# Patient Record
Sex: Male | Born: 1965 | Race: White | Hispanic: No | Marital: Married | Smoking: Never smoker
Health system: Southern US, Community
[De-identification: ages and names within clinical notes are randomized; demographics above are authoritative.]

## PROBLEM LIST (undated history)

## (undated) DIAGNOSIS — M1990 Unspecified osteoarthritis, unspecified site: Secondary | ICD-10-CM

## (undated) DIAGNOSIS — G4733 Obstructive sleep apnea (adult) (pediatric): Secondary | ICD-10-CM

## (undated) DIAGNOSIS — E7800 Pure hypercholesterolemia, unspecified: Secondary | ICD-10-CM

## (undated) DIAGNOSIS — E669 Obesity, unspecified: Secondary | ICD-10-CM

## (undated) DIAGNOSIS — I639 Cerebral infarction, unspecified: Secondary | ICD-10-CM

## (undated) DIAGNOSIS — E039 Hypothyroidism, unspecified: Secondary | ICD-10-CM

## (undated) DIAGNOSIS — R569 Unspecified convulsions: Secondary | ICD-10-CM

## (undated) DIAGNOSIS — N183 Chronic kidney disease, stage 3 (moderate): Secondary | ICD-10-CM

## (undated) DIAGNOSIS — I10 Essential (primary) hypertension: Secondary | ICD-10-CM

## (undated) DIAGNOSIS — M21372 Foot drop, left foot: Principal | ICD-10-CM

## (undated) DIAGNOSIS — G43909 Migraine, unspecified, not intractable, without status migrainosus: Secondary | ICD-10-CM

## (undated) DIAGNOSIS — E1122 Type 2 diabetes mellitus with diabetic chronic kidney disease: Secondary | ICD-10-CM

## (undated) DIAGNOSIS — Z9981 Dependence on supplemental oxygen: Secondary | ICD-10-CM

## (undated) DIAGNOSIS — I509 Heart failure, unspecified: Secondary | ICD-10-CM

## (undated) DIAGNOSIS — Z9989 Dependence on other enabling machines and devices: Secondary | ICD-10-CM

## (undated) DIAGNOSIS — E119 Type 2 diabetes mellitus without complications: Secondary | ICD-10-CM

## (undated) DIAGNOSIS — J189 Pneumonia, unspecified organism: Secondary | ICD-10-CM

## (undated) DIAGNOSIS — I2699 Other pulmonary embolism without acute cor pulmonale: Secondary | ICD-10-CM

## (undated) DIAGNOSIS — E1169 Type 2 diabetes mellitus with other specified complication: Secondary | ICD-10-CM

## (undated) HISTORY — DX: Other pulmonary embolism without acute cor pulmonale: I26.99

## (undated) HISTORY — DX: Pneumonia, unspecified organism: J18.9

## (undated) HISTORY — DX: Foot drop, left foot: M21.372

---

## 2015-02-22 DIAGNOSIS — J189 Pneumonia, unspecified organism: Secondary | ICD-10-CM

## 2015-02-22 HISTORY — DX: Pneumonia, unspecified organism: J18.9

## 2016-06-21 DIAGNOSIS — N183 Chronic kidney disease, stage 3 unspecified: Secondary | ICD-10-CM

## 2016-06-21 DIAGNOSIS — E1122 Type 2 diabetes mellitus with diabetic chronic kidney disease: Secondary | ICD-10-CM

## 2016-06-21 HISTORY — DX: Chronic kidney disease, stage 3 unspecified: N18.30

## 2016-06-21 HISTORY — DX: Chronic kidney disease, stage 3 unspecified: E11.22

## 2016-06-22 DIAGNOSIS — I10 Essential (primary) hypertension: Secondary | ICD-10-CM | POA: Diagnosis not present

## 2016-06-22 DIAGNOSIS — I509 Heart failure, unspecified: Secondary | ICD-10-CM | POA: Diagnosis not present

## 2016-06-22 DIAGNOSIS — N183 Chronic kidney disease, stage 3 (moderate): Secondary | ICD-10-CM | POA: Diagnosis not present

## 2016-06-22 DIAGNOSIS — E119 Type 2 diabetes mellitus without complications: Secondary | ICD-10-CM | POA: Diagnosis not present

## 2016-06-22 DIAGNOSIS — R0609 Other forms of dyspnea: Secondary | ICD-10-CM

## 2016-06-23 DIAGNOSIS — R0609 Other forms of dyspnea: Secondary | ICD-10-CM | POA: Diagnosis not present

## 2016-06-23 DIAGNOSIS — I509 Heart failure, unspecified: Secondary | ICD-10-CM | POA: Diagnosis not present

## 2016-06-23 DIAGNOSIS — E119 Type 2 diabetes mellitus without complications: Secondary | ICD-10-CM

## 2016-06-23 DIAGNOSIS — N183 Chronic kidney disease, stage 3 (moderate): Secondary | ICD-10-CM | POA: Diagnosis not present

## 2016-06-23 DIAGNOSIS — I10 Essential (primary) hypertension: Secondary | ICD-10-CM | POA: Diagnosis not present

## 2016-06-24 DIAGNOSIS — N183 Chronic kidney disease, stage 3 unspecified: Secondary | ICD-10-CM | POA: Insufficient documentation

## 2016-06-24 DIAGNOSIS — I5041 Acute combined systolic (congestive) and diastolic (congestive) heart failure: Secondary | ICD-10-CM | POA: Insufficient documentation

## 2016-06-24 DIAGNOSIS — E119 Type 2 diabetes mellitus without complications: Secondary | ICD-10-CM | POA: Insufficient documentation

## 2016-08-21 DIAGNOSIS — I509 Heart failure, unspecified: Secondary | ICD-10-CM

## 2016-08-21 HISTORY — DX: Heart failure, unspecified: I50.9

## 2016-12-22 HISTORY — PX: CARDIAC CATHETERIZATION: SHX172

## 2016-12-23 DIAGNOSIS — E785 Hyperlipidemia, unspecified: Secondary | ICD-10-CM | POA: Insufficient documentation

## 2016-12-23 DIAGNOSIS — E039 Hypothyroidism, unspecified: Secondary | ICD-10-CM | POA: Insufficient documentation

## 2017-08-14 ENCOUNTER — Ambulatory Visit (INDEPENDENT_AMBULATORY_CARE_PROVIDER_SITE_OTHER): Payer: BLUE CROSS/BLUE SHIELD | Admitting: Emergency Medicine

## 2017-08-14 ENCOUNTER — Encounter: Payer: Self-pay | Admitting: Emergency Medicine

## 2017-08-14 VITALS — BP 122/78 | HR 94 | Ht 71.0 in | Wt 311.0 lb

## 2017-08-14 DIAGNOSIS — R5383 Other fatigue: Secondary | ICD-10-CM

## 2017-08-14 DIAGNOSIS — R0609 Other forms of dyspnea: Secondary | ICD-10-CM

## 2017-08-14 DIAGNOSIS — E662 Morbid (severe) obesity with alveolar hypoventilation: Secondary | ICD-10-CM | POA: Diagnosis not present

## 2017-08-14 DIAGNOSIS — I10 Essential (primary) hypertension: Secondary | ICD-10-CM | POA: Diagnosis not present

## 2017-08-14 DIAGNOSIS — G4733 Obstructive sleep apnea (adult) (pediatric): Secondary | ICD-10-CM

## 2017-08-14 DIAGNOSIS — J9611 Chronic respiratory failure with hypoxia: Secondary | ICD-10-CM | POA: Diagnosis not present

## 2017-08-14 DIAGNOSIS — G4730 Sleep apnea, unspecified: Secondary | ICD-10-CM | POA: Insufficient documentation

## 2017-08-14 NOTE — Patient Instructions (Addendum)
We will arrange for pulmonary function testing in our office CXR today.  Please continue your oxygen at 3L/min  Continue to use your BiPAP mask every night Continue Breo one inhalation daily. Rinse and gargle after using.  Keep albuterol available to use 2 puffs as needed for shortness of breath.  Continue your heart medications as ordered. Follow with Dr Lamonte Sakai next available with full PFT same day.

## 2017-08-14 NOTE — Assessment & Plan Note (Signed)
On 3 L/min

## 2017-08-14 NOTE — Assessment & Plan Note (Signed)
On BiPAP that it sounds like an auto titration mode in which is been managed by his primary care physician.  May need a repeat sleep study to ensure adequate pressures although it sounds like this was done recently.  We will try to get these records.

## 2017-08-14 NOTE — Assessment & Plan Note (Signed)
Multifactorial.  He relates this to a hospitalization that was characterized by dyspnea and bilateral infiltrates approximately 1 year ago.  Unable to determine whether this was the acute inciting event or whether the presentation was actually more subacute and culminated in that hospitalization.  He was treated with antibiotics and also diuretics, prednisone.  Based on his overall condition and his other medical problems including his obesity hyperinflation syndrome, probable pulmonary hypertension, diastolic dysfunction I question whether those infiltrates may have actually been pulmonary edema.  He has a history that would suggest cor pulmonale.  He apparently had spirometry that showed mixed restriction and obstruction and has been tried on Breo with some positive results.  I will continue the [facility] for now, albuterol as needed.  Continue his current supplemental oxygen.  He may ultimately require repeat oxygen titration and an overnight oximetry.  He needs repeat pulmonary function testing to quantify his degree of obstruction.  Continue his current antihypertensive and diuretic medications as ordered.

## 2017-08-14 NOTE — Progress Notes (Signed)
Subjective:    Patient ID: Daniel [name], male    DOB: 12-04-1965, 52 y.o.   MRN: [id number]   HPI 51 year old morbidly obese man, never smoker with a history of obstructive sleep apnea on BiPAP on auto-titration, hypertension, hyperlipidemia, diabetes, CAD defined by cardiac cath in late 2018, managed medically.  He is referred today for continued evaluation of dyspnea and hypoxemia.  He has been on 3 L of oxygen at all times since a hospitalization for possible pneumonia, bilateral pulmonary infiltrates in [facility] 06/2016. He was treated with abx, prednisone at the time. These were apparently improved on repeat CT scanning 08/2016.  V/Q scan done 07/01/2016 was low probably for pulmonary embolism. He has a ventral hernia with some pain, ? Whether this is causing some restrictive component   He presents today for eval of his SOB. He complains of low energy with exertion. He gets very SOB when he bends over. He sleeps through most of the night on his BiPAP. He does fall asleep during the day.   He has been tried empirically on breo, albuterol in the past. He uses the [place] reliably, unsure whether it is helping him. He may get some relief from albuterol. He was started on armodafanil in [place] for low energy, possible narcolepsy??  Review of chart notes shows that he had spirometry performed in 07/28/2016 with an FEV1 2.19 L (60% predicted), FVC 2.78 L (62% predicted), ratio 79%.  Raw diffusion capacity 65% predicted.  Total lung capacity 90% predicted.    Review of Systems  Constitutional: Positive for fatigue. Negative for fever and unexpected weight change.  HENT: Positive for postnasal drip and sneezing. Negative for congestion, dental problem, ear pain, nosebleeds, rhinorrhea, sinus pressure, sore throat and trouble swallowing.   Eyes: Positive for redness and itching.  Respiratory: Positive for shortness of breath. Negative for cough, chest tightness and wheezing.   Cardiovascular:  Positive for leg swelling. Negative for palpitations.  Gastrointestinal: Negative for nausea and vomiting.  Genitourinary: Negative for dysuria.  Musculoskeletal: Negative for joint swelling.  Skin: Negative for rash.  Allergic/Immunologic: Positive for environmental allergies and immunocompromised state. Negative for food allergies.  Neurological: Negative for headaches.  Hematological: Does not bruise/bleed easily.  Psychiatric/Behavioral: Negative for dysphoric mood. The patient is nervous/anxious.    Past Medical History:  Diagnosis Date  . Pneumonia   . Pulmonary embolism ([place])   . Sleep apnea      Family History  Problem Relation Age of Onset  . Alpha-1 antitrypsin deficiency Neg Hx   . Asthma Neg Hx   . Breast cancer Neg Hx   . Stroke Neg Hx   . Colon cancer Neg Hx   . Coronary artery disease Neg Hx   . COPD Neg Hx   . Cystic fibrosis Neg Hx   . Diabetes Neg Hx   . Deep vein thrombosis Neg Hx   . Hypertension Neg Hx   . Hyperlipidemia Neg Hx   . Kidney disease Neg Hx   . Liver disease Neg Hx   . Lung cancer Neg Hx   . Lupus Neg Hx   . Neurofibromatosis Neg Hx   . Osteoporosis Neg Hx   . Ovarian cancer Neg Hx   . Positive PPD/TB Exposure Neg Hx   . Prostate cancer Neg Hx   . Pulmonary embolism Neg Hx   . Pulmonary fibrosis Neg Hx   . Rheum arthritis Neg Hx   . Sarcoidosis Neg Hx   .  Sleep apnea Neg Hx   . Thyroid disease Neg Hx   . Tuberculosis Neg Hx   . Tuberous sclerosis Neg Hx      Social History   Socioeconomic History  . Marital status: Married    Spouse name: Not on file  . Number of children: Not on file  . Years of education: Not on file  . Highest education level: Not on file  Occupational History  . Not on file  Social Needs  . Financial resource strain: Not on file  . Food insecurity:    Worry: Not on file    Inability: Not on file  . Transportation needs:    Medical: Not on file    Non-medical: Not on file  Tobacco Use  . Smoking  status: Never Smoker  . Smokeless tobacco: Former Systems developer    Types: Chew  Substance and Sexual Activity  . Alcohol use: Not on file  . Drug use: Not on file  . Sexual activity: Not on file  Lifestyle  . Physical activity:    Days per week: Not on file    Minutes per session: Not on file  . Stress: Not on file  Relationships  . Social connections:    Talks on phone: Not on file    Gets together: Not on file    Attends religious service: Not on file    Active member of club or organization: Not on file    Attends meetings of clubs or organizations: Not on file    Relationship status: Not on file  . Intimate partner violence:    Fear of current or ex partner: Not on file    Emotionally abused: Not on file    Physically abused: Not on file    Forced sexual activity: Not on file  Other Topics Concern  . Not on file  Social History Narrative  . Not on file  He is worked in the past with hazardous material cleaned up, is also worked on a farm in the past.  He has traveled in the past [place], [place], [place], [place], [place], [place].  No Known Allergies   Outpatient Medications Prior to Visit  Medication Sig Dispense Refill  . albuterol (PROVENTIL HFA) 108 (90 Base) MCG/ACT inhaler Inhale into the lungs.    . Armodafinil 250 MG tablet TAKE 1 TABLET BY MOUTH EVERY DAY    . aspirin EC 81 MG tablet Take by mouth.    . cycloSPORINE (RESTASIS) 0.05 % ophthalmic emulsion INSTILL 1 DROP IN BOTH EYES BID    . fluticasone furoate-vilanterol (BREO ELLIPTA) 100-25 MCG/INH AEPB Inhale into the lungs.    . hydrALAZINE (APRESOLINE) 100 MG tablet TAKE 1 TABLET BY MOUTH THREE TIMES A DAY    . hydrochlorothiazide (HYDRODIURIL) 25 MG tablet Take by mouth.    . insulin regular (HUMULIN R) 100 units/mL injection Inject into the skin.    Marland Kitchen levothyroxine (SYNTHROID, LEVOTHROID) 50 MCG tablet     . lisinopril (PRINIVIL,ZESTRIL) 10 MG tablet Take 10 mg by mouth daily.  11  . metFORMIN  (GLUCOPHAGE) 1000 MG tablet TAKE 1 TABLET BY MOUTH TWICE (2) DAILY  11  . torsemide (DEMADEX) 20 MG tablet TAKE 1 TABLET BY MOUTH EVERY DAY     No facility-administered medications prior to visit.         Objective:   Physical Exam Vitals:   08/14/17 1431  BP: 122/78  Pulse: 94  SpO2: 95%  Weight: (!) 311 lb (  141.1 kg)  Height: 5\' 11"  (1.803 m)   Gen: Pleasant, morbidly obese man, in no distress on nasal cannula oxygen,  normal affect  ENT: No lesions,  mouth clear,  oropharynx clear, no postnasal drip  Neck: No JVD,  very large neck, no stridor  Lungs: No use of accessory muscles, very distant, no wheezing, no crackles  Cardiovascular: RRR, heart sounds normal, no murmur or gallops, trace lower extremity edema  Musculoskeletal: No deformities, no cyanosis or clubbing  Neuro: alert, non focal  Skin: Warm, no lesions or rash    Assessment & Plan:  Sleep apnea On BiPAP that it sounds like an auto titration mode in which is been managed by his primary care physician.  May need a repeat sleep study to ensure adequate pressures although it sounds like this was done recently.  We will try to get these records.  Dyspnea on exertion Multifactorial.  He relates this to a hospitalization that was characterized by dyspnea and bilateral infiltrates approximately 1 year ago.  Unable to determine whether this was the acute inciting event or whether the presentation was actually more subacute and culminated in that hospitalization.  He was treated with antibiotics and also diuretics, prednisone.  Based on his overall condition and his other medical problems including his obesity hyperinflation syndrome, probable pulmonary hypertension, diastolic dysfunction I question whether those infiltrates may have actually been pulmonary edema.  He has a history that would suggest cor pulmonale.  He apparently had spirometry that showed mixed restriction and obstruction and has been tried on Breo with  some positive results.  I will continue the [facility] for now, albuterol as needed.  Continue his current supplemental oxygen.  He may ultimately require repeat oxygen titration and an overnight oximetry.  He needs repeat pulmonary function testing to quantify his degree of obstruction.  Continue his current antihypertensive and diuretic medications as ordered.  Chronic respiratory failure with hypoxia ([place]) On 3 L/min  Baltazar Apo, MD, PhD 08/14/2017, 5:17 PM [facility] [phone] or if no answer [phone]

## 2017-09-15 ENCOUNTER — Ambulatory Visit (INDEPENDENT_AMBULATORY_CARE_PROVIDER_SITE_OTHER): Payer: BLUE CROSS/BLUE SHIELD | Admitting: Emergency Medicine

## 2017-09-15 ENCOUNTER — Encounter: Payer: Self-pay | Admitting: Emergency Medicine

## 2017-09-15 DIAGNOSIS — J9611 Chronic respiratory failure with hypoxia: Secondary | ICD-10-CM | POA: Diagnosis not present

## 2017-09-15 DIAGNOSIS — E662 Morbid (severe) obesity with alveolar hypoventilation: Secondary | ICD-10-CM | POA: Diagnosis not present

## 2017-09-15 DIAGNOSIS — R0609 Other forms of dyspnea: Secondary | ICD-10-CM

## 2017-09-15 LAB — PULMONARY FUNCTION TEST
DL/VA % PRED: 130 %
DL/VA: 5.96 ml/min/mmHg/L
DLCO UNC % PRED: 103 %
DLCO unc: 32.21 ml/min/mmHg
FEF 25-75 POST: 3.87 L/s
FEF 25-75 PRE: 3.51 L/s
FEF2575-%CHANGE-POST: 10 %
FEF2575-%PRED-POST: 120 %
FEF2575-%Pred-Pre: 109 %
FEV1-%Change-Post: 8 %
FEV1-%PRED-POST: 82 %
FEV1-%Pred-Pre: 76 %
FEV1-PRE: 2.81 L
FEV1-Post: 3.05 L
FEV1FVC-%Change-Post: 0 %
FEV1FVC-%PRED-PRE: 110 %
FEV6-%Change-Post: 7 %
FEV6-%PRED-POST: 76 %
FEV6-%PRED-PRE: 71 %
FEV6-POST: 3.55 L
FEV6-Pre: 3.3 L
FEV6FVC-%PRED-POST: 104 %
FEV6FVC-%Pred-Pre: 104 %
FVC-%CHANGE-POST: 7 %
FVC-%PRED-POST: 73 %
FVC-%Pred-Pre: 68 %
FVC-Post: 3.55 L
FVC-Pre: 3.3 L
POST FEV6/FVC RATIO: 100 %
PRE FEV1/FVC RATIO: 85 %
Post FEV1/FVC ratio: 86 %
Pre FEV6/FVC Ratio: 100 %
RV % pred: 124 %
RV: 2.55 L
TLC % pred: 87 %
TLC: 5.92 L

## 2017-09-15 NOTE — Progress Notes (Signed)
Patient completed full PFT today. 

## 2017-09-15 NOTE — Patient Instructions (Addendum)
Congratulations on your weight loss.  Continue to try to work on slow steady weight loss as you are able.  This will help your breathing. Continue to use her BiPAP mask every night reliably. Continue to use your Demadex as ordered. Your pulmonary function testing does show some evidence for some asthma.  Continue Breo 1 inhalation once daily.  Rinse and gargle after using. Walking oximetry today on room air shows that your oxygen level stays in the safe range with exertion. We can consider stopping your oxygen.  Follow with Dr Lamonte Sakai in 6 months or sooner if you have any problems

## 2017-09-15 NOTE — Assessment & Plan Note (Signed)
Identified when he was hospitalized with B infiltrates. Repeat his walking oximetry today. He may be able to come off the O2.

## 2017-09-15 NOTE — Progress Notes (Signed)
Subjective:    Patient ID: Daniel [name], male    DOB: 1965/09/20, 52 y.o.   MRN: [id number]   HPI 52 year old morbidly obese man, never smoker with a history of obstructive sleep apnea on BiPAP on auto-titration, hypertension, hyperlipidemia, diabetes, CAD defined by cardiac cath in late 2018, managed medically.  He is referred today for continued evaluation of dyspnea and hypoxemia.  He has been on 3 L of oxygen at all times since a hospitalization for possible pneumonia, bilateral pulmonary infiltrates in [facility] 06/2016. He was treated with abx, prednisone at the time. These were apparently improved on repeat CT scanning 08/2016.  V/Q scan done 07/01/2016 was low probably for pulmonary embolism. He has a ventral hernia with some pain, ? Whether this is causing some restrictive component   He presents today for eval of his SOB. He complains of low energy with exertion. He gets very SOB when he bends over. He sleeps through most of the night on his BiPAP. He does fall asleep during the day.   He has been tried empirically on breo, albuterol in the past. He uses the [place] reliably, unsure whether it is helping him. He may get some relief from albuterol. He was started on armodafanil in [place] for low energy, possible narcolepsy??  Review of chart notes shows that he had spirometry performed in 07/28/2016 with an FEV1 2.19 L (60% predicted), FVC 2.78 L (62% predicted), ratio 79%.  Raw diffusion capacity 65% predicted.  Total lung capacity 90% predicted.  ROV 09/15/17 --this is a follow-up visit for 52 year old obese man, never smoker with obstructive sleep apnea that is managed with BiPAP.  He is under evaluation for multifactorial dyspnea.  Clinical history has been suggestive of possible secondary pulmonary hypertension with right heart dysfunction.  He is been on Breo for possible obstructive lung disease.  We repeated his pulmonary function testing today which shows evidence for mixed obstruction  and restriction.  His FEV1 is moderately reduced at 2.81 L or 76% of predicted.  This is a significant improvement compared with June 2018.  His diffusion capacity today was normal.  His lung volumes were normal as well, probably reflects pseudonormalization in the setting of coexisting obstruction. He has lost about 40 lbs. He uses his BiPAP reliably. He remains on Breo, has been on about 4-6 months. He uses albuterol prn - does seem to help his dyspnea.      Review of Systems  Constitutional: Positive for fatigue. Negative for fever and unexpected weight change.  HENT: Positive for postnasal drip and sneezing. Negative for congestion, dental problem, ear pain, nosebleeds, rhinorrhea, sinus pressure, sore throat and trouble swallowing.   Eyes: Positive for redness and itching.  Respiratory: Positive for shortness of breath. Negative for cough, chest tightness and wheezing.   Cardiovascular: Positive for leg swelling. Negative for palpitations.  Gastrointestinal: Negative for nausea and vomiting.  Genitourinary: Negative for dysuria.  Musculoskeletal: Negative for joint swelling.  Skin: Negative for rash.  Allergic/Immunologic: Positive for environmental allergies and immunocompromised state. Negative for food allergies.  Neurological: Negative for headaches.  Hematological: Does not bruise/bleed easily.  Psychiatric/Behavioral: Negative for dysphoric mood. The patient is nervous/anxious.    Past Medical History:  Diagnosis Date  . Pneumonia   . Pulmonary embolism ([place])   . Sleep apnea      Family History  Problem Relation Age of Onset  . Alpha-1 antitrypsin deficiency Neg Hx   . Asthma Neg Hx   . Breast  cancer Neg Hx   . Stroke Neg Hx   . Colon cancer Neg Hx   . Coronary artery disease Neg Hx   . COPD Neg Hx   . Cystic fibrosis Neg Hx   . Diabetes Neg Hx   . Deep vein thrombosis Neg Hx   . Hypertension Neg Hx   . Hyperlipidemia Neg Hx   . Kidney disease Neg Hx   . Liver  disease Neg Hx   . Lung cancer Neg Hx   . Lupus Neg Hx   . Neurofibromatosis Neg Hx   . Osteoporosis Neg Hx   . Ovarian cancer Neg Hx   . Positive PPD/TB Exposure Neg Hx   . Prostate cancer Neg Hx   . Pulmonary embolism Neg Hx   . Pulmonary fibrosis Neg Hx   . Rheum arthritis Neg Hx   . Sarcoidosis Neg Hx   . Sleep apnea Neg Hx   . Thyroid disease Neg Hx   . Tuberculosis Neg Hx   . Tuberous sclerosis Neg Hx      Social History   Socioeconomic History  . Marital status: Married    Spouse name: Not on file  . Number of children: Not on file  . Years of education: Not on file  . Highest education level: Not on file  Occupational History  . Not on file  Social Needs  . Financial resource strain: Not on file  . Food insecurity:    Worry: Not on file    Inability: Not on file  . Transportation needs:    Medical: Not on file    Non-medical: Not on file  Tobacco Use  . Smoking status: Never Smoker  . Smokeless tobacco: Former Systems developer    Types: Chew  Substance and Sexual Activity  . Alcohol use: Never    Frequency: Never  . Drug use: Never  . Sexual activity: Not on file  Lifestyle  . Physical activity:    Days per week: Not on file    Minutes per session: Not on file  . Stress: Not on file  Relationships  . Social connections:    Talks on phone: Not on file    Gets together: Not on file    Attends religious service: Not on file    Active member of club or organization: Not on file    Attends meetings of clubs or organizations: Not on file    Relationship status: Not on file  . Intimate partner violence:    Fear of current or ex partner: Not on file    Emotionally abused: Not on file    Physically abused: Not on file    Forced sexual activity: Not on file  Other Topics Concern  . Not on file  Social History Narrative  . Not on file  He is worked in the past with hazardous material cleaned up, is also worked on a farm in the past.  He has traveled in the past  [place], [place], [place], [place], [place], [place].  No Known Allergies   Outpatient Medications Prior to Visit  Medication Sig Dispense Refill  . albuterol (PROVENTIL HFA) 108 (90 Base) MCG/ACT inhaler Inhale into the lungs.    . Armodafinil 250 MG tablet TAKE 1 TABLET BY MOUTH EVERY DAY    . aspirin EC 81 MG tablet Take by mouth.    . cycloSPORINE (RESTASIS) 0.05 % ophthalmic emulsion INSTILL 1 DROP IN BOTH EYES BID    . fluticasone furoate-vilanterol (  BREO ELLIPTA) 100-25 MCG/INH AEPB Inhale into the lungs.    . hydrALAZINE (APRESOLINE) 100 MG tablet TAKE 1 TABLET BY MOUTH THREE TIMES A DAY    . hydrochlorothiazide (HYDRODIURIL) 25 MG tablet Take by mouth.    . insulin regular (HUMULIN R) 100 units/mL injection Inject into the skin.    Marland Kitchen levothyroxine (SYNTHROID, LEVOTHROID) 50 MCG tablet     . lisinopril (PRINIVIL,ZESTRIL) 10 MG tablet Take 10 mg by mouth daily.  11  . metFORMIN (GLUCOPHAGE) 1000 MG tablet TAKE 1 TABLET BY MOUTH TWICE (2) DAILY  11  . torsemide (DEMADEX) 20 MG tablet TAKE 1 TABLET BY MOUTH EVERY DAY     No facility-administered medications prior to visit.         Objective:   Physical Exam Vitals:   09/15/17 1118  BP: 116/70  Pulse: 94  SpO2: 95%  Weight: (!) 306 lb 9.6 oz (139.1 kg)  Height: 5\' 9"  (1.753 m)   Gen: Pleasant, morbidly obese man, in no distress on nasal cannula oxygen,  normal affect  ENT: No lesions,  mouth clear,  oropharynx clear, no postnasal drip  Neck: No JVD,  very large neck, no stridor  Lungs: No use of accessory muscles, very distant, no wheezing, no crackles  Cardiovascular: RRR, heart sounds normal, no murmur or gallops, trace lower extremity edema  Musculoskeletal: No deformities, no cyanosis or clubbing  Neuro: alert, non focal  Skin: Warm, no lesions or rash    Assessment & Plan:  Dyspnea on exertion Multifactorial dyspnea due to restriction from obesity, some degree of obstruction probably asthma,  OSA/OHS, 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  We will try to manage his restrictive lung disease, OHS, obstructive lung disease.  We will perform walking oximetry today to confirm exertional desaturation.  Titrate his oxygen accordingly.  He is better compensated with regard to volume status at this time.  Continue his current torsemide.  Talked to him today about slow steady weight loss.  Obesity hypoventilation syndrome (HCC) On stable BiPAP, managed through his PCP  Chronic respiratory failure with hypoxia ([place]) Identified when he was hospitalized with B infiltrates. Repeat his walking oximetry today. He may be able to come off the O2.   Baltazar Apo, MD, PhD 09/15/2017, 12:47 PM [facility] [phone] or if no answer [phone]

## 2017-09-15 NOTE — Addendum Note (Signed)
Addended by: Lorretta Harp on: 09/15/2017 12:53 PM   Modules accepted: Orders

## 2017-09-15 NOTE — Assessment & Plan Note (Signed)
On stable BiPAP, managed through his PCP

## 2017-09-15 NOTE — Assessment & Plan Note (Signed)
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  We will try to manage his restrictive lung disease, OHS, obstructive lung disease.  We will perform walking oximetry today to confirm exertional desaturation.  Titrate his oxygen accordingly.  He is better compensated with regard to volume status at this time.  Continue his current torsemide.  Talked to him today about slow steady weight loss.

## 2017-10-24 DIAGNOSIS — I10 Essential (primary) hypertension: Secondary | ICD-10-CM | POA: Diagnosis not present

## 2017-10-24 DIAGNOSIS — R0609 Other forms of dyspnea: Secondary | ICD-10-CM

## 2017-10-24 DIAGNOSIS — N183 Chronic kidney disease, stage 3 (moderate): Secondary | ICD-10-CM

## 2017-10-24 DIAGNOSIS — I509 Heart failure, unspecified: Secondary | ICD-10-CM

## 2017-10-24 DIAGNOSIS — E119 Type 2 diabetes mellitus without complications: Secondary | ICD-10-CM | POA: Diagnosis not present

## 2017-10-24 DIAGNOSIS — R739 Hyperglycemia, unspecified: Secondary | ICD-10-CM | POA: Diagnosis not present

## 2017-10-24 DIAGNOSIS — R569 Unspecified convulsions: Secondary | ICD-10-CM | POA: Diagnosis not present

## 2017-10-25 DIAGNOSIS — R569 Unspecified convulsions: Secondary | ICD-10-CM | POA: Diagnosis not present

## 2017-10-25 DIAGNOSIS — E119 Type 2 diabetes mellitus without complications: Secondary | ICD-10-CM | POA: Diagnosis not present

## 2017-10-25 DIAGNOSIS — R739 Hyperglycemia, unspecified: Secondary | ICD-10-CM | POA: Diagnosis not present

## 2017-10-25 DIAGNOSIS — I10 Essential (primary) hypertension: Secondary | ICD-10-CM | POA: Diagnosis not present

## 2017-10-26 DIAGNOSIS — R739 Hyperglycemia, unspecified: Secondary | ICD-10-CM | POA: Diagnosis not present

## 2017-10-26 DIAGNOSIS — E119 Type 2 diabetes mellitus without complications: Secondary | ICD-10-CM | POA: Diagnosis not present

## 2017-10-26 DIAGNOSIS — R569 Unspecified convulsions: Secondary | ICD-10-CM | POA: Diagnosis not present

## 2017-10-26 DIAGNOSIS — I10 Essential (primary) hypertension: Secondary | ICD-10-CM | POA: Diagnosis not present

## 2017-10-27 ENCOUNTER — Inpatient Hospital Stay (HOSPITAL_COMMUNITY): Payer: Medicaid Other

## 2017-10-27 ENCOUNTER — Inpatient Hospital Stay (HOSPITAL_COMMUNITY)
Admission: AD | Admit: 2017-10-27 | Discharge: 2017-11-22 | DRG: 100 | Disposition: A | Discharge status: Rehabilitation Facility | Payer: Medicaid Other | Source: Other Acute Inpatient Hospital | Attending: Internal Medicine | Admitting: Internal Medicine

## 2017-10-27 DIAGNOSIS — R471 Dysarthria and anarthria: Secondary | ICD-10-CM | POA: Diagnosis present

## 2017-10-27 DIAGNOSIS — E1169 Type 2 diabetes mellitus with other specified complication: Secondary | ICD-10-CM | POA: Diagnosis not present

## 2017-10-27 DIAGNOSIS — M79672 Pain in left foot: Secondary | ICD-10-CM | POA: Diagnosis present

## 2017-10-27 DIAGNOSIS — Z9114 Patient's other noncompliance with medication regimen: Secondary | ICD-10-CM

## 2017-10-27 DIAGNOSIS — E785 Hyperlipidemia, unspecified: Secondary | ICD-10-CM | POA: Diagnosis present

## 2017-10-27 DIAGNOSIS — J9621 Acute and chronic respiratory failure with hypoxia: Secondary | ICD-10-CM | POA: Diagnosis present

## 2017-10-27 DIAGNOSIS — E119 Type 2 diabetes mellitus without complications: Secondary | ICD-10-CM

## 2017-10-27 DIAGNOSIS — G4733 Obstructive sleep apnea (adult) (pediatric): Secondary | ICD-10-CM | POA: Diagnosis not present

## 2017-10-27 DIAGNOSIS — R339 Retention of urine, unspecified: Secondary | ICD-10-CM | POA: Diagnosis not present

## 2017-10-27 DIAGNOSIS — Z789 Other specified health status: Secondary | ICD-10-CM

## 2017-10-27 DIAGNOSIS — E039 Hypothyroidism, unspecified: Secondary | ICD-10-CM | POA: Diagnosis present

## 2017-10-27 DIAGNOSIS — J988 Other specified respiratory disorders: Secondary | ICD-10-CM | POA: Diagnosis not present

## 2017-10-27 DIAGNOSIS — I5032 Chronic diastolic (congestive) heart failure: Secondary | ICD-10-CM | POA: Diagnosis present

## 2017-10-27 DIAGNOSIS — N183 Chronic kidney disease, stage 3 (moderate): Secondary | ICD-10-CM | POA: Diagnosis present

## 2017-10-27 DIAGNOSIS — E870 Hyperosmolality and hypernatremia: Secondary | ICD-10-CM | POA: Diagnosis not present

## 2017-10-27 DIAGNOSIS — E1122 Type 2 diabetes mellitus with diabetic chronic kidney disease: Secondary | ICD-10-CM | POA: Diagnosis present

## 2017-10-27 DIAGNOSIS — Z23 Encounter for immunization: Secondary | ICD-10-CM | POA: Diagnosis not present

## 2017-10-27 DIAGNOSIS — E1140 Type 2 diabetes mellitus with diabetic neuropathy, unspecified: Secondary | ICD-10-CM | POA: Diagnosis present

## 2017-10-27 DIAGNOSIS — E876 Hypokalemia: Secondary | ICD-10-CM | POA: Diagnosis not present

## 2017-10-27 DIAGNOSIS — Z6841 Body Mass Index (BMI) 40.0 and over, adult: Secondary | ICD-10-CM | POA: Diagnosis not present

## 2017-10-27 DIAGNOSIS — E662 Morbid (severe) obesity with alveolar hypoventilation: Secondary | ICD-10-CM | POA: Diagnosis present

## 2017-10-27 DIAGNOSIS — I471 Supraventricular tachycardia: Secondary | ICD-10-CM | POA: Diagnosis present

## 2017-10-27 DIAGNOSIS — T85598A Other mechanical complication of other gastrointestinal prosthetic devices, implants and grafts, initial encounter: Secondary | ICD-10-CM

## 2017-10-27 DIAGNOSIS — I10 Essential (primary) hypertension: Secondary | ICD-10-CM | POA: Diagnosis not present

## 2017-10-27 DIAGNOSIS — L299 Pruritus, unspecified: Secondary | ICD-10-CM | POA: Diagnosis not present

## 2017-10-27 DIAGNOSIS — J1100 Influenza due to unidentified influenza virus with unspecified type of pneumonia: Secondary | ICD-10-CM | POA: Diagnosis not present

## 2017-10-27 DIAGNOSIS — R197 Diarrhea, unspecified: Secondary | ICD-10-CM | POA: Diagnosis not present

## 2017-10-27 DIAGNOSIS — G40901 Epilepsy, unspecified, not intractable, with status epilepticus: Secondary | ICD-10-CM | POA: Diagnosis not present

## 2017-10-27 DIAGNOSIS — R404 Transient alteration of awareness: Secondary | ICD-10-CM | POA: Insufficient documentation

## 2017-10-27 DIAGNOSIS — Z86711 Personal history of pulmonary embolism: Secondary | ICD-10-CM

## 2017-10-27 DIAGNOSIS — J9600 Acute respiratory failure, unspecified whether with hypoxia or hypercapnia: Secondary | ICD-10-CM

## 2017-10-27 DIAGNOSIS — E669 Obesity, unspecified: Secondary | ICD-10-CM | POA: Diagnosis not present

## 2017-10-27 DIAGNOSIS — K0889 Other specified disorders of teeth and supporting structures: Secondary | ICD-10-CM | POA: Diagnosis not present

## 2017-10-27 DIAGNOSIS — N189 Chronic kidney disease, unspecified: Secondary | ICD-10-CM | POA: Diagnosis not present

## 2017-10-27 DIAGNOSIS — I130 Hypertensive heart and chronic kidney disease with heart failure and stage 1 through stage 4 chronic kidney disease, or unspecified chronic kidney disease: Secondary | ICD-10-CM | POA: Diagnosis present

## 2017-10-27 DIAGNOSIS — E1165 Type 2 diabetes mellitus with hyperglycemia: Secondary | ICD-10-CM | POA: Diagnosis present

## 2017-10-27 DIAGNOSIS — M7989 Other specified soft tissue disorders: Secondary | ICD-10-CM | POA: Diagnosis not present

## 2017-10-27 DIAGNOSIS — I2510 Atherosclerotic heart disease of native coronary artery without angina pectoris: Secondary | ICD-10-CM | POA: Diagnosis present

## 2017-10-27 DIAGNOSIS — Z794 Long term (current) use of insulin: Secondary | ICD-10-CM

## 2017-10-27 DIAGNOSIS — Z888 Allergy status to other drugs, medicaments and biological substances status: Secondary | ICD-10-CM | POA: Diagnosis not present

## 2017-10-27 DIAGNOSIS — M6281 Muscle weakness (generalized): Secondary | ICD-10-CM | POA: Diagnosis not present

## 2017-10-27 DIAGNOSIS — N182 Chronic kidney disease, stage 2 (mild): Secondary | ICD-10-CM | POA: Diagnosis not present

## 2017-10-27 DIAGNOSIS — Y95 Nosocomial condition: Secondary | ICD-10-CM | POA: Diagnosis present

## 2017-10-27 DIAGNOSIS — D62 Acute posthemorrhagic anemia: Secondary | ICD-10-CM

## 2017-10-27 DIAGNOSIS — N179 Acute kidney failure, unspecified: Secondary | ICD-10-CM

## 2017-10-27 DIAGNOSIS — R7309 Other abnormal glucose: Secondary | ICD-10-CM | POA: Diagnosis not present

## 2017-10-27 DIAGNOSIS — G4730 Sleep apnea, unspecified: Secondary | ICD-10-CM | POA: Diagnosis present

## 2017-10-27 DIAGNOSIS — Z9119 Patient's noncompliance with other medical treatment and regimen: Secondary | ICD-10-CM | POA: Diagnosis not present

## 2017-10-27 DIAGNOSIS — G40909 Epilepsy, unspecified, not intractable, without status epilepticus: Secondary | ICD-10-CM | POA: Diagnosis not present

## 2017-10-27 DIAGNOSIS — Z452 Encounter for adjustment and management of vascular access device: Secondary | ICD-10-CM

## 2017-10-27 DIAGNOSIS — Z87891 Personal history of nicotine dependence: Secondary | ICD-10-CM | POA: Diagnosis not present

## 2017-10-27 DIAGNOSIS — R443 Hallucinations, unspecified: Secondary | ICD-10-CM | POA: Diagnosis present

## 2017-10-27 DIAGNOSIS — J9601 Acute respiratory failure with hypoxia: Secondary | ICD-10-CM | POA: Diagnosis not present

## 2017-10-27 DIAGNOSIS — J14 Pneumonia due to Hemophilus influenzae: Secondary | ICD-10-CM | POA: Diagnosis present

## 2017-10-27 DIAGNOSIS — E871 Hypo-osmolality and hyponatremia: Secondary | ICD-10-CM | POA: Diagnosis present

## 2017-10-27 DIAGNOSIS — G9341 Metabolic encephalopathy: Secondary | ICD-10-CM | POA: Diagnosis present

## 2017-10-27 DIAGNOSIS — E8770 Fluid overload, unspecified: Secondary | ICD-10-CM | POA: Diagnosis not present

## 2017-10-27 DIAGNOSIS — R509 Fever, unspecified: Secondary | ICD-10-CM | POA: Diagnosis not present

## 2017-10-27 DIAGNOSIS — G40911 Epilepsy, unspecified, intractable, with status epilepticus: Principal | ICD-10-CM | POA: Diagnosis present

## 2017-10-27 DIAGNOSIS — Z9181 History of falling: Secondary | ICD-10-CM | POA: Diagnosis not present

## 2017-10-27 DIAGNOSIS — E8779 Other fluid overload: Secondary | ICD-10-CM | POA: Diagnosis not present

## 2017-10-27 DIAGNOSIS — M79671 Pain in right foot: Secondary | ICD-10-CM | POA: Diagnosis present

## 2017-10-27 DIAGNOSIS — E1142 Type 2 diabetes mellitus with diabetic polyneuropathy: Secondary | ICD-10-CM | POA: Diagnosis present

## 2017-10-27 DIAGNOSIS — H5509 Other forms of nystagmus: Secondary | ICD-10-CM | POA: Diagnosis present

## 2017-10-27 DIAGNOSIS — J449 Chronic obstructive pulmonary disease, unspecified: Secondary | ICD-10-CM | POA: Diagnosis present

## 2017-10-27 DIAGNOSIS — Z79899 Other long term (current) drug therapy: Secondary | ICD-10-CM

## 2017-10-27 DIAGNOSIS — Z9911 Dependence on respirator [ventilator] status: Secondary | ICD-10-CM

## 2017-10-27 DIAGNOSIS — I808 Phlebitis and thrombophlebitis of other sites: Secondary | ICD-10-CM | POA: Diagnosis not present

## 2017-10-27 DIAGNOSIS — J189 Pneumonia, unspecified organism: Secondary | ICD-10-CM | POA: Diagnosis not present

## 2017-10-27 DIAGNOSIS — R0989 Other specified symptoms and signs involving the circulatory and respiratory systems: Secondary | ICD-10-CM

## 2017-10-27 DIAGNOSIS — J9690 Respiratory failure, unspecified, unspecified whether with hypoxia or hypercapnia: Secondary | ICD-10-CM

## 2017-10-27 DIAGNOSIS — R5381 Other malaise: Secondary | ICD-10-CM | POA: Diagnosis not present

## 2017-10-27 DIAGNOSIS — I509 Heart failure, unspecified: Secondary | ICD-10-CM

## 2017-10-27 DIAGNOSIS — Z978 Presence of other specified devices: Secondary | ICD-10-CM

## 2017-10-27 DIAGNOSIS — R569 Unspecified convulsions: Secondary | ICD-10-CM | POA: Diagnosis present

## 2017-10-27 DIAGNOSIS — R739 Hyperglycemia, unspecified: Secondary | ICD-10-CM

## 2017-10-27 DIAGNOSIS — Z7982 Long term (current) use of aspirin: Secondary | ICD-10-CM

## 2017-10-27 DIAGNOSIS — R609 Edema, unspecified: Secondary | ICD-10-CM | POA: Diagnosis not present

## 2017-10-27 DIAGNOSIS — Z79890 Hormone replacement therapy (postmenopausal): Secondary | ICD-10-CM

## 2017-10-27 DIAGNOSIS — I161 Hypertensive emergency: Secondary | ICD-10-CM | POA: Diagnosis present

## 2017-10-27 DIAGNOSIS — G479 Sleep disorder, unspecified: Secondary | ICD-10-CM | POA: Diagnosis not present

## 2017-10-27 DIAGNOSIS — Z91199 Patient's noncompliance with other medical treatment and regimen due to unspecified reason: Secondary | ICD-10-CM

## 2017-10-27 DIAGNOSIS — R1312 Dysphagia, oropharyngeal phase: Secondary | ICD-10-CM | POA: Diagnosis present

## 2017-10-27 HISTORY — DX: Type 2 diabetes mellitus with other specified complication: E11.69

## 2017-10-27 HISTORY — DX: Obstructive sleep apnea (adult) (pediatric): G47.33

## 2017-10-27 HISTORY — DX: Chronic kidney disease, stage 3 (moderate): N18.3

## 2017-10-27 HISTORY — DX: Hypothyroidism, unspecified: E03.9

## 2017-10-27 HISTORY — DX: Morbid (severe) obesity due to excess calories: E66.01

## 2017-10-27 HISTORY — DX: Dependence on supplemental oxygen: Z99.81

## 2017-10-27 HISTORY — DX: Type 2 diabetes mellitus without complications: E11.9

## 2017-10-27 HISTORY — DX: Dependence on other enabling machines and devices: Z99.89

## 2017-10-27 HISTORY — DX: Type 2 diabetes mellitus with other specified complication: E66.9

## 2017-10-27 HISTORY — DX: Migraine, unspecified, not intractable, without status migrainosus: G43.909

## 2017-10-27 HISTORY — DX: Heart failure, unspecified: I50.9

## 2017-10-27 HISTORY — DX: Essential (primary) hypertension: I10

## 2017-10-27 HISTORY — DX: Pure hypercholesterolemia, unspecified: E78.00

## 2017-10-27 HISTORY — DX: Unspecified convulsions: R56.9

## 2017-10-27 HISTORY — DX: Type 2 diabetes mellitus with diabetic chronic kidney disease: E11.22

## 2017-10-27 HISTORY — DX: Unspecified osteoarthritis, unspecified site: M19.90

## 2017-10-27 HISTORY — DX: Cerebral infarction, unspecified: I63.9

## 2017-10-27 LAB — CBC WITH DIFFERENTIAL/PLATELET
Abs Immature Granulocytes: 0.1 10*3/uL (ref 0.0–0.1)
Basophils Absolute: 0 10*3/uL (ref 0.0–0.1)
Basophils Relative: 0 %
Eosinophils Absolute: 0.3 10*3/uL (ref 0.0–0.7)
Eosinophils Relative: 4 %
HCT: 42.7 % (ref 39.0–52.0)
Hemoglobin: 13.6 g/dL (ref 13.0–17.0)
Immature Granulocytes: 2 %
Lymphocytes Relative: 21 %
Lymphs Abs: 1.6 10*3/uL (ref 0.7–4.0)
MCH: 28.2 pg (ref 26.0–34.0)
MCHC: 31.9 g/dL (ref 30.0–36.0)
MCV: 88.4 fL (ref 78.0–100.0)
Monocytes Absolute: 0.5 10*3/uL (ref 0.1–1.0)
Monocytes Relative: 6 %
Neutro Abs: 5.2 10*3/uL (ref 1.7–7.7)
Neutrophils Relative %: 67 %
Platelets: 291 10*3/uL (ref 150–400)
RBC: 4.83 MIL/uL (ref 4.22–5.81)
RDW: 14.3 % (ref 11.5–15.5)
WBC: 7.7 10*3/uL (ref 4.0–10.5)

## 2017-10-27 LAB — C-REACTIVE PROTEIN

## 2017-10-27 LAB — COMPREHENSIVE METABOLIC PANEL
ALT: 25 U/L (ref 0–44)
AST: 23 U/L (ref 15–41)
Albumin: 2.7 g/dL — ABNORMAL LOW (ref 3.5–5.0)
Alkaline Phosphatase: 59 U/L (ref 38–126)
Anion gap: 7 (ref 5–15)
BUN: 29 mg/dL — ABNORMAL HIGH (ref 6–20)
CO2: 26 mmol/L (ref 22–32)
Calcium: 8.5 mg/dL — ABNORMAL LOW (ref 8.9–10.3)
Chloride: 108 mmol/L (ref 98–111)
Creatinine, Ser: 1.59 mg/dL — ABNORMAL HIGH (ref 0.61–1.24)
GFR calc Af Amer: 56 mL/min — ABNORMAL LOW (ref >60)
GFR calc non Af Amer: 48 mL/min — ABNORMAL LOW (ref >60)
Glucose, Bld: 195 mg/dL — ABNORMAL HIGH (ref 70–99)
Potassium: 4.5 mmol/L (ref 3.5–5.1)
Sodium: 141 mmol/L (ref 135–145)
Total Bilirubin: 0.7 mg/dL (ref 0.3–1.2)
Total Protein: 5.8 g/dL — ABNORMAL LOW (ref 6.5–8.1)

## 2017-10-27 LAB — GLUCOSE, CAPILLARY
Glucose-Capillary: 190 mg/dL — ABNORMAL HIGH (ref 70–99)
Glucose-Capillary: 163 mg/dL — ABNORMAL HIGH (ref 70–99)

## 2017-10-27 LAB — SEDIMENTATION RATE: SED RATE: 34 mm/h — AB (ref 0–16)

## 2017-10-27 LAB — TSH: TSH: 0.721 u[IU]/mL (ref 0.350–4.500)

## 2017-10-27 LAB — PHOSPHORUS: Phosphorus: 4.9 mg/dL — ABNORMAL HIGH (ref 2.5–4.6)

## 2017-10-27 LAB — MAGNESIUM: Magnesium: 2.2 mg/dL (ref 1.7–2.4)

## 2017-10-27 MED ORDER — HYDRALAZINE HCL 25 MG PO TABS
25.0000 mg | ORAL_TABLET | Freq: Four times a day (QID) | ORAL | Status: DC
  Administered 2017-10-27 – 2017-11-06 (×27): 25 mg via ORAL
  Filled 2017-10-27 (×31): qty 1

## 2017-10-27 MED ORDER — ARMODAFINIL 250 MG PO TABS: 1.0000 | ORAL_TABLET | Freq: Every day | ORAL | Status: DC

## 2017-10-27 MED ORDER — MONTELUKAST SODIUM 10 MG PO TABS
10.0000 mg | ORAL_TABLET | Freq: Every day | ORAL | Status: DC
  Administered 2017-10-29: 10 mg via ORAL
  Filled 2017-10-27 (×3): qty 1

## 2017-10-27 MED ORDER — INSULIN ASPART 100 UNIT/ML ~~LOC~~ SOLN: 0.0000 [IU] | Freq: Every day | SUBCUTANEOUS | Status: DC

## 2017-10-27 MED ORDER — SODIUM CHLORIDE 0.9 % IV SOLN
200.0000 mg | Freq: Two times a day (BID) | INTRAVENOUS | Status: DC
  Filled 2017-10-27: qty 20

## 2017-10-27 MED ORDER — INSULIN ASPART 100 UNIT/ML ~~LOC~~ SOLN
0.0000 [IU] | Freq: Three times a day (TID) | SUBCUTANEOUS | Status: DC
  Administered 2017-10-27 – 2017-10-28 (×2): 4 [IU] via SUBCUTANEOUS

## 2017-10-27 MED ORDER — DILTIAZEM HCL ER COATED BEADS 240 MG PO CP24: 240.0000 mg | ORAL_CAPSULE | Freq: Every day | ORAL | Status: DC

## 2017-10-27 MED ORDER — ALBUTEROL SULFATE (2.5 MG/3ML) 0.083% IN NEBU
3.0000 mL | INHALATION_SOLUTION | RESPIRATORY_TRACT | Status: DC | PRN
  Administered 2017-11-18: 3 mL via RESPIRATORY_TRACT
  Filled 2017-10-27: qty 3

## 2017-10-27 MED ORDER — MODAFINIL 100 MG PO TABS
200.0000 mg | ORAL_TABLET | Freq: Every day | ORAL | Status: DC
  Administered 2017-10-28: 200 mg via ORAL
  Filled 2017-10-27: qty 2

## 2017-10-27 MED ORDER — HYDRALAZINE HCL 100 MG PO TABS: 100.0000 mg | ORAL_TABLET | Freq: Three times a day (TID) | ORAL | Status: DC

## 2017-10-27 MED ORDER — SODIUM CHLORIDE 0.9 % IV SOLN
INTRAVENOUS | Status: DC
  Administered 2017-10-27 – 2017-10-29 (×4): via INTRAVENOUS

## 2017-10-27 MED ORDER — TORSEMIDE 20 MG PO TABS
20.0000 mg | ORAL_TABLET | Freq: Every day | ORAL | Status: DC
  Administered 2017-10-27 – 2017-10-30 (×3): 20 mg via ORAL
  Filled 2017-10-27 (×5): qty 1

## 2017-10-27 MED ORDER — INSULIN GLARGINE 100 UNIT/ML ~~LOC~~ SOLN
100.0000 [IU] | Freq: Every day | SUBCUTANEOUS | Status: DC
  Filled 2017-10-27: qty 1

## 2017-10-27 MED ORDER — LISINOPRIL 10 MG PO TABS
10.0000 mg | ORAL_TABLET | Freq: Every day | ORAL | Status: DC
  Administered 2017-10-27 – 2017-11-01 (×5): 10 mg via ORAL
  Filled 2017-10-27 (×5): qty 1

## 2017-10-27 MED ORDER — ASPIRIN EC 81 MG PO TBEC
81.0000 mg | DELAYED_RELEASE_TABLET | Freq: Every day | ORAL | Status: DC
  Administered 2017-10-27 – 2017-10-28 (×2): 81 mg via ORAL
  Filled 2017-10-27 (×2): qty 1

## 2017-10-27 MED ORDER — INSULIN ASPART 100 UNIT/ML ~~LOC~~ SOLN: 60.0000 [IU] | Freq: Three times a day (TID) | SUBCUTANEOUS | Status: DC

## 2017-10-27 MED ORDER — INSULIN ASPART PROT & ASPART (70-30 MIX) 100 UNIT/ML ~~LOC~~ SUSP
50.0000 [IU] | Freq: Two times a day (BID) | SUBCUTANEOUS | Status: DC
  Filled 2017-10-27: qty 10

## 2017-10-27 MED ORDER — LORAZEPAM 2 MG/ML IJ SOLN
2.0000 mg | Freq: Once | INTRAMUSCULAR | Status: AC
  Administered 2017-10-27: 2 mg via INTRAVENOUS
  Filled 2017-10-27: qty 1

## 2017-10-27 MED ORDER — PHENYTOIN SODIUM 50 MG/ML IJ SOLN
100.0000 mg | Freq: Three times a day (TID) | INTRAMUSCULAR | Status: DC
  Administered 2017-10-27 – 2017-11-01 (×15): 100 mg via INTRAVENOUS
  Filled 2017-10-27 (×16): qty 2

## 2017-10-27 MED ORDER — FLUTICASONE FUROATE-VILANTEROL 100-25 MCG/INH IN AEPB
1.0000 | INHALATION_SPRAY | Freq: Every day | RESPIRATORY_TRACT | Status: DC
  Filled 2017-10-27: qty 28

## 2017-10-27 MED ORDER — LEVOTHYROXINE SODIUM 50 MCG PO TABS
50.0000 ug | ORAL_TABLET | Freq: Every day | ORAL | Status: DC
  Administered 2017-10-28 – 2017-11-06 (×9): 50 ug via ORAL
  Filled 2017-10-27 (×10): qty 1

## 2017-10-27 MED ORDER — SODIUM CHLORIDE 0.9 % IV SOLN
1500.0000 mg | Freq: Once | INTRAVENOUS | Status: AC
  Administered 2017-10-27: 1500 mg via INTRAVENOUS
  Filled 2017-10-27: qty 30

## 2017-10-27 MED ORDER — ENOXAPARIN SODIUM 40 MG/0.4ML ~~LOC~~ SOLN
40.0000 mg | SUBCUTANEOUS | Status: DC
  Administered 2017-10-27: 40 mg via SUBCUTANEOUS
  Filled 2017-10-27: qty 0.4

## 2017-10-27 MED ORDER — SODIUM CHLORIDE 0.9 % IV SOLN
200.0000 mg | Freq: Two times a day (BID) | INTRAVENOUS | Status: DC
  Administered 2017-10-27 – 2017-11-15 (×38): 200 mg via INTRAVENOUS
  Filled 2017-10-27 (×40): qty 20

## 2017-10-27 NOTE — Progress Notes (Signed)
Pt had episode of right arm stiffness, Left eye deviation and head turn, unresponsive for 65 seconds. MD aware of continuous seizing. Will continue to monitor

## 2017-10-27 NOTE — H&P (Signed)
History and Physical    Daniel [name]:[id number] DOB: [birth year] DOA: 10/27/2017  PCP: Antonietta Jewel, MD Consultants:  Neurology [name] coming from: [facility]  Chief Complaint: Seizure  HPI: Daniel [name] is a 52 y.o. male with medical history significant for insulin-dependent diabetes type 2, hypertension, obstructive sleep apnea, respiratory failure with hypoxia, questionable asthma, and a history of seizures in childhood who presented to [facility] with altered mental status and confusion. He was brought to Daniel emergency room after suffering a headache for about 4 weeks.  During these 4 weeks [name] began to have hallucinations where he was talking to people who were not there.  According to his wife he is also been answering questions inappropriately and having difficulty with ambulation and falling.  On admission Daniel [name] had a markedly elevated blood pressure with a systolic over 188 for nearly 4 hours despite multiple medications.  [name]'s headache by report tended to coincide with his elevations in blood pressure.  [name]'s wife reported that [name] had not been taking his medications as directed including his blood pressure medicines and his insulin.  He also appears to be poorly compliant with follow-up with his PCP.  He was initiated on an esmolol drip which was subsequently discontinued as his blood pressure improved.  Tele-neurology was consulted who recommended MRI of Daniel brain, neurochecks.  Subsequently [name] developed brief episodes of unconsciousness, with horizontal nystagmus consistent with seizures.  EEG was also consistent with seizures.  He was transferred to Daniel ICU on Vimpat, Daniel following day Daniel [name] continued to have Daniel episodes. Vimpat was DC'd and [name] was started on IV Keppra with no improvement in his symptoms. According to nursing staff and wife despite IV Keppra he continued to have absence seizure like activity wherein he becomes unresponsive for  few minutes then becomes conscious; with no recollection of Daniel event. Dr. Leonel Ramsay with neurology here was called and recommended that Daniel [name] be transferred to [facility] for a 24-hour monitor EEG, and further work-up and assessment for status epilepticus.  [name] also had uncontrolled diabetes mellitus, has been started on insulin 70 /30, dosage has been titrated for better blood sugar control.  Medications of also been titrated for better blood pressure control.  Upon arrival to Daniel floor [name] had another seizure-like episode where and he stopped speaking to staff, turned his head to Daniel left and his eyes deviated to Daniel left with horizontal nystagmus.  There was no tonic-clonic activity.  Vital signs remained stable throughout Daniel episode and he regained his baseline consciousness approximately 30 seconds later.  Review of Systems: As per HPI; otherwise review of systems reviewed and negative.    Past Medical History:  Diagnosis Date  . Pneumonia   . Pulmonary embolism ([place])   . Sleep apnea     No past surgical history on file.  Social History   Socioeconomic History  . Marital status: Married    Spouse name: Not on file  . Number of children: Not on file  . Years of education: Not on file  . Highest education level: Not on file  Occupational History  . Not on file  Social Needs  . Financial resource strain: Not on file  . Food insecurity:    Worry: Not on file    Inability: Not on file  . Transportation needs:    Medical: Not on file    Non-medical: Not on file  Tobacco Use  . Smoking status: Never Smoker  . Smokeless  tobacco: Former Systems developer    Types: Chew  Substance and Sexual Activity  . Alcohol use: Never    Frequency: Never  . Drug use: Never  . Sexual activity: Not on file  Lifestyle  . Physical activity:    Days per week: Not on file    Minutes per session: Not on file  . Stress: Not on file  Relationships  . Social connections:    Talks on phone: Not  on file    Gets together: Not on file    Attends religious service: Not on file    Active member of club or organization: Not on file    Attends meetings of clubs or organizations: Not on file    Relationship status: Not on file  . Intimate partner violence:    Fear of current or ex partner: Not on file    Emotionally abused: Not on file    Physically abused: Not on file    Forced sexual activity: Not on file  Other Topics Concern  . Not on file  Social History Narrative  . Not on file    No Known Allergies  Family History  Problem Relation Age of Onset  . Alpha-1 antitrypsin deficiency Neg Hx   . Asthma Neg Hx   . Breast cancer Neg Hx   . Stroke Neg Hx   . Colon cancer Neg Hx   . Coronary artery disease Neg Hx   . COPD Neg Hx   . Cystic fibrosis Neg Hx   . Diabetes Neg Hx   . Deep vein thrombosis Neg Hx   . Hypertension Neg Hx   . Hyperlipidemia Neg Hx   . Kidney disease Neg Hx   . Liver disease Neg Hx   . Lung cancer Neg Hx   . Lupus Neg Hx   . Neurofibromatosis Neg Hx   . Osteoporosis Neg Hx   . Ovarian cancer Neg Hx   . Positive PPD/TB Exposure Neg Hx   . Prostate cancer Neg Hx   . Pulmonary embolism Neg Hx   . Pulmonary fibrosis Neg Hx   . Rheum arthritis Neg Hx   . Sarcoidosis Neg Hx   . Sleep apnea Neg Hx   . Thyroid disease Neg Hx   . Tuberculosis Neg Hx   . Tuberous sclerosis Neg Hx     Prior to Admission medications   Medication Sig Start Date End Date Taking? Authorizing Provider  albuterol (PROVENTIL HFA) 108 (90 Base) MCG/ACT inhaler Inhale into Daniel lungs.    [provider]  Armodafinil 250 MG tablet TAKE 1 TABLET BY MOUTH EVERY DAY 12/14/16   [provider]  aspirin EC 81 MG tablet Take by mouth.    [provider]  cycloSPORINE (RESTASIS) 0.05 % ophthalmic emulsion INSTILL 1 DROP IN BOTH EYES BID 11/14/16   [provider]  fluticasone furoate-vilanterol (BREO ELLIPTA) 100-25 MCG/INH AEPB Inhale into Daniel  lungs. 05/05/17   [provider]  hydrALAZINE (APRESOLINE) 100 MG tablet TAKE 1 TABLET BY MOUTH THREE TIMES A DAY 10/04/16   [provider]  hydrochlorothiazide (HYDRODIURIL) 25 MG tablet Take by mouth. 07/08/16   [provider]  insulin regular (HUMULIN R) 100 units/mL injection Inject into Daniel skin.    [provider]  levothyroxine (SYNTHROID, LEVOTHROID) 50 MCG tablet  12/22/16   [provider]  lisinopril (PRINIVIL,ZESTRIL) 10 MG tablet Take 10 mg by mouth daily. 07/01/17   [provider]  metFORMIN (GLUCOPHAGE) 1000  MG tablet TAKE 1 TABLET BY MOUTH TWICE (2) DAILY 07/20/17   [provider]  torsemide (DEMADEX) 20 MG tablet TAKE 1 TABLET BY MOUTH EVERY DAY 11/29/16   [provider]    Physical Exam: Vitals:   10/27/17 1425  BP: (!) 146/84     . General: Obese male who appears confused but in no apparent distress  . Eyes:  PERRL, EOMI, normal lids, iris . ENT:  grossly normal hearing, lips & tongue, mmm; appropriate dentition . Neck:  no LAD, masses or thyromegaly; no carotid bruits . Cardiovascular:  normal rate, reg rhythm, no murmur. Marland Kitchen Respiratory:   CTA bilaterally with no wheezes/rales/rhonchi.  Normal respiratory effort. . Abdomen: protuberant, soft, NT, ND, NABS . Skin:  no rash or induration seen on limited exam . Musculoskeletal:  grossly normal tone BUE/BLE, good ROM, no bony abnormality or obvious joint deformity . Lower extremity:  Trace LE edema.  Limited foot exam with no ulcerations.  2+ distal pulses. Marland Kitchen Psychiatric:  grossly normal mood and affect, speech mildly dysarthric  . Neurologic: confused but knows he left [facility]; cannot name his location now. Disoriented to date.  CN 2-12 grossly intact, moves all extremities in coordinated fashion except cannot or will not move his toes on command. Difficult neuro exam 2/2 [name] unable or unwilling to cooperate.    Radiological Exams on  Admission: No results found.   Labs on Admission: I have personally reviewed Daniel available labs and imaging studies at Daniel time of Daniel admission.  Pertinent labs:  At OSH, B12, folate, TSH were all within normal limits with Daniel exception of a mildly elevated TSH but a normal free T4.  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 [name] continues to have seizure-like episodes approximately every 10 minutes where he has a fixed lateral gaze with horizontal nystagmus.  He continues on IV Keppra.  24-hour EEG has been ordered.  Closely monitor for possible status epilepticus.  Seizure precautions.  We will keep n.p.o. except for sips with meds.  Hypertension: Status post hypertensive emergency at outside hospital likely secondary to medication noncompliance, now off esmolol drip with improved blood pressure after titrating medications.  Continue his home medications torsemide, lisinopril, hydralazine.  IV hydralazine as needed  Uncontrolled diabetes type 2, insulin-dependent, secondary to medication noncompliance.  Hemoglobin A1c unable to be measured greater than 14.  At discharge he was being treated with 70/30 at 50 units twice daily.  We will continue and titrate as needed.  Continue [name] on sliding scale insulin.  He will need assistance with obtaining his long-acting insulin.  Obstructive sleep apnea: Continue CPAP at night and as needed.  Medication noncompliance: Continue to advise [name] to be compliant with his medications.  Appreciate case management involvement.  DVT prophylaxis:  Lovenox  Code Status:  Full -  confirmed with [name]/family Disposition Plan: TBD  Consults called: Neurology  Admission status: Admit - It is my clinical opinion that admission to INPATIENT is reasonable and necessary because of Daniel expectation that this [name] will require hospital care that crosses at least 2 midnights to treat this condition based on Daniel  medical complexity of Daniel problems presented.  Given Daniel aforementioned information, Daniel predictability of an adverse outcome is felt to be significant.     Janora Norlander MD [facility]  If note is complete, please contact covering daytime or nighttime physician. [email or web address] Password [email or web address]  10/27/2017, 3:11 PM

## 2017-10-27 NOTE — Progress Notes (Signed)
Pt had episode of left eye deviation, right hand twitching and unresponsive for 45 seconds.

## 2017-10-27 NOTE — Progress Notes (Signed)
Pt on cont. EEG and unable to wear cpap tonight. RT will continue to monitor as needed.

## 2017-10-27 NOTE — Progress Notes (Signed)
Patient removed IV, continues to pull at lines and mittens, not verbally redirectable. MD placed order for sitter.

## 2017-10-27 NOTE — Consult Note (Signed)
Neurology Consultation Reason for Consult: Seizures Referring Physician: Steffanie Dunn, S  CC: Seizures  History is obtained from: Chart review  HPI: Daniel [name] is a 52 y.o. male with a history of PE, OSA who presented to [facility] with progressive encephaloapthy, hallucinations over the pst four weeks.  He began having difficulty with walking and was falling and stumbling over this timeframe.  On admission he had a severely elevated blood pressure and is thought his headaches may be due to this.  It was noted that he was developing brief episodes of unresponsiveness with horizontal nystagmus and gaze deviation and EEG was obtained which captured an epileptic seizure.  He was started on with Keppra and Vimpat, though not at the same time with continued episodes.  He was therefore transferred for continuous EEG monitoring.    ROS: A 14 point ROS was performed and is negative except as noted in the HPI.   Past Medical History:  Diagnosis Date  . Pneumonia   . Pulmonary embolism ([place])   . Sleep apnea      Family History  Problem Relation Age of Onset  . Alpha-1 antitrypsin deficiency Neg Hx   . Asthma Neg Hx   . Breast cancer Neg Hx   . Stroke Neg Hx   . Colon cancer Neg Hx   . Coronary artery disease Neg Hx   . COPD Neg Hx   . Cystic fibrosis Neg Hx   . Diabetes Neg Hx   . Deep vein thrombosis Neg Hx   . Hypertension Neg Hx   . Hyperlipidemia Neg Hx   . Kidney disease Neg Hx   . Liver disease Neg Hx   . Lung cancer Neg Hx   . Lupus Neg Hx   . Neurofibromatosis Neg Hx   . Osteoporosis Neg Hx   . Ovarian cancer Neg Hx   . Positive PPD/TB Exposure Neg Hx   . Prostate cancer Neg Hx   . Pulmonary embolism Neg Hx   . Pulmonary fibrosis Neg Hx   . Rheum arthritis Neg Hx   . Sarcoidosis Neg Hx   . Sleep apnea Neg Hx   . Thyroid disease Neg Hx   . Tuberculosis Neg Hx   . Tuberous sclerosis Neg Hx      Social History:  reports that he has never smoked. He quit  smokeless tobacco use about 4 years ago.  His smokeless tobacco use included chew. He reports that he does not drink alcohol or use drugs.   Exam: Current vital signs: BP (!) 139/124 (BP Location: Right Arm)   Pulse 79   Resp 17   SpO2 98%  Vital signs in last 24 hours: Pulse Rate:  [79] 79 (09/06 1640) Resp:  [17] 17 (09/06 1640) BP: (139-146)/(84-124) 139/124 (09/06 1640) SpO2:  [98 %] 98 % (09/06 1640)   Physical Exam  Constitutional: Appears well-developed and well-nourished.  Psych: Affect appropriate to situation Eyes: No scleral injection HENT: No OP obstrucion Head: Normocephalic.  Cardiovascular: Normal rate and regular rhythm.  Respiratory: Effort normal, non-labored breathing GI: Soft.  No distension. There is no tenderness.  Skin: WDI  Neuro: Mental Status: Patient is awake, alert, he is not oriented to place or month He has a  left hemineglect Cranial Nerves: II: Left hemianopia. Pupils are equal, round, and reactive to light.   III,IV, VI: He has a right gaze preference V: Facial sensation is symmetric to temperature VII: Facial movement is symmetric.  VIII: hearing is  intact to voice X: Uvula elevates symmetrically XI: Shoulder shrug is symmetric. XII: tongue is midline without atrophy or fasciculations.  Motor: He appears to have relatively symmetric strength but does have a mild motor neglect.   Sensory: He extinguishes to double simultaneous stimulation  Cerebellar: No clear ataxia  I have reviewed labs in epic and the results pertinent to this consultation are: Borderline creatinine at 1.59 CBC-unremarkable  I have reviewed the images obtained: I reviewed the MRI done at [facility], it is motion limited and therefore subtle findings such as cortical T2 change are difficult to assess.  Impression: 52 year old male with 4-week history of progressive encephalopathy/hallucinations now with refractory seizures.  This constellation of symptoms is  concerning for an autoimmune etiology in this age range, possibly voltage-gated potassium channel antibodies.  At the current time I would favor aggressive treatment with  antiepileptic medications to try and reduce the frequency of his seizures.  Given that he is significantly improving between the seizures and is likely been having them for some time I would not favor moving straight to continuous IV medications and intubation but would rather try more conventional antiepileptics first.  Recommendations: 1) TSH, RPR, HIV, ESR, CRP 2) LP for cells, glucose, protein, fungal cultures, CNS autoantibodies(not emergent) 3) continuous EEG monitoring 4) fosphenytoin load followed by 100 mg 3 times daily 5) Vimpat 200 mill grams twice daily 6) if seizures persist despite Vimpat/fosphenytoin, would consider loading with Keppra 1-2 g 7) neurology will continue to follow   Roland Rack, MD [facility] [phone]  If 7pm- 7am, please page neurology on call as listed in [place].

## 2017-10-27 NOTE — Progress Notes (Signed)
Pt arrived to unit. Two episodes of L gaze and unresponsiveness, 60 seconds and 30 seconds. Neurology and MD aware

## 2017-10-27 NOTE — Progress Notes (Signed)
vLTM EEG running. Educated Marine scientist and family member of event button. Neuro notified.

## 2017-10-27 NOTE — Progress Notes (Signed)
Addendum 10:42 PM:  IV Vimpat 1st dose started at 9:12 PM. Last electrographic seizure occurred at 10:09 PM with no further electrographic seizures since then. Will reassess EEG later tonight. Continue Vimpat and Dilantin.   Electronically signed: Dr. Kerney Elbe

## 2017-10-28 ENCOUNTER — Inpatient Hospital Stay (HOSPITAL_COMMUNITY): Payer: Medicaid Other

## 2017-10-28 DIAGNOSIS — I10 Essential (primary) hypertension: Secondary | ICD-10-CM

## 2017-10-28 DIAGNOSIS — G40901 Epilepsy, unspecified, not intractable, with status epilepticus: Secondary | ICD-10-CM

## 2017-10-28 DIAGNOSIS — E662 Morbid (severe) obesity with alveolar hypoventilation: Secondary | ICD-10-CM

## 2017-10-28 LAB — CSF CELL COUNT WITH DIFFERENTIAL
LYMPHS CSF: 60 % (ref 40–80)
Monocyte-Macrophage-Spinal Fluid: 30 % (ref 15–45)
RBC Count, CSF: 99 /mm3 — ABNORMAL HIGH
SEGMENTED NEUTROPHILS-CSF: 10 % — AB (ref 0–6)
Tube #: 1
WBC, CSF: 4 /mm3 (ref 0–5)

## 2017-10-28 LAB — COMPREHENSIVE METABOLIC PANEL
ALBUMIN: 2.7 g/dL — AB (ref 3.5–5.0)
ALT: 23 U/L (ref 0–44)
AST: 18 U/L (ref 15–41)
Alkaline Phosphatase: 63 U/L (ref 38–126)
Anion gap: 8 (ref 5–15)
BILIRUBIN TOTAL: 0.9 mg/dL (ref 0.3–1.2)
BUN: 24 mg/dL — AB (ref 6–20)
CHLORIDE: 107 mmol/L (ref 98–111)
CO2: 25 mmol/L (ref 22–32)
CREATININE: 1.44 mg/dL — AB (ref 0.61–1.24)
Calcium: 8.4 mg/dL — ABNORMAL LOW (ref 8.9–10.3)
GFR calc Af Amer: >60 mL/min (ref >60)
GFR, EST NON AFRICAN AMERICAN: 54 mL/min — AB (ref >60)
GLUCOSE: 166 mg/dL — AB (ref 70–99)
POTASSIUM: 4.2 mmol/L (ref 3.5–5.1)
Sodium: 140 mmol/L (ref 135–145)
Total Protein: 5.7 g/dL — ABNORMAL LOW (ref 6.5–8.1)

## 2017-10-28 LAB — POCT I-STAT 3, ART BLOOD GAS (G3+)
Acid-base deficit: 1 mmol/L (ref 0.0–2.0)
BICARBONATE: 25.8 mmol/L (ref 20.0–28.0)
O2 Saturation: 99 %
Patient temperature: 99.1
TCO2: 27 mmol/L (ref 22–32)
pCO2 arterial: 53.7 mmHg — ABNORMAL HIGH (ref 32.0–48.0)
pH, Arterial: 7.291 — ABNORMAL LOW (ref 7.350–7.450)
pO2, Arterial: 175 mmHg — ABNORMAL HIGH (ref 83.0–108.0)

## 2017-10-28 LAB — LACTIC ACID, PLASMA
LACTIC ACID, VENOUS: 0.8 mmol/L (ref 0.5–1.9)
Lactic Acid, Venous: 1.3 mmol/L (ref 0.5–1.9)

## 2017-10-28 LAB — GLUCOSE, CAPILLARY
GLUCOSE-CAPILLARY: 171 mg/dL — AB (ref 70–99)
GLUCOSE-CAPILLARY: 150 mg/dL — AB (ref 70–99)
Glucose-Capillary: 164 mg/dL — ABNORMAL HIGH (ref 70–99)
Glucose-Capillary: 93 mg/dL (ref 70–99)
Glucose-Capillary: 105 mg/dL — ABNORMAL HIGH (ref 70–99)

## 2017-10-28 LAB — HIV ANTIBODY (ROUTINE TESTING W REFLEX): HIV Screen 4th Generation wRfx: NONREACTIVE

## 2017-10-28 LAB — MRSA PCR SCREENING: MRSA by PCR: NEGATIVE

## 2017-10-28 LAB — PROTEIN AND GLUCOSE, CSF
GLUCOSE CSF: 104 mg/dL — AB (ref 40–70)
TOTAL PROTEIN, CSF: 81 mg/dL — AB (ref 15–45)

## 2017-10-28 LAB — TRIGLYCERIDES: TRIGLYCERIDES: 246 mg/dL — AB (ref <150)

## 2017-10-28 LAB — PHENYTOIN LEVEL, TOTAL: Phenytoin Lvl: 7.1 ug/mL — ABNORMAL LOW (ref 10.0–20.0)

## 2017-10-28 LAB — RPR: RPR: NONREACTIVE

## 2017-10-28 LAB — HEMOGLOBIN A1C
HEMOGLOBIN A1C: 15.8 % — AB (ref 4.8–5.6)
Mean Plasma Glucose: 406.76 mg/dL

## 2017-10-28 MED ORDER — MIDAZOLAM BOLUS VIA INFUSION
20.0000 mg | INTRAVENOUS | Status: AC
  Administered 2017-10-28 (×3): 20 mg via INTRAVENOUS
  Filled 2017-10-28 (×2): qty 20

## 2017-10-28 MED ORDER — CHLORHEXIDINE GLUCONATE 0.12% ORAL RINSE (MEDLINE KIT)
15.0000 mL | Freq: Two times a day (BID) | OROMUCOSAL | Status: DC
  Administered 2017-10-28 – 2017-11-03 (×12): 15 mL via OROMUCOSAL

## 2017-10-28 MED ORDER — MIDAZOLAM BOLUS VIA INFUSION
10.0000 mg | INTRAVENOUS | Status: AC | PRN
  Administered 2017-10-28 (×6): 10 mg via INTRAVENOUS
  Filled 2017-10-28: qty 10

## 2017-10-28 MED ORDER — HYDRALAZINE HCL 20 MG/ML IJ SOLN
5.0000 mg | Freq: Four times a day (QID) | INTRAMUSCULAR | Status: DC | PRN
  Administered 2017-10-28 – 2017-11-03 (×4): 5 mg via INTRAVENOUS
  Filled 2017-10-28 (×4): qty 1

## 2017-10-28 MED ORDER — FAMOTIDINE IN NACL 20-0.9 MG/50ML-% IV SOLN
20.0000 mg | Freq: Two times a day (BID) | INTRAVENOUS | Status: DC
  Administered 2017-10-28 – 2017-10-30 (×5): 20 mg via INTRAVENOUS
  Filled 2017-10-28 (×5): qty 50

## 2017-10-28 MED ORDER — MIDAZOLAM BOLUS VIA INFUSION
20.0000 mg | INTRAVENOUS | Status: DC
  Filled 2017-10-28 (×3): qty 20

## 2017-10-28 MED ORDER — PROPOFOL 1000 MG/100ML IV EMUL
5.0000 ug/kg/min | INTRAVENOUS | Status: DC
  Administered 2017-10-28 – 2017-10-29 (×4): 30 ug/kg/min via INTRAVENOUS
  Administered 2017-10-30: 20 ug/kg/min via INTRAVENOUS
  Filled 2017-10-28 (×7): qty 100

## 2017-10-28 MED ORDER — MIDAZOLAM BOLUS VIA INFUSION
10.0000 mg | INTRAVENOUS | Status: DC
  Administered 2017-10-28 (×5): 10 mg via INTRAVENOUS

## 2017-10-28 MED ORDER — SODIUM CHLORIDE 0.9 % IV SOLN
130.0000 mg/h | INTRAVENOUS | Status: DC
  Administered 2017-10-28 – 2017-10-29 (×4): 130 mg/h via INTRAVENOUS
  Administered 2017-10-29: 50 mg/h via INTRAVENOUS
  Administered 2017-10-29: 130 mg/h via INTRAVENOUS
  Filled 2017-10-28 (×8): qty 50

## 2017-10-28 MED ORDER — SODIUM CHLORIDE 0.9 % IV SOLN
100.0000 mg/h | INTRAVENOUS | Status: DC
  Administered 2017-10-28: 100 mg/h via INTRAVENOUS
  Filled 2017-10-28: qty 50

## 2017-10-28 MED ORDER — FENTANYL CITRATE (PF) 100 MCG/2ML IJ SOLN
100.0000 ug | Freq: Once | INTRAMUSCULAR | Status: AC
  Administered 2017-10-28: 100 ug via INTRAVENOUS

## 2017-10-28 MED ORDER — ORAL CARE MOUTH RINSE
15.0000 mL | OROMUCOSAL | Status: DC
  Administered 2017-10-28 – 2017-11-03 (×55): 15 mL via OROMUCOSAL

## 2017-10-28 MED ORDER — SODIUM CHLORIDE 0.9 % IV SOLN
0.2000 mg/kg/h | INTRAVENOUS | Status: DC
  Administered 2017-10-28: 0.2 mg/kg/h via INTRAVENOUS
  Filled 2017-10-28 (×2): qty 10

## 2017-10-28 MED ORDER — MIDAZOLAM HCL 2 MG/2ML IJ SOLN
INTRAMUSCULAR | Status: AC
  Administered 2017-10-28: 10 mg via INTRAVENOUS
  Filled 2017-10-28: qty 10

## 2017-10-28 MED ORDER — ENOXAPARIN SODIUM 40 MG/0.4ML ~~LOC~~ SOLN
40.0000 mg | SUBCUTANEOUS | Status: DC
  Administered 2017-10-29: 40 mg via SUBCUTANEOUS
  Filled 2017-10-28 (×2): qty 0.4

## 2017-10-28 MED ORDER — LORAZEPAM 2 MG/ML IJ SOLN
1.0000 mg | Freq: Once | INTRAMUSCULAR | Status: AC
  Administered 2017-10-28: 1 mg via INTRAVENOUS
  Filled 2017-10-28: qty 1

## 2017-10-28 MED ORDER — MIDAZOLAM BOLUS VIA INFUSION
20.0000 mg | INTRAVENOUS | Status: AC
  Administered 2017-10-28 (×3): 20 mg via INTRAVENOUS
  Filled 2017-10-28 (×3): qty 20

## 2017-10-28 MED ORDER — INSULIN ASPART 100 UNIT/ML ~~LOC~~ SOLN
0.0000 [IU] | SUBCUTANEOUS | Status: DC
  Administered 2017-10-28: 3 [IU] via SUBCUTANEOUS
  Administered 2017-10-29 (×2): 4 [IU] via SUBCUTANEOUS
  Administered 2017-10-30 (×2): 7 [IU] via SUBCUTANEOUS
  Administered 2017-10-30: 4 [IU] via SUBCUTANEOUS
  Administered 2017-10-30: 7 [IU] via SUBCUTANEOUS
  Administered 2017-10-30: 4 [IU] via SUBCUTANEOUS
  Administered 2017-10-31 (×2): 7 [IU] via SUBCUTANEOUS
  Administered 2017-10-31 (×2): 4 [IU] via SUBCUTANEOUS
  Administered 2017-10-31 (×2): 7 [IU] via SUBCUTANEOUS
  Administered 2017-11-01: 4 [IU] via SUBCUTANEOUS
  Administered 2017-11-01: 7 [IU] via SUBCUTANEOUS
  Administered 2017-11-01: 11 [IU] via SUBCUTANEOUS
  Administered 2017-11-01 (×3): 7 [IU] via SUBCUTANEOUS
  Administered 2017-11-02: 11 [IU] via SUBCUTANEOUS
  Administered 2017-11-02 (×3): 7 [IU] via SUBCUTANEOUS
  Administered 2017-11-02: 15 [IU] via SUBCUTANEOUS
  Administered 2017-11-02: 7 [IU] via SUBCUTANEOUS

## 2017-10-28 MED ORDER — SODIUM CHLORIDE 0.9 % IV SOLN
100.0000 mg/h | INTRAVENOUS | Status: DC
  Filled 2017-10-28: qty 50

## 2017-10-28 MED ORDER — SODIUM CHLORIDE 0.9 % IV SOLN
0.2000 mg/kg/h | INTRAVENOUS | Status: DC
  Administered 2017-10-28: 0.2 mg/kg/h via INTRAVENOUS
  Filled 2017-10-28: qty 50

## 2017-10-28 MED ORDER — MIDAZOLAM BOLUS VIA INFUSION
10.0000 mg | INTRAVENOUS | Status: DC | PRN
  Administered 2017-10-28 (×3): 10 mg via INTRAVENOUS
  Filled 2017-10-28: qty 10

## 2017-10-28 MED ORDER — ETOMIDATE 2 MG/ML IV SOLN
20.0000 mg | Freq: Once | INTRAVENOUS | Status: AC
  Administered 2017-10-28: 20 mg via INTRAVENOUS

## 2017-10-28 MED ORDER — FLUTICASONE FUROATE-VILANTEROL 100-25 MCG/INH IN AEPB
1.0000 | INHALATION_SPRAY | Freq: Every day | RESPIRATORY_TRACT | Status: DC
  Administered 2017-10-28: 1 via RESPIRATORY_TRACT
  Filled 2017-10-28: qty 28

## 2017-10-28 MED ORDER — FENTANYL CITRATE (PF) 100 MCG/2ML IJ SOLN
INTRAMUSCULAR | Status: AC
  Filled 2017-10-28: qty 4

## 2017-10-28 MED ORDER — PHENYLEPHRINE HCL-NACL 10-0.9 MG/250ML-% IV SOLN
0.0000 ug/min | INTRAVENOUS | Status: DC
  Administered 2017-10-28: 50 ug/min via INTRAVENOUS
  Administered 2017-10-28 (×2): 110 ug/min via INTRAVENOUS
  Administered 2017-10-28: 60 ug/min via INTRAVENOUS
  Administered 2017-10-28: 100 ug/min via INTRAVENOUS
  Administered 2017-10-28: 25 ug/min via INTRAVENOUS
  Administered 2017-10-29: 100 ug/min via INTRAVENOUS
  Administered 2017-10-29: 70 ug/min via INTRAVENOUS
  Administered 2017-10-29: 80 ug/min via INTRAVENOUS
  Filled 2017-10-28 (×9): qty 250

## 2017-10-28 MED ORDER — ROCURONIUM BROMIDE 50 MG/5ML IV SOLN
100.0000 mg | Freq: Once | INTRAVENOUS | Status: AC
  Administered 2017-10-28: 100 mg via INTRAVENOUS
  Filled 2017-10-28: qty 10

## 2017-10-28 MED ORDER — SODIUM CHLORIDE 0.9 % IV SOLN
2000.0000 mg | Freq: Once | INTRAVENOUS | Status: AC
  Administered 2017-10-28: 2000 mg via INTRAVENOUS
  Filled 2017-10-28: qty 20

## 2017-10-28 NOTE — Procedures (Signed)
LTM-EEG Report  HISTORY: Continuous video-EEG monitoring performed for 52 year old with encephalopathy, hallucinations, seizures.  ACQUISITION: International 10-20 system for electrode placement; 18 channels with additional eyes linked to ipsilateral ears and EKG. Additional T1-T2 electrodes were used. Continuous video recording obtained.   EEG NUMBER:  MEDICATIONS:  Day 1: see EMR  DAY #1: from 1547 10/27/17 to 0730 10/28/17  BACKGROUND: An overall medium voltage continuous recording with good spontaneous variability and reactivity. Waking background consisted of a medium voltage 8 Hz posterior dominant rhythm over the left with low voltage beta activity in the bilateral frontocentral regions (decreased on the right) and some medium voltage theta activity diffusely. Additional focal 2-4hz  activity was seen in the right posterior region throughout the record. There was increased generalized slow activity over the course of the recording. Sleep was captured with normal stage II sleep architecture.   EPILEPTIFORM/PERIODIC ACTIVITY: Ictal spikes as below, paucity of interictal activity SEIZURES: There were recurrent electrographic or subtle clinical seizures arising from the right occipital region throughout the study. These improved slightly in frequency and duration with additional AED loading in the afternoon and evening, however persisted overnight. These occurred every 20-40 minutes and lasted 60 seconds duration. There was electrographic onset of ictal fast activity in the right occipital region with building and spread bilaterally. Some left gaze deviation or subtle limb twitching was observed on occasion. EVENTS: Seizures as described above  EKG: no significant arrhythmia  SUMMARY: This was an abnormal continuous video EEG due to focal non-convulsive status epilepticus with seizures arising from the right occipital region. Seizures were not appreciably changed in frequency with additional  AED loading throughout the afternoon and evening.

## 2017-10-28 NOTE — Consult Note (Addendum)
Name: Daniel [name] MRN: [id number] DOB: 08/24/1965    ADMISSION DATE:  10/27/2017 CONSULTATION DATE: October 28, 2017  REFERRING MD : Neurology service  CHIEF COMPLAINT: Status epilepticus  BRIEF PATIENT DESCRIPTION: Status epilepticus due to severe hypoglycemia for prolonged course with MRI changes read by the neurologist  SIGNIFICANT EVENTS  Unable to contribute conventional therapy came to the ICU for intubation and deep coma for management of status epilepticus.  STUDIES:  MRI with hypoglycemic changes   HISTORY OF PRESENT ILLNESS: Patient is a 52 year old Caucasian male who is morbidly obese was on the floor for management of hypoglycemia seizures he presented to [facility] he had also history of PE obstructive sleep apnea obesity ventilation syndrome progressive encephalopathy hallucination past 4 weeks he had difficulty walking and will follow falling and stumbling patient had was diagnosed with status epilepticus on the floor and managed with conservative treatment so he was admitted to the ICU service for deep, and aggressive seizure treatment as he is not recovering with regular treatment. Of note patient has had blood sugar 5-700 range for long time  PAST MEDICAL HISTORY :   has a past medical history of Pneumonia, Pulmonary embolism ([place]), and Sleep apnea.  has no past surgical history on file. Prior to Admission medications   Medication Sig Start Date End Date Taking? Authorizing Provider  albuterol (PROVENTIL HFA) 108 (90 Base) MCG/ACT inhaler Inhale into the lungs.    [provider]  Armodafinil 250 MG tablet TAKE 1 TABLET BY MOUTH EVERY DAY 12/14/16   [provider]  aspirin EC 81 MG tablet Take by mouth.    [provider]  cycloSPORINE (RESTASIS) 0.05 % ophthalmic emulsion INSTILL 1 DROP IN BOTH EYES BID 11/14/16   [provider]  fluticasone furoate-vilanterol (BREO ELLIPTA) 100-25 MCG/INH AEPB Inhale into the lungs.  05/05/17   [provider]  hydrALAZINE (APRESOLINE) 100 MG tablet TAKE 1 TABLET BY MOUTH THREE TIMES A DAY 10/04/16   [provider]  hydrochlorothiazide (HYDRODIURIL) 25 MG tablet Take by mouth. 07/08/16   [provider]  insulin regular (HUMULIN R) 100 units/mL injection Inject into the skin.    [provider]  levothyroxine (SYNTHROID, LEVOTHROID) 50 MCG tablet  12/22/16   [provider]  lisinopril (PRINIVIL,ZESTRIL) 10 MG tablet Take 10 mg by mouth daily. 07/01/17   [provider]  metFORMIN (GLUCOPHAGE) 1000 MG tablet TAKE 1 TABLET BY MOUTH TWICE (2) DAILY 07/20/17   [provider]  torsemide (DEMADEX) 20 MG tablet TAKE 1 TABLET BY MOUTH EVERY DAY 11/29/16   [provider]   No Known Allergies  FAMILY HISTORY:  family history is not on file. SOCIAL HISTORY:  reports that he has never smoked. He quit smokeless tobacco use about 4 years ago.  His smokeless tobacco use included chew. He reports that he does not drink alcohol or use drugs.  REVIEW OF SYSTEMS:   Inability to assess due to patient's conditions.  SUBJECTIVE:   VITAL SIGNS: Temp:  [97.8 F (36.6 C)-99.1 F (37.3 C)] 99.1 F (37.3 C) (09/07 1100) Pulse Rate:  [65-92] 92 (09/07 1100) Resp:  [17-29] 29 (09/07 1100) BP: (108-170)/(55-124) 128/56 (09/07 1100) SpO2:  [97 %-100 %] 97 % (09/07 1100) Weight:  [140.6 kg] 140.6 kg (09/06 2040)  PHYSICAL EXAMINATION: General: Patient is a status epilepticus totally disoriented not working in between episodes Neuro: Patient is a status epilepticus not able to assess HEENT:  atraumatic , no jaundice ,  dry mucous membranes  Cardiovascular:  Irregular irregular , ESM 2/6 in the aortic area  Lungs:  CTA bilateral , no wheezing or crackles  Abdomen:  Soft lax +BS , no tenderness . Musculoskeletal:  WNL , normal pulses  Skin:  No rash    Recent Labs  Lab 10/27/17 1557  NA 141  K 4.5  CL 108  CO2 26    BUN 29*  CREATININE 1.59*  GLUCOSE 195*   Recent Labs  Lab 10/27/17 1557  HGB 13.6  HCT 42.7  WBC 7.7  PLT 291   No results found.  ASSESSMENT / PLAN:   Management of status epilepticus with severe hyperglycemia. --Severe status epilepticus we will consulted by the neurology department to admit this patient to the ICU for generalized coma for treatment of status epilepticus given his presentation and duration of it and ability to control it with conventional therapy. --Attempt breathing machine management of ventilator. --For sedation continue with Versed this is the preferred choice for the neurology department given his presentation. --Severe hypoglycemia with CNS changes prolonged check globin and sees aggressive sliding scale insulin consideration of IV insulin. --Lack of access will do a central line. --Patient will be in DVT prophylaxis with Lovenox he will be on H2 blocker for GI prophylaxis. --Aggressive management of his hyperglycemia given that the differential includes a central nervous system because of prolonged severe hyperglycemia. --Critical care time is in this patient is 45 minutes.  Pulmonary and [place] Pager: [phone]  10/28/2017, 12:31 PM

## 2017-10-28 NOTE — Progress Notes (Signed)
Patient for transfer to ICU per MD order; report called to ICU-RN; patient transported by staff to 4N22; EEG tech. Followed with equip.; sitter accompanied patient as well.

## 2017-10-28 NOTE — Progress Notes (Signed)
Pt was moved to 4n22 with EEG. Maint was done. No skin breakdown. Continue to monitor

## 2017-10-28 NOTE — Progress Notes (Signed)
RT note: RT advanced ETT 2cm per MD order.

## 2017-10-28 NOTE — Progress Notes (Signed)
Patient had 3  seizure episodes ; left eye gaze, for 30 seconds as reported by the sitter, MD aware of episodic seizing of the patient, EEG monitoring in progress

## 2017-10-28 NOTE — Progress Notes (Signed)
Subjective: Continues to have seizures.   I was able to get a hold of his wife and sister today and received a better history:  He began having abnormal symptoms in mid August which began with intermittent episodes of unresponsiveness as well as intermittent visual distortions and complaining of seeing swirling pattern of lights.  These became more frequent and his confusion became more prominent over the next few weeks.  He would occasionally stumble into things, seemed unsteady, though they are not sure if it is because he was running into things on his left side or not.  On Monday he had his first generalized convulsive seizure and he was taken to the emergency room at [facility] and over the past few days he has progressively become more and more frequent at which point he was transferred to [facility].  He does not have any history of these types of episodes in the past.   He also has been complaining of headache and for this reason the swelling pattern of lights in the left visual field was attributed to migraine aura though he has no history of this.  Exam: Vitals:   10/28/17 0319 10/28/17 0512  BP: (!) 170/90 (!) 158/91  Pulse: 84 88  Resp: (!) 23 (!) 21  Temp: 98.3 F (36.8 C)   SpO2: 100% 97%   Gen: In bed, NAD Resp: non-labored breathing, no acute distress Abd: soft, nt  Neuro: MS: Awake, alert, he is able to answer simple questions, but is not oriented. CN: Pupils equal round reactive, left hemianopia, face symmetric Motor: He has a motor neglect and is difficult to get him to give full effort on the left side but once he does he has good strength with 5 out of 5 strength bilaterally. Sensory: He endorses symmetric sensation, but has a significant sensory neglect.  Pertinent Labs: ESR 34 CRP 0.8  I have reviewed his MRI and he has significant T2 hypointensity in the right occipital lobe.  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 He has significant T2 hypointensity in the right occipital lobe which is strongly suggestive of hyperglycemic hemianopia. I still think tha tLP to rule out other etiologies of new onset refractory status epilepticus(NORSE) would be prudent.   Given the lack of response to 3 AEDs, I think that control without   Recommendations: 1) Intubation. Will burst suppress with midazolam 2) continue keppra, vimpat, dilantin 3) continue EEG monitoring.  4) glycemic control per CCM  This patient is critically ill and at significant risk of neurological worsening, death and care requires constant monitoring of vital signs, hemodynamics,respiratory and cardiac monitoring, neurological assessment, discussion with family, other specialists and medical decision making of high complexity. I spent 60 minutes of neurocritical care time  in the care of  this patient.  Roland Rack, MD [facility] [phone]  If 7pm- 7am, please page neurology on call as listed in [place]. 10/28/2017  10:22 AM

## 2017-10-28 NOTE — Procedures (Signed)
Name:  [name] MRN:  [id number] DOB:  August 02, 1965  PROCEDURE NOTE  Procedure:  Central venous catheter placement.  Indications:  Need for intravenous access and hemodynamic monitoring.  Consent:  Consent was implied due to the emergency nature of the procedure.  Anesthesia:  A total of 10 mL of 1% Lidocaine was used for local infiltration anesthesia.  Procedure summary:  Appropriate equipment was assembled.  The patient was identified as Daniel [name] and safety timeout was performed. The patient was placed in Trendelenburg position.  Sterile technique was used. The patient's left anterior chest wall was prepped using chlorhexidine / alcohol scrub and the field was draped in usual sterile fashion with full body drape. After the adequate anesthesia was achieved, the left IJ vein was cannulated with the introducer needle wihout difficulty. A guide wire was advanced through the introducer needle, which was then withdrawn. A small skin incision was made at the point of wire entry, the dilator was inserted over the guide wire and appropriate dilation was obtained. The dilator was removed and 7 French triple-lumen catheter was advanced over the guide wire, which was then removed.  All ports were aspirated and flushed with normal saline without difficulty. The catheter was secured into place at 18 cm. Antibiotic patch was placed and sterile dressing was applied. Post-procedure chest x-ray was ordered.  Complications:  No immediate complications were noted.  Hemodynamic parameters and oxygenation remained stable throughout the procedure.  Estimated blood loss:  Less then 5 mL.  Etheleen Nicks, MD Pulmonary and [place]   10/28/2017, 12:28 PM

## 2017-10-28 NOTE — Procedures (Signed)
Name: Daniel [name] MRN: [id number] DOB: 03-Feb-1966   PROCEDURE NOTE  Procedure:  Endotracheal intubation.  Indication:  Acute respiratory failure  Consent:  Consent was implied due to the emergency nature of the procedure.  Anesthesia:  A total of 10 mg of Etomidate was given intravenously.  Procedure summary:  Appropriate equipment was assembled. The patient was identified as Daniel [name] and safety timeout was performed. The patient was placed supine, with head in sniffing position. After adequate level of anesthesia was achieved, a 4 Mac Glide blade was inserted into the oropharynx and the vocal cords were visualized. A 8.00 mm endotracheal tube was inserted without difficulty and visualized going through the vocal cords. The stylette was removed and cuff inflated. Colorimetric change was noted on the CO2 meter. Breath sounds were heard over both lung fields equally. ETT was secured at 26 lip line.  Post procedure chest xray was ordered.  Complications:  No immediate complications were noted.  Hemodynamic parameters and oxygenation remained stable throughout the procedure.      Rollene Fare MD  Pulmonary and [place] Pager: [phone]  10/28/2017, 12:02 PM

## 2017-10-28 NOTE — Progress Notes (Signed)
Patient continue to have episodes of seizures; glare with right hand and leg twitching, sitter order renewed for another 12 hrs.

## 2017-10-29 ENCOUNTER — Inpatient Hospital Stay (HOSPITAL_COMMUNITY): Payer: Medicaid Other

## 2017-10-29 DIAGNOSIS — J9601 Acute respiratory failure with hypoxia: Secondary | ICD-10-CM

## 2017-10-29 LAB — COMPREHENSIVE METABOLIC PANEL
ALBUMIN: 2.3 g/dL — AB (ref 3.5–5.0)
ALK PHOS: 57 U/L (ref 38–126)
ALT: 21 U/L (ref 0–44)
ANION GAP: 7 (ref 5–15)
AST: 18 U/L (ref 15–41)
BILIRUBIN TOTAL: 0.6 mg/dL (ref 0.3–1.2)
BUN: 28 mg/dL — ABNORMAL HIGH (ref 6–20)
CALCIUM: 7.4 mg/dL — AB (ref 8.9–10.3)
CO2: 21 mmol/L — ABNORMAL LOW (ref 22–32)
CREATININE: 1.92 mg/dL — AB (ref 0.61–1.24)
Chloride: 113 mmol/L — ABNORMAL HIGH (ref 98–111)
GFR calc non Af Amer: 38 mL/min — ABNORMAL LOW (ref >60)
GFR, EST AFRICAN AMERICAN: 44 mL/min — AB (ref >60)
GLUCOSE: 103 mg/dL — AB (ref 70–99)
Potassium: 4.7 mmol/L (ref 3.5–5.1)
SODIUM: 141 mmol/L (ref 135–145)
Total Protein: 5 g/dL — ABNORMAL LOW (ref 6.5–8.1)

## 2017-10-29 LAB — GLUCOSE, CAPILLARY
GLUCOSE-CAPILLARY: 155 mg/dL — AB (ref 70–99)
GLUCOSE-CAPILLARY: 187 mg/dL — AB (ref 70–99)
GLUCOSE-CAPILLARY: 86 mg/dL (ref 70–99)
GLUCOSE-CAPILLARY: 95 mg/dL (ref 70–99)
Glucose-Capillary: 102 mg/dL — ABNORMAL HIGH (ref 70–99)
Glucose-Capillary: 104 mg/dL — ABNORMAL HIGH (ref 70–99)

## 2017-10-29 LAB — PHOSPHORUS
Phosphorus: 4.4 mg/dL (ref 2.5–4.6)
Phosphorus: 4.6 mg/dL (ref 2.5–4.6)

## 2017-10-29 LAB — MAGNESIUM
MAGNESIUM: 1.7 mg/dL (ref 1.7–2.4)
Magnesium: 1.8 mg/dL (ref 1.7–2.4)
Magnesium: 1.6 mg/dL — ABNORMAL LOW (ref 1.7–2.4)

## 2017-10-29 LAB — CBC WITH DIFFERENTIAL/PLATELET
Abs Immature Granulocytes: 0.1 10*3/uL (ref 0.0–0.1)
Basophils Absolute: 0 10*3/uL (ref 0.0–0.1)
Basophils Relative: 0 %
Eosinophils Absolute: 0.4 10*3/uL (ref 0.0–0.7)
Eosinophils Relative: 4 %
HCT: 42.1 % (ref 39.0–52.0)
HEMOGLOBIN: 12.4 g/dL — AB (ref 13.0–17.0)
IMMATURE GRANULOCYTES: 1 %
LYMPHS ABS: 2 10*3/uL (ref 0.7–4.0)
LYMPHS PCT: 17 %
MCH: 27.6 pg (ref 26.0–34.0)
MCHC: 29.5 g/dL — ABNORMAL LOW (ref 30.0–36.0)
MCV: 93.6 fL (ref 78.0–100.0)
Monocytes Absolute: 1.2 10*3/uL — ABNORMAL HIGH (ref 0.1–1.0)
Monocytes Relative: 10 %
NEUTROS PCT: 68 %
Neutro Abs: 7.8 10*3/uL — ABNORMAL HIGH (ref 1.7–7.7)
Platelets: 359 10*3/uL (ref 150–400)
RBC: 4.5 MIL/uL (ref 4.22–5.81)
RDW: 14.9 % (ref 11.5–15.5)
WBC: 11.5 10*3/uL — AB (ref 4.0–10.5)

## 2017-10-29 LAB — POCT I-STAT 3, ART BLOOD GAS (G3+)
Acid-base deficit: 6 mmol/L — ABNORMAL HIGH (ref 0.0–2.0)
Bicarbonate: 22.3 mmol/L (ref 20.0–28.0)
O2 SAT: 99 %
PCO2 ART: 56.3 mmHg — AB (ref 32.0–48.0)
PH ART: 7.208 — AB (ref 7.350–7.450)
Patient temperature: 99.6
TCO2: 24 mmol/L (ref 22–32)
pO2, Arterial: 163 mmHg — ABNORMAL HIGH (ref 83.0–108.0)

## 2017-10-29 LAB — APTT: APTT: 39 s — AB (ref 24–36)

## 2017-10-29 LAB — PHENYTOIN LEVEL, TOTAL: Phenytoin Lvl: 5.5 ug/mL — ABNORMAL LOW (ref 10.0–20.0)

## 2017-10-29 LAB — PROTIME-INR
INR: 1.06
Prothrombin Time: 13.7 seconds (ref 11.4–15.2)

## 2017-10-29 MED ORDER — SODIUM CHLORIDE 0.45 % IV SOLN
INTRAVENOUS | Status: DC
  Administered 2017-10-29 – 2017-11-03 (×3): via INTRAVENOUS

## 2017-10-29 MED ORDER — HEPARIN SODIUM (PORCINE) 5000 UNIT/ML IJ SOLN
5000.0000 [IU] | Freq: Three times a day (TID) | INTRAMUSCULAR | Status: DC
  Administered 2017-10-30 – 2017-11-22 (×70): 5000 [IU] via SUBCUTANEOUS
  Filled 2017-10-29 (×72): qty 1

## 2017-10-29 MED ORDER — PRO-STAT SUGAR FREE PO LIQD
30.0000 mL | Freq: Two times a day (BID) | ORAL | Status: DC
  Administered 2017-10-29 – 2017-10-30 (×3): 30 mL
  Filled 2017-10-29 (×2): qty 30

## 2017-10-29 MED ORDER — ASPIRIN 81 MG PO CHEW
81.0000 mg | CHEWABLE_TABLET | Freq: Every day | ORAL | Status: DC
  Administered 2017-10-29 – 2017-11-03 (×6): 81 mg
  Filled 2017-10-29 (×6): qty 1

## 2017-10-29 MED ORDER — VITAL HIGH PROTEIN PO LIQD
1000.0000 mL | ORAL | Status: DC
  Administered 2017-10-29: 1000 mL

## 2017-10-29 MED ORDER — SODIUM CHLORIDE 0.9 % IV SOLN
INTRAVENOUS | Status: DC | PRN
  Administered 2017-10-29: 500 mL via INTRAVENOUS

## 2017-10-29 NOTE — Progress Notes (Signed)
Per Neurologist midazolam has been titrated down throughout the day. From 130 mg/hr in the morning to 50mg /hr for an hour to 25 mg/hr to 10mg /hr 5mg /hr and finally has been turned off this evening around 1800.  Will continue to monitor patient.

## 2017-10-29 NOTE — Progress Notes (Addendum)
Daniel [name]  MCN:[id number] DOB: 09/02/1965 DOA: 10/27/2017 PCP: Antonietta Jewel, MD    LOS: 2 days   Reason for Consult / Chief Complaint:  Ventilator management during status epilepticus  Consulting MD and date:  Leonel Ramsay   HPI/Summary of hospital stay:  52 year old obese male admitted on 9/6 with refractory partial seizures generated from the right occipital lobe felt to be secondary to hyperglycemic hemianopia.  He was transferred to the intensive care on 9/7 as he had lack of response to 3 different anticonvulsants.  9/7: Critical care asked to assist with care. intubated, placed on continuous EEG monitoring, burst suppression therapy with versed, Keppra, Vimpat, and Dilantin continued.   Subjective:  Appears comfortable on vent  Objective   Blood pressure 108/68, pulse 81, temperature 99.4 F (37.4 C), temperature source Axillary, resp. rate (Abnormal) 24, height 5\' 9"  (1.753 m), weight (Abnormal) 139.1 kg, SpO2 97 %.    Vent Mode: PRVC FiO2 (%):  [40 %-50 %] 40 % Set Rate:  [15 bmp] 15 bmp Vt Set:  [570 mL] 570 mL PEEP:  [5 cmH20] 5 cmH20 Plateau Pressure:  [18 cmH20-21 cmH20] 21 cmH20   Intake/Output Summary (Last 24 hours) at 10/29/2017 1146 Last data filed at 10/29/2017 6294 Gross per 24 hour  Intake 5969.16 ml  Output 1215 ml  Net 4754.16 ml   Filed Weights   10/27/17 2040 10/28/17 1100  Weight: (Abnormal) 140.6 kg (Abnormal) 139.1 kg    Examination: General: 52 year old male currently sedated and on full ventilator support remains comatose currently HENT: Orally intubated neck large mucous membranes moist Lungs: Clear to auscultation diminished bases Cardiovascular: Regular rate and rhythm Abdomen: Obese, soft, positive bowel sounds Extremities: Equal strength and bulk no edema warm strong pulses Neuro: Comatose on Versed infusion GU: Clear yellow urine  Consults: date of consult/date signed off & final recs:   Critical care consulted on  9/7  Procedures: Endotracheal tube placed 9/7 Central venous catheter placed 9/7   Significant Diagnostic Tests: EEG 9/7: Focal nonconvulsive status epilepticus with seizures arising from the right occipital region.  No significant change after AED loading EEG 9/8: Burst suppression pattern.  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  Low lung volumes.  By lateral atelectasis right looks worse than left.  Cardiomegaly. Plan Full ventilator support Adjust minute ventilation and repeat arterial blood gas Continuing sedation as above, changed to PAD protocol once per suppression regimen completed VAP bundle Wean oxygen and PEEP as indicated  Diabetes with poorly controlled glucose.  Hemoglobin A1c 15.8 calculates to mean plasma glucose of 406 Glucose is now controlled Plan Continue sliding scale insulin protocol Holding metformin  History of systolic cardiomyopathy but most recent LV EF [facility] was normal History of hypertension History of coronary artery disease  Plan Holding hydralazine, lisinopril  And Demadex for now Daily aspirin Continue telemetry  monitoring  Fluid and electrolyte imbalance: Hyperchloremia Plan Change IV fluids to half-normal saline at 50 cc an hour  Acute on chronic renal failure review of his medical records suggest baseline creatinine 1.35-1.42 currently 0.92 Plan Avoid hypotension Renal dose medications Strict intake output Follow-up a.m. chemistry  History of hypothyroidism Plan Continue Synthroid  Mild leukocytosis Plan Trends CBC  Disposition / Summary of Today's Plan 10/29/17   Currently successfully suppressed.  No current seizures.  Propofol stopped.  Weaning Versed per neurology.  At this point we will adjust minute ventilation, worry if we were to go back up on sedation he would be inadequately ventilated.  Neurology is titrating Versed.  Once Versed is off we will transition to PAD protocol.  Starting tube feeds.  Continuing supportive care.  Best Practice / Goals of Care / Disposition.   DVT prophylaxis: LMWH-->SCH  GI prophylaxis: H2B Diet: start tubefeeds 9/8 Mobility:BR Code Status: full code  Family Communication: pending   Labs   CBC: Recent Labs  Lab 10/27/17 1557 10/29/17 0409  WBC 7.7 11.5*  NEUTROABS 5.2 7.8*  HGB 13.6 12.4*  HCT 42.7 42.1  MCV 88.4 93.6  PLT 291 841   Basic Metabolic Panel: Recent Labs  Lab 10/27/17 1557 10/28/17 1113 10/29/17 0409  NA 141 140 141  K 4.5 4.2 4.7  CL 108 107 113*  CO2 26 25 21*  GLUCOSE 195* 166* 103*  BUN 29* 24* 28*  CREATININE 1.59* 1.44* 1.92*  CALCIUM 8.5* 8.4* 7.4*  MG 2.2  --  1.8  PHOS 4.9*  --   --    GFR: Estimated Creatinine Clearance: 61.7 mL/min (A) (by C-G formula based on SCr of 1.92 mg/dL (H)). Recent Labs  Lab 10/27/17 1557 10/28/17 1311 10/28/17 1656 10/29/17 0409  WBC 7.7  --   --  11.5*  LATICACIDVEN  --  0.8 1.3  --    Liver Function Tests: Recent Labs  Lab 10/27/17 1557 10/28/17 1113 10/29/17 0409  AST 23 18 18   ALT 25 23 21   ALKPHOS 59 63 57  BILITOT 0.7 0.9 0.6  PROT 5.8* 5.7* 5.0*   ALBUMIN 2.7* 2.7* 2.3*   No results for input(s): LIPASE, AMYLASE in the last 168 hours. No results for input(s): AMMONIA in the last 168 hours. ABG    Component Value Date/Time   PHART 7.208 (L) 10/29/2017 0413   PCO2ART 56.3 (H) 10/29/2017 0413   PO2ART 163.0 (H) 10/29/2017 0413   HCO3 22.3 10/29/2017 0413   TCO2 24 10/29/2017 0413   ACIDBASEDEF 6.0 (H) 10/29/2017 0413   O2SAT 99.0 10/29/2017 0413    Coagulation Profile: Recent Labs  Lab 10/29/17 0409  INR 1.06   Cardiac Enzymes: No results for input(s): CKTOTAL, CKMB, CKMBINDEX, TROPONINI in the last 168 hours. HbA1C: Hgb A1c MFr Bld  Date/Time Value Ref Range Status  10/28/2017 11:13 AM 15.8 (H) 4.8 - 5.6 % Final    Comment:    (NOTE) Pre diabetes:          5.7%-6.4% Diabetes:              >6.4% Glycemic control for   <7.0% adults with diabetes    CBG: Recent Labs  Lab 10/28/17 1934 10/28/17 2323 10/29/17 0316 10/29/17 0752 10/29/17 [place] ACNP-BC [place] Pager # [phone] OR # [phone] if no answer  Attending Note:  52 year old male presenting to PCCM with acute respiratory failure for burst suppression post seizure that failed to respond to 3 different anti-convulsants.  On exam, he is completely sedate with clear lungs.  I reviewed CXR myself, ETT is in a good position.  Discussed with PCCM-NP.  Will maintain on full vent support for now.  Adjust vent for ABG.  Titrate anti-convulsantant therapy for EEG.  VAP prevention measures.  ISS and CBGs.  Hold anti-HTN.  PCCM will continue to follow.  The patient is critically ill with multiple organ systems failure and requires high complexity decision making for assessment and support, frequent evaluation and titration of therapies, application of advanced monitoring technologies and extensive interpretation of multiple databases.   Critical Care Time devoted to patient care services described in  this note is  34  Minutes. This time reflects time of care of this signee Dr Jennet Maduro. This critical care time does not reflect procedure time, or teaching time or supervisory time of PA/NP/Med student/Med Resident etc but could involve care discussion time.  Rush Farmer, M.D. [facility] Pulmonary/Critical Care Medicine. Pager: [phone]. After hours pager: [phone].

## 2017-10-29 NOTE — Progress Notes (Signed)
Subjective: No seizure since intubation.   Exam: Vitals:   10/29/17 1430 10/29/17 1500  BP: 113/71 124/75  Pulse: 88 86  Resp: (!) 22 (!) 22  Temp:    SpO2: 98% 99%   Gen: In bed, NAD Resp: non-labored breathing, no acute distress Abd: soft, nt  Neuro: Intubated and burst suppress Pupils reactive bilaterally  Pertinent Labs: ESR 34 CRP 0.8  I have reviewed his MRI and he has significant T2 hypointensity in the right occipital lobe.  Impression: 52 year old with refractory partial seizures arising from the right occipital lobe. He has significant T2 hypointensity in the right occipital lobe which is strongly suggestive of hyperglycemic hemianopia. I still think that LP to rule out other etiologies of new onset refractory status epilepticus(NORSE) would be prudent.   Given the lack of response to 3 AEDs, I decided that control without burst suppression was unlikely and therefore he was burst suppressed with midozalam and adjunctive propofol.  We are now weaning as he has been suppressed for more than 24 hours.  Recommendations: 1) wean sedation 2) continue keppra, vimpat, dilantin 3) continue EEG monitoring.  4) glycemic control per CCM  This patient is critically ill and at significant risk of neurological worsening, death and care requires constant monitoring of vital signs, hemodynamics,respiratory and cardiac monitoring, neurological assessment, discussion with family, other specialists and medical decision making of high complexity. I spent 35 minutes of neurocritical care time  in the care of  this patient.  Roland Rack, MD [facility] [phone]  If 7pm- 7am, please page neurology on call as listed in [place]. 10/29/2017  3:33 PM

## 2017-10-29 NOTE — Progress Notes (Signed)
EEG maint complete. No skin breakdown. Continue to monitor 

## 2017-10-29 NOTE — Procedures (Signed)
LTM-EEG Report  HISTORY: Continuous video-EEG monitoring performed for46year old with encephalopathy, hallucinations, seizures.  ACQUISITION: International 10-20 system for electrode placement; 18 channels with additional eyes linked to ipsilateral ears and EKG. Additional T1-T2 electrodes were used. Continuous video recording obtained.   EEG NUMBER:  MEDICATIONS: Day 2: see EMR  DAY #2: [id number] 9/7/19to 0730 10/29/17  BACKGROUND: An overall medium voltage discontinuous recording with poor spontaneous variability and reactivity. The waking background dissipated after 1145 with increased sedation. A burst-suppression pattern emerged and was sustained after about 1700 with periods of low voltage suppression lasting 2-5 seconds and bursts of medium irregular 2-5Hz  activity lasting 1-2 seconds.  EPILEPTIFORM/PERIODIC ACTIVITY:Seizures as below SEIZURES:Right occipital electrographic seizures were seen initially, similar to the previous day, with resolution after 1100 as sedation was increased.  EVENTS:Seizures as described above  EKG: no significant arrhythmia  SUMMARY: This wasan abnormal continuous video EEG due to burst-suppression pattern, iatrogenic in etiology. Focal non-convulsive seizures from the right occipital region resolved in the morning after sedation was increased.

## 2017-10-29 NOTE — Procedures (Signed)
Indication: Seizures  Risks of the procedure were dicussed with the patient including post-LP headache, bleeding, infection, weakness/numbness of legs(radiculopathy), death.  The patient/patient's proxy agreed and written consent was obtained.   The patient was prepped and draped, and using sterile technique a 20 gauge quinke spinal needle was inserted in the L4-5 space. The opening pressure was 22cm H20. Approximately 8 cc of CSF were obtained and sent for analysis.   Roland Rack, MD [facility] [phone]  If 7pm- 7am, please page neurology on call as listed in [place].

## 2017-10-30 ENCOUNTER — Inpatient Hospital Stay (HOSPITAL_COMMUNITY): Payer: Medicaid Other

## 2017-10-30 DIAGNOSIS — J988 Other specified respiratory disorders: Secondary | ICD-10-CM

## 2017-10-30 LAB — GLUCOSE, CAPILLARY
GLUCOSE-CAPILLARY: 175 mg/dL — AB (ref 70–99)
GLUCOSE-CAPILLARY: 202 mg/dL — AB (ref 70–99)
Glucose-Capillary: 203 mg/dL — ABNORMAL HIGH (ref 70–99)
Glucose-Capillary: 241 mg/dL — ABNORMAL HIGH (ref 70–99)
Glucose-Capillary: 201 mg/dL — ABNORMAL HIGH (ref 70–99)
Glucose-Capillary: 198 mg/dL — ABNORMAL HIGH (ref 70–99)

## 2017-10-30 LAB — IGG CSF INDEX
Albumin CSF-mCnc: 44 mg/dL (ref 11–48)
Albumin: 2.7 g/dL — ABNORMAL LOW (ref 3.5–5.5)
CSF IgG Index: 0.7 (ref 0.0–0.7)
IgG (Immunoglobin G), Serum: 580 mg/dL — ABNORMAL LOW (ref 700–1600)
IgG, CSF: 6.8 mg/dL (ref 0.0–8.6)
IgG/Alb Ratio, CSF: 0.15 (ref 0.00–0.25)

## 2017-10-30 LAB — CBC
HEMATOCRIT: 39.3 % (ref 39.0–52.0)
Hemoglobin: 11.9 g/dL — ABNORMAL LOW (ref 13.0–17.0)
MCH: 27.8 pg (ref 26.0–34.0)
MCHC: 30.3 g/dL (ref 30.0–36.0)
MCV: 91.8 fL (ref 78.0–100.0)
PLATELETS: 245 10*3/uL (ref 150–400)
RBC: 4.28 MIL/uL (ref 4.22–5.81)
RDW: 14.6 % (ref 11.5–15.5)
WBC: 8.3 10*3/uL (ref 4.0–10.5)

## 2017-10-30 LAB — COMPREHENSIVE METABOLIC PANEL
ALK PHOS: 54 U/L (ref 38–126)
ALT: 20 U/L (ref 0–44)
AST: 15 U/L (ref 15–41)
Albumin: 2.2 g/dL — ABNORMAL LOW (ref 3.5–5.0)
Anion gap: 7 (ref 5–15)
BILIRUBIN TOTAL: 0.8 mg/dL (ref 0.3–1.2)
BUN: 35 mg/dL — AB (ref 6–20)
CO2: 21 mmol/L — ABNORMAL LOW (ref 22–32)
Calcium: 7.8 mg/dL — ABNORMAL LOW (ref 8.9–10.3)
Chloride: 114 mmol/L — ABNORMAL HIGH (ref 98–111)
Creatinine, Ser: 1.83 mg/dL — ABNORMAL HIGH (ref 0.61–1.24)
GFR calc non Af Amer: 40 mL/min — ABNORMAL LOW (ref >60)
GFR, EST AFRICAN AMERICAN: 47 mL/min — AB (ref >60)
Glucose, Bld: 225 mg/dL — ABNORMAL HIGH (ref 70–99)
Potassium: 4.3 mmol/L (ref 3.5–5.1)
Sodium: 142 mmol/L (ref 135–145)
Total Protein: 5 g/dL — ABNORMAL LOW (ref 6.5–8.1)

## 2017-10-30 LAB — POCT I-STAT 3, ART BLOOD GAS (G3+)
ACID-BASE DEFICIT: 6 mmol/L — AB (ref 0.0–2.0)
BICARBONATE: 19.3 mmol/L — AB (ref 20.0–28.0)
O2 SAT: 99 %
PCO2 ART: 37.3 mmHg (ref 32.0–48.0)
PH ART: 7.328 — AB (ref 7.350–7.450)
PO2 ART: 127 mmHg — AB (ref 83.0–108.0)
Patient temperature: 100.2
TCO2: 20 mmol/L — AB (ref 22–32)

## 2017-10-30 LAB — MAGNESIUM
Magnesium: 1.9 mg/dL (ref 1.7–2.4)
Magnesium: 1.9 mg/dL (ref 1.7–2.4)

## 2017-10-30 LAB — PHOSPHORUS
Phosphorus: 3.2 mg/dL (ref 2.5–4.6)
Phosphorus: 3.1 mg/dL (ref 2.5–4.6)

## 2017-10-30 MED ORDER — ACETAMINOPHEN 160 MG/5ML PO SOLN
650.0000 mg | Freq: Four times a day (QID) | ORAL | Status: DC | PRN
  Administered 2017-10-30 – 2017-11-03 (×12): 650 mg via ORAL
  Filled 2017-10-30 (×13): qty 20.3

## 2017-10-30 MED ORDER — IPRATROPIUM-ALBUTEROL 0.5-2.5 (3) MG/3ML IN SOLN
3.0000 mL | Freq: Four times a day (QID) | RESPIRATORY_TRACT | Status: DC
  Administered 2017-10-30 – 2017-11-05 (×24): 3 mL via RESPIRATORY_TRACT
  Filled 2017-10-30 (×25): qty 3

## 2017-10-30 MED ORDER — FENTANYL CITRATE (PF) 100 MCG/2ML IJ SOLN
100.0000 ug | INTRAMUSCULAR | Status: DC | PRN
  Administered 2017-11-01 (×2): 100 ug via INTRAVENOUS
  Filled 2017-10-30 (×2): qty 2

## 2017-10-30 MED ORDER — TORSEMIDE 20 MG PO TABS
20.0000 mg | ORAL_TABLET | Freq: Every day | ORAL | Status: DC
  Administered 2017-10-31 – 2017-11-01 (×2): 20 mg
  Filled 2017-10-30 (×3): qty 1

## 2017-10-30 MED ORDER — VITAL HIGH PROTEIN PO LIQD
1000.0000 mL | ORAL | Status: DC
  Administered 2017-10-31: 1000 mL

## 2017-10-30 MED ORDER — DOCUSATE SODIUM 50 MG/5ML PO LIQD
100.0000 mg | Freq: Two times a day (BID) | ORAL | Status: DC | PRN
  Administered 2017-10-31: 100 mg
  Filled 2017-10-30: qty 10

## 2017-10-30 MED ORDER — PRO-STAT SUGAR FREE PO LIQD
60.0000 mL | Freq: Every day | ORAL | Status: DC
  Administered 2017-10-30 – 2017-10-31 (×4): 60 mL
  Filled 2017-10-30 (×5): qty 60

## 2017-10-30 MED ORDER — STERILE WATER FOR INJECTION IJ SOLN
INTRAMUSCULAR | Status: AC
  Filled 2017-10-30: qty 10

## 2017-10-30 MED ORDER — PROPOFOL 1000 MG/100ML IV EMUL
0.0000 ug/kg/min | INTRAVENOUS | Status: DC
  Administered 2017-10-30 – 2017-10-31 (×8): 50 ug/kg/min via INTRAVENOUS
  Administered 2017-10-31: 40 ug/kg/min via INTRAVENOUS
  Administered 2017-10-31 (×5): 50 ug/kg/min via INTRAVENOUS
  Administered 2017-11-01: 30 ug/kg/min via INTRAVENOUS
  Administered 2017-11-01 (×2): 40 ug/kg/min via INTRAVENOUS
  Administered 2017-11-01: 50 ug/kg/min via INTRAVENOUS
  Administered 2017-11-01: 20 ug/kg/min via INTRAVENOUS
  Administered 2017-11-01: 30 ug/kg/min via INTRAVENOUS
  Administered 2017-11-01: 50 ug/kg/min via INTRAVENOUS
  Administered 2017-11-02: 30 ug/kg/min via INTRAVENOUS
  Administered 2017-11-02 (×2): 40 ug/kg/min via INTRAVENOUS
  Administered 2017-11-02: 15 ug/kg/min via INTRAVENOUS
  Administered 2017-11-02 – 2017-11-03 (×2): 20 ug/kg/min via INTRAVENOUS
  Administered 2017-11-03: 15 ug/kg/min via INTRAVENOUS
  Filled 2017-10-30 (×11): qty 100
  Filled 2017-10-30: qty 200
  Filled 2017-10-30 (×15): qty 100

## 2017-10-30 MED ORDER — BUDESONIDE 0.25 MG/2ML IN SUSP
0.2500 mg | Freq: Two times a day (BID) | RESPIRATORY_TRACT | Status: DC
  Administered 2017-10-30 – 2017-11-22 (×45): 0.25 mg via RESPIRATORY_TRACT
  Filled 2017-10-30 (×47): qty 2

## 2017-10-30 MED ORDER — LEVETIRACETAM IN NACL 1000 MG/100ML IV SOLN
1000.0000 mg | Freq: Two times a day (BID) | INTRAVENOUS | Status: DC
  Administered 2017-10-30 – 2017-11-15 (×32): 1000 mg via INTRAVENOUS
  Filled 2017-10-30 (×34): qty 100

## 2017-10-30 MED ORDER — BISACODYL 10 MG RE SUPP: 10.0000 mg | Freq: Every day | RECTAL | Status: DC | PRN

## 2017-10-30 MED ORDER — PANTOPRAZOLE SODIUM 40 MG PO PACK
40.0000 mg | PACK | ORAL | Status: DC
  Administered 2017-10-30 – 2017-11-12 (×10): 40 mg
  Filled 2017-10-30 (×10): qty 20

## 2017-10-30 MED ORDER — MONTELUKAST SODIUM 10 MG PO TABS
10.0000 mg | ORAL_TABLET | Freq: Every day | ORAL | Status: DC
  Administered 2017-10-30 – 2017-11-02 (×4): 10 mg
  Filled 2017-10-30 (×4): qty 1

## 2017-10-30 MED ORDER — MIDAZOLAM HCL 2 MG/2ML IJ SOLN
2.0000 mg | INTRAMUSCULAR | Status: DC | PRN
  Administered 2017-11-02: 2 mg via INTRAVENOUS
  Filled 2017-10-30: qty 2

## 2017-10-30 NOTE — Progress Notes (Signed)
MEDICATION RELATED CONSULT NOTE - INITIAL   Pharmacy Consult for Phenytoin Indication: Seizures  Allergies  Allergen Reactions  . Montelukast Other (See Comments)    Possibly cause headaches per spouse    Patient Measurements: Height: 5\' 9"  (175.3 cm) Weight: (!) 306 lb 7 oz (139 kg) IBW/kg (Calculated) : 70.7   Vital Signs: Temp: 100.8 F (38.2 C) (09/09 0800) Temp Source: Axillary (09/09 0800) BP: 129/72 (09/09 0757) Pulse Rate: 90 (09/09 0757) Intake/Output from previous day: 09/08 0701 - 09/09 0700 In: 2847 [I.V.:2010.9; NG/GT:646; IV Piggyback:190.1] Out: 1610 [Urine:3425] Intake/Output from this shift: No intake/output data recorded.  Labs: Recent Labs    10/27/17 1557 10/28/17 1113 10/29/17 0409 10/29/17 1300 10/29/17 1648 10/30/17 0530  WBC 7.7  --  11.5*  --   --  8.3  HGB 13.6  --  12.4*  --   --  11.9*  HCT 42.7  --  42.1  --   --  39.3  PLT 291  --  359  --   --  245  APTT  --   --  39*  --   --   --   CREATININE 1.59* 1.44* 1.92*  --   --  1.83*  MG 2.2  --  1.8 1.6* 1.7 1.9  PHOS 4.9*  --   --  4.4 4.6 3.2  ALBUMIN 2.7* 2.7* 2.3*  --   --  2.2*  PROT 5.8* 5.7* 5.0*  --   --  5.0*  AST 23 18 18   --   --  15  ALT 25 23 21   --   --  20  ALKPHOS 59 63 57  --   --  54  BILITOT 0.7 0.9 0.6  --   --  0.8   Estimated Creatinine Clearance: 64.7 mL/min (A) (by C-G formula based on SCr of 1.83 mg/dL (H)).   Microbiology: Recent Results (from the past 720 hour(s))  MRSA PCR Screening     Status: None   Collection Time: 10/28/17 10:55 AM  Result Value Ref Range Status   MRSA by PCR NEGATIVE NEGATIVE Final    Comment:        The GeneXpert MRSA Assay (FDA approved for NASAL specimens only), is one component of a comprehensive MRSA colonization surveillance program. It is not intended to diagnose MRSA infection nor to guide or monitor treatment for MRSA infections. Performed at [facility], [place] [address] [place], [place] [address]    CSF culture     Status: None (Preliminary result)   Collection Time: 10/28/17  1:46 PM  Result Value Ref Range Status   Specimen Description CSF  Final   Special Requests NONE  Final   Gram Stain   Final    WBC PRESENT, PREDOMINANTLY MONONUCLEAR NO ORGANISMS SEEN CYTOSPIN SMEAR    Culture   Final    NO GROWTH < 24 HOURS Performed at [facility], [place] [address], [place], [place] [address]    Report Status PENDING  Incomplete    Medical History: Past Medical History:  Diagnosis Date  . Pneumonia   . Pulmonary embolism ([place])   . Sleep apnea     Assessment: 52 yo male with refractory partial seizures likely secondary to prolonged hyperglycemia.  Currently, seizures are controlled on Keppra, Vimpat and Dilantin.  Current dilantin dose is 100 mg IV q8hr resulting in an adjusted dilantin level of 10.2 mcg/ml as of 9/8.  Goal of Therapy:  Dilantin  level 10 - 20 mcg/ml  Plan:  Will plan to recheck Dilantin level on Wednesday, 9/11 and adjust dose as needed  Alanda Slim, PharmD, [facility] Clinical Pharmacist Please see AMION for all Pharmacists' Contact Phone Numbers 10/30/2017, 9:51 AM

## 2017-10-30 NOTE — Progress Notes (Signed)
Inpatient Diabetes Program Recommendations  AACE/ADA: New Consensus Statement on Inpatient Glycemic Control (2015)  Target Ranges:  Prepandial:   less than 140 mg/dL      Peak postprandial:   less than 180 mg/dL (1-2 hours)      Critically ill patients:  140 - 180 mg/dL   Lab Results  Component Value Date   GLUCAP 241 (H) 10/30/2017   HGBA1C 15.8 (H) 10/28/2017    Review of Glycemic Control Results for Daniel [name], Daniel [name] (MRN [id number]) as of 10/30/2017 14:02  Ref. Range 10/30/2017 02:52 10/30/2017 08:00 10/30/2017 12:14  Glucose-Capillary Latest Ref Range: 70 - 99 mg/dL 175 (H) 203 (H) 241 (H)   Diabetes history: Type 2 DM Outpatient Diabetes medications: Levemir 120 units QD, Metformin 500 mg BID Current orders for Inpatient glycemic control: Novolog 0-20 units Q4H Vital 10 ml/hr, Prostat 60 ml/hr  Inpatient Diabetes Program Recommendations:    Since initiating tube feeds blood sugars have trended up. Consider starting Novolog 5 units Q4H of tube feed coverage (anticipate adjustments once tube feeds are discontinued).   Will plan to see once appropriate.  Thanks, Bronson Curb, MSN, RNC-OB Diabetes Coordinator [phone] (8a-5p)

## 2017-10-30 NOTE — Progress Notes (Signed)
EEG Maint complete. No skin breakdown. Continue to monitor

## 2017-10-30 NOTE — Procedures (Signed)
LTM-EEG Report  HISTORY: Continuous video-EEG monitoring performed for67year old withencephalopathy, hallucinations, seizures. ACQUISITION: International 10-20 system for electrode placement; 18 channels with additional eyes linked to ipsilateral ears and EKG. Additional T1-T2 electrodes were used. Continuous video recording obtained.   EEG NUMBER:  MEDICATIONS: Day 3: see EMR  DAY #3: from07309/8/19to 0730 10/30/17  BACKGROUND: An overall medium voltage continuous recording with poor spontaneous variability and reactivity. The background consisted of medium voltage, semirhythmic theta-alpha bilaterally with superimposed 1-3Hz  slowing in short runs. No clear lateralizing features were observed. Some mild state changes were observed with increased delta slowing with arousal (atypical arousal). No posterior basic rhythm or sleep architecture was observed. Periods of attenuation became less apparent overnight as sedation was tapered off.  EPILEPTIFORM/PERIODIC ACTIVITY:none SEIZURES:none EVENTS:none reported  EKG: no significant arrhythmia  SUMMARY: This wasan abnormal continuous video EEG due to resolvedburst-suppression pattern with generalized slowing currently. No epileptiform or seizures were observed with tapeirng of midazolam after 1800.

## 2017-10-30 NOTE — Progress Notes (Signed)
Initial Nutrition Assessment  DOCUMENTATION CODES:   Morbid obesity  INTERVENTION:    Vital High Protein at 10 ml/h (240 ml per day)  Pro-stat 60 ml 5 times per day  Provides 1240 kcal (2341 kcal total with current Propofol rate), 171 gm protein, 201 ml free water daily  NUTRITION DIAGNOSIS:   Inadequate oral intake related to inability to eat as evidenced by NPO status.  GOAL:   Provide needs based on ASPEN/SCCM guidelines  MONITOR:   Vent status, TF tolerance, Labs, I & O's, Skin  REASON FOR ASSESSMENT:   Ventilator, Consult Enteral/tube feeding initiation and management  ASSESSMENT:   52 yo male with PMH of IDDM, HTN, OSA, OHS, and seizures who was admitted to [facility] on 9/6 with AMS. Had multiple seizures and was transferred to [facility]; required intubation 9/7.  Patient is currently intubated on ventilator support MV: 14.1 L/min Temp (24hrs), Avg:99.8 F (37.7 C), Min:99 F (37.2 C), Max:100.8 F (38.2 C)  Propofol: 41.7 ml/hr providing 1101 kcal per day  Received MD Consult for TF initiation and management. OGT in place. Currently receiving Vital High Protein via OGT at 40 ml/h (960 ml/day) with Prostat 30 ml BID per Adult TF Protocol.  Labs reviewed. CBG's: 175-203 Medications reviewed and include Propofol, Colace, Versed.    NUTRITION - FOCUSED PHYSICAL EXAM:    Most Recent Value  Orbital Region  No depletion  Upper Arm Region  No depletion  Thoracic and Lumbar Region  Unable to assess  Buccal Region  Unable to assess  Temple Region  No depletion  Clavicle Bone Region  No depletion  Clavicle and Acromion Bone Region  No depletion  Scapular Bone Region  Unable to assess  Dorsal Hand  No depletion  Patellar Region  No depletion  Anterior Thigh Region  No depletion  Posterior Calf Region  No depletion  Edema (RD Assessment)  None  Hair  Reviewed  Eyes  Unable to assess  Mouth  Unable to assess  Skin  Reviewed  Nails  Reviewed        Diet Order:   Diet Order            Diet NPO time specified  Diet effective now              EDUCATION NEEDS:   No education needs have been identified at this time  Skin:  Skin Assessment: Reviewed RN Assessment  Last BM:  PTA  Height:   Ht Readings from Last 1 Encounters:  10/28/17 5\' 9"  (1.753 m)    Weight:   Wt Readings from Last 1 Encounters:  10/30/17 (!) 139 kg    Ideal Body Weight:  72.7 kg  BMI:  Body mass index is 45.25 kg/m.  Estimated Nutritional Needs:   Kcal:  1525-1950  Protein:  182 gm  Fluid:  >/= 2 L    Molli Barrows, RD, LDN, [place] Pager [phone] After Hours Pager [phone]

## 2017-10-30 NOTE — Progress Notes (Addendum)
Subjective: Patient continues to be intubated, he is breathing over the vent, does not respond to noxious or verbal stimuli.  He is not on any sedating medications at this time.  No further seizures.  No longer on Versed  Exam: Vitals:   10/30/17 0700 10/30/17 0757  BP: 129/72 129/72  Pulse: 96 90  Resp: (!) 24 (!) 24  Temp:    SpO2: 95% 96%    Physical Exam   HEENT-  Normocephalic, no lesions, without obvious abnormality.  Normal external eye and conjunctiva.   Extremities- Warm, dry and intact Musculoskeletal-no joint tenderness, deformity or swelling Skin-warm and dry, no hyperpigmentation, vitiligo, or suspicious lesions Neuro:  Mental Status: Alert, oriented, thought content appropriate.  Speech fluent without evidence of aphasia.  Able to follow 3 step commands without difficulty. Cranial Nerves: II: No blink to threat III,IV, VI: Doll's eyes intact bilaterally pupils equal, round, reactive to light and accommodation V,VII: Intubated with no reaction to noxious stimuli face Motor: Flaccid throughout Sensory: No response to noxious stimuli Deep Tendon Reflexes: Depressed throughout plantars: Right: downgoing   Left: downgoing Cerebellar: normal finger-to-nose, normal rapid alternating movements and normal heel-to-shin test Gait: normal gait and station  Medications:  Scheduled: . aspirin  81 mg Per Tube Daily  . chlorhexidine gluconate (MEDLINE KIT)  15 mL Mouth Rinse BID  . feeding supplement (PRO-STAT SUGAR FREE 64)  30 mL Per Tube BID  . feeding supplement (VITAL HIGH PROTEIN)  1,000 mL Per Tube Q24H  . fluticasone furoate-vilanterol  1 puff Inhalation Daily  . heparin injection (subcutaneous)  5,000 Units Subcutaneous Q8H  . hydrALAZINE  25 mg Oral Q6H  . insulin aspart  0-20 Units Subcutaneous Q4H  . levothyroxine  50 mcg Oral QAC breakfast  . lisinopril  10 mg Oral Daily  . mouth rinse  15 mL Mouth Rinse 10 times per day  . montelukast  10 mg Oral QHS  .  phenytoin (DILANTIN) IV  100 mg Intravenous Q8H  . torsemide  20 mg Oral Daily   Continuous: . sodium chloride 50 mL/hr at 10/30/17 0700  . famotidine (PEPCID) IV Stopped (10/30/17 0020)  . lacosamide (VIMPAT) IV  200 mg twice daily  . midazolam (VERSED) infusion  stopped  . phenylephrine (NEO-SYNEPHRINE) Adult infusion 70 mcg/min (10/29/17 0620)  . propofol (DIPRIVAN) infusion 30 mcg/kg/min (10/29/17 0620)   GSU:PJSRPRXYV, hydrALAZINE  Pertinent Labs/Diagnostics: SUMMARY EEG day 3 LTM This wasan abnormal continuous video EEG due to resolvedburst-suppression pattern with generalized slowing currently. No epileptiform or seizures were observed with tapeirng of midazolam after 1800.   Current glucose 225 Creatinine 1.83 BUN 35 Calcium 7.8 Adjusted to Dilantin 10.2  Results for Daniel [name], Daniel [name] (MRN [id number]) as of 10/30/2017 09:02  Ref. Range 10/28/2017 14:05 10/28/2017 14:06  Appearance, CSF Latest Ref Range: CLEAR  CLEAR (A)   Glucose, CSF Latest Ref Range: 40 - 70 mg/dL  104 (H)  RBC Count, CSF Latest Ref Range: 0 /cu mm 99 (H)   WBC, CSF Latest Ref Range: 0 - 5 /cu mm 4   Segmented Neutrophils-CSF Latest Ref Range: 0 - 6 % 10 (H)   Lymphs, CSF Latest Ref Range: 40 - 80 % 60   Monocyte-Macrophage-Spinal Fluid Latest Ref Range: 15 - 45 % 30   Color, CSF Latest Ref Range: COLORLESS  COLORLESS   Supernatant Unknown COLORLESS   Total  Protein, CSF Latest Ref Range: 15 - 45 mg/dL  81 (H)  Tube # Unknown 1  Etta Quill PA-C [facility] [phone]   Assessment: 52 year old with 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     These were likely partial seizures also causing hemianopsia. Lack of control with 3 agents led to burst suppression with versed and propofol Versed d/c'd yesterday and EEG since showed no further seizures. Dilantin at this point is  10.2 and at the lower limits of normal.   Prolonged EEG shows no further seizures.   Impression:  -Hyperglycemia -Seizure  Recommendations: -continue Keppra 1g BID, Vimpat 200 BID, Dilantin 100 q8h (will request pharmacy consult for dilantin) -Can consider low dose benzos if breakthrough seizures occur. Call neurology for any clinical seizures. -We will continue LTM for now. Will consider discontinuing tomorrow if remains negative for seizure and exam improves.  Neurology will continue to follow  [place] This patient is critically ill and at significant risk of neurological worsening, death and care requires constant monitoring of vital signs, hemodynamics,respiratory and cardiac monitoring. I spent 30  minutes of neurocritical care time performing neurological assessment, discussion with family, other specialists and medical decision making of high complexityin the care of  this patient.

## 2017-10-30 NOTE — Progress Notes (Signed)
Daniel [name]  MPN:[id number] DOB: 25-Sep-1965 DOA: 10/27/2017 PCP: Antonietta Jewel, MD    LOS: 3 days   Reason for Consult / Chief Complaint:  Ventilator management during status epilepticus  Consulting MD and date:  Dr. Leonel Ramsay, neurology 10/28/17  HPI/Summary of hospital stay:  52 yo male presented to [facility] with altered mental status, confusion, headaches, hallucinations, and falls.  SBP was over 200.  In ER at [facility] he was started on esmolol gtt.  Developed seizure in [place] at [place].  He continued to have seizures and transferred to [facility].  He was started on burst suppression with versed and required intubation 9/07.  PMHx of IDDM, HTN, OSA, OHS, and seizures.  Subjective:  Agitated and increased WOB when sedation off.  Objective   Blood pressure (!) 152/74, pulse 94, temperature (!) 100.8 F (38.2 C), temperature source Axillary, resp. rate (!) 26, height 5\' 9"  (1.753 m), weight (!) 139 kg, SpO2 96 %.    Vent Mode: PRVC FiO2 (%):  [30 %-40 %] 30 % Set Rate:  [15 bmp-24 bmp] 24 bmp Vt Set:  [570 mL] 570 mL PEEP:  [5 cmH20] 5 cmH20 Plateau Pressure:  [18 cmH20-23 cmH20] 18 cmH20   Intake/Output Summary (Last 24 hours) at 10/30/2017 1012 Last data filed at 10/30/2017 0700 Gross per 24 hour  Intake 2089.72 ml  Output 3425 ml  Net -1335.28 ml   Filed Weights   10/27/17 2040 10/28/17 1100 10/30/17 0500  Weight: (!) 140.6 kg (!) 139.1 kg (!) 139 kg    Examination:  General - agitated Eyes - pupils reactive ENT - ETT in place Cardiac - regular rate/rhythm, tachycardic, no murmur Chest - using accessory muscles, b/l rhonchi Abdomen - soft, non tender, + bowel sounds GU - no lesions noted Extremities - 1+ edema Skin - no rashes Neuro - not following commands  CXR 9/09 (reviewed by me) >> low lung volumes  Consults: date of consult/date signed off & final recs:    Procedures: ETT 9/07 >> Lt IJ CVL 9/07 >>   Significant Diagnostic Tests: EEG 9/07 >>  focal non convulsive status epilepticus from Rt occipital region LP 9/08 >> RBC 99, WBC 4, glucose 104, protein 81  Micro Data: CSF 9/07 >>  CSF HSV 9/07 >>   Antimicrobials:   Resolved Hospital Problem list    Assessment & Plan:   Status epilepticus felt to be from hyperglycemic hemianopia. - AEDs per neurology - continue EEG monitoring for now - f/u CSF IgG, HSV from 9/07  Acute hypoxic respiratory failure with compromised airway. OSA, OHS, obstructive lung disease. - full vent support - f/u CXR - scheduled BDs, singulair - will need to keep on sedation while on vent to maintain vent synchrony >> restart diprivan with prn versed/fentanyl  DM type II. - SSI  CAD,HLD. HTN emergency with chronic diastolic CHF. - continue ASA, hydralazine, lisinopril, demadex  CKD 2. - baseline creatinine 1.35 - monitor renal fx, urine outpt  Hypothyroidism. - continue synthroid   Disposition / Summary of Today's Plan 10/30/17   Need to restart diprivan to maintain vent synchrony.  No further seizures on EEG monitoring.  Not ready for vent weaning.  Best Practice / Goals of Care / Disposition.   DVT prophylaxis: SQ heparin GI prophylaxis: protonix Diet: tube feeds Mobility: bed rest Code Status: full code  Family Communication: no family at bedside  Labs   CBC: Recent Labs  Lab 10/27/17 1557 10/29/17 0409 10/30/17  0530  WBC 7.7 11.5* 8.3  NEUTROABS 5.2 7.8*  --   HGB 13.6 12.4* 11.9*  HCT 42.7 42.1 39.3  MCV 88.4 93.6 91.8  PLT 291 359 007   Basic Metabolic Panel: Recent Labs  Lab 10/27/17 1557 10/28/17 1113 10/29/17 0409 10/29/17 1300 10/29/17 1648 10/30/17 0530  NA 141 140 141  --   --  142  K 4.5 4.2 4.7  --   --  4.3  CL 108 107 113*  --   --  114*  CO2 26 25 21*  --   --  21*  GLUCOSE 195* 166* 103*  --   --  225*  BUN 29* 24* 28*  --   --  35*  CREATININE 1.59* 1.44* 1.92*  --   --  1.83*  CALCIUM 8.5* 8.4* 7.4*  --   --  7.8*  MG 2.2  --  1.8 1.6*  1.7 1.9  PHOS 4.9*  --   --  4.4 4.6 3.2   GFR: Estimated Creatinine Clearance: 64.7 mL/min (A) (by C-G formula based on SCr of 1.83 mg/dL (H)). Recent Labs  Lab 10/27/17 1557 10/28/17 1311 10/28/17 1656 10/29/17 0409 10/30/17 0530  WBC 7.7  --   --  11.5* 8.3  LATICACIDVEN  --  0.8 1.3  --   --    Liver Function Tests: Recent Labs  Lab 10/27/17 1557 10/28/17 1113 10/29/17 0409 10/30/17 0530  AST 23 18 18 15   ALT 25 23 21 20   ALKPHOS 59 63 57 54  BILITOT 0.7 0.9 0.6 0.8  PROT 5.8* 5.7* 5.0* 5.0*  ALBUMIN 2.7* 2.7* 2.3* 2.2*   No results for input(s): LIPASE, AMYLASE in the last 168 hours. No results for input(s): AMMONIA in the last 168 hours. ABG    Component Value Date/Time   PHART 7.328 (L) 10/30/2017 0502   PCO2ART 37.3 10/30/2017 0502   PO2ART 127.0 (H) 10/30/2017 0502   HCO3 19.3 (L) 10/30/2017 0502   TCO2 20 (L) 10/30/2017 0502   ACIDBASEDEF 6.0 (H) 10/30/2017 0502   O2SAT 99.0 10/30/2017 0502    Coagulation Profile: Recent Labs  Lab 10/29/17 0409  INR 1.06   Cardiac Enzymes: No results for input(s): CKTOTAL, CKMB, CKMBINDEX, TROPONINI in the last 168 hours. HbA1C: Hgb A1c MFr Bld  Date/Time Value Ref Range Status  10/28/2017 11:13 AM 15.8 (H) 4.8 - 5.6 % Final    Comment:    (NOTE) Pre diabetes:          5.7%-6.4% Diabetes:              >6.4% Glycemic control for   <7.0% adults with diabetes    CBG: Recent Labs  Lab 10/29/17 1553 10/29/17 2021 10/29/17 2336 10/30/17 0252 10/30/17 0800  GLUCAP 104* 155* 187* 175* 203*   CC time 38 minutes  Chesley Mires, MD [place] 10/30/2017, 10:33 AM

## 2017-10-31 ENCOUNTER — Inpatient Hospital Stay (HOSPITAL_COMMUNITY): Payer: Medicaid Other

## 2017-10-31 DIAGNOSIS — J189 Pneumonia, unspecified organism: Secondary | ICD-10-CM

## 2017-10-31 LAB — GLUCOSE, CAPILLARY
GLUCOSE-CAPILLARY: 193 mg/dL — AB (ref 70–99)
GLUCOSE-CAPILLARY: 224 mg/dL — AB (ref 70–99)
Glucose-Capillary: 231 mg/dL — ABNORMAL HIGH (ref 70–99)
Glucose-Capillary: 213 mg/dL — ABNORMAL HIGH (ref 70–99)
Glucose-Capillary: 244 mg/dL — ABNORMAL HIGH (ref 70–99)
Glucose-Capillary: 177 mg/dL — ABNORMAL HIGH (ref 70–99)

## 2017-10-31 LAB — CSF CULTURE W GRAM STAIN: Culture: NO GROWTH

## 2017-10-31 LAB — COMPREHENSIVE METABOLIC PANEL
ALK PHOS: 61 U/L (ref 38–126)
ALT: 18 U/L (ref 0–44)
ANION GAP: 9 (ref 5–15)
AST: 17 U/L (ref 15–41)
Albumin: 2.1 g/dL — ABNORMAL LOW (ref 3.5–5.0)
BILIRUBIN TOTAL: 0.5 mg/dL (ref 0.3–1.2)
BUN: 46 mg/dL — ABNORMAL HIGH (ref 6–20)
CALCIUM: 7.9 mg/dL — AB (ref 8.9–10.3)
CO2: 21 mmol/L — ABNORMAL LOW (ref 22–32)
Chloride: 110 mmol/L (ref 98–111)
Creatinine, Ser: 2 mg/dL — ABNORMAL HIGH (ref 0.61–1.24)
GFR calc non Af Amer: 36 mL/min — ABNORMAL LOW (ref >60)
GFR, EST AFRICAN AMERICAN: 42 mL/min — AB (ref >60)
Glucose, Bld: 245 mg/dL — ABNORMAL HIGH (ref 70–99)
Potassium: 3.8 mmol/L (ref 3.5–5.1)
Sodium: 140 mmol/L (ref 135–145)
Total Protein: 5.4 g/dL — ABNORMAL LOW (ref 6.5–8.1)

## 2017-10-31 LAB — CBC
HCT: 39.4 % (ref 39.0–52.0)
HEMOGLOBIN: 11.9 g/dL — AB (ref 13.0–17.0)
MCH: 27.7 pg (ref 26.0–34.0)
MCHC: 30.2 g/dL (ref 30.0–36.0)
MCV: 91.8 fL (ref 78.0–100.0)
Platelets: 269 10*3/uL (ref 150–400)
RBC: 4.29 MIL/uL (ref 4.22–5.81)
RDW: 14.7 % (ref 11.5–15.5)
WBC: 9.3 10*3/uL (ref 4.0–10.5)

## 2017-10-31 LAB — PROCALCITONIN: Procalcitonin: 0.34 ng/mL

## 2017-10-31 LAB — MAGNESIUM
Magnesium: 2 mg/dL (ref 1.7–2.4)
Magnesium: 2 mg/dL (ref 1.7–2.4)

## 2017-10-31 LAB — PHOSPHORUS
PHOSPHORUS: 4.2 mg/dL (ref 2.5–4.6)
Phosphorus: 4 mg/dL (ref 2.5–4.6)

## 2017-10-31 LAB — TRIGLYCERIDES: Triglycerides: 310 mg/dL — ABNORMAL HIGH (ref <150)

## 2017-10-31 MED ORDER — PRO-STAT SUGAR FREE PO LIQD
30.0000 mL | Freq: Two times a day (BID) | ORAL | Status: DC
  Administered 2017-10-31: 30 mL

## 2017-10-31 MED ORDER — VANCOMYCIN HCL 10 G IV SOLR
1750.0000 mg | INTRAVENOUS | Status: DC
  Administered 2017-11-01: 1750 mg via INTRAVENOUS
  Filled 2017-10-31 (×2): qty 1750

## 2017-10-31 MED ORDER — VANCOMYCIN HCL 10 G IV SOLR
2000.0000 mg | Freq: Once | INTRAVENOUS | Status: AC
  Administered 2017-10-31: 2000 mg via INTRAVENOUS
  Filled 2017-10-31: qty 2000

## 2017-10-31 MED ORDER — VITAL HIGH PROTEIN PO LIQD
1000.0000 mL | ORAL | Status: DC
  Administered 2017-10-31 – 2017-11-02 (×3): 1000 mL

## 2017-10-31 MED ORDER — PIPERACILLIN-TAZOBACTAM 3.375 G IVPB
3.3750 g | Freq: Three times a day (TID) | INTRAVENOUS | Status: DC
  Administered 2017-10-31 – 2017-11-02 (×6): 3.375 g via INTRAVENOUS
  Filled 2017-10-31 (×8): qty 50

## 2017-10-31 MED ORDER — INSULIN DETEMIR 100 UNIT/ML ~~LOC~~ SOLN
10.0000 [IU] | Freq: Every day | SUBCUTANEOUS | Status: DC
  Administered 2017-10-31 – 2017-11-01 (×2): 10 [IU] via SUBCUTANEOUS
  Filled 2017-10-31 (×3): qty 0.1

## 2017-10-31 MED ORDER — PRO-STAT SUGAR FREE PO LIQD
60.0000 mL | Freq: Every day | ORAL | Status: DC
  Administered 2017-10-31 – 2017-11-03 (×14): 60 mL
  Filled 2017-10-31 (×14): qty 60

## 2017-10-31 MED ORDER — VITAL HIGH PROTEIN PO LIQD
1000.0000 mL | ORAL | Status: DC
  Administered 2017-10-31: 1000 mL

## 2017-10-31 NOTE — Progress Notes (Signed)
Pharmacy Antibiotic Note  Daniel [name] is a 52 y.o. male admitted on 10/27/2017 with pneumonia.  Pharmacy has been consulted for Vancomycin and Zosyn dosing.  Plan: Vancomycin 2000 mg IV x 1 today, then Vancomycin 1750 IV every 24 hours.  Goal trough 15-20 mcg/mL. Zosyn 3.375g IV q8h (4 hour infusion).  Height: 5\' 9"  (175.3 cm) Weight: (!) 316 lb 2.2 oz (143.4 kg) IBW/kg (Calculated) : 70.7  Temp (24hrs), Avg:100.8 F (38.2 C), Min:99.5 F (37.5 C), Max:102 F (38.9 C)  Recent Labs  Lab 10/27/17 1557 10/28/17 1113 10/28/17 1311 10/28/17 1656 10/29/17 0409 10/30/17 0530 10/31/17 0547  WBC 7.7  --   --   --  11.5* 8.3 9.3  CREATININE 1.59* 1.44*  --   --  1.92* 1.83* 2.00*  LATICACIDVEN  --   --  0.8 1.3  --   --   --     Estimated Creatinine Clearance: 60.3 mL/min (A) (by C-G formula based on SCr of 2 mg/dL (H)).    Allergies  Allergen Reactions  . Montelukast Other (See Comments)    Possibly cause headaches per spouse    Antimicrobials this admission: Vanc 9/10 >>  Zosyn 9/10 >>   Microbiology results: 9/7 CSF - pending 9/10 BCx: to be collected 9/10 Sputum: to be collected 9/7 MRSA PCR: negative  Thank you for allowing pharmacy to be a part of this patient's care.  Alanda Slim, PharmD, [facility] Clinical Pharmacist Please see AMION for all Pharmacists' Contact Phone Numbers 10/31/2017, 9:37 AM

## 2017-10-31 NOTE — Progress Notes (Signed)
Inpatient Diabetes Program Recommendations  AACE/ADA: New Consensus Statement on Inpatient Glycemic Control (2015)  Target Ranges:  Prepandial:   less than 140 mg/dL      Peak postprandial:   less than 180 mg/dL (1-2 hours)      Critically ill patients:  140 - 180 mg/dL   Lab Results  Component Value Date   GLUCAP 213 (H) 10/31/2017   HGBA1C 15.8 (H) 10/28/2017    Review of Glycemic Control Results for Daniel [name], Daniel [name] (MRN [id number]) as of 10/31/2017 10:00  Ref. Range 10/30/2017 19:46 10/30/2017 23:16 10/31/2017 03:25 10/31/2017 07:31  Glucose-Capillary Latest Ref Range: 70 - 99 mg/dL 198 (H) 202 (H) 231 (H) 213 (H)   Diabetes history: Type 2 DM Outpatient Diabetes medications: Levemir 120 units QD, Metformin 500 mg BID Current orders for Inpatient glycemic control: Novolog 0-20 units Q4H, Levemir 10 units QD Vital 10 ml/hr, Prostat 60 ml/hr  Inpatient Diabetes Program Recommendations:    Noted Levemir 10 units QD to be administered this AM. Consider increasing dose to Levemir 20 units QD (143.4 kg x 0.15).   Consider starting Novolog 5 units Q4H of tube feed coverage (anticipate adjustments once tube feeds are discontinued).   Thanks, Bronson Curb, MSN, RNC-OB Diabetes Coordinator [phone] (8a-5p)

## 2017-10-31 NOTE — Progress Notes (Signed)
vLTM EEG complete. Slight skin breakdown at electrode site F4 and un-blanchable redness at electrode site FP2 (forehead)

## 2017-10-31 NOTE — Progress Notes (Signed)
LTM maintenanced; reprepped F7, no skin breakdown was seen.

## 2017-10-31 NOTE — Progress Notes (Signed)
Daniel [name]  PYK:[id number] DOB: 12-30-1965 DOA: 10/27/2017 PCP: Antonietta Jewel, MD    LOS: 4 days   Reason for Consult / Chief Complaint:  Ventilator management during status epilepticus  Consulting MD and date:  Dr. Leonel Ramsay, neurology 10/28/17  HPI/Summary of hospital stay:  52 yo male presented to [facility] with altered mental status, confusion, headaches, hallucinations, and falls.  SBP was over 200.  In ER at [facility] he was started on esmolol gtt.  Developed seizure in [place] at [place].  He continued to have seizures and transferred to [facility].  He was started on burst suppression with versed and required intubation 9/07.  Developed fever and increased sputum 9/10.  PMHx of IDDM, HTN, OSA, OHS, and seizures.  Subjective:  Febrile overnight.  Objective   Blood pressure (!) 175/102, pulse (!) 119, temperature (!) 102 F (38.9 C), temperature source Oral, resp. rate (!) 27, height 5\' 9"  (1.753 m), weight (!) 143.4 kg, SpO2 95 %.    Vent Mode: PRVC FiO2 (%):  [30 %] 30 % Set Rate:  [24 bmp] 24 bmp Vt Set:  [570 mL] 570 mL PEEP:  [5 cmH20] 5 cmH20 Plateau Pressure:  [18 cmH20-24 cmH20] 24 cmH20   Intake/Output Summary (Last 24 hours) at 10/31/2017 0905 Last data filed at 10/31/2017 0700 Gross per 24 hour  Intake 1544.16 ml  Output 2500 ml  Net -955.84 ml   Filed Weights   10/28/17 1100 10/30/17 0500 10/31/17 0500  Weight: (!) 139.1 kg (!) 139 kg (!) 143.4 kg    Examination:  General - sedated Eyes - pupils reactive ENT - ETT in place Cardiac - regular rate/rhythm, tachycardic, no murmur Chest - b/l rhonchi Abdomen - soft, non tender, + bowel sounds GU - foley in place Extremities - no cyanosis, clubbing, or edema Skin - no rashes Neuro - RASS -2  CXR 9/10 (reviewed by me) >> low lung volumes with Rt > Lt infiltrate  Consults: date of consult/date signed off & final recs:    Procedures: ETT 9/07 >> Lt IJ CVL 9/07 >>   Significant Diagnostic  Tests: EEG 9/07 >> focal non convulsive status epilepticus from Rt occipital region LP 9/08 >> RBC 99, WBC 4, glucose 104, protein 81  Micro Data: CSF 9/07 >>  CSF HSV 9/07 >>  Blood 9/10 >> Sputum 9/10 >>  Antimicrobials:  Vancomycin 9/10 >> Zosyn 9/10 >>  Resolved Hospital Problem list    Assessment & Plan:   Status epilepticus felt to be from hyperglycemic hemianopia. - AEDs per neurology - f/u HSV from CSF 9/07 - RASS goal -1 to -2  Acute hypoxic respiratory failure with compromised airway. OSA, OHS, obstructive lung disease. - full vent support - continue BDs, singulair  Fever with respiratory secretions and infiltrate on CXR. - likely has HCAP - add vancomycin, zosyn - check blood, sputum culture - check procalcitonin  DM type II. - SSI - add levemir 9/10  CAD,HLD. HTN emergency with chronic diastolic CHF. - continue ASA, hydralazine, lisinopril, demadex  CKD 2. - foley clogged with sediment 9/09 - baseline creatinine 1.35 - irrigate foley - monitor urine outpt, renal fx  Hypothyroidism. - continue synthroid   Disposition / Summary of Today's Plan 10/31/17   Neuro to decide about EEG monitoring.  Likely has HCAP.  Adding ABx.  Need to continue sedation for vent synchrony.    Best Practice / Goals of Care / Disposition.   DVT prophylaxis: SQ heparin GI  prophylaxis: protonix Diet: tube feeds Mobility: bed rest Code Status: full code  Family Communication: updated pt's mother at bedside  Labs   CBC: Recent Labs  Lab 10/27/17 1557 10/29/17 0409 10/30/17 0530 10/31/17 0547  WBC 7.7 11.5* 8.3 9.3  NEUTROABS 5.2 7.8*  --   --   HGB 13.6 12.4* 11.9* 11.9*  HCT 42.7 42.1 39.3 39.4  MCV 88.4 93.6 91.8 91.8  PLT 291 359 245 948   Basic Metabolic Panel: Recent Labs  Lab 10/27/17 1557 10/28/17 1113 10/29/17 0409 10/29/17 1300 10/29/17 1648 10/30/17 0530 10/30/17 1714 10/31/17 0547  NA 141 140 141  --   --  142  --  140  K 4.5 4.2 4.7   --   --  4.3  --  3.8  CL 108 107 113*  --   --  114*  --  110  CO2 26 25 21*  --   --  21*  --  21*  GLUCOSE 195* 166* 103*  --   --  225*  --  245*  BUN 29* 24* 28*  --   --  35*  --  46*  CREATININE 1.59* 1.44* 1.92*  --   --  1.83*  --  2.00*  CALCIUM 8.5* 8.4* 7.4*  --   --  7.8*  --  7.9*  MG 2.2  --  1.8 1.6* 1.7 1.9 1.9  --   PHOS 4.9*  --   --  4.4 4.6 3.2 3.1  --    GFR: Estimated Creatinine Clearance: 60.3 mL/min (A) (by C-G formula based on SCr of 2 mg/dL (H)). Recent Labs  Lab 10/27/17 1557 10/28/17 1311 10/28/17 1656 10/29/17 0409 10/30/17 0530 10/31/17 0547  WBC 7.7  --   --  11.5* 8.3 9.3  LATICACIDVEN  --  0.8 1.3  --   --   --    Liver Function Tests: Recent Labs  Lab 10/27/17 1557 10/28/17 1113 10/28/17 1406 10/29/17 0409 10/30/17 0530 10/31/17 0547  AST 23 18  --  18 15 17   ALT 25 23  --  21 20 18   ALKPHOS 59 63  --  57 54 61  BILITOT 0.7 0.9  --  0.6 0.8 0.5  PROT 5.8* 5.7*  --  5.0* 5.0* 5.4*  ALBUMIN 2.7* 2.7* 2.7* 2.3* 2.2* 2.1*   No results for input(s): LIPASE, AMYLASE in the last 168 hours. No results for input(s): AMMONIA in the last 168 hours. ABG    Component Value Date/Time   PHART 7.328 (L) 10/30/2017 0502   PCO2ART 37.3 10/30/2017 0502   PO2ART 127.0 (H) 10/30/2017 0502   HCO3 19.3 (L) 10/30/2017 0502   TCO2 20 (L) 10/30/2017 0502   ACIDBASEDEF 6.0 (H) 10/30/2017 0502   O2SAT 99.0 10/30/2017 0502    Coagulation Profile: Recent Labs  Lab 10/29/17 0409  INR 1.06   Cardiac Enzymes: No results for input(s): CKTOTAL, CKMB, CKMBINDEX, TROPONINI in the last 168 hours. HbA1C: Hgb A1c MFr Bld  Date/Time Value Ref Range Status  10/28/2017 11:13 AM 15.8 (H) 4.8 - 5.6 % Final    Comment:    (NOTE) Pre diabetes:          5.7%-6.4% Diabetes:              >6.4% Glycemic control for   <7.0% adults with diabetes    CBG: Recent Labs  Lab 10/30/17 1622 10/30/17 1946 10/30/17 2316 10/31/17 0325 10/31/17 0731  GLUCAP 201*  198*  202* 231* 213*   CC time 34 minutes  Chesley Mires, MD [facility] Pulmonary/Critical Care 10/31/2017, 9:05 AM

## 2017-10-31 NOTE — Progress Notes (Addendum)
NEUROLOGY PROGRESS NOTE  Subjective: Patient continues to be intubated, he is breathing over the vent, does not respond to noxious or verbal stimuli.  He is maxed out on propofol @ 50 mcg/kg/min.  No further seizures.  No longer on Versed. Thick mucous appears when suctioning.  Exam: Vitals:   10/31/17 0804 10/31/17 0855  BP:  (!) 175/102  Pulse:  (!) 119  Resp:  (!) 27  Temp:    SpO2: 94% 95%  Physical Exam  HEENT-  Normocephalic, no lesions, without obvious abnormality.  Normal external eye and conjunctiva.   Extremities- Warm, dry and intact Musculoskeletal-no joint tenderness, deformity or swelling Skin-warm and dry, no hyperpigmentation, vitiligo, or suspicious lesions Neuro: Propofol 21mg/kg/min Mental Status: Patient is intubated and sedated with propofol. LTM continues at this time.  Cranial Nerves: II: PERRL, Corneals intact, No blink to threat III,IV, VI: Doll's eyes intact bilaterally pupils equal, round, reactive to light and accommodation V,VII: Intubated with no reaction to noxious stimuli face Cough and gag intact. Motor: Flaccid throughout Sensory: No response to noxious stimuli Deep Tendon Reflexes: Depressed throughout plantars: Right: muted   Left: muted Cerebellar: UTA sedated/intubated Gait: UTA sedated/intubated  Medications:  Scheduled: . aspirin  81 mg Per Tube Daily  . chlorhexidine gluconate (MEDLINE KIT)  15 mL Mouth Rinse BID  . feeding supplement (PRO-STAT SUGAR FREE 64)  30 mL Per Tube BID  . feeding supplement (VITAL HIGH PROTEIN)  1,000 mL Per Tube Q24H  . fluticasone furoate-vilanterol  1 puff Inhalation Daily  . heparin injection (subcutaneous)  5,000 Units Subcutaneous Q8H  . hydrALAZINE  25 mg Oral Q6H  . insulin aspart  0-20 Units Subcutaneous Q4H  . levothyroxine  50 mcg Oral QAC breakfast  . lisinopril  10 mg Oral Daily  . mouth rinse  15 mL Mouth Rinse 10 times per day  . montelukast  10 mg Oral QHS  . phenytoin (DILANTIN) IV   100 mg Intravenous Q8H  . torsemide  20 mg Oral Daily   Continuous: . sodium chloride 50 mL/hr at 10/30/17 0700  . famotidine (PEPCID) IV Stopped (10/30/17 0020)  . lacosamide (VIMPAT) IV  200 mg twice daily  . midazolam (VERSED) infusion  stopped  . phenylephrine (NEO-SYNEPHRINE) Adult infusion 70 mcg/min (10/29/17 0620)  . propofol (DIPRIVAN) infusion 30 mcg/kg/min (10/29/17 05625   PWLS:LHTDSKAJGOTLX(TYLENOL) oral liquid 160 mg/5 mL, albuterol, bisacodyl, docusate, fentaNYL (SUBLIMAZE) injection, hydrALAZINE, midazolam  Pertinent Labs/Diagnostics:  cEEG day 4 10/31/17 This EEG is indicative of a severe diffuse encephalopathy, but non-specific as to etiology. No epileptiform discharges or seizures, and no clearly focal or lateralizing signs are seen.   The recording stops unexpectedly at 01:43.  Current glucose 245 Creatinine 2.00 ( trending up) BUN 46 ( trending up) Calcium 7.9 Adjusted to Dilantin 10.2 on  10/30/17 not re-checked today.  Results for DMICHELL, KADER(MRN [id number] as of 10/30/2017 09:02  Ref. Range 10/28/2017 14:05 10/28/2017 14:06  Appearance, CSF Latest Ref Range: CLEAR  CLEAR (A)   Glucose, CSF Latest Ref Range: 40 - 70 mg/dL  104 (H)  RBC Count, CSF Latest Ref Range: 0 /cu mm 99 (H)   WBC, CSF Latest Ref Range: 0 - 5 /cu mm 4   Segmented Neutrophils-CSF Latest Ref Range: 0 - 6 % 10 (H)   Lymphs, CSF Latest Ref Range: 40 - 80 % 60   Monocyte-Macrophage-Spinal Fluid Latest Ref Range: 15 - 45 % 30   Color, CSF Latest Ref Range:  COLORLESS  COLORLESS   Supernatant Unknown COLORLESS   Total  Protein, CSF Latest Ref Range: 15 - 45 mg/dL  81 (H)  Tube # Unknown 1    Laurey Morale, MSN, NP-C [facility] [phone]  Attending Neurologist note to follow:  Assessment: 52 year old with 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      These were likely partial seizures also causing hemianopsia. Lack of control with 3 agents led to burst suppression with versed and propofol Versed 10/30/17 and EEG since showed no further seizures. Dilantin at this point is 10.2 and at the lower limits of normal.   Prolonged EEG shows no further seizures.   Impression:  -Hyperglycemia -Seizure  Recommendations: -continue Keppra 1g BID, Vimpat 200 BID, Dilantin 100 q8h (will request pharmacy consult for dilantin) -Can consider low dose benzos if breakthrough seizures occur. Call neurology for any clinical seizures. -D/C LTM today -Management of toxic metabolic derangements per primary team as you are --Neurology will continue to follow  Discussed the plan in person with the patient's family at the bedside as well as Dr. Halford Chessman from [facility].  CRITICAL CARE ATTESTATION This patient is critically ill and at significant risk of neurological worsening, death and care requires constant monitoring of vital signs, hemodynamics,respiratory and cardiac monitoring. I spent 25  minutes of neurocritical care time performing neurological assessment, discussion with family, other specialists and medical decision making of high complexityin the care of  this patient.

## 2017-10-31 NOTE — Procedures (Signed)
   [place] __________________  Video EEG Monitoring Report     Dates of recording: 10/30/2017 @07 :30 to 10/31/2017 @01 :43   Recording day: 4 (started on 9/6)   Interpreting physician: Beather Arbour Evoleth Nordmeyer, MD       CPT: [phone]   ICD-10: R56.9          Present History: 52year old withencephalopathy, hallucinations, seizures. Continuous VEEG requested to evaluate for seizures.  EEG Details: Routine Video EEG was performed using standard setting per the guidelines of American Clinical Neurophysiology Society (ACNS). A minimum of 21 electrodes were placed on scalp according to the International 10-20 or 10-10 system. Supplemental electrodes were placed as needed. Single EKG electrode was also used to detect cardiac arrhythmia. Recording was performed at a sampling rate of at least 256 Hz. Patient's behavior was continuously recorded on video simultaneously with EEG. A minimum of 18 channels were used for data display. Each epoch of study was reviewed manually daily and as needed using standard digital review software allowing for montage reformatting, gain and filter changes on a display system of sufficient resolution to prevent aliasing. Computerized quantitative EEG analysis (such as compressed spectral array analysis, dipole analysis, trending, automated spike & seizure detection) was used as indicated.  Description of EEG features: State of patient: Coma  Background Activity Amplitude: medium-high, symmetric   Dominant activity: A dominant rhythm of 4 Hz which is symmetric and diffuse, without antero-posterior gradient.   Superimposed Frequencies: High amplitude periodic <1 Hz delta waveforms and intermittent spindly alpha range activity which is maximum central/parasagittal.  Reactivity to stimulation: unclear  Sleep: No normal sleep architecture is recorded.   Nonepileptiform abnormalities:  1. Continuous slow, generalized  Periodic or rhythmic  abnormalities: none  Epileptiform discharges: none  Paroxysmal events or seizures: none  Push button events: none  EKG: 90 bpm and regular   Impression: This EEG is indicative of a severe diffuse encephalopathy, but non-specific as to etiology. No epileptiform discharges or seizures, and no clearly focal or lateralizing signs are seen.   The recording stops unexpectedly at 01:43.

## 2017-11-01 ENCOUNTER — Inpatient Hospital Stay (HOSPITAL_COMMUNITY): Payer: Medicaid Other

## 2017-11-01 LAB — CBC
HEMATOCRIT: 37.1 % — AB (ref 39.0–52.0)
HEMOGLOBIN: 11 g/dL — AB (ref 13.0–17.0)
MCH: 27.5 pg (ref 26.0–34.0)
MCHC: 29.6 g/dL — ABNORMAL LOW (ref 30.0–36.0)
MCV: 92.8 fL (ref 78.0–100.0)
Platelets: 261 10*3/uL (ref 150–400)
RBC: 4 MIL/uL — AB (ref 4.22–5.81)
RDW: 14.7 % (ref 11.5–15.5)
WBC: 8.8 10*3/uL (ref 4.0–10.5)

## 2017-11-01 LAB — MAGNESIUM
Magnesium: 2.1 mg/dL (ref 1.7–2.4)
Magnesium: 2 mg/dL (ref 1.7–2.4)

## 2017-11-01 LAB — GLUCOSE, CAPILLARY
GLUCOSE-CAPILLARY: 218 mg/dL — AB (ref 70–99)
GLUCOSE-CAPILLARY: 256 mg/dL — AB (ref 70–99)
Glucose-Capillary: 197 mg/dL — ABNORMAL HIGH (ref 70–99)
Glucose-Capillary: 215 mg/dL — ABNORMAL HIGH (ref 70–99)
Glucose-Capillary: 221 mg/dL — ABNORMAL HIGH (ref 70–99)
Glucose-Capillary: 236 mg/dL — ABNORMAL HIGH (ref 70–99)

## 2017-11-01 LAB — BASIC METABOLIC PANEL
ANION GAP: 10 (ref 5–15)
BUN: 55 mg/dL — ABNORMAL HIGH (ref 6–20)
CO2: 21 mmol/L — ABNORMAL LOW (ref 22–32)
Calcium: 7.8 mg/dL — ABNORMAL LOW (ref 8.9–10.3)
Chloride: 111 mmol/L (ref 98–111)
Creatinine, Ser: 2.14 mg/dL — ABNORMAL HIGH (ref 0.61–1.24)
GFR, EST AFRICAN AMERICAN: 39 mL/min — AB (ref >60)
GFR, EST NON AFRICAN AMERICAN: 34 mL/min — AB (ref >60)
Glucose, Bld: 234 mg/dL — ABNORMAL HIGH (ref 70–99)
POTASSIUM: 3.8 mmol/L (ref 3.5–5.1)
Sodium: 142 mmol/L (ref 135–145)

## 2017-11-01 LAB — CULTURE, RESPIRATORY W GRAM STAIN

## 2017-11-01 LAB — PHOSPHORUS
PHOSPHORUS: 4.8 mg/dL — AB (ref 2.5–4.6)
Phosphorus: 4.6 mg/dL (ref 2.5–4.6)

## 2017-11-01 LAB — PROCALCITONIN: Procalcitonin: 0.54 ng/mL

## 2017-11-01 LAB — PHENYTOIN LEVEL, TOTAL: PHENYTOIN LVL: 3 ug/mL — AB (ref 10.0–20.0)

## 2017-11-01 MED ORDER — METOPROLOL TARTRATE 25 MG/10 ML ORAL SUSPENSION
25.0000 mg | Freq: Two times a day (BID) | ORAL | Status: DC
  Administered 2017-11-01 – 2017-11-03 (×5): 25 mg
  Filled 2017-11-01 (×5): qty 10

## 2017-11-01 MED ORDER — FUROSEMIDE 10 MG/ML IJ SOLN
40.0000 mg | Freq: Two times a day (BID) | INTRAMUSCULAR | Status: DC
  Administered 2017-11-01 – 2017-11-03 (×4): 40 mg via INTRAVENOUS
  Filled 2017-11-01 (×5): qty 4

## 2017-11-01 MED ORDER — POTASSIUM CHLORIDE 20 MEQ/15ML (10%) PO SOLN
40.0000 meq | Freq: Once | ORAL | Status: AC
  Administered 2017-11-01: 40 meq
  Filled 2017-11-01: qty 30

## 2017-11-01 MED ORDER — SODIUM CHLORIDE 0.9 % IV SOLN
135.0000 mg | Freq: Three times a day (TID) | INTRAVENOUS | Status: DC
  Administered 2017-11-01 – 2017-11-06 (×16): 135 mg via INTRAVENOUS
  Filled 2017-11-01 (×22): qty 2.7

## 2017-11-01 NOTE — Progress Notes (Signed)
Nutrition Follow-up  DOCUMENTATION CODES:   Morbid obesity  INTERVENTION:    Vital High Protein at 10 ml/h (240 ml per day)  Pro-stat 60 ml 5 times per day   Provides 1240 kcal (1900 kcal total with current Propofol rate), 171 gm protein, 201 ml free water daily  NUTRITION DIAGNOSIS:   Inadequate oral intake related to inability to eat as evidenced by NPO status.  GOAL:   Provide needs based on ASPEN/SCCM guidelines  MONITOR:   Vent status, TF tolerance, Labs, I & O's, Skin  REASON FOR ASSESSMENT:   Consult Enteral/tube feeding initiation and management  ASSESSMENT:   52 yo male with PMH of IDDM, HTN, OSA, OHS, and seizures who was admitted to [facility] on 9/6 with AMS. Had multiple seizures and was transferred to [facility]; required intubation 9/7.  Pt discussed during ICU rounds and with RN.   Patient is currently intubated on ventilator support MV: 17.8 L/min Temp (24hrs), Avg:100.2 F (37.9 C), Min:97.6 F (36.4 C), Max:102.2 F (39 C)  Propofol: 25 ml/hr providing 660 kcal per day  Labs reviewed: PO4: 4.8 (H) CBG's: 197-256 Medications reviewed and include: lasix, SSI, levemir    Diet Order:   Diet Order            Diet NPO time specified  Diet effective now              EDUCATION NEEDS:   No education needs have been identified at this time  Skin:  Skin Assessment: Reviewed RN Assessment  Last BM:  9/11 large   Height:   Ht Readings from Last 1 Encounters:  10/28/17 5\' 9"  (1.753 m)    Weight:   Wt Readings from Last 1 Encounters:  11/01/17 (!) 142 kg    Ideal Body Weight:  72.7 kg  BMI:  Body mass index is 46.23 kg/m.  Estimated Nutritional Needs:   Kcal:  1525-1950  Protein:  145-182 grams  Fluid:  >/= 2 L  Maylon Peppers RD, LDN, CNSC [phone] Pager [phone] After Hours Pager

## 2017-11-01 NOTE — Progress Notes (Signed)
MEDICATION RELATED CONSULT NOTE - FOLLOW UP   Pharmacy Consult for Phenytoin Indication: Seizures  Allergies  Allergen Reactions  . Montelukast Other (See Comments)    Possibly cause headaches per spouse    Patient Measurements: Height: 5\' 9"  (175.3 cm) Weight: (!) 313 lb 0.9 oz (142 kg) IBW/kg (Calculated) : 70.7 Vital Signs: Temp: 102.2 F (39 C) (09/11 1600) Temp Source: Axillary (09/11 1600) BP: 106/65 (09/11 1543) Pulse Rate: 81 (09/11 1543) Intake/Output from previous day: 09/10 0701 - 09/11 0700 In: 2443.9 [I.V.:1161.7; NG/GT:319.6; IV Piggyback:962.6] Out: 1750 [Urine:1750] Intake/Output from this shift: Total I/O In: 974.3 [I.V.:189.1; NG/GT:90; IV Piggyback:695.2] Out: 2550 [Urine:2550]  Labs: Recent Labs    10/30/17 0530  10/31/17 0547 10/31/17 0905 10/31/17 1812 11/01/17 0503  WBC 8.3  --  9.3  --   --  8.8  HGB 11.9*  --  11.9*  --   --  11.0*  HCT 39.3  --  39.4  --   --  37.1*  PLT 245  --  269  --   --  261  CREATININE 1.83*  --  2.00*  --   --  2.14*  MG 1.9   < >  --  2.0 2.0 2.1  PHOS 3.2   < >  --  4.0 4.2 4.8*  ALBUMIN 2.2*  --  2.1*  --   --   --   PROT 5.0*  --  5.4*  --   --   --   AST 15  --  17  --   --   --   ALT 20  --  18  --   --   --   ALKPHOS 54  --  61  --   --   --   BILITOT 0.8  --  0.5  --   --   --    < > = values in this interval not displayed.   Estimated Creatinine Clearance: 56 mL/min (A) (by C-G formula based on SCr of 2.14 mg/dL (H)).   Microbiology: Recent Results (from the past 720 hour(s))  MRSA PCR Screening     Status: None   Collection Time: 10/28/17 10:55 AM  Result Value Ref Range Status   MRSA by PCR NEGATIVE NEGATIVE Final    Comment:        The GeneXpert MRSA Assay (FDA approved for NASAL specimens only), is one component of a comprehensive MRSA colonization surveillance program. It is not intended to diagnose MRSA infection nor to guide or monitor treatment for MRSA infections. Performed at  [facility], [place] [address] [place], [place] [address]   CSF culture     Status: None   Collection Time: 10/28/17  1:46 PM  Result Value Ref Range Status   Specimen Description CSF  Final   Special Requests NONE  Final   Gram Stain   Final    WBC PRESENT, PREDOMINANTLY MONONUCLEAR NO ORGANISMS SEEN CYTOSPIN SMEAR    Culture   Final    NO GROWTH 3 DAYS Performed at [facility], [place] [address], [place], [place] [address]    Report Status 10/31/2017 FINAL  Final  Culture, respiratory (non-expectorated)     Status: None   Collection Time: 10/31/17  9:01 AM  Result Value Ref Range Status   Specimen Description TRACHEAL ASPIRATE  Final   Special Requests NONE  Final   Gram Stain   Final    MODERATE WBC PRESENT, PREDOMINANTLY  PMN FEW GRAM NEGATIVE RODS RARE GRAM POSITIVE RODS    Culture   Final    MODERATE HAEMOPHILUS INFLUENZAE BETA LACTAMASE NEGATIVE Performed at [facility], [place] [address] [place], [place] [address]    Report Status 11/01/2017 FINAL  Final  Culture, blood (Routine X 2) w Reflex to ID Panel     Status: None (Preliminary result)   Collection Time: 10/31/17 11:04 AM  Result Value Ref Range Status   Specimen Description BLOOD BLOOD RIGHT HAND  Final   Special Requests   Final    BOTTLES DRAWN AEROBIC ONLY Blood Culture adequate volume   Culture   Final    NO GROWTH 1 DAY Performed at [facility], [place] [address], [place], [place] [address]    Report Status PENDING  Incomplete  Culture, blood (Routine X 2) w Reflex to ID Panel     Status: None (Preliminary result)   Collection Time: 10/31/17 11:08 AM  Result Value Ref Range Status   Specimen Description BLOOD BLOOD RIGHT HAND  Final   Special Requests   Final    BOTTLES DRAWN AEROBIC ONLY Blood Culture results may not be optimal due to an inadequate volume of blood received in culture bottles   Culture   Final    NO GROWTH 1 DAY Performed at [facility], [place] [address], [place], [place] [address]    Report Status PENDING  Incomplete    Assessment: 52 year old male with refractory partial seizures likely secondary to prolonged hyperglycemia. Currently, no further seizures on Propofol at 50 mcg/kg/min, off Midazolam, Keppra, Vimpat, and Phenytoin.   Phenytoin level today was drawn appropriately prior to the 1400 PM dose (RN held dose until level was drawn) on day #6 of therapy.   Phenytoin level is 3 which corrects to 5.76 with low albumin of 2.1 on dose of 300 mg/day (100mg  IV  Every 8 hours). Discussed therapy with Dr. Rory Percy - plan is to increase dose per pharmacy protocol (no further load) and recheck level at steady state.   Goal of Therapy:  Phenytoin level 10-20 mcg/mL  Plan:  Increase Phenytoin to 135 mg IV every 8 hours (increase ~100mg  per protocol).  Recheck level at steady state.  Monitor seizure control and for any signs or symptoms of toxicity.   Sloan Leiter, PharmD, BCPS, BCCCP Clinical Pharmacist Clinical phone 11/01/2017 until 10:30PM - #[phone] Please refer to [facility] for [facility] numbers 11/01/2017,4:50 PM

## 2017-11-01 NOTE — Progress Notes (Signed)
Daniel [name]  KWI:[id number] DOB: May 18, 1965 DOA: 10/27/2017 PCP: Antonietta Jewel, MD    LOS: 5 days   Reason for Consult / Chief Complaint:  Ventilator management during status epilepticus  Consulting MD and date:  Dr. Leonel Ramsay, neurology 10/28/17  HPI/Summary of hospital stay:  52 yo male presented to [facility] with altered mental status, confusion, headaches, hallucinations, and falls.  SBP was over 200.  In ER at [facility] he was started on esmolol gtt.  Developed seizure in [place] at [place].  He continued to have seizures and transferred to [facility].  He was started on burst suppression with versed and required intubation 9/07.  Developed fever and increased sputum 9/10.  PMHx of IDDM, HTN, OSA, OHS, and seizures.  Subjective:  Fever curve better.  BP up.  Increased RR with SBT.  Objective   Blood pressure (!) 182/97, pulse 87, temperature (!) 100.4 F (38 C), temperature source Axillary, resp. rate (!) 31, height 5\' 9"  (1.753 m), weight (!) 142 kg, SpO2 97 %.    Vent Mode: PSV;CPAP FiO2 (%):  [30 %-40 %] 40 % Set Rate:  [24 bmp] 24 bmp Vt Set:  [570 mL] 570 mL PEEP:  [5 cmH20] 5 cmH20 Pressure Support:  [5 cmH20] 5 cmH20 Plateau Pressure:  [13 cmH20-21 cmH20] 21 cmH20   Intake/Output Summary (Last 24 hours) at 11/01/2017 1140 Last data filed at 11/01/2017 0800 Gross per 24 hour  Intake 2262.99 ml  Output 1750 ml  Net 512.99 ml   Filed Weights   10/30/17 0500 10/31/17 0500 11/01/17 0500  Weight: (!) 139 kg (!) 143.4 kg (!) 142 kg    Examination:  General - opens eyes spontaneously Eyes - pupils reactive ENT - ETT in place Cardiac - regular rate/rhythm, tachycardic, no murmur Chest - scattered rhonchi Abdomen - soft, non tender, + bowel sounds GU - foley in Extremities - 1+ edema Skin - no rashes Neuro - not following commands  CXR 9/11 (reviewed by me) >> low lung volumes   Consults: date of consult/date signed off & final recs:    Procedures: ETT  9/07 >> Lt IJ CVL 9/07 >>   Significant Diagnostic Tests: EEG 9/07 >> focal non convulsive status epilepticus from Rt occipital region LP 9/08 >> RBC 99, WBC 4, glucose 104, protein 81  Micro Data: CSF 9/07 >> negative Blood 9/10 >> Sputum 9/10 >>  Antimicrobials:  Vancomycin 9/10 >> Zosyn 9/10 >>  Resolved Hospital Problem list    Assessment & Plan:   Status epilepticus felt to be from hyperglycemic hemianopia. - continue vimpat, keppra, dilantin - RASS goal 0 to -1  Acute hypoxic respiratory failure with compromised airway. OSA, OHS, obstructive lung disease. - pressure support as able - not ready for extubation - pulmicort, singulair, duoneb  HCAP. - day 2 of vancomycin, zosyn  DM type II. - SSI with levemir  CAD,HLD. HTN emergency with chronic diastolic CHF. - continue ASA, hydralazine, lisinopril - lasix 40 mg IV q12h - add lopressor 25 mg bid  CKD 2. - foley clogged with sediment 9/09 - baseline creatinine 1.35 - irrigate foley - monitor urine outpt - keep foley in for now  Hypothyroidism. - continue synthroid   Disposition / Summary of Today's Plan 11/01/17   Starting pressure support trials.  Try to diurese more.  Continue ABx.  Limit sedation.  Best Practice / Goals of Care / Disposition.   DVT prophylaxis: SQ heparin GI prophylaxis: protonix Diet: tube feeds  Mobility: bed rest Code Status: full code  Family Communication: updated family at bedside  Labs   CBC: Recent Labs  Lab 10/27/17 1557 10/29/17 0409 10/30/17 0530 10/31/17 0547 11/01/17 0503  WBC 7.7 11.5* 8.3 9.3 8.8  NEUTROABS 5.2 7.8*  --   --   --   HGB 13.6 12.4* 11.9* 11.9* 11.0*  HCT 42.7 42.1 39.3 39.4 37.1*  MCV 88.4 93.6 91.8 91.8 92.8  PLT 291 359 245 269 175   Basic Metabolic Panel: Recent Labs  Lab 10/28/17 1113 10/29/17 0409  10/30/17 0530 10/30/17 1714 10/31/17 0547 10/31/17 0905 10/31/17 1812 11/01/17 0503  NA 140 141  --  142  --  140  --   --   142  K 4.2 4.7  --  4.3  --  3.8  --   --  3.8  CL 107 113*  --  114*  --  110  --   --  111  CO2 25 21*  --  21*  --  21*  --   --  21*  GLUCOSE 166* 103*  --  225*  --  245*  --   --  234*  BUN 24* 28*  --  35*  --  46*  --   --  55*  CREATININE 1.44* 1.92*  --  1.83*  --  2.00*  --   --  2.14*  CALCIUM 8.4* 7.4*  --  7.8*  --  7.9*  --   --  7.8*  MG  --  1.8   < > 1.9 1.9  --  2.0 2.0 2.1  PHOS  --   --    < > 3.2 3.1  --  4.0 4.2 4.8*   < > = values in this interval not displayed.   GFR: Estimated Creatinine Clearance: 56 mL/min (A) (by C-G formula based on SCr of 2.14 mg/dL (H)). Recent Labs  Lab 10/28/17 1311 10/28/17 1656 10/29/17 0409 10/30/17 0530 10/31/17 0547 10/31/17 0905 11/01/17 0503  PROCALCITON  --   --   --   --   --  0.34 0.54  WBC  --   --  11.5* 8.3 9.3  --  8.8  LATICACIDVEN 0.8 1.3  --   --   --   --   --    Liver Function Tests: Recent Labs  Lab 10/27/17 1557 10/28/17 1113 10/28/17 1406 10/29/17 0409 10/30/17 0530 10/31/17 0547  AST 23 18  --  18 15 17   ALT 25 23  --  21 20 18   ALKPHOS 59 63  --  57 54 61  BILITOT 0.7 0.9  --  0.6 0.8 0.5  PROT 5.8* 5.7*  --  5.0* 5.0* 5.4*  ALBUMIN 2.7* 2.7* 2.7* 2.3* 2.2* 2.1*   No results for input(s): LIPASE, AMYLASE in the last 168 hours. No results for input(s): AMMONIA in the last 168 hours. ABG    Component Value Date/Time   PHART 7.328 (L) 10/30/2017 0502   PCO2ART 37.3 10/30/2017 0502   PO2ART 127.0 (H) 10/30/2017 0502   HCO3 19.3 (L) 10/30/2017 0502   TCO2 20 (L) 10/30/2017 0502   ACIDBASEDEF 6.0 (H) 10/30/2017 0502   O2SAT 99.0 10/30/2017 0502    Coagulation Profile: Recent Labs  Lab 10/29/17 0409  INR 1.06   Cardiac Enzymes: No results for input(s): CKTOTAL, CKMB, CKMBINDEX, TROPONINI in the last 168 hours. HbA1C: Hgb A1c MFr Bld  Date/Time Value Ref Range Status  10/28/2017 11:13  AM 15.8 (H) 4.8 - 5.6 % Final    Comment:    (NOTE) Pre diabetes:          5.7%-6.4% Diabetes:               >6.4% Glycemic control for   <7.0% adults with diabetes    CBG: Recent Labs  Lab 10/31/17 1513 10/31/17 1940 10/31/17 2339 11/01/17 0327 11/01/17 0758  GLUCAP 193* 177* 224* 218* 197*   CC time 33 minutes  Chesley Mires, MD [facility] 11/01/2017, 11:40 AM

## 2017-11-02 ENCOUNTER — Inpatient Hospital Stay (HOSPITAL_COMMUNITY): Payer: Medicaid Other

## 2017-11-02 DIAGNOSIS — J14 Pneumonia due to Hemophilus influenzae: Secondary | ICD-10-CM

## 2017-11-02 LAB — BASIC METABOLIC PANEL
Anion gap: 9 (ref 5–15)
BUN: 66 mg/dL — AB (ref 6–20)
CALCIUM: 8.3 mg/dL — AB (ref 8.9–10.3)
CHLORIDE: 114 mmol/L — AB (ref 98–111)
CO2: 23 mmol/L (ref 22–32)
Creatinine, Ser: 2 mg/dL — ABNORMAL HIGH (ref 0.61–1.24)
GFR calc Af Amer: 42 mL/min — ABNORMAL LOW (ref >60)
GFR calc non Af Amer: 36 mL/min — ABNORMAL LOW (ref >60)
GLUCOSE: 241 mg/dL — AB (ref 70–99)
Potassium: 4.2 mmol/L (ref 3.5–5.1)
Sodium: 146 mmol/L — ABNORMAL HIGH (ref 135–145)

## 2017-11-02 LAB — PHENYTOIN LEVEL, FREE AND TOTAL
PHENYTOIN FREE: NOT DETECTED ug/mL (ref 1.0–2.0)
Phenytoin, Total: 1.6 ug/mL — ABNORMAL LOW (ref 10.0–20.0)

## 2017-11-02 LAB — GLUCOSE, CAPILLARY
GLUCOSE-CAPILLARY: 230 mg/dL — AB (ref 70–99)
GLUCOSE-CAPILLARY: 325 mg/dL — AB (ref 70–99)
Glucose-Capillary: 233 mg/dL — ABNORMAL HIGH (ref 70–99)
Glucose-Capillary: 331 mg/dL — ABNORMAL HIGH (ref 70–99)
Glucose-Capillary: 286 mg/dL — ABNORMAL HIGH (ref 70–99)
Glucose-Capillary: 247 mg/dL — ABNORMAL HIGH (ref 70–99)

## 2017-11-02 LAB — CBC
HCT: 40.4 % (ref 39.0–52.0)
Hemoglobin: 12 g/dL — ABNORMAL LOW (ref 13.0–17.0)
MCH: 27.9 pg (ref 26.0–34.0)
MCHC: 29.7 g/dL — AB (ref 30.0–36.0)
MCV: 94 fL (ref 78.0–100.0)
Platelets: 274 10*3/uL (ref 150–400)
RBC: 4.3 MIL/uL (ref 4.22–5.81)
RDW: 14.7 % (ref 11.5–15.5)
WBC: 8.7 10*3/uL (ref 4.0–10.5)

## 2017-11-02 LAB — C DIFFICILE QUICK SCREEN W PCR REFLEX
C DIFFICILE (CDIFF) TOXIN: NEGATIVE
C DIFFICLE (CDIFF) ANTIGEN: NEGATIVE
C Diff interpretation: NOT DETECTED

## 2017-11-02 LAB — PROCALCITONIN: Procalcitonin: 0.49 ng/mL

## 2017-11-02 MED ORDER — SODIUM CHLORIDE 0.9 % IV SOLN
INTRAVENOUS | Status: DC
  Administered 2017-11-03: 2.7 [IU]/h via INTRAVENOUS
  Administered 2017-11-03: 10.8 [IU]/h via INTRAVENOUS
  Administered 2017-11-04: 3 [IU]/h via INTRAVENOUS
  Filled 2017-11-02 (×3): qty 1

## 2017-11-02 MED ORDER — CHLORHEXIDINE GLUCONATE CLOTH 2 % EX PADS
6.0000 | MEDICATED_PAD | Freq: Every day | CUTANEOUS | Status: DC
  Administered 2017-11-02: 6 via TOPICAL

## 2017-11-02 MED ORDER — SODIUM CHLORIDE 0.9 % IV SOLN
1.0000 g | INTRAVENOUS | Status: DC
  Administered 2017-11-02 – 2017-11-07 (×6): 1 g via INTRAVENOUS
  Filled 2017-11-02 (×6): qty 10

## 2017-11-02 MED ORDER — SODIUM CHLORIDE 0.9% FLUSH: 10.0000 mL | INTRAVENOUS | Status: DC | PRN

## 2017-11-02 MED ORDER — SODIUM CHLORIDE 0.9% FLUSH
10.0000 mL | Freq: Two times a day (BID) | INTRAVENOUS | Status: DC
  Administered 2017-11-03: 10 mL
  Administered 2017-11-03: 20 mL
  Administered 2017-11-04: 10 mL

## 2017-11-02 MED ORDER — INSULIN DETEMIR 100 UNIT/ML ~~LOC~~ SOLN
14.0000 [IU] | Freq: Every day | SUBCUTANEOUS | Status: DC
  Administered 2017-11-02: 14 [IU] via SUBCUTANEOUS
  Filled 2017-11-02 (×2): qty 0.14

## 2017-11-02 NOTE — Progress Notes (Signed)
Daniel [name]  YIF:[id number] DOB: 01-02-1966 DOA: 10/27/2017 PCP: Antonietta Jewel, MD    LOS: 6 days   Reason for Consult / Chief Complaint:  Ventilator management during status epilepticus  Consulting MD and date:  Dr. Leonel Ramsay, neurology 10/28/17  HPI/Summary of hospital stay:  52 yo male presented to [facility] with altered mental status, confusion, headaches, hallucinations, and falls.  SBP was over 200.  In ER at [facility] he was started on esmolol gtt.  Developed seizure in [place] at [place].  He continued to have seizures and transferred to [facility].  He was started on burst suppression with versed and required intubation 9/07.  Developed fever and increased sputum 9/10.  PMHx of IDDM, HTN, OSA, OHS, and seizures.  Subjective:  Fever curve better.  Tolerating some pressure support.  Having increased volume of diarrhea.  Objective   Blood pressure 138/75, pulse 81, temperature 99.6 F (37.6 C), temperature source Oral, resp. rate (!) 29, height 5\' 9"  (1.753 m), weight (!) 143 kg, SpO2 96 %.    Vent Mode: PSV;CPAP FiO2 (%):  [40 %] 40 % Set Rate:  [16 bmp] 16 bmp Vt Set:  [570 mL] 570 mL PEEP:  [5 cmH20] 5 cmH20 Pressure Support:  [5 cmH20-10 cmH20] 8 cmH20 Plateau Pressure:  [14 cmH20-22 cmH20] 14 cmH20   I/O last 3 completed shifts: In: 2797 [I.V.:1310.1; NG/GT:170; IV Piggyback:1317] Out: 8676 [Urine:5575]  Examination:  General - sedated Eyes - pupils reactive ENT - ETT in place Cardiac - regular rate/rhythm, no murmur Chest - equal breath sounds b/l, no wheezing or rales Abdomen - soft, non tender, + bowel sounds GU - no lesions noted Extremities - 1+ edema Skin - no rashes Neuro - RASS -3  CXR 9/12 (reviewed by me) >> decreased edema   Consults: date of consult/date signed off & final recs:    Procedures: ETT 9/07 >> Lt IJ CVL 9/07 >>   Significant Diagnostic Tests: EEG 9/07 >> focal non convulsive status epilepticus from Rt occipital region LP  9/08 >> RBC 99, WBC 4, glucose 104, protein 81  Micro Data: CSF 9/07 >> negative Blood 9/10 >> Sputum 9/10 >> Haemophilus influenzae C diff PCR 9/12 >>   Antimicrobials:  Vancomycin 9/10 >> 9/12 Zosyn 9/10 >> 9/12 Rocephin 9/12 >>   Resolved Hospital Problem list    Assessment & Plan:   Status epilepticus felt to be from hyperglycemic hemianopia. - continue vimpat, keppra, dilatin - RASS goal 0  Acute hypoxic respiratory failure with compromised airway. OSA, OHS, obstructive lung disease. - pressure support wean as able - getting closer to extubation trial once mental status better - continue pulmicort, singulair, duoneb  CAP from Haemophilus influenzae. - day 3 of ABx - change to rocephin 9/12  Diarrhea. - f/u C diff PCR  DM type II. - SS - change levemir to 14 units daily  CAD,HLD. HTN emergency with chronic diastolic CHF. - continue ASA, hydralazine, lopressor - hold lisinopril with elevated creatinine - lasix 40 mg IV q12h  CKD 2. - foley clogged with sediment 9/09 - baseline creatinine 1.35 - irrigate foley - monitor urine outpt - keep foley in for now  Hypothyroidism. - continue synthroid   Disposition / Summary of Today's Plan 11/02/17   Continue pressure support weaning.  Adjust ABx based on sputum culture.  Try to limit sedation as able.  F/u C diff PCR.  Push diuresis again today.  Best Practice / Goals of Care /  Disposition.   DVT prophylaxis: SQ heparin GI prophylaxis: protonix Diet: tube feeds Mobility: bed rest Code Status: full code  Family Communication: no family at bedside  Labs   CBC: Recent Labs  Lab 10/27/17 1557 10/29/17 0409 10/30/17 0530 10/31/17 0547 11/01/17 0503 11/02/17 0500  WBC 7.7 11.5* 8.3 9.3 8.8 8.7  NEUTROABS 5.2 7.8*  --   --   --   --   HGB 13.6 12.4* 11.9* 11.9* 11.0* 12.0*  HCT 42.7 42.1 39.3 39.4 37.1* 40.4  MCV 88.4 93.6 91.8 91.8 92.8 94.0  PLT 291 359 245 269 261 053   Basic Metabolic  Panel: Recent Labs  Lab 10/29/17 0409  10/30/17 0530 10/30/17 1714 10/31/17 0547 10/31/17 0905 10/31/17 1812 11/01/17 0503 11/01/17 1700 11/02/17 0500  NA 141  --  142  --  140  --   --  142  --  146*  K 4.7  --  4.3  --  3.8  --   --  3.8  --  4.2  CL 113*  --  114*  --  110  --   --  111  --  114*  CO2 21*  --  21*  --  21*  --   --  21*  --  23  GLUCOSE 103*  --  225*  --  245*  --   --  234*  --  241*  BUN 28*  --  35*  --  46*  --   --  55*  --  66*  CREATININE 1.92*  --  1.83*  --  2.00*  --   --  2.14*  --  2.00*  CALCIUM 7.4*  --  7.8*  --  7.9*  --   --  7.8*  --  8.3*  MG 1.8   < > 1.9 1.9  --  2.0 2.0 2.1 2.0  --   PHOS  --    < > 3.2 3.1  --  4.0 4.2 4.8* 4.6  --    < > = values in this interval not displayed.   GFR: Estimated Creatinine Clearance: 60.2 mL/min (A) (by C-G formula based on SCr of 2 mg/dL (H)). Recent Labs  Lab 10/28/17 1311 10/28/17 1656  10/30/17 0530 10/31/17 0547 10/31/17 0905 11/01/17 0503 11/02/17 0500  PROCALCITON  --   --   --   --   --  0.34 0.54 0.49  WBC  --   --    < > 8.3 9.3  --  8.8 8.7  LATICACIDVEN 0.8 1.3  --   --   --   --   --   --    < > = values in this interval not displayed.   Liver Function Tests: Recent Labs  Lab 10/27/17 1557 10/28/17 1113 10/28/17 1406 10/29/17 0409 10/30/17 0530 10/31/17 0547  AST 23 18  --  18 15 17   ALT 25 23  --  21 20 18   ALKPHOS 59 63  --  57 54 61  BILITOT 0.7 0.9  --  0.6 0.8 0.5  PROT 5.8* 5.7*  --  5.0* 5.0* 5.4*  ALBUMIN 2.7* 2.7* 2.7* 2.3* 2.2* 2.1*   No results for input(s): LIPASE, AMYLASE in the last 168 hours. No results for input(s): AMMONIA in the last 168 hours. ABG    Component Value Date/Time   PHART 7.328 (L) 10/30/2017 0502   PCO2ART 37.3 10/30/2017 0502   PO2ART 127.0 (H) 10/30/2017 0502  HCO3 19.3 (L) 10/30/2017 0502   TCO2 20 (L) 10/30/2017 0502   ACIDBASEDEF 6.0 (H) 10/30/2017 0502   O2SAT 99.0 10/30/2017 0502    Coagulation Profile: Recent Labs   Lab 10/29/17 0409  INR 1.06   Cardiac Enzymes: No results for input(s): CKTOTAL, CKMB, CKMBINDEX, TROPONINI in the last 168 hours. HbA1C: Hgb A1c MFr Bld  Date/Time Value Ref Range Status  10/28/2017 11:13 AM 15.8 (H) 4.8 - 5.6 % Final    Comment:    (NOTE) Pre diabetes:          5.7%-6.4% Diabetes:              >6.4% Glycemic control for   <7.0% adults with diabetes    CBG: Recent Labs  Lab 11/01/17 1540 11/01/17 1946 11/01/17 2331 11/02/17 0331 11/02/17 Pearl, MD [place] Pulmonary/Critical Care 11/02/2017, 8:42 AM

## 2017-11-02 NOTE — Progress Notes (Signed)
Neurology Progress Note   S:// Patient seen and examined. No acute changes reported.  O:// Current vital signs: BP 126/72 (BP Location: Right Arm)   Pulse 87   Temp (!) 100.6 F (38.1 C) (Oral)   Resp (!) 28   Ht 5' 9"  (1.753 m)   Wt (!) 143 kg   SpO2 96%   BMI 46.56 kg/m  Vital signs in last 24 hours: Temp:  [99.6 F (37.6 C)-102.2 F (39 C)] 100.6 F (38.1 C) (09/12 0800) Pulse Rate:  [69-108] 87 (09/12 1000) Resp:  [23-35] 28 (09/12 1000) BP: (105-197)/(63-106) 126/72 (09/12 1000) SpO2:  [95 %-100 %] 96 % (09/12 1000) FiO2 (%):  [40 %] 40 % (09/12 0802) Weight:  [143 kg] 143 kg (09/12 0500) Physical Exam  HEENT-  Normocephalic, no lesions, without obvious abnormality.  Normal external eye and conjunctiva.   Extremities- Warm, dry and intact Musculoskeletal-no joint tenderness, deformity or swelling Skin-warm and dry, no hyperpigmentation, vitiligo, or suspicious lesions Neuro:Propofol 7mg/kg/min Mental Status: Patient is intubated and sedated with propofol. Cranial Nerves: II: PERRL, Corneals intact, No blink to threat III,IV, VI: Doll's eyes intact bilaterally pupils equal, round, reactive to light and accommodation V,VII: Intubated with no reaction to noxious stimuli face Cough and gag intact. Motor: Flaccid throughout Sensory: No response to noxious stimuli Deep Tendon Reflexes: Depressed throughout plantars: Right: muted                            Left: muted Cerebellar: UTA sedated/intubated Gait: UTA sedated/intubated  Medications  Current Facility-Administered Medications:  .  0.45 % sodium chloride infusion, , Intravenous, Continuous, SChesley Mires MD, Stopped at 11/02/17 0411 .  acetaminophen (TYLENOL) solution 650 mg, 650 mg, Oral, Q6H PRN, HCorey Harold NP, 650 mg at 11/02/17 0802 .  albuterol (PROVENTIL) (2.5 MG/3ML) 0.083% nebulizer solution 3 mL, 3 mL, Inhalation, Q4H PRN, LJanora Norlander MD .  aspirin chewable tablet 81 mg, 81 mg,  Per Tube, Daily, LJanora Norlander MD, 81 mg at 11/02/17 1011 .  bisacodyl (DULCOLAX) suppository 10 mg, 10 mg, Rectal, Daily PRN, SChesley Mires MD .  budesonide (PULMICORT) nebulizer solution 0.25 mg, 0.25 mg, Nebulization, BID, SHalford Chessman Vineet, MD, 0.25 mg at 11/02/17 0802 .  cefTRIAXone (ROCEPHIN) 1 g in sodium chloride 0.9 % 100 mL IVPB, 1 g, Intravenous, Q24H, Sood, Vineet, MD, Last Rate: 200 mL/hr at 11/02/17 1009, 1 g at 11/02/17 1009 .  chlorhexidine gluconate (MEDLINE KIT) (PERIDEX) 0.12 % solution 15 mL, 15 mL, Mouth Rinse, BID, KGreta Doom MD, 15 mL at 11/02/17 0752 .  feeding supplement (PRO-STAT SUGAR FREE 64) liquid 60 mL, 60 mL, Per Tube, 5 X Daily, Sood, Vineet, MD, 60 mL at 11/02/17 1011 .  feeding supplement (VITAL HIGH PROTEIN) liquid 1,000 mL, 1,000 mL, Per Tube, Q24H, Sood, Vineet, MD, 1,000 mL at 11/01/17 1349 .  fentaNYL (SUBLIMAZE) injection 100 mcg, 100 mcg, Intravenous, Q2H PRN, SChesley Mires MD, 100 mcg at 11/01/17 1855 .  furosemide (LASIX) injection 40 mg, 40 mg, Intravenous, Q12H, Sood, Vineet, MD, 40 mg at 11/02/17 0057 .  heparin injection 5,000 Units, 5,000 Units, Subcutaneous, Q8H, Masters, AJake Church RPH, 5,000 Units at 11/02/17 0530 .  hydrALAZINE (APRESOLINE) injection 5 mg, 5 mg, Intravenous, Q6H PRN, Blount, Xenia T, NP, 5 mg at 10/28/17 0341 .  hydrALAZINE (APRESOLINE) tablet 25 mg, 25 mg, Oral, Q6H, LJanora Norlander MD, 25 mg at 11/02/17 09811.  insulin aspart (novoLOG) injection 0-20 Units, 0-20 Units, Subcutaneous, Q4H, Etheleen Nicks, MD, 7 Units at 11/02/17 0930 .  insulin detemir (LEVEMIR) injection 14 Units, 14 Units, Subcutaneous, Daily, Sood, Vineet, MD .  ipratropium-albuterol (DUONEB) 0.5-2.5 (3) MG/3ML nebulizer solution 3 mL, 3 mL, Nebulization, Q6H, Sood, Vineet, MD, 3 mL at 11/02/17 0802 .  lacosamide (VIMPAT) 200 mg in sodium chloride 0.9 % 25 mL IVPB, 200 mg, Intravenous, Q12H, Greta Doom, MD, Last Rate: 90 mL/hr at  11/02/17 1025, 200 mg at 11/02/17 1025 .  levETIRAcetam (KEPPRA) IVPB 1000 mg/100 mL premix, 1,000 mg, Intravenous, Q12H, Marliss Coots, PA-C, Last Rate: 400 mL/hr at 11/02/17 1007, 1,000 mg at 11/02/17 1007 .  levothyroxine (SYNTHROID, LEVOTHROID) tablet 50 mcg, 50 mcg, Oral, QAC breakfast, Janora Norlander, MD, 50 mcg at 11/02/17 0630 .  MEDLINE mouth rinse, 15 mL, Mouth Rinse, 10 times per day, Greta Doom, MD, 15 mL at 11/02/17 1000 .  metoprolol tartrate (LOPRESSOR) 25 mg/10 mL oral suspension 25 mg, 25 mg, Per Tube, BID, Chesley Mires, MD, 25 mg at 11/02/17 1011 .  midazolam (VERSED) injection 2 mg, 2 mg, Intravenous, Q2H PRN, Sood, Vineet, MD .  montelukast (SINGULAIR) tablet 10 mg, 10 mg, Per Tube, QHS, Chesley Mires, MD, 10 mg at 11/01/17 2249 .  pantoprazole sodium (PROTONIX) 40 mg/20 mL oral suspension 40 mg, 40 mg, Per Tube, Q24H, Sood, Vineet, MD, 40 mg at 11/02/17 1011 .  phenytoin (DILANTIN) 135 mg in sodium chloride 0.9 % 100 mL IVPB, 135 mg, Intravenous, Q8H, Millen, Jessica B, RPH, Last Rate: 205.4 mL/hr at 11/02/17 0530, 135 mg at 11/02/17 0530 .  propofol (DIPRIVAN) 1000 MG/100ML infusion, 0-50 mcg/kg/min, Intravenous, Continuous, Sood, Vineet, MD, Last Rate: 25 mL/hr at 11/02/17 0755, 30 mcg/kg/min at 11/02/17 0755 Labs CBC    Component Value Date/Time   WBC 8.7 11/02/2017 0500   RBC 4.30 11/02/2017 0500   HGB 12.0 (L) 11/02/2017 0500   HCT 40.4 11/02/2017 0500   PLT 274 11/02/2017 0500   MCV 94.0 11/02/2017 0500   MCH 27.9 11/02/2017 0500   MCHC 29.7 (L) 11/02/2017 0500   RDW 14.7 11/02/2017 0500   LYMPHSABS 2.0 10/29/2017 0409   MONOABS 1.2 (H) 10/29/2017 0409   EOSABS 0.4 10/29/2017 0409   BASOSABS 0.0 10/29/2017 0409    CMP     Component Value Date/Time   NA 146 (H) 11/02/2017 0500   K 4.2 11/02/2017 0500   CL 114 (H) 11/02/2017 0500   CO2 23 11/02/2017 0500   GLUCOSE 241 (H) 11/02/2017 0500   BUN 66 (H) 11/02/2017 0500   CREATININE 2.00 (H)  11/02/2017 0500   CALCIUM 8.3 (L) 11/02/2017 0500   PROT 5.4 (L) 10/31/2017 0547   ALBUMIN 2.1 (L) 10/31/2017 0547   ALBUMIN 2.7 (L) 10/28/2017 1406   AST 17 10/31/2017 0547   ALT 18 10/31/2017 0547   ALKPHOS 61 10/31/2017 0547   BILITOT 0.5 10/31/2017 0547   GFRNONAA 36 (L) 11/02/2017 0500   GFRAA 42 (L) 11/02/2017 0500    Imaging I have reviewed images in epic and the results pertinent to this consultation are: MRI examination of the brain-at outside hospital.  Reviewed in PACS.  From 10/25/2017 Negative for acute infarct.  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  The lack of control with 3 agents led to him being put into burst suppression with Versed and propofol.  Versed discontinued since 10/30/2017 and EEG showed no further seizures.  EEG was then discontinued. Patient continues to be very obtunded and on high sedation due to respiratory reasons.  Impression: Hyperglycemia Seizure  Recommendations: Continue current dose of antiepileptics-Keppra, Vimpat and Dilantin. Low-dose benzos if breakthrough seizures occur. Management of metabolic and possible infectious derangements per primary team as you are. Neurology will be available as needed  Discussed the plan in person with patient's family-wife at bedside and Dr. Halford Chessman from PCCM   -- Amie Portland, MD [facility] Pager: [phone] If 7pm to 7am, please call on call as listed on AMION.  CRITICAL CARE ATTESTATION This patient is critically ill and at significant risk of neurological worsening, death and care requires constant monitoring of vital signs, hemodynamics, respiratory, and cardiac monitoring. I spent 25  minutes of neurocritical care time performing  neurological assessment, discussion with family, other specialists and medical decision making of high complexity in the care of  this patient.

## 2017-11-02 NOTE — Progress Notes (Signed)
Pt placed back on full vent support due to increased WOB and RR.

## 2017-11-03 ENCOUNTER — Inpatient Hospital Stay (HOSPITAL_COMMUNITY): Payer: Medicaid Other

## 2017-11-03 LAB — TRIGLYCERIDES: Triglycerides: 447 mg/dL — ABNORMAL HIGH (ref <150)

## 2017-11-03 LAB — GLUCOSE, CAPILLARY
GLUCOSE-CAPILLARY: 142 mg/dL — AB (ref 70–99)
GLUCOSE-CAPILLARY: 144 mg/dL — AB (ref 70–99)
GLUCOSE-CAPILLARY: 147 mg/dL — AB (ref 70–99)
GLUCOSE-CAPILLARY: 147 mg/dL — AB (ref 70–99)
GLUCOSE-CAPILLARY: 148 mg/dL — AB (ref 70–99)
GLUCOSE-CAPILLARY: 150 mg/dL — AB (ref 70–99)
GLUCOSE-CAPILLARY: 152 mg/dL — AB (ref 70–99)
GLUCOSE-CAPILLARY: 173 mg/dL — AB (ref 70–99)
GLUCOSE-CAPILLARY: 291 mg/dL — AB (ref 70–99)
GLUCOSE-CAPILLARY: 300 mg/dL — AB (ref 70–99)
Glucose-Capillary: 130 mg/dL — ABNORMAL HIGH (ref 70–99)
Glucose-Capillary: 139 mg/dL — ABNORMAL HIGH (ref 70–99)
Glucose-Capillary: 152 mg/dL — ABNORMAL HIGH (ref 70–99)
Glucose-Capillary: 160 mg/dL — ABNORMAL HIGH (ref 70–99)
Glucose-Capillary: 180 mg/dL — ABNORMAL HIGH (ref 70–99)
Glucose-Capillary: 193 mg/dL — ABNORMAL HIGH (ref 70–99)
Glucose-Capillary: 218 mg/dL — ABNORMAL HIGH (ref 70–99)
Glucose-Capillary: 226 mg/dL — ABNORMAL HIGH (ref 70–99)
Glucose-Capillary: 229 mg/dL — ABNORMAL HIGH (ref 70–99)
Glucose-Capillary: 269 mg/dL — ABNORMAL HIGH (ref 70–99)
Glucose-Capillary: 278 mg/dL — ABNORMAL HIGH (ref 70–99)
Glucose-Capillary: 330 mg/dL — ABNORMAL HIGH (ref 70–99)

## 2017-11-03 LAB — BASIC METABOLIC PANEL
Anion gap: 11 (ref 5–15)
BUN: 55 mg/dL — AB (ref 6–20)
CO2: 24 mmol/L (ref 22–32)
CREATININE: 1.57 mg/dL — AB (ref 0.61–1.24)
Calcium: 8.9 mg/dL (ref 8.9–10.3)
Chloride: 114 mmol/L — ABNORMAL HIGH (ref 98–111)
GFR calc Af Amer: 56 mL/min — ABNORMAL LOW (ref >60)
GFR calc non Af Amer: 49 mL/min — ABNORMAL LOW (ref >60)
Glucose, Bld: 242 mg/dL — ABNORMAL HIGH (ref 70–99)
Potassium: 3.1 mmol/L — ABNORMAL LOW (ref 3.5–5.1)
SODIUM: 149 mmol/L — AB (ref 135–145)

## 2017-11-03 LAB — CBC
HCT: 44.1 % (ref 39.0–52.0)
Hemoglobin: 13.3 g/dL (ref 13.0–17.0)
MCH: 27.7 pg (ref 26.0–34.0)
MCHC: 30.2 g/dL (ref 30.0–36.0)
MCV: 91.9 fL (ref 78.0–100.0)
PLATELETS: 327 10*3/uL (ref 150–400)
RBC: 4.8 MIL/uL (ref 4.22–5.81)
RDW: 14.3 % (ref 11.5–15.5)
WBC: 9.3 10*3/uL (ref 4.0–10.5)

## 2017-11-03 MED ORDER — SODIUM CHLORIDE 0.9 % IV SOLN
INTRAVENOUS | Status: DC | PRN
  Administered 2017-11-03: 250 mL via INTRAVENOUS
  Administered 2017-11-06: 1000 mL via INTRAVENOUS

## 2017-11-03 MED ORDER — CHLORHEXIDINE GLUCONATE 0.12 % MT SOLN
15.0000 mL | Freq: Two times a day (BID) | OROMUCOSAL | Status: DC
  Administered 2017-11-03 – 2017-11-21 (×34): 15 mL via OROMUCOSAL
  Filled 2017-11-03 (×32): qty 15

## 2017-11-03 MED ORDER — ACETAMINOPHEN 325 MG PO TABS
650.0000 mg | ORAL_TABLET | Freq: Four times a day (QID) | ORAL | Status: DC | PRN
  Administered 2017-11-05 – 2017-11-21 (×7): 650 mg via ORAL
  Filled 2017-11-03 (×8): qty 2

## 2017-11-03 MED ORDER — LABETALOL HCL 5 MG/ML IV SOLN
10.0000 mg | INTRAVENOUS | Status: DC | PRN
  Administered 2017-11-03 – 2017-11-04 (×2): 10 mg via INTRAVENOUS
  Filled 2017-11-03 (×3): qty 4

## 2017-11-03 MED ORDER — FUROSEMIDE 10 MG/ML IJ SOLN
40.0000 mg | Freq: Every day | INTRAMUSCULAR | Status: DC
  Administered 2017-11-03 – 2017-11-05 (×3): 40 mg via INTRAVENOUS
  Filled 2017-11-03 (×3): qty 4

## 2017-11-03 MED ORDER — METOPROLOL TARTRATE 25 MG/10 ML ORAL SUSPENSION: 25.0000 mg | Freq: Two times a day (BID) | ORAL | Status: DC

## 2017-11-03 MED ORDER — ORAL CARE MOUTH RINSE
15.0000 mL | Freq: Two times a day (BID) | OROMUCOSAL | Status: DC
  Administered 2017-11-03 – 2017-11-22 (×23): 15 mL via OROMUCOSAL

## 2017-11-03 MED ORDER — ACETAMINOPHEN 650 MG RE SUPP
650.0000 mg | Freq: Four times a day (QID) | RECTAL | Status: DC | PRN
  Administered 2017-11-03 – 2017-11-07 (×3): 650 mg via RECTAL
  Filled 2017-11-03 (×3): qty 1

## 2017-11-03 MED ORDER — ASPIRIN 81 MG PO CHEW
81.0000 mg | CHEWABLE_TABLET | Freq: Every day | ORAL | Status: DC
  Administered 2017-11-05 – 2017-11-22 (×17): 81 mg via ORAL
  Filled 2017-11-03 (×17): qty 1

## 2017-11-03 MED ORDER — MONTELUKAST SODIUM 10 MG PO TABS
10.0000 mg | ORAL_TABLET | Freq: Every day | ORAL | Status: DC
  Filled 2017-11-03: qty 1

## 2017-11-03 MED ORDER — CHLORHEXIDINE GLUCONATE CLOTH 2 % EX PADS
6.0000 | MEDICATED_PAD | Freq: Every day | CUTANEOUS | Status: DC
  Administered 2017-11-04: 6 via TOPICAL

## 2017-11-03 MED ORDER — METOPROLOL TARTRATE 5 MG/5ML IV SOLN
5.0000 mg | Freq: Four times a day (QID) | INTRAVENOUS | Status: DC
  Administered 2017-11-03 – 2017-11-08 (×18): 5 mg via INTRAVENOUS
  Filled 2017-11-03 (×18): qty 5

## 2017-11-03 MED ORDER — HYDRALAZINE HCL 20 MG/ML IJ SOLN
10.0000 mg | INTRAMUSCULAR | Status: DC | PRN
  Administered 2017-11-03 – 2017-11-15 (×7): 10 mg via INTRAVENOUS
  Filled 2017-11-03 (×7): qty 1

## 2017-11-03 MED ORDER — POTASSIUM CHLORIDE 10 MEQ/50ML IV SOLN
10.0000 meq | INTRAVENOUS | Status: AC
  Administered 2017-11-03 (×4): 10 meq via INTRAVENOUS
  Filled 2017-11-03 (×4): qty 50

## 2017-11-03 NOTE — Evaluation (Signed)
Clinical/Bedside Swallow Evaluation Patient Details  Name: Daniel [name] MRN: [id number] Date of Birth: 1965/12/25  Today's Date: 11/03/2017 Time: SLP Start Time (ACUTE ONLY): 79 SLP Stop Time (ACUTE ONLY): 1238 SLP Time Calculation (min) (ACUTE ONLY): 8 min  Past Medical History:  Past Medical History:  Diagnosis Date  . Pneumonia   . Pulmonary embolism ([place])   . Sleep apnea    Past Surgical History: No past surgical history on file. HPI:  52 yo male presented to [facility] with altered mental status, confusion, headaches, hallucinations, and falls.  SBP was over 200.  In ER at [facility] he was started on esmolol gtt.  Developed seizure in [place] at [place].  He continued to have seizures and transferred to [facility].  He was started on burst suppression with versed and required intubation 9/07.  Developed fever and increased sputum 9/10. Extubated 9/13.    Assessment / Plan / Recommendation Clinical Impression  Pt does not yet demonstrate adequate awareness, strength, oral movement or airway protection capacity to consider POs. Laryngeal edema following extubation expected given poor ability to phonate and wet upper airway sounds. Pt does not orally manipulate ice and tongue appears swollen and stiff. Recommend pt remain NPO until clinical improvmenet observed for further trials or objective study when warranted. Discussed with RN.  SLP Visit Diagnosis: Dysphagia, oropharyngeal phase (R13.12)    Aspiration Risk  Severe aspiration risk    Diet Recommendation NPO;Alternative means - temporary        Other  Recommendations     Follow up Recommendations Other (comment)      Frequency and Duration min 2x/week  2 weeks       Prognosis Prognosis for Safe Diet Advancement: Fair Barriers to Reach Goals: Severity of deficits      Swallow Study   General HPI: 52 yo male presented to [place] with altered mental status, confusion, headaches, hallucinations, and falls.  SBP  was over 200.  In ER at [facility] he was started on esmolol gtt.  Developed seizure in [place] at [place].  He continued to have seizures and transferred to [facility].  He was started on burst suppression with versed and required intubation 9/07.  Developed fever and increased sputum 9/10. Extubated 9/13.  Type of Study: Bedside Swallow Evaluation Previous Swallow Assessment: none Diet Prior to this Study: NPO Temperature Spikes Noted: No Respiratory Status: Nasal cannula History of Recent Intubation: Yes Length of Intubations (days): 7 days Date extubated: 11/03/17 Behavior/Cognition: Alert;Doesn't follow directions;Confused Oral Cavity Assessment: Within Functional Limits Oral Care Completed by SLP: Recent completion by staff Oral Cavity - Dentition: Adequate natural dentition Vision: (double vision) Self-Feeding Abilities: Total assist Patient Positioning: Upright in bed Baseline Vocal Quality: Hoarse;Wet Volitional Cough: Congested Volitional Swallow: Unable to elicit    Oral/Motor/Sensory Function Overall Oral Motor/Sensory Function: Severe impairment(lingual edema, no movement)   Ice Chips Ice chips: Impaired Presentation: (gloved fingers of SLP) Oral Phase Impairments: Reduced labial seal;Poor awareness of bolus;Reduced lingual movement/coordination   Thin Liquid      Nectar Thick     Honey Thick     Puree     Solid           Herbie Baltimore, MA CCC-SLP [phone]   Lynann Beaver 11/03/2017,2:41 PM

## 2017-11-03 NOTE — Progress Notes (Signed)
PULMONARY / CRITICAL CARE MEDICINE   NAME:  Daniel [name], MRN:  [id number], DOB:  03-21-1965, LOS: 7 ADMISSION DATE:  10/27/2017, CONSULTATION DATE:  10/28/2017 REFERRING MD:  Dr. Leonel Ramsay, Neurology CHIEF COMPLAINT:  Altered mental status  BRIEF HISTORY:    52 yo male presented to [facility] with altered mental status, confusion, headaches, hallucinations, and falls.  SBP was over 200.  In ER at [facility] he was started on esmolol gtt.  Developed seizure in [place] at [place].  He continued to have seizures and transferred to [facility].  He was started on burst suppression with versed and required intubation 9/07.  Developed fever and increased sputum 9/10.  SIGNIFICANT PAST MEDICAL HISTORY   IDDM, HTN, OSA, OHS, Seizures, PE  SIGNIFICANT EVENTS:  9/07 Admit 9/13 Extubate  STUDIES:   EEG 9/07 >> focal non convulsive status epilepticus from Rt occipital region LP 9/08 >> RBC 99, WBC 4, glucose 104, protein 81  CULTURES:  CSF 9/07 >> negative Blood 9/10 >> Sputum 9/10 >> Haemophilus influenzae C diff PCR 9/12 >> negative  ANTIBIOTICS:  Vancomycin 9/10 >> 9/12 Zosyn 9/10 >> 9/12 Rocephin 9/12 >>   LINES/TUBES:  ETT 9/07 >> Lt IJ CVL 9/07 >>   CONSULTANTS:  Neurology 9/07 >>  SUBJECTIVE:  Tolerating pressure support.  CONSTITUTIONAL: BP (!) 148/89 (BP Location: Right Arm)   Pulse 92   Temp 98.6 F (37 C) (Esophageal)   Resp (!) 22   Ht 5\' 9"  (1.753 m)   Wt 135.1 kg   SpO2 96%   BMI 43.98 kg/m   I/O last 3 completed shifts: In: 4613.8 [I.V.:1062; NG/GT:720; IV Piggyback:2831.7] Out: 9825 [Urine:9825]     Vent Mode: PSV;CPAP FiO2 (%):  [40 %] 40 % Set Rate:  [16 bmp] 16 bmp Vt Set:  [570 mL] 570 mL PEEP:  [5 cmH20] 5 cmH20 Pressure Support:  [5 cmH20-8 cmH20] 5 cmH20 Plateau Pressure:  [16 cmH20-19 cmH20] 16 cmH20  PHYSICAL EXAM:  General - more alert Eyes - pupils reactive ENT - ETT in place Cardiac - regular rate/rhythm, no murmur Chest - equal breath  sounds b/l, no wheezing or rales Abdomen - soft, non tender, + bowel sounds Extremities - 1+ edema Skin - no rashes Neuro - moving extremities, more interactive  CXR 9/13 (reviewed by me) >> atelectasis at bases  ASSESSMENT AND PLAN    Status epilepticus felt to be from hyperglycemic hemianopia. - continue vimpat, keppra, dilantin - PT/OT/Speech after extubation  Acute hypoxic respiratory failure with compromised airway. OSA, OHS, obstructive lung disease. - extubate 9/13 - oxygen to keep SpO2 > 92% - Bipap qhs and prn - continue pulmicort, singulair, duoneb  CAP from Haemophilus influenzae. - day 4/7 of ABx, currently on rocephin  Diarrhea. - C diff PCR negative - monitor  DM type II. - hyperglycemia 2 protocol  CAD,HLD. HTN emergency with chronic diastolic CHF. - continue ASA, hydralazine, lopressor - hold lisinopril with elevated creatinine - lasix 40 mg IV daily  CKD 2. - foley clogged with sediment 9/09 - baseline creatinine 1.35 - irrigate foley - monitor urine outpt - keep foley in for now  Hypothyroidism. - continue synthroid  SUMMARY OF TODAY'S PLAN:  Extubated 9/13.  Best Practice / Goals of Care / Disposition.   DVT PROPHYLAXIS: SQ heparin SUP: Protonix NUTRITION: NPO MOBILITY: advance activity as able GOALS OF CARE: full code FAMILY DISCUSSIONS:  Updated family at bedside   LABS  Glucose Recent Labs  Lab 11/03/17 0305  11/03/17 0400 11/03/17 0514 11/03/17 0616 11/03/17 0759 11/03/17 0859  GLUCAP 269* 278* 218* 229* 226* 193*    BMET Recent Labs  Lab 11/01/17 0503 11/02/17 0500 11/03/17 0500  NA 142 146* 149*  K 3.8 4.2 3.1*  CL 111 114* 114*  CO2 21* 23 24  BUN 55* 66* 55*  CREATININE 2.14* 2.00* 1.57*  GLUCOSE 234* 241* 242*    Liver Enzymes Recent Labs  Lab 10/29/17 0409 10/30/17 0530 10/31/17 0547  AST 18 15 17   ALT 21 20 18   ALKPHOS 57 54 61  BILITOT 0.6 0.8 0.5  ALBUMIN 2.3* 2.2* 2.1*     Electrolytes Recent Labs  Lab 10/31/17 1812 11/01/17 0503 11/01/17 1700 11/02/17 0500 11/03/17 0500  CALCIUM  --  7.8*  --  8.3* 8.9  MG 2.0 2.1 2.0  --   --   PHOS 4.2 4.8* 4.6  --   --     CBC Recent Labs  Lab 11/01/17 0503 11/02/17 0500 11/03/17 0500  WBC 8.8 8.7 9.3  HGB 11.0* 12.0* 13.3  HCT 37.1* 40.4 44.1  PLT 261 274 327    ABG Recent Labs  Lab 10/28/17 1558 10/29/17 0413 10/30/17 0502  PHART 7.291* 7.208* 7.328*  PCO2ART 53.7* 56.3* 37.3  PO2ART 175.0* 163.0* 127.0*    Coag's Recent Labs  Lab 10/29/17 0409  APTT 39*  INR 1.06    Sepsis Markers Recent Labs  Lab 10/28/17 1311 10/28/17 1656 10/31/17 0905 11/01/17 0503 11/02/17 0500  LATICACIDVEN 0.8 1.3  --   --   --   PROCALCITON  --   --  0.34 0.54 0.49    Cardiac Enzymes No results for input(s): TROPONINI, PROBNP in the last 168 hours.  CC time 33 minutes  Chesley Mires, MD [facility] Pulmonary/Critical Care 11/03/2017, 9:38 AM

## 2017-11-03 NOTE — Progress Notes (Signed)
Spoke with pt's nurse, we decided that for pt safety, pt would not be placed on BIPAP overnight. Pt appears comfortable at this time on 4L [place]. RT will continue to monitor.

## 2017-11-03 NOTE — Progress Notes (Signed)
Pt with congested, weaker cough. RT encouraged pt to cough, and was able to suction small amount of thick, white secretions from back of throat. RT will continue to monitor.

## 2017-11-03 NOTE — Progress Notes (Signed)
Inpatient Diabetes Program Recommendations  AACE/ADA: New Consensus Statement on Inpatient Glycemic Control (2015)  Target Ranges:  Prepandial:   less than 140 mg/dL      Peak postprandial:   less than 180 mg/dL (1-2 hours)      Critically ill patients:  140 - 180 mg/dL   Lab Results  Component Value Date   GLUCAP 180 (H) 11/03/2017   HGBA1C 15.8 (H) 10/28/2017    Review of Glycemic ControlResults for TENNIS, MCKINNON (MRN [id number]) as of 11/03/2017 11:20  Ref. Range 11/03/2017 05:14 11/03/2017 06:16 11/03/2017 07:59 11/03/2017 08:59 11/03/2017 10:00  Glucose-Capillary Latest Ref Range: 70 - 99 mg/dL 218 (H) 229 (H) 226 (H) 193 (H) 180 (H)    Diabetes history: Type 2 DM  Outpatient Diabetes medications:  Levemir 120 units daily, Metformin 500 mg bid Current orders for Inpatient glycemic control:  IV insulin-  Inpatient Diabetes Program Recommendations:   Note patient extubated today.  May consider re-trying transition off insulin drip.  Patient was on high dose insulin at home prior to admit.   -Consider adding Levemir 20 units bid and stopping insulin drip 1-2 hours after SQ insulin given.    Thanks,  Adah Perl, RN, BC-ADM Inpatient Diabetes Coordinator Pager [phone] (8a-5p)

## 2017-11-03 NOTE — Procedures (Signed)
Extubation Procedure Note  Patient Details:   Name: Daniel [name] DOB: Oct 10, 1965 MRN: [id number]   Airway Documentation:    Vent end date: 11/03/17 Vent end time: 0943   Evaluation  O2 sats: stable throughout Complications: No apparent complications Patient did tolerate procedure well. Bilateral Breath Sounds: Rhonchi, Diminished   No   Positive cuff  Leak noted.  Pt placed on [place] 4 L with humidity, no stridor noted.    Bayard Beaver 11/03/2017, 9:49 AM

## 2017-11-04 LAB — CBC
HCT: 46.1 % (ref 39.0–52.0)
HEMOGLOBIN: 13.9 g/dL (ref 13.0–17.0)
MCH: 27.8 pg (ref 26.0–34.0)
MCHC: 30.2 g/dL (ref 30.0–36.0)
MCV: 92.2 fL (ref 78.0–100.0)
Platelets: 345 10*3/uL (ref 150–400)
RBC: 5 MIL/uL (ref 4.22–5.81)
RDW: 14.4 % (ref 11.5–15.5)
WBC: 9.8 10*3/uL (ref 4.0–10.5)

## 2017-11-04 LAB — GLUCOSE, CAPILLARY
GLUCOSE-CAPILLARY: 125 mg/dL — AB (ref 70–99)
GLUCOSE-CAPILLARY: 147 mg/dL — AB (ref 70–99)
GLUCOSE-CAPILLARY: 156 mg/dL — AB (ref 70–99)
GLUCOSE-CAPILLARY: 157 mg/dL — AB (ref 70–99)
GLUCOSE-CAPILLARY: 194 mg/dL — AB (ref 70–99)
GLUCOSE-CAPILLARY: 210 mg/dL — AB (ref 70–99)
Glucose-Capillary: 163 mg/dL — ABNORMAL HIGH (ref 70–99)
Glucose-Capillary: 179 mg/dL — ABNORMAL HIGH (ref 70–99)
Glucose-Capillary: 165 mg/dL — ABNORMAL HIGH (ref 70–99)
Glucose-Capillary: 148 mg/dL — ABNORMAL HIGH (ref 70–99)
Glucose-Capillary: 141 mg/dL — ABNORMAL HIGH (ref 70–99)
Glucose-Capillary: 155 mg/dL — ABNORMAL HIGH (ref 70–99)
Glucose-Capillary: 154 mg/dL — ABNORMAL HIGH (ref 70–99)
Glucose-Capillary: 142 mg/dL — ABNORMAL HIGH (ref 70–99)
Glucose-Capillary: 178 mg/dL — ABNORMAL HIGH (ref 70–99)

## 2017-11-04 LAB — BASIC METABOLIC PANEL
ANION GAP: 9 (ref 5–15)
BUN: 46 mg/dL — AB (ref 6–20)
CALCIUM: 9.2 mg/dL (ref 8.9–10.3)
CO2: 27 mmol/L (ref 22–32)
CREATININE: 1.28 mg/dL — AB (ref 0.61–1.24)
Chloride: 117 mmol/L — ABNORMAL HIGH (ref 98–111)
GFR calc Af Amer: >60 mL/min (ref >60)
GFR calc non Af Amer: >60 mL/min (ref >60)
GLUCOSE: 181 mg/dL — AB (ref 70–99)
Potassium: 3 mmol/L — ABNORMAL LOW (ref 3.5–5.1)
Sodium: 153 mmol/L — ABNORMAL HIGH (ref 135–145)

## 2017-11-04 LAB — MAGNESIUM: MAGNESIUM: 2.2 mg/dL (ref 1.7–2.4)

## 2017-11-04 MED ORDER — POTASSIUM CHLORIDE 10 MEQ/50ML IV SOLN
10.0000 meq | INTRAVENOUS | Status: AC
  Administered 2017-11-04 (×4): 10 meq via INTRAVENOUS
  Filled 2017-11-04 (×4): qty 50

## 2017-11-04 MED ORDER — DEXTROSE 5 % IV SOLN
INTRAVENOUS | Status: DC
  Administered 2017-11-04 – 2017-11-05 (×2): via INTRAVENOUS

## 2017-11-04 MED ORDER — INSULIN ASPART 100 UNIT/ML ~~LOC~~ SOLN
0.0000 [IU] | SUBCUTANEOUS | Status: DC
  Administered 2017-11-04: 7 [IU] via SUBCUTANEOUS
  Administered 2017-11-04 – 2017-11-05 (×4): 4 [IU] via SUBCUTANEOUS
  Administered 2017-11-05 (×2): 7 [IU] via SUBCUTANEOUS
  Administered 2017-11-05: 4 [IU] via SUBCUTANEOUS
  Administered 2017-11-06: 7 [IU] via SUBCUTANEOUS
  Administered 2017-11-06: 4 [IU] via SUBCUTANEOUS
  Administered 2017-11-06: 11 [IU] via SUBCUTANEOUS
  Administered 2017-11-06 (×2): 4 [IU] via SUBCUTANEOUS
  Administered 2017-11-06: 7 [IU] via SUBCUTANEOUS
  Administered 2017-11-07 (×6): 4 [IU] via SUBCUTANEOUS
  Administered 2017-11-08: 11 [IU] via SUBCUTANEOUS
  Administered 2017-11-08 (×2): 7 [IU] via SUBCUTANEOUS
  Administered 2017-11-08: 3 [IU] via SUBCUTANEOUS
  Administered 2017-11-08: 4 [IU] via SUBCUTANEOUS
  Administered 2017-11-08 (×2): 7 [IU] via SUBCUTANEOUS
  Administered 2017-11-09 (×4): 4 [IU] via SUBCUTANEOUS
  Administered 2017-11-09: 7 [IU] via SUBCUTANEOUS
  Administered 2017-11-10: 3 [IU] via SUBCUTANEOUS
  Administered 2017-11-10: 4 [IU] via SUBCUTANEOUS
  Administered 2017-11-10 (×2): 3 [IU] via SUBCUTANEOUS
  Administered 2017-11-10: 4 [IU] via SUBCUTANEOUS
  Administered 2017-11-10 – 2017-11-11 (×2): 3 [IU] via SUBCUTANEOUS
  Administered 2017-11-11 (×2): 4 [IU] via SUBCUTANEOUS
  Administered 2017-11-11: 3 [IU] via SUBCUTANEOUS
  Administered 2017-11-11 (×2): 4 [IU] via SUBCUTANEOUS
  Administered 2017-11-12 (×3): 3 [IU] via SUBCUTANEOUS
  Administered 2017-11-12: 4 [IU] via SUBCUTANEOUS
  Administered 2017-11-12 – 2017-11-13 (×4): 3 [IU] via SUBCUTANEOUS
  Administered 2017-11-13 (×2): 4 [IU] via SUBCUTANEOUS
  Administered 2017-11-14: 7 [IU] via SUBCUTANEOUS
  Administered 2017-11-14: 3 [IU] via SUBCUTANEOUS
  Administered 2017-11-14 (×3): 4 [IU] via SUBCUTANEOUS
  Administered 2017-11-15 (×2): 3 [IU] via SUBCUTANEOUS
  Administered 2017-11-15 (×2): 4 [IU] via SUBCUTANEOUS
  Administered 2017-11-15: 7 [IU] via SUBCUTANEOUS

## 2017-11-04 NOTE — Progress Notes (Signed)
PULMONARY / CRITICAL CARE MEDICINE   NAME:  Daniel [name], MRN:  [id number], DOB:  Sep 13, 1965, LOS: 8 ADMISSION DATE:  10/27/2017, CONSULTATION DATE:  10/28/2017 REFERRING MD:  Dr. Leonel Ramsay, Neurology CHIEF COMPLAINT:  Altered mental status  BRIEF HISTORY:    52 yo male presented to [facility] with altered mental status, confusion, headaches, hallucinations, and falls.  SBP was over 200.  In ER at [facility] he was started on esmolol gtt.  Developed seizure in [place] at [place].  He continued to have seizures and transferred to [facility].  He was started on burst suppression with versed and required intubation 9/07.  Developed fever and increased sputum 9/10.  SIGNIFICANT PAST MEDICAL HISTORY   IDDM, HTN, OSA, OHS, Seizures, PE  SIGNIFICANT EVENTS:  9/07 Admit 9/13 Extubate  STUDIES:   EEG 9/07 >> focal non convulsive status epilepticus from Rt occipital region LP 9/08 >> RBC 99, WBC 4, glucose 104, protein 81  CULTURES:  CSF 9/07 >> negative Blood 9/10 >> Sputum 9/10 >> Haemophilus influenzae C diff PCR 9/12 >> negative  ANTIBIOTICS:  Vancomycin 9/10 >> 9/12 Zosyn 9/10 >> 9/12 Rocephin 9/12 >>   LINES/TUBES:  ETT 9/07 >> Lt IJ CVL 9/07 >>   CONSULTANTS:  Neurology 9/07 >>  SUBJECTIVE:  9/14 - deemed severe aspiration risk poist extubation. Weak cough and voice though improving rapidly. Family says he uses CPAP at night. He is diffusely weak. Insluing gtt due to ICU  hypergluycemia protocol ongoing. No NG tube  CONSTITUTIONAL: BP (!) 155/95   Pulse 98   Temp 98.7 F (37.1 C) (Oral)   Resp 19   Ht 5\' 9"  (1.753 m)   Wt 134.3 kg   SpO2 96%   BMI 43.72 kg/m   I/O last 3 completed shifts: In: 2490.9 [I.V.:811.2; NG/GT:440; IV Piggyback:1239.6] Out: 6265 [Urine:6265]        PHYSICAL EXAM: General Appearance: OBESE - + . deconditined looking, diffusely weak Head:  Normocephalic, without obvious abnormality, atraumatic Eyes:  PERRL - yes, conjunctiva/corneas - clear      Ears:  Normal external ear canals, both ears Nose:  G tube - no Throat:  ETT TUBE - no , OG tube - no Neck:  Supple,  No enlargement/tenderness/nodules Lungs: Clear to auscultation bilaterally,  Heart:  S1 and S2 normal, no murmur, CVP - no.  Pressors - no Abdomen:  Soft, no masses, no organomegaly Genitalia / Rectal:  Not done Extremities:  Extremities- edema + Skin:  ntact in exposed areas . Neurologic:  Sedation - none -> RASS - +1 . Moves all 4s - yes but very weak. CAM-ICU - unable to assess . Orientation - follows some commands. Vocie weak. Cough weak      LABS    PULMONARY Recent Labs  Lab 10/28/17 1558 10/29/17 0413 10/30/17 0502  PHART 7.291* 7.208* 7.328*  PCO2ART 53.7* 56.3* 37.3  PO2ART 175.0* 163.0* 127.0*  HCO3 25.8 22.3 19.3*  TCO2 27 24 20*  O2SAT 99.0 99.0 99.0    CBC Recent Labs  Lab 11/02/17 0500 11/03/17 0500 11/04/17 0448  HGB 12.0* 13.3 13.9  HCT 40.4 44.1 46.1  WBC 8.7 9.3 9.8  PLT 274 327 345    COAGULATION Recent Labs  Lab 10/29/17 0409  INR 1.06    CARDIAC  No results for input(s): TROPONINI in the last 168 hours. No results for input(s): PROBNP in the last 168 hours.   CHEMISTRY Recent Labs  Lab 10/30/17 1714 10/31/17 0547 10/31/17 3382  10/31/17 1812 11/01/17 0503 11/01/17 1700 11/02/17 0500 11/03/17 0500 11/04/17 0448  NA  --  140  --   --  142  --  146* 149* 153*  K  --  3.8  --   --  3.8  --  4.2 3.1* 3.0*  CL  --  110  --   --  111  --  114* 114* 117*  CO2  --  21*  --   --  21*  --  23 24 27   GLUCOSE  --  245*  --   --  234*  --  241* 242* 181*  BUN  --  46*  --   --  55*  --  66* 55* 46*  CREATININE  --  2.00*  --   --  2.14*  --  2.00* 1.57* 1.28*  CALCIUM  --  7.9*  --   --  7.8*  --  8.3* 8.9 9.2  MG 1.9  --  2.0 2.0 2.1 2.0  --   --  2.2  PHOS 3.1  --  4.0 4.2 4.8* 4.6  --   --   --    Estimated Creatinine Clearance: 90.7 mL/min (A) (by C-G formula based on SCr of 1.28 mg/dL  (H)).   LIVER Recent Labs  Lab 10/29/17 0409 10/30/17 0530 10/31/17 0547  AST 18 15 17   ALT 21 20 18   ALKPHOS 57 54 61  BILITOT 0.6 0.8 0.5  PROT 5.0* 5.0* 5.4*  ALBUMIN 2.3* 2.2* 2.1*  INR 1.06  --   --      INFECTIOUS Recent Labs  Lab 10/28/17 1656 10/31/17 0905 11/01/17 0503 11/02/17 0500  LATICACIDVEN 1.3  --   --   --   PROCALCITON  --  0.34 0.54 0.49     ENDOCRINE CBG (last 3)  Recent Labs    11/04/17 0752 11/04/17 0852 11/04/17 0955  GLUCAP 147* 148* 156*         IMAGING x48h  - image(s) personally visualized  -   highlighted in bold Dg Chest Port 1 View  Result Date: 11/03/2017 CLINICAL DATA:  Respiratory failure EXAM: PORTABLE CHEST 1 VIEW COMPARISON:  11/02/2017 FINDINGS: Heart is again enlarged but accentuated by the frontal technique. Endotracheal tube, nasogastric catheter and left jugular central line are again seen and stable. Elevation the right hemidiaphragm is again seen but stable. Mild left basilar atelectasis is noted. No effusion or pneumothorax is seen. IMPRESSION: Mild left basilar atelectasis. Electronically Signed   By: Inez Catalina M.D.   On: 11/03/2017 07:35     Recent Results (from the past 240 hour(s))  MRSA PCR Screening     Status: None   Collection Time: 10/28/17 10:55 AM  Result Value Ref Range Status   MRSA by PCR NEGATIVE NEGATIVE Final    Comment:        The GeneXpert MRSA Assay (FDA approved for NASAL specimens only), is one component of a comprehensive MRSA colonization surveillance program. It is not intended to diagnose MRSA infection nor to guide or monitor treatment for MRSA infections. Performed at [facility], [place] [address], [place], [place] [address]   CSF culture     Status: None   Collection Time: 10/28/17  1:46 PM  Result Value Ref Range Status   Specimen Description CSF  Final   Special Requests NONE  Final   Gram Stain   Final    WBC PRESENT, PREDOMINANTLY MONONUCLEAR NO ORGANISMS  SEEN CYTOSPIN SMEAR    Culture   Final    NO GROWTH 3 DAYS Performed at [facility], [place] [address] [place], [place] [address]    Report Status 10/31/2017 FINAL  Final  Culture, respiratory (non-expectorated)     Status: None   Collection Time: 10/31/17  9:01 AM  Result Value Ref Range Status   Specimen Description TRACHEAL ASPIRATE  Final   Special Requests NONE  Final   Gram Stain   Final    MODERATE WBC PRESENT, PREDOMINANTLY PMN FEW GRAM NEGATIVE RODS RARE GRAM POSITIVE RODS    Culture   Final    MODERATE HAEMOPHILUS INFLUENZAE BETA LACTAMASE NEGATIVE Performed at [facility], [address] [place], [place] [address]    Report Status 11/01/2017 FINAL  Final  Culture, blood (Routine X 2) w Reflex to ID Panel     Status: None (Preliminary result)   Collection Time: 10/31/17 11:04 AM  Result Value Ref Range Status   Specimen Description BLOOD BLOOD RIGHT HAND  Final   Special Requests   Final    BOTTLES DRAWN AEROBIC ONLY Blood Culture adequate volume   Culture   Final    NO GROWTH 4 DAYS Performed at [facility], [place] [address], [place], [place] [address]    Report Status PENDING  Incomplete  Culture, blood (Routine X 2) w Reflex to ID Panel     Status: None (Preliminary result)   Collection Time: 10/31/17 11:08 AM  Result Value Ref Range Status   Specimen Description BLOOD BLOOD RIGHT HAND  Final   Special Requests   Final    BOTTLES DRAWN AEROBIC ONLY Blood Culture results may not be optimal due to an inadequate volume of blood received in culture bottles   Culture   Final    NO GROWTH 4 DAYS Performed at [facility], [place] [address] [place], [place] [address]    Report Status PENDING  Incomplete  C difficile quick scan w PCR reflex     Status: None   Collection Time: 11/02/17  8:30 AM  Result Value Ref Range Status   C Diff antigen NEGATIVE NEGATIVE Final   C Diff toxin NEGATIVE NEGATIVE Final   C Diff interpretation No C.  difficile detected.  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 - continue vimpat, keppra, dilantin - PT/OT/Speech after extubation - in progress as of 11/04/2017 - primary management by neor  Acute hypoxic respiratory failure with compromised airway. OSA, OHS, obstructive lung disease. - extubate 9/13  - doing well 11/04/17 off vent but diffusely weak  PLAN - CPAP QHS (primary sleep doc Ddr Chodri) - oxygen to keep SpO2 > 92% -  continue pulmicort, duoneb - hold singulair in hospita;  CAP from Haemophilus influenzae. Anti-infectives (From admission, onward)   Start     Dose/Rate Route Frequency Ordered Stop   11/02/17 0900  cefTRIAXone (ROCEPHIN) 1 g in sodium chloride 0.9 % 100 mL IVPB     1 g 200 mL/hr over 30 Minutes Intravenous Every 24 hours 11/02/17 0850 11/08/17 2359   11/01/17 1000  vancomycin (VANCOCIN) 1,750 mg in sodium chloride 0.9 % 500 mL IVPB  Status:  Discontinued     1,750 mg 250 mL/hr over 120 Minutes Intravenous Every 24 hours 10/31/17  0932 11/02/17 0851   10/31/17 1000  piperacillin-tazobactam (ZOSYN) IVPB 3.375 g  Status:  Discontinued     3.375 g 12.5 mL/hr over 240 Minutes Intravenous Every 8 hours 10/31/17 0928 11/02/17 0850   10/31/17 1000  vancomycin (VANCOCIN) 2,000 mg in sodium chloride 0.9 % 500 mL IVPB     2,000 mg 250 mL/hr over 120 Minutes Intravenous  Once 10/31/17 0932 10/31/17 1334       Diarrhea. - C diff PCR negative - place Cortrak and start po meds etc.   DM type II. - extubated byut on insuling gtt on phase 2 ICU prtocol - will change to Q4 resistant scale  CAD,HLD. HTN emergency with chronic diastolic CHF. - continue ASA, hydralazine, lopressor - hold lisinopril with elevated creatinine - lasix 40 mg IV daily (volume overloaded +)  CKD 2. - foley clogged with sediment 9/09 -  baseline creatinine 1.35 - irrigate foley - monitor urine outpt - keep foley in for now  Hypernatremia  - start d5w  Hypothyroidism. - continue synthroid  SUMMARY OF TODAY'S PLAN:  Extubated 9/13. Move to neuro tele under triad - d/w Dr Thereasa Solo who will pick up 11/05/17   Best Practice / Goals of Care / Disposition.   DVT PROPHYLAXIS: SQ heparin SUP: Protonix NUTRITION: NPO MOBILITY: advance activity as able GOALS OF CARE: full code FAMILY DISCUSSIONS:  Updated family at bedside \

## 2017-11-04 NOTE — Progress Notes (Signed)
RT note: patient resting comfortably with nasal cannula at this time.  No distress noted.  Patient auscultated and lung fields sound rhonchus this AM.  Patient CPT performed with no issues and given scheduled AM treatment.  Will continue to monitor.

## 2017-11-04 NOTE — Progress Notes (Signed)
  Speech Language Pathology Treatment: Dysphagia  Patient Details Name: Daniel [name] MRN: [id number] DOB: 05/14/1965 Today's Date: 11/04/2017 Time: 1115-1200 SLP Time Calculation (min) (ACUTE ONLY): 45 min  Assessment / Plan / Recommendation Clinical Impression  Patient seen to address dysphagia goals. Patient very lethargic but able to be aroused and alertness improved as session progressed. Patient is still not able to achieve voicing and during today's session, he spoke in a whisper-quality voice. Intelligibility varied but was overall very poor due to decreased respiratory support for phonation, significantly limited ROM with tongue(only able to protrude just about to lips). SLP observed patient with ice chips and after significant cues and demonstration, he was able to chew but not achieve a rotary chew and was not able to manipulate ice chips with tongue. Patient was able to swallow, with good laryngeal elevation. He exhibited immediate cough with ice chip trials approximately 75% of the time.  SLP educated Mom regarding patient's current level of function, reasoning for not recommending PO's at this time and discussion of patient's inability to voice. Mom voiced understanding and agreement.     HPI HPI: 52 yo male presented to [facility] with altered mental status, confusion, headaches, hallucinations, and falls.  SBP was over 200.  In ER at [facility] he was started on esmolol gtt.  Developed seizure in [place] at [place].  He continued to have seizures and transferred to [facility].  He was started on burst suppression with versed and required intubation 9/07.  Developed fever and increased sputum 9/10. Extubated 9/13.       SLP Plan  Continue with current plan of care;Other (Comment)(FEES vs. MBS )       Recommendations  Diet recommendations: NPO Medication Administration: Via alternative means                Follow up Recommendations: Other (comment)(TBD) SLP Visit Diagnosis:  Dysphagia, oropharyngeal phase (R13.12) Plan: Continue with current plan of care;Other (Comment)(FEES vs. MBS )       [place], MA, CCC-SLP 11/04/17 1:22 PM

## 2017-11-04 NOTE — Progress Notes (Signed)
[facility] ADULT ICU REPLACEMENT PROTOCOL FOR AM LAB REPLACEMENT ONLY  The patient does apply for the [facility] Adult ICU Electrolyte Replacment Protocol based on the criteria listed below:   1. Is GFR >/= 40 ml/min? Yes.    Patient's GFR today is >60 2. Is urine output >/= 0.5 ml/kg/hr for the last 6 hours? Yes.   Patient's UOP is 0.8 ml/kg/hr 3. Is BUN < 60 mg/dL? Yes.    Patient's BUN today is 46 4. Abnormal electrolyte(s): K3.0 5. Ordered repletion with: per protocol 6. If a panic level lab has been reported, has the CCM MD in charge been notified? Yes.  .   Physician:  Patricia Nettle, MD  [place] [place] 11/04/2017 6:41 AM

## 2017-11-04 NOTE — Progress Notes (Signed)
Neuro: Pt following commands and able to move all extremities at this time. Pt slightly weaker in right side. No seizure activity noted.    Respiratory: Pt remains on 3L o2 via [place] at this time. O2 sats WNL.  Cardiovascular: Pt remains in sinus rhythm/tach with no ectopy at this time. BP WNl, soft at times. Pt with generalized edema.    GI/GU: Pt with foley in place with adequate urine output. Pt with 1600 ml of urine output throughout shift, rectal tube in place. Pt with cortrak placed today.   Skin: Skin intact with no s/s of skin breakdown at this time. Pt continues to be a Copy turn.   Pain: Pt with no complaints of pain throughout shift.    Events: NO acute events throughout shift. Pts plan of care to continue with current regimen, Family updated and no further questions at this time.

## 2017-11-04 NOTE — Progress Notes (Signed)
Pt.'s BP sustaining SBP >160. Notified MD of changing PO BP meds to IV due to NPO status.   MD orded meds to be changed to IV and addition of PRN Labetalol for BP goals.   Will continue to monitor.   Omelia Blackwater RN, BSN

## 2017-11-04 NOTE — Progress Notes (Signed)
Inpatient Rehabilitation  Per PT request, patient was screened by Gunnar Fusi for appropriateness for an Inpatient Acute Rehab consult.  At this time we follow for therapy tolerance prior to requesting an IP Rehab consult.  Call if questions.  Carmelia Roller., CCC/SLP Admission Coordinator  [facility]  Cell [phone]

## 2017-11-04 NOTE — Evaluation (Addendum)
Physical Therapy Evaluation Patient Details Name: Daniel [name] MRN: [id number] DOB: [birth year] Today's Date: 11/04/2017   History of Present Illness  52 yo male presented to [facility] with altered mental status, confusion, headaches, hallucinations, and falls. SBP was over 200. In ER at [facility] he was started on esmolol gtt. Developed seizure in [place] at [place]. He continued to have seizures and transferred to [facility]. He was started on burst suppression with versed and required intubation 9/07. Developed fever and increased sputum 9/10.  Intubated 9/10-9/13.    Clinical Impression  Pt admitted with above diagnosis. Pt currently with functional limitations due to the deficits listed below (see PT Problem List). Pt was able to participate in bed level evaluation and was actively assisting with all UE and LE exercises.  Pt on 3L O2 with VSS.  Pt max assist of 2 for bed mobility therefore will ask for Vital Go bed and bring incr assist to get pt to eOB next treatment.  Feel that pt is a good rehab candidate. Will follow acutely.  Pt will benefit from skilled PT to increase their independence and safety with mobility to allow discharge to the venue listed below.      Follow Up Recommendations CIR;Supervision/Assistance - 24 hour    Equipment Recommendations  Other (comment)(TBA)    Recommendations for Other Services Rehab consult     Precautions / Restrictions Precautions Precautions: Fall Restrictions Weight Bearing Restrictions: No      Mobility  Bed Mobility Overal bed mobility: Needs Assistance Bed Mobility: Rolling Rolling: Max assist;+2 for physical assistance         General bed mobility comments: Pt needed max assist of 2  and cues to roll   Transfers                 General transfer comment: NT  Ambulation/Gait             General Gait Details: unable  Stairs            Wheelchair Mobility    Modified Rankin (Stroke Patients Only)        Balance                                             Pertinent Vitals/Pain Pain Assessment: Faces Pain Score: 5  Faces Pain Scale: Hurts even more Pain Location: back Pain Descriptors / Indicators: Aching;Discomfort;Constant Pain Intervention(s): Limited activity within patient's tolerance;Monitored during session;Repositioned    Home Living Family/patient expects to be discharged to:: Private residence Living Arrangements: Spouse/significant other;Other relatives Available Help at Discharge: Family;Available 24 hours/day Type of Home: House         Home Equipment: None Additional Comments: Wife states pt was independent up until a month ago.  Pt began incr confusion and neeeding assist to drive and perform ADLs.      Prior Function Level of Independence: Needs assistance   Gait / Transfers Assistance Needed: Walked without help until a few days before coming to hospital   ADL's / Homemaking Assistance Needed: Needed wife Assist about a month prior to coming to hospital        Hand Dominance        Extremity/Trunk Assessment   Upper Extremity Assessment Upper Extremity Assessment: Defer to OT evaluation    Lower Extremity Assessment Lower Extremity Assessment: RLE deficits/detail;LLE deficits/detail RLE Deficits / Details:  grossly 2-/5 LLE Deficits / Details: grossly 2-/5       Communication   Communication: Receptive difficulties(whispers)  Cognition Arousal/Alertness: Awake/alert Behavior During Therapy: Flat affect Overall Cognitive Status: Impaired/Different from baseline Area of Impairment: Orientation;Following commands;Safety/judgement;Awareness;Problem solving;Attention                 Orientation Level: Disoriented to;Situation;Time;Place Current Attention Level: Focused   Following Commands: Follows one step commands consistently Safety/Judgement: Decreased awareness of safety;Decreased awareness of deficits    Problem Solving: Requires verbal cues;Requires tactile cues        General Comments   Wife educated in assisting with UE and LE AA/ROM and returns demonstration   Exercises General Exercises - Upper Extremity Shoulder Flexion: AAROM;Both;10 reps;Supine Shoulder ABduction: AAROM;Both;10 reps;Supine Shoulder Horizontal ADduction: AAROM;Both;10 reps;Supine Elbow Flexion: AAROM;Both;10 reps;Supine Elbow Extension: AAROM;Both;10 reps;Supine General Exercises - Lower Extremity Ankle Circles/Pumps: AROM;Both;10 reps;Supine Quad Sets: AROM;Both;10 reps;Supine Heel Slides: AAROM;Both;10 reps;Supine   Assessment/Plan    PT Assessment Patient needs continued PT services  PT Problem List Decreased activity tolerance;Decreased balance;Decreased mobility;Decreased knowledge of use of DME;Decreased safety awareness;Decreased knowledge of precautions       PT Treatment Interventions DME instruction;Gait training;Functional mobility training;Therapeutic activities;Therapeutic exercise;Balance training;Patient/family education    PT Goals (Current goals can be found in the Care Plan section)  Acute Rehab PT Goals Patient Stated Goal: to get better to go home PT Goal Formulation: With patient Time For Goal Achievement: 11/18/17 Potential to Achieve Goals: Good    Frequency Min 3X/week   Barriers to discharge        Co-evaluation               AM-PAC PT "6 Clicks" Daily Activity  Outcome Measure Difficulty turning over in bed (including adjusting bedclothes, sheets and blankets)?: Unable Difficulty moving from lying on back to sitting on the side of the bed? : Unable Difficulty sitting down on and standing up from a chair with arms (e.g., wheelchair, bedside commode, etc,.)?: Unable Help needed moving to and from a bed to chair (including a wheelchair)?: Total Help needed walking in hospital room?: Total Help needed climbing 3-5 steps with a railing? : Total 6 Click Score:  6    End of Session Equipment Utilized During Treatment: Gait belt;Oxygen Activity Tolerance: Patient limited by fatigue Patient left: in bed;with call bell/phone within reach;with bed alarm set;with family/visitor present Nurse Communication: Mobility status;Need for lift equipment PT Visit Diagnosis: Muscle weakness (generalized) (M62.81)    Time: 3343-5686 PT Time Calculation (min) (ACUTE ONLY): 19 min   Charges:   PT Evaluation $PT Eval Moderate Complexity: [place] Pager:  [phone]  Office:  [phone]    Denice Paradise 11/04/2017, 3:03 PM

## 2017-11-04 NOTE — Progress Notes (Signed)
Cortrak Tube Team Note:  Consult received to place a Cortrak feeding tube.   A 10 F Cortrak tube was placed in the L nare and secured with a nasal bridle at 74 cm. Per the Cortrak monitor reading the tube tip is in distal stomach.   No x-ray is required. RN may begin using tube.   If the tube becomes dislodged please keep the tube and contact the Cortrak team at [email or web address] (password [email or web address]) for replacement.   If after hours and replacement cannot be delayed, place a NG tube and confirm placement with an abdominal x-ray.   Daniel [name] RD, LDN, CNSC Clinical Nutrition Available Tues-Sat via Pager: [phone] 11/04/2017 5:05 PM

## 2017-11-05 ENCOUNTER — Inpatient Hospital Stay (HOSPITAL_COMMUNITY): Payer: Medicaid Other

## 2017-11-05 LAB — GLUCOSE, CAPILLARY
GLUCOSE-CAPILLARY: 217 mg/dL — AB (ref 70–99)
GLUCOSE-CAPILLARY: 192 mg/dL — AB (ref 70–99)
Glucose-Capillary: 216 mg/dL — ABNORMAL HIGH (ref 70–99)
Glucose-Capillary: 189 mg/dL — ABNORMAL HIGH (ref 70–99)
Glucose-Capillary: 186 mg/dL — ABNORMAL HIGH (ref 70–99)

## 2017-11-05 LAB — CULTURE, BLOOD (ROUTINE X 2)
Culture: NO GROWTH
Culture: NO GROWTH
Special Requests: ADEQUATE

## 2017-11-05 LAB — URINALYSIS, ROUTINE W REFLEX MICROSCOPIC
Bilirubin Urine: NEGATIVE
GLUCOSE, UA: NEGATIVE mg/dL
Ketones, ur: NEGATIVE mg/dL
NITRITE: NEGATIVE
PROTEIN: 100 mg/dL — AB
Specific Gravity, Urine: 1.014 (ref 1.005–1.030)
pH: 5 (ref 5.0–8.0)

## 2017-11-05 LAB — CBC WITH DIFFERENTIAL/PLATELET
ABS IMMATURE GRANULOCYTES: 0.3 10*3/uL — AB (ref 0.0–0.1)
BASOS ABS: 0.1 10*3/uL (ref 0.0–0.1)
Basophils Relative: 1 %
EOS ABS: 0.2 10*3/uL (ref 0.0–0.7)
Eosinophils Relative: 2 %
HCT: 45.8 % (ref 39.0–52.0)
Hemoglobin: 13.7 g/dL (ref 13.0–17.0)
Immature Granulocytes: 3 %
Lymphocytes Relative: 22 %
Lymphs Abs: 2.1 10*3/uL (ref 0.7–4.0)
MCH: 27.7 pg (ref 26.0–34.0)
MCHC: 29.9 g/dL — ABNORMAL LOW (ref 30.0–36.0)
MCV: 92.5 fL (ref 78.0–100.0)
MONO ABS: 0.8 10*3/uL (ref 0.1–1.0)
Monocytes Relative: 8 %
NEUTROS ABS: 6.3 10*3/uL (ref 1.7–7.7)
NEUTROS PCT: 66 %
Platelets: 377 10*3/uL (ref 150–400)
RBC: 4.95 MIL/uL (ref 4.22–5.81)
RDW: 14.4 % (ref 11.5–15.5)
WBC: 9.7 10*3/uL (ref 4.0–10.5)

## 2017-11-05 LAB — PHOSPHORUS: PHOSPHORUS: 3.5 mg/dL (ref 2.5–4.6)

## 2017-11-05 LAB — BASIC METABOLIC PANEL
Anion gap: 13 (ref 5–15)
BUN: 51 mg/dL — ABNORMAL HIGH (ref 6–20)
CHLORIDE: 120 mmol/L — AB (ref 98–111)
CO2: 24 mmol/L (ref 22–32)
Calcium: 9.1 mg/dL (ref 8.9–10.3)
Creatinine, Ser: 1.6 mg/dL — ABNORMAL HIGH (ref 0.61–1.24)
GFR, EST AFRICAN AMERICAN: 55 mL/min — AB (ref >60)
GFR, EST NON AFRICAN AMERICAN: 48 mL/min — AB (ref >60)
Glucose, Bld: 231 mg/dL — ABNORMAL HIGH (ref 70–99)
POTASSIUM: 3.3 mmol/L — AB (ref 3.5–5.1)
SODIUM: 157 mmol/L — AB (ref 135–145)

## 2017-11-05 LAB — HEPATIC FUNCTION PANEL
ALT: 17 U/L (ref 0–44)
AST: 20 U/L (ref 15–41)
Albumin: 2.3 g/dL — ABNORMAL LOW (ref 3.5–5.0)
Alkaline Phosphatase: 65 U/L (ref 38–126)
BILIRUBIN DIRECT: 0.1 mg/dL (ref 0.0–0.2)
BILIRUBIN INDIRECT: 0.9 mg/dL (ref 0.3–0.9)
TOTAL PROTEIN: 6.2 g/dL — AB (ref 6.5–8.1)
Total Bilirubin: 1 mg/dL (ref 0.3–1.2)

## 2017-11-05 LAB — LACTIC ACID, PLASMA: LACTIC ACID, VENOUS: 1.1 mmol/L (ref 0.5–1.9)

## 2017-11-05 LAB — MAGNESIUM: MAGNESIUM: 2.1 mg/dL (ref 1.7–2.4)

## 2017-11-05 MED ORDER — FREE WATER
200.0000 mL | Freq: Three times a day (TID) | Status: DC
  Administered 2017-11-05 – 2017-11-06 (×3): 200 mL

## 2017-11-05 MED ORDER — POTASSIUM CHLORIDE 10 MEQ/50ML IV SOLN: 10.0000 meq | INTRAVENOUS | Status: DC

## 2017-11-05 MED ORDER — PNEUMOCOCCAL VAC POLYVALENT 25 MCG/0.5ML IJ INJ
0.5000 mL | INJECTION | INTRAMUSCULAR | Status: AC
  Administered 2017-11-06: 0.5 mL via INTRAMUSCULAR
  Filled 2017-11-05: qty 0.5

## 2017-11-05 MED ORDER — IPRATROPIUM-ALBUTEROL 0.5-2.5 (3) MG/3ML IN SOLN
3.0000 mL | Freq: Three times a day (TID) | RESPIRATORY_TRACT | Status: DC
  Administered 2017-11-05 – 2017-11-07 (×7): 3 mL via RESPIRATORY_TRACT
  Filled 2017-11-05 (×8): qty 3

## 2017-11-05 MED ORDER — POTASSIUM CHLORIDE 20 MEQ/15ML (10%) PO SOLN
40.0000 meq | Freq: Once | ORAL | Status: AC
  Administered 2017-11-05: 40 meq
  Filled 2017-11-05: qty 30

## 2017-11-05 NOTE — Progress Notes (Signed)
[facility] Progress Note Patient Name: Daniel [name] DOB: 07/30/1965 MRN: [id number]   Date of Service  11/05/2017  HPI/Events of Note  Hypokalemia  eICU Interventions  Potassium replaced     Intervention Category Minor Interventions: Electrolytes abnormality - evaluation and management  DETERDING,ELIZABETH 11/05/2017, 5:44 AM

## 2017-11-05 NOTE — Progress Notes (Signed)
PROGRESS NOTE    Daniel [name]  WNU:[id number] DOB: 01/15/1966 DOA: 10/27/2017 PCP: Antonietta Jewel, MD   Brief Narrative:  51 yo male with prior h/o IDDM, HTN, OSA, OHS, Seizures, PE presented to [facility] hospital with altered mental status, confusion, headaches, hallucinations, and falls. SBP was over 200. In ER at [facility] he was started on esmolol gtt. Developed seizure in [place] at [place]. He continued to have seizures and transferred to [facility]. He was  intubated 9/07 and admitted to [facility] service. 9/14 - deemed severe aspiration risk post extubation. Family says he uses CPAP at night.  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 PT/OT Speech after extubation, .   Acute respiratory failure with hypoxia sec to OSA, OHS, CAP from haemophilus influenza.  - resume IV rocephin, [place] oxygen tokeep sats greater than 90%.  Repeat CXR today.    Type 2 DM:  CBG (last 3)  Recent Labs    11/05/17 0743 11/05/17 1126 11/05/17 1541  GLUCAP 192* 216* 189*   Resume SSI.    Diarrhea:   c diff pcr negative.    Stage 2 ckd:  Foley in place.    Hypernatremia:  Free water boluses started today. 246ml every 8 hours.    Hypothyroidism:  Resume synthroid.        DVT prophylaxis: sq heparin Code Status: (full code. ) Family Communication: family at bedside Disposition Plan: pending clinical improvement.    Consultants:   None.    Procedures:  9/07 Admit and intubated. ETT 9/07 >> Lt IJ CVL 9/07 >>  EEG 9/07 >> focal non convulsive status epilepticus from Rt occipital region LP 9/08 >> RBC 99, WBC 4, glucose 104, protein 81 Sputum cultures from 9/10  grew haemophilus influenzae.  9/13 Extubate C diff PCR 9/12  >>negative   Antimicrobials:rocephin since 9/12.    Subjective: Febrile this am,   Objective: Vitals:   11/05/17 1357 11/05/17 1400 11/05/17 1430 11/05/17 1454  BP:  131/73 128/64   Pulse:  94 95   Resp:  (!) 28 (!) 27   Temp: 100.1 F (37.8 C)     TempSrc: Oral     SpO2:  93% 95% 98%  Weight:      Height:        Intake/Output Summary (Last 24 hours) at 11/05/2017 1524 Last data filed at 11/05/2017 1500 Gross per 24 hour  Intake 1283.56 ml  Output 3110 ml  Net -1826.44 ml   Filed Weights   11/03/17 0500 11/04/17 0445 11/05/17 0400  Weight: 135.1 kg 134.3 kg 133.5 kg    Examination:  General exam: lethargic on 2 lit of [place] oxygen with cor track in the nostril Respiratory system: diminished at bases, no wheezing or rhonchi.  Cardiovascular system: S1 & S2 heard, RRR. Gastrointestinal system: Abdomen is nondistended,soft bowel sounds okay.  Central nervous system: lethargic, confused, not able to hold conversations.  Extremities: 2+ edema of the upper extremities. And 1 edema of the lower extremities.  Skin: No rashes, lesions or ulcers Psychiatry: unable to evaluate due to lethargy.     Data Reviewed: I have personally reviewed following labs and imaging studies  CBC: Recent Labs  Lab 11/01/17 0503 11/02/17 0500 11/03/17 0500 11/04/17 0448 11/05/17 0017  WBC 8.8 8.7 9.3  9.8 9.7  NEUTROABS  --   --   --   --  6.3  HGB 11.0* 12.0* 13.3 13.9 13.7  HCT 37.1* 40.4 44.1 46.1 45.8  MCV 92.8 94.0 91.9 92.2 92.5  PLT 261 274 327 345 884   Basic Metabolic Panel: Recent Labs  Lab 10/31/17 0905 10/31/17 1812 11/01/17 0503 11/01/17 1700 11/02/17 0500 11/03/17 0500 11/04/17 0448 11/05/17 0017  NA  --   --  142  --  146* 149* 153* 157*  K  --   --  3.8  --  4.2 3.1* 3.0* 3.3*  CL  --   --  111  --  114* 114* 117* 120*  CO2  --   --  21*  --  23 24 27 24   GLUCOSE  --   --  234*  --  241* 242* 181* 231*  BUN  --   --  55*  --  66* 55* 46* 51*  CREATININE   --   --  2.14*  --  2.00* 1.57* 1.28* 1.60*  CALCIUM  --   --  7.8*  --  8.3* 8.9 9.2 9.1  MG 2.0 2.0 2.1 2.0  --   --  2.2 2.1  PHOS 4.0 4.2 4.8* 4.6  --   --   --  3.5   GFR: Estimated Creatinine Clearance: 72.3 mL/min (A) (by C-G formula based on SCr of 1.6 mg/dL (H)). Liver Function Tests: Recent Labs  Lab 10/30/17 0530 10/31/17 0547 11/05/17 0017  AST 15 17 20   ALT 20 18 17   ALKPHOS 54 61 65  BILITOT 0.8 0.5 1.0  PROT 5.0* 5.4* 6.2*  ALBUMIN 2.2* 2.1* 2.3*   No results for input(s): LIPASE, AMYLASE in the last 168 hours. No results for input(s): AMMONIA in the last 168 hours. Coagulation Profile: No results for input(s): INR, PROTIME in the last 168 hours. Cardiac Enzymes: No results for input(s): CKTOTAL, CKMB, CKMBINDEX, TROPONINI in the last 168 hours. BNP (last 3 results) No results for input(s): PROBNP in the last 8760 hours. HbA1C: No results for input(s): HGBA1C in the last 72 hours. CBG: Recent Labs  Lab 11/04/17 1952 11/04/17 2335 11/05/17 0340 11/05/17 0743 11/05/17 1126  GLUCAP 210* 194* 217* 192* 216*   Lipid Profile: Recent Labs    11/03/17 0500  TRIG 447*   Thyroid Function Tests: No results for input(s): TSH, T4TOTAL, FREET4, T3FREE, THYROIDAB in the last 72 hours. Anemia Panel: No results for input(s): VITAMINB12, FOLATE, FERRITIN, TIBC, IRON, RETICCTPCT in the last 72 hours. Sepsis Labs: Recent Labs  Lab 10/31/17 0905 11/01/17 0503 11/02/17 0500 11/05/17 0017  PROCALCITON 0.34 0.54 0.49  --   LATICACIDVEN  --   --   --  1.1    Recent Results (from the past 240 hour(s))  MRSA PCR Screening     Status: None   Collection Time: 10/28/17 10:55 AM  Result Value Ref Range Status   MRSA by PCR NEGATIVE NEGATIVE Final    Comment:        The GeneXpert MRSA Assay (FDA approved for NASAL specimens only), is one component of a comprehensive MRSA colonization surveillance program. It is not intended to diagnose MRSA infection nor to  guide or monitor treatment for MRSA infections. Performed at [facility], [place] [address], [place], [place] [address]   CSF culture     Status: None   Collection Time: 10/28/17  1:46 PM  Result Value Ref Range Status  Specimen Description CSF  Final   Special Requests NONE  Final   Gram Stain   Final    WBC PRESENT, PREDOMINANTLY MONONUCLEAR NO ORGANISMS SEEN CYTOSPIN SMEAR    Culture   Final    NO GROWTH 3 DAYS Performed at [facility], [address], [place], [place] [address]    Report Status 10/31/2017 FINAL  Final  Culture, respiratory (non-expectorated)     Status: None   Collection Time: 10/31/17  9:01 AM  Result Value Ref Range Status   Specimen Description TRACHEAL ASPIRATE  Final   Special Requests NONE  Final   Gram Stain   Final    MODERATE WBC PRESENT, PREDOMINANTLY PMN FEW GRAM NEGATIVE RODS RARE GRAM POSITIVE RODS    Culture   Final    MODERATE HAEMOPHILUS INFLUENZAE BETA LACTAMASE NEGATIVE Performed at [facility], [address], [place], [place] [address]    Report Status 11/01/2017 FINAL  Final  Culture, blood (Routine X 2) w Reflex to ID Panel     Status: None   Collection Time: 10/31/17 11:04 AM  Result Value Ref Range Status   Specimen Description BLOOD BLOOD RIGHT HAND  Final   Special Requests   Final    BOTTLES DRAWN AEROBIC ONLY Blood Culture adequate volume   Culture   Final    NO GROWTH 5 DAYS Performed at [facility], [place] [address] [place], [place] [address]    Report Status 11/05/2017 FINAL  Final  Culture, blood (Routine X 2) w Reflex to ID Panel     Status: None   Collection Time: 10/31/17 11:08 AM  Result Value Ref Range Status   Specimen Description BLOOD BLOOD RIGHT HAND  Final   Special Requests   Final    BOTTLES DRAWN AEROBIC ONLY Blood Culture results may not be optimal due to an inadequate volume of blood received in culture bottles   Culture   Final    NO GROWTH 5 DAYS Performed at [facility], [place] [address] [place], [place] [address]    Report Status 11/05/2017 FINAL  Final  C difficile quick scan w PCR reflex     Status: None   Collection Time: 11/02/17  8:30 AM  Result Value Ref Range Status   C Diff antigen NEGATIVE NEGATIVE Final   C Diff toxin NEGATIVE NEGATIVE Final   C Diff interpretation No C. difficile detected.  Final    Comment: Performed at [facility], [place] [address] [place], [place] [address]         Radiology Studies: Dg Chest Port 1 View  Result Date: 11/05/2017 CLINICAL DATA:  Fever EXAM: PORTABLE CHEST 1 VIEW COMPARISON:  11/03/2017 FINDINGS: Heart size is normal. Soft feeding tube enters the abdomen. Mild patchy atelectasis and or infiltrate at both lung bases. No dense consolidation or lobar collapse. No edema or effusions. IMPRESSION: Patchy density at both lung bases consistent with atelectasis or mild pneumonia. Electronically Signed   By: Nelson Chimes M.D.   On: 11/05/2017 13:49        Scheduled Meds: . aspirin  81 mg Oral Daily  . budesonide (PULMICORT) nebulizer solution  0.25 mg Nebulization BID  . chlorhexidine  15 mL Mouth Rinse BID  . free water  200 mL Per Tube Q8H  . furosemide  40 mg Intravenous Daily  . heparin injection (subcutaneous)  5,000 Units Subcutaneous Q8H  . hydrALAZINE  25 mg Oral  Q6H  . insulin aspart  0-20 Units Subcutaneous Q4H  . ipratropium-albuterol  3 mL Nebulization TID  . levothyroxine  50 mcg Oral QAC breakfast  . mouth rinse  15 mL Mouth Rinse q12n4p  . metoprolol tartrate  5 mg Intravenous Q6H  . pantoprazole sodium  40 mg Per Tube Q24H  . [START ON 11/06/2017] pneumococcal 23 valent vaccine  0.5 mL Intramuscular Tomorrow-1000   Continuous Infusions: . sodium chloride Stopped (11/04/17 1603)  . cefTRIAXone (ROCEPHIN)  IV Stopped (11/05/17 0855)  . dextrose 10 mL/hr at 11/05/17 1400  . lacosamide (VIMPAT) IV Stopped (11/05/17 1035)  . levETIRAcetam Stopped (11/05/17 0921)  .  phenytoin (DILANTIN) IV 206 mL/hr at 11/05/17 1400     LOS: 9 days    Time spent: 35 minutes.     Hosie Poisson, MD [facility] Pager [phone]  If 7PM-7AM, please contact night-coverage [email or web address] Password [facility] 11/05/2017, 3:24 PM

## 2017-11-05 NOTE — Progress Notes (Signed)
CPAP held at this time. Pt had Cortrack placed today, unable to get adequate seal. Pt tolerating 3L [place] at this time. RT will continue to monitor.

## 2017-11-05 NOTE — Progress Notes (Signed)
Received patient from ICU, assessment completed, VS documented, oriented patient to the room.  Will continue to monitor.  ?

## 2017-11-05 NOTE — Progress Notes (Signed)
Looks stable on eye ball rounds Remains edxtubated PRimary is triad Ccm will sign off    SIGNATURE    Dr. Brand Males, M.D., F.C.C.P,  Pulmonary and Critical Care Medicine Staff Physician, [place] Director - Interstitial Lung Disease  Program  Pulmonary [place] at [place], [place], [address]  Pager: [phone], If no answer or between  15:00h - 7:00h: call [phone] Telephone: [phone]  11:36 AM 11/05/2017

## 2017-11-05 NOTE — Progress Notes (Signed)
Pt has cpap order but has NG in place.  RT will hold cpap at this time and monitor.  RN and RT agree he may need more liter flow from O2 than day time settings.  WE will adjust as needed.  Pt seems confused as well.  Cpap may be contraindicated at this time.  RT will monitor.

## 2017-11-06 ENCOUNTER — Inpatient Hospital Stay (HOSPITAL_COMMUNITY): Payer: Medicaid Other

## 2017-11-06 DIAGNOSIS — M7989 Other specified soft tissue disorders: Secondary | ICD-10-CM

## 2017-11-06 LAB — CBC WITH DIFFERENTIAL/PLATELET
ABS IMMATURE GRANULOCYTES: 0.2 10*3/uL — AB (ref 0.0–0.1)
BASOS ABS: 0.1 10*3/uL (ref 0.0–0.1)
Basophils Relative: 1 %
Eosinophils Absolute: 0.2 10*3/uL (ref 0.0–0.7)
Eosinophils Relative: 2 %
HCT: 48.9 % (ref 39.0–52.0)
HEMOGLOBIN: 14.2 g/dL (ref 13.0–17.0)
IMMATURE GRANULOCYTES: 2 %
LYMPHS PCT: 31 %
Lymphs Abs: 2.6 10*3/uL (ref 0.7–4.0)
MCH: 27.4 pg (ref 26.0–34.0)
MCHC: 29 g/dL — ABNORMAL LOW (ref 30.0–36.0)
MCV: 94.4 fL (ref 78.0–100.0)
MONO ABS: 0.8 10*3/uL (ref 0.1–1.0)
Monocytes Relative: 9 %
NEUTROS ABS: 4.6 10*3/uL (ref 1.7–7.7)
NEUTROS PCT: 55 %
PLATELETS: 342 10*3/uL (ref 150–400)
RBC: 5.18 MIL/uL (ref 4.22–5.81)
RDW: 14.3 % (ref 11.5–15.5)
WBC: 8.4 10*3/uL (ref 4.0–10.5)

## 2017-11-06 LAB — GLUCOSE, CAPILLARY
GLUCOSE-CAPILLARY: 189 mg/dL — AB (ref 70–99)
GLUCOSE-CAPILLARY: 201 mg/dL — AB (ref 70–99)
GLUCOSE-CAPILLARY: 158 mg/dL — AB (ref 70–99)
Glucose-Capillary: 173 mg/dL — ABNORMAL HIGH (ref 70–99)
Glucose-Capillary: 208 mg/dL — ABNORMAL HIGH (ref 70–99)
Glucose-Capillary: 274 mg/dL — ABNORMAL HIGH (ref 70–99)

## 2017-11-06 LAB — MAGNESIUM: Magnesium: 2.3 mg/dL (ref 1.7–2.4)

## 2017-11-06 LAB — BASIC METABOLIC PANEL
ANION GAP: 12 (ref 5–15)
Anion gap: 13 (ref 5–15)
BUN: 47 mg/dL — AB (ref 6–20)
BUN: 46 mg/dL — ABNORMAL HIGH (ref 6–20)
CALCIUM: 8.6 mg/dL — AB (ref 8.9–10.3)
CHLORIDE: 120 mmol/L — AB (ref 98–111)
CO2: 25 mmol/L (ref 22–32)
CO2: 25 mmol/L (ref 22–32)
Calcium: 8.7 mg/dL — ABNORMAL LOW (ref 8.9–10.3)
Chloride: 120 mmol/L — ABNORMAL HIGH (ref 98–111)
Creatinine, Ser: 1.79 mg/dL — ABNORMAL HIGH (ref 0.61–1.24)
Creatinine, Ser: 1.98 mg/dL — ABNORMAL HIGH (ref 0.61–1.24)
GFR, EST AFRICAN AMERICAN: 48 mL/min — AB (ref >60)
GFR, EST AFRICAN AMERICAN: 43 mL/min — AB (ref >60)
GFR, EST NON AFRICAN AMERICAN: 42 mL/min — AB (ref >60)
GFR, EST NON AFRICAN AMERICAN: 37 mL/min — AB (ref >60)
GLUCOSE: 268 mg/dL — AB (ref 70–99)
Glucose, Bld: 209 mg/dL — ABNORMAL HIGH (ref 70–99)
POTASSIUM: 3.5 mmol/L (ref 3.5–5.1)
Potassium: 3.7 mmol/L (ref 3.5–5.1)
SODIUM: 157 mmol/L — AB (ref 135–145)
Sodium: 158 mmol/L — ABNORMAL HIGH (ref 135–145)

## 2017-11-06 LAB — BLOOD GAS, ARTERIAL
Acid-Base Excess: 5.5 mmol/L — ABNORMAL HIGH (ref 0.0–2.0)
Bicarbonate: 30.3 mmol/L — ABNORMAL HIGH (ref 20.0–28.0)
Drawn by: 36277
O2 CONTENT: 5 L/min
O2 Saturation: 97.7 %
PCO2 ART: 54.7 mmHg — AB (ref 32.0–48.0)
PO2 ART: 113 mmHg — AB (ref 83.0–108.0)
Patient temperature: 101.3
pH, Arterial: 7.37 (ref 7.350–7.450)

## 2017-11-06 LAB — PHOSPHORUS: PHOSPHORUS: 4.1 mg/dL (ref 2.5–4.6)

## 2017-11-06 LAB — PHENYTOIN LEVEL, TOTAL

## 2017-11-06 MED ORDER — RESOURCE THICKENUP CLEAR PO POWD
ORAL | Status: DC | PRN
  Administered 2017-11-07: 22:00:00 via ORAL
  Filled 2017-11-06 (×2): qty 125

## 2017-11-06 MED ORDER — ACETAMINOPHEN 10 MG/ML IV SOLN
1000.0000 mg | Freq: Once | INTRAVENOUS | Status: AC
  Administered 2017-11-07: 1000 mg via INTRAVENOUS
  Filled 2017-11-06 (×3): qty 100

## 2017-11-06 MED ORDER — LACTATED RINGERS IV SOLN
INTRAVENOUS | Status: DC
  Administered 2017-11-07: via INTRAVENOUS

## 2017-11-06 MED ORDER — LACTATED RINGERS IV BOLUS
500.0000 mL | Freq: Once | INTRAVENOUS | Status: AC
  Administered 2017-11-07: 500 mL via INTRAVENOUS

## 2017-11-06 NOTE — Progress Notes (Signed)
Physical Therapy Treatment Patient Details Name: Daniel [name] MRN: [id number] DOB: 01/01/1966 Today's Date: 11/06/2017    History of Present Illness 52 yo male presented to [facility] with altered mental status, confusion, headaches, hallucinations, and falls. SBP was over 200. In ER at [facility] he was started on esmolol gtt. Developed seizure in [place] at [place]. He continued to have seizures and transferred to [facility]. He was started on burst suppression with versed and required intubation 9/07. Developed fever and increased sputum 9/10.  Intubated 9/10-9/13.      PT Comments    Patient lethargic during session opening eyes intermittently. Responding to some questions asked. Demonstrates impaired cognition and generalized weakness as well as decreased arousal impacting mobility today.Tolerated rolling to right/left x3 due to being found saturated in gastric fluids from NG valve being left open. Rn notified. Pt thinks he got chocolate in his eye causing it to hurt. Pt not able to participate much in mobility today. Would benefit from vital go tilt bed to help progress mobility safely as pt currently requires assist of 3. At this time, pt not able to tolerate intensity for CIR so recommendation updated to SNF. Will continue to assess appropriateness for CIR. Will follow.   Follow Up Recommendations  SNF;Supervision/Assistance - 24 hour     Equipment Recommendations  None recommended by PT    Recommendations for Other Services       Precautions / Restrictions Precautions Precautions: Fall Precaution Comments: NG tube Restrictions Weight Bearing Restrictions: No    Mobility  Bed Mobility Overal bed mobility: Needs Assistance Bed Mobility: Rolling Rolling: Total assist;+2 for physical assistance(+3 assist)         General bed mobility comments: Total assist to roll this session for cleaning up after NG tube leaking. Rolled to right/left x3 to change pad, gown, linens  etc.  Transfers                 General transfer comment: Unsafe to attempt  Ambulation/Gait             General Gait Details: unable   Stairs             Wheelchair Mobility    Modified Rankin (Stroke Patients Only)       Balance                                            Cognition Arousal/Alertness: Lethargic Behavior During Therapy: Flat affect Overall Cognitive Status: Impaired/Different from baseline Area of Impairment: Orientation;Attention;Following commands;Safety/judgement;Awareness;Problem solving                 Orientation Level: Disoriented to;Situation Current Attention Level: Focused   Following Commands: Follows one step commands inconsistently Safety/Judgement: Decreased awareness of safety;Decreased awareness of deficits   Problem Solving: Slow processing;Requires verbal cues;Requires tactile cues General Comments: Pt closing his eyes and sleeping throughout most of session despite pt being rolled. Able to answer some questions. Raspy voice. talking about getting chocolate in his eye and that is why it hurts.      Exercises      General Comments General comments (skin integrity, edema, etc.): Pt saturated in gastic fluids on arrival to room as valve was left open, IV was out. RN notified.      Pertinent Vitals/Pain Pain Assessment: Faces Faces Pain Scale: Hurts a little bit Pain Location: L eye  Pain Descriptors / Indicators: Grimacing Pain Intervention(s): Monitored during session    Home Living Family/patient expects to be discharged to:: Private residence Living Arrangements: Spouse/significant other;Other relatives Available Help at Discharge: Family;Available 24 hours/day Type of Home: House       Home Equipment: None Additional Comments: Information per chart. Pt unable to provide and family left room during evaluation.     Prior Function Level of Independence: Needs assistance  Gait /  Transfers Assistance Needed: Walked without help until a few days before coming to hospital  ADL's / Homemaking Assistance Needed: Needed wife Assist about a month prior to coming to hospital Comments: Pt was independent until 1 month ago when confusion began to increase.    PT Goals (current goals can now be found in the care plan section) Acute Rehab PT Goals Patient Stated Goal: unable to state Progress towards PT goals: Not progressing toward goals - comment(secondary to lethargy)    Frequency    Min 3X/week      PT Plan Discharge plan needs to be updated    Co-evaluation PT/OT/SLP Co-Evaluation/Treatment: Yes Reason for Co-Treatment: Complexity of the patient's impairments (multi-system involvement);Necessary to address cognition/behavior during functional activity;For patient/therapist safety;To address functional/ADL transfers PT goals addressed during session: Mobility/safety with mobility;Balance OT goals addressed during session: ADL's and self-care      AM-PAC PT "6 Clicks" Daily Activity  Outcome Measure  Difficulty turning over in bed (including adjusting bedclothes, sheets and blankets)?: Unable Difficulty moving from lying on back to sitting on the side of the bed? : Unable Difficulty sitting down on and standing up from a chair with arms (e.g., wheelchair, bedside commode, etc,.)?: Unable Help needed moving to and from a bed to chair (including a wheelchair)?: Total Help needed walking in hospital room?: Total Help needed climbing 3-5 steps with a railing? : Total 6 Click Score: 6    End of Session Equipment Utilized During Treatment: Gait belt;Oxygen Activity Tolerance: Patient limited by lethargy Patient left: in bed;with call bell/phone within reach;with bed alarm set Nurse Communication: Need for lift equipment;Mobility status PT Visit Diagnosis: Muscle weakness (generalized) (M62.81)     Time: 9179-1505 PT Time Calculation (min) (ACUTE ONLY): 23  min  Charges:  $Therapeutic Activity: 8-22 mins                     Wray Kearns, PT, DPT Acute Rehabilitation Services Pager [phone] [place] 11/06/2017, 12:18 PM

## 2017-11-06 NOTE — Progress Notes (Signed)
Patient was not responsive, had a high temp. RR was called and Dr. Baltazar Najjar was notified. CT stat of the head was done, awaiting results.

## 2017-11-06 NOTE — Progress Notes (Signed)
Inpatient Diabetes Program Recommendations  AACE/ADA: New Consensus Statement on Inpatient Glycemic Control (2015)  Target Ranges:  Prepandial:   less than 140 mg/dL      Peak postprandial:   less than 180 mg/dL (1-2 hours)      Critically ill patients:  140 - 180 mg/dL   Lab Results  Component Value Date   GLUCAP 158 (H) 11/06/2017   HGBA1C 15.8 (H) 10/28/2017    Review of Glycemic ControlResults for AKIA, DESROCHES (MRN [id number]) as of 11/06/2017 12:04  Ref. Range 11/05/2017 15:41 11/05/2017 20:21 11/06/2017 01:03 11/06/2017 04:06 11/06/2017 09:25  Glucose-Capillary Latest Ref Range: 70 - 99 mg/dL 189 (H) 186 (H) 189 (H) 201 (H) 158 (H)   Diabetes history: Type 2 DM  Outpatient Diabetes medications: Levemir 120 units daily, Metformin 500 mg bid Current orders for Inpatient glycemic control:  Novolog resistant q 4 hours Inpatient Diabetes Program Recommendations:   May consider adding Levemir 15 units daily.   Thanks  Adah Perl, RN, BC-ADM Inpatient Diabetes Coordinator Pager [phone] (8a-5p)

## 2017-11-06 NOTE — Progress Notes (Signed)
Pt confused at this time.  No cpap placed on pt.  Rapid response, [place], with pt now for AMS

## 2017-11-06 NOTE — Evaluation (Addendum)
Occupational Therapy Evaluation Patient Details Name: Daniel [name] MRN: [id number] DOB: [birth year] Today's Date: 11/06/2017    History of Present Illness 52 yo male presented to [facility] with altered mental status, confusion, headaches, hallucinations, and falls. SBP was over 200. In ER at [facility] he was started on esmolol gtt. Developed seizure in [place] at [place]. He continued to have seizures and transferred to [facility]. He was started on burst suppression with versed and required intubation 9/07. Developed fever and increased sputum 9/10.  Intubated 9/10-9/13.     Clinical Impression   Per chart, pt was independent with ADL until approximately 1 month prior to hospitalization when he began to need assistance with ADL and was able to ambulate with assistance. Pt currently requiring overall total assistance for ADL participation due to decreased cognition, decreased strength, and decreased functional use of vision. He was soaked with gastric fluids from [place] on arrival and assisted to clean pt up and notified RN. PIV also inadvertently removed during mobility and notified RN. Pt would benefit from continued OT services while admitted to improve independence and safety with ADL and functional mobility. Recommend SNF level rehabilitation post-acute D/C to maximize participation in ADL tasks. OT will continue to follow while admitted.     Follow Up Recommendations  SNF;Supervision/Assistance - 24 hour    Equipment Recommendations  Other (comment)(TBD at next venue of care)    Recommendations for Other Services       Precautions / Restrictions Precautions Precautions: Fall Restrictions Weight Bearing Restrictions: No      Mobility Bed Mobility Overal bed mobility: Needs Assistance Bed Mobility: Rolling Rolling: Total assist;+2 for physical assistance(+3 assist)         General bed mobility comments: Total assist to roll this session for cleaning up after NG tube  leaking.   Transfers                 General transfer comment: Unsafe to attempt    Balance                                           ADL either performed or assessed with clinical judgement   ADL Overall ADL's : Needs assistance/impaired                                       General ADL Comments: Requiring total assistance for all ADL.      Vision Baseline Vision/History: (closing L eye and stating that his eye hurts) Patient Visual Report: Eye fatigue/eye pain/headache(L eye pain) Vision Assessment?: Vision impaired- to be further tested in functional context Additional Comments: Pt closing L eye for majority of session. Unable to progress with functional assessment due to cognition.      Perception     Praxis      Pertinent Vitals/Pain Pain Assessment: Faces Faces Pain Scale: Hurts a little bit Pain Location: L eye Pain Intervention(s): Monitored during session     Hand Dominance     Extremity/Trunk Assessment Upper Extremity Assessment Upper Extremity Assessment: Generalized weakness;RUE deficits/detail;LUE deficits/detail RUE Deficits / Details: 2-/5 strength with significant edema LUE Deficits / Details: 3-/5 strength with edema noted.    Lower Extremity Assessment Lower Extremity Assessment: Defer to PT evaluation       Communication Communication Communication: Receptive  difficulties;Expressive difficulties(whispering)   Cognition Arousal/Alertness: Lethargic Behavior During Therapy: Flat affect Overall Cognitive Status: Impaired/Different from baseline Area of Impairment: Orientation;Attention;Following commands;Safety/judgement;Awareness;Problem solving                 Orientation Level: Disoriented to;Situation(Able to state that he is in the hospital. ) Current Attention Level: Focused   Following Commands: Follows one step commands inconsistently(lethargy limiting today) Safety/Judgement:  Decreased awareness of safety;Decreased awareness of deficits   Problem Solving: Slow processing;Requires verbal cues;Requires tactile cues General Comments: Pt closing his eyes and sleeping throughout most of session despite pt being rolled. Able to answer some questions.    General Comments  Pt soaked with gastric fluids on arrival as valve was not closed. Notified RN and assisted to clean pt up.     Exercises     Shoulder Instructions      Home Living Family/patient expects to be discharged to:: Private residence Living Arrangements: Spouse/significant other;Other relatives Available Help at Discharge: Family;Available 24 hours/day Type of Home: House                       Home Equipment: None   Additional Comments: Information per chart. Pt unable to provide and family left room during evaluation.       Prior Functioning/Environment Level of Independence: Needs assistance  Gait / Transfers Assistance Needed: Walked without help until a few days before coming to hospital  ADL's / Homemaking Assistance Needed: Needed wife Assist about a month prior to coming to hospital   Comments: Pt was independent until 1 month ago when confusion began to increase.         OT Problem List: Decreased strength;Decreased range of motion;Decreased activity tolerance;Impaired balance (sitting and/or standing);Decreased cognition;Decreased safety awareness;Decreased knowledge of use of DME or AE;Decreased knowledge of precautions;Pain;Impaired vision/perception      OT Treatment/Interventions: Self-care/ADL training;Therapeutic exercise;Energy conservation;DME and/or AE instruction;Patient/family education;Balance training;Therapeutic activities;Cognitive remediation/compensation;Visual/perceptual remediation/compensation    OT Goals(Current goals can be found in the care plan section) Acute Rehab OT Goals Patient Stated Goal: unable to state OT Goal Formulation: Patient unable to  participate in goal setting Time For Goal Achievement: 11/20/17 Potential to Achieve Goals: Fair ADL Goals Pt Will Perform Grooming: with min assist;bed level Additional ADL Goal #1: Pt will complete bed mobility with overall mod assist in preparation for ADL participation seated at EOB. Additional ADL Goal #2: Pt will demonstrate ability to follow 2/3 one-step commands with no more than 1 cue for attention. Additional ADL Goal #3: Pt will maintain alert state to participate in 10 minutes of ADL tasks seated at EOB.  OT Frequency: Min 2X/week   Barriers to D/C:            Co-evaluation PT/OT/SLP Co-Evaluation/Treatment: Yes Reason for Co-Treatment: Complexity of the patient's impairments (multi-system involvement);Necessary to address cognition/behavior during functional activity;For patient/therapist safety;To address functional/ADL transfers   OT goals addressed during session: ADL's and self-care      AM-PAC PT "6 Clicks" Daily Activity     Outcome Measure Help from another person eating meals?: Total Help from another person taking care of personal grooming?: Total Help from another person toileting, which includes using toliet, bedpan, or urinal?: Total Help from another person bathing (including washing, rinsing, drying)?: Total Help from another person to put on and taking off regular upper body clothing?: Total Help from another person to put on and taking off regular lower body clothing?: Total 6 Click Score: 6  End of Session Equipment Utilized During Treatment: Oxygen Nurse Communication: Mobility status  Activity Tolerance: Patient limited by lethargy Patient left: in bed;with call bell/phone within reach;with bed alarm set  OT Visit Diagnosis: Other abnormalities of gait and mobility (R26.89);Muscle weakness (generalized) (M62.81);Low vision, both eyes (H54.2);Other symptoms and signs involving cognitive function                Time: 5732-2567 OT Time  Calculation (min): 23 min Charges:  OT General Charges $OT Visit: 1 Visit OT Evaluation $OT Eval Moderate Complexity: [place], OTR/L [place] Pager [phone] Office [place] A Angelette Ganus 11/06/2017, 12:02 PM

## 2017-11-06 NOTE — Procedures (Signed)
Objective Swallowing Evaluation: Type of Study: FEES-Fiberoptic Endoscopic Evaluation of Swallow   Patient Details  Name: Daniel [name] MRN: [id number] Date of Birth: November 13, 1965  Today's Date: 11/06/2017 Time: SLP Start Time (ACUTE ONLY): 1350 -SLP Stop Time (ACUTE ONLY): 1411  SLP Time Calculation (min) (ACUTE ONLY): 21 min   Past Medical History:  Past Medical History:  Diagnosis Date  . Pneumonia   . Pulmonary embolism ([place])   . Sleep apnea    Past Surgical History: No past surgical history on file. HPI: 52 yo male presented to [facility] with altered mental status, confusion, headaches, hallucinations, and falls.  SBP was over 200.  In ER at [facility] he was started on esmolol gtt.  Developed seizure in [place] at [place].  He continued to have seizures and transferred to [facility].  He was started on burst suppression with versed and required intubation 9/07.  Developed fever and increased sputum 9/10. Extubated 9/13.    No data recorded   Assessment / Plan / Recommendation  CHL IP CLINICAL IMPRESSIONS 11/06/2017  Clinical Impression Pt presents with decreased airway protection due to impaired glottic competence following 7 day intubation with bilateral excrescence seen on vocal fold and subglottic tissue. This, combined with presence of NG tube impeding bolus flow and decreased awareness with delayed swallow intiatiion, put pt at high risk of aspiration before/during the swallow with thin and nectar thick liquids. Pt did sense aspiration with a delayed but effective hard cough. Expect eventual upgrade from honey/dys 2 diet as pt s mentation is consistent and pt demonstrates tolerance of sips of thinner liquids without cough response.   SLP Visit Diagnosis Dysphagia, oropharyngeal phase (R13.12)  Attention and concentration deficit following --  Frontal lobe and executive function deficit following --  Impact on safety and function Moderate aspiration risk      CHL IP TREATMENT  RECOMMENDATION 11/06/2017  Treatment Recommendations Therapy as outlined in treatment plan below     Prognosis 11/06/2017  Prognosis for Safe Diet Advancement Fair  Barriers to Reach Goals --  Barriers/Prognosis Comment --    CHL IP DIET RECOMMENDATION 11/06/2017  SLP Diet Recommendations Dysphagia 2 (Fine chop) solids;Honey thick liquids  Liquid Administration via Cup  Medication Administration Crushed with puree  Compensations Slow rate;Small sips/bites  Postural Changes Remain semi-upright after after feeds/meals (Comment);Seated upright at 90 degrees      CHL IP OTHER RECOMMENDATIONS 11/06/2017  Recommended Consults --  Oral Care Recommendations Oral care BID  Other Recommendations --      CHL IP FOLLOW UP RECOMMENDATIONS 11/06/2017  Follow up Recommendations Skilled Nursing facility      [facility] IP FREQUENCY AND DURATION 11/06/2017  Speech Therapy Frequency (ACUTE ONLY) min 2x/week  Treatment Duration 2 weeks           CHL IP ORAL PHASE 11/06/2017  Oral Phase Impaired  Oral - Pudding Teaspoon --  Oral - Pudding Cup --  Oral - Honey Teaspoon Delayed oral transit;Decreased bolus cohesion;Premature spillage;Weak lingual manipulation  Oral - Honey Cup --  Oral - Nectar Teaspoon Delayed oral transit;Decreased bolus cohesion;Premature spillage;Weak lingual manipulation  Oral - Nectar Cup Delayed oral transit;Decreased bolus cohesion;Premature spillage;Weak lingual manipulation  Oral - Nectar Straw --  Oral - Thin Teaspoon Delayed oral transit;Decreased bolus cohesion;Premature spillage;Weak lingual manipulation  Oral - Thin Cup Delayed oral transit;Decreased bolus cohesion;Premature spillage;Weak lingual manipulation  Oral - Thin Straw --  Oral - Puree Delayed oral transit;Decreased bolus cohesion;Premature spillage;Weak lingual manipulation  Oral -  Mech Soft --  Oral - Regular Delayed oral transit;Decreased bolus cohesion;Premature spillage;Weak lingual manipulation  Oral -  Multi-Consistency --  Oral - Pill --  Oral Phase - Comment --    CHL IP PHARYNGEAL PHASE 11/06/2017  Pharyngeal Phase Impaired  Pharyngeal- Pudding Teaspoon --  Pharyngeal --  Pharyngeal- Pudding Cup --  Pharyngeal --  Pharyngeal- Honey Teaspoon Delayed swallow initiation-pyriform sinuses;Pharyngeal residue - cp segment  Pharyngeal --  Pharyngeal- Honey Cup Delayed swallow initiation-pyriform sinuses;Pharyngeal residue - cp segment  Pharyngeal --  Pharyngeal- Nectar Teaspoon Penetration/Aspiration before swallow;Delayed swallow initiation-pyriform sinuses  Pharyngeal Material enters airway, CONTACTS cords and then ejected out  Pharyngeal- Nectar Cup Penetration/Aspiration before swallow;Delayed swallow initiation-pyriform sinuses;Trace aspiration  Pharyngeal Material enters airway, passes BELOW cords then ejected out  Pharyngeal- Nectar Straw --  Pharyngeal --  Pharyngeal- Thin Teaspoon Penetration/Aspiration before swallow;Delayed swallow initiation-pyriform sinuses;Trace aspiration  Pharyngeal Material enters airway, passes BELOW cords then ejected out  Pharyngeal- Thin Cup Penetration/Aspiration before swallow;Delayed swallow initiation-pyriform sinuses;Trace aspiration  Pharyngeal Material enters airway, passes BELOW cords then ejected out  Pharyngeal- Thin Straw --  Pharyngeal --  Pharyngeal- Puree Delayed swallow initiation-vallecula  Pharyngeal --  Pharyngeal- Mechanical Soft Delayed swallow initiation-vallecula  Pharyngeal --  Pharyngeal- Regular --  Pharyngeal --  Pharyngeal- Multi-consistency --  Pharyngeal --  Pharyngeal- Pill --  Pharyngeal --  Pharyngeal Comment --     No flowsheet data found.  Herbie Baltimore, [place] CCC-SLP [phone]  Lynann Beaver 11/06/2017, 2:27 PM

## 2017-11-06 NOTE — Progress Notes (Signed)
  Speech Language Pathology Treatment: Dysphagia  Patient Details Name: Daniel [name] MRN: [id number] DOB: [birth year] Today's Date: 11/06/2017 Time: 1552-0802 SLP Time Calculation (min) (ACUTE ONLY): 15 min  Assessment / Plan / Recommendation Clinical Impression  Pt seen at bedside to determine readiness for FEES objective test. With max interventions pt able to wake up, follow commands, take straw sips. Pt still severely dysphonic, tongue quite edematous and stiff. Sips of water followed by immediate coughing. Dysphagia is likely still moderate to severe, but perhaps pt could initiate a modified diet at this point given that he was requesting PO the day before per RN.   HPI HPI: 51 yo male presented to [place] with altered mental status, confusion, headaches, hallucinations, and falls.  SBP was over 200.  In ER at [facility] he was started on esmolol gtt.  Developed seizure in [place] at [place].  He continued to have seizures and transferred to [facility].  He was started on burst suppression with versed and required intubation 9/07.  Developed fever and increased sputum 9/10. Extubated 9/13.       SLP Plan  Other (Comment)(FEES)       Recommendations  Diet recommendations: NPO                Follow up Recommendations: Other (comment) SLP Visit Diagnosis: Dysphagia, oropharyngeal phase (R13.12) Plan: Other (Comment)(FEES)       GO               Herbie Baltimore, MA CCC-SLP [phone]  Lynann Beaver 11/06/2017, 11:27 AM

## 2017-11-06 NOTE — Significant Event (Addendum)
Rapid Response Event Note  Overview: Neurologic - AMS  Initial Focused Assessment: Called by RN and RT about patient being unresponsive and hard to arouse. Per RN and RT, patient has been thrashing his head back in from side to side. When I arrived, patient did respond briefly to noxious stimuli, at times would open his eyes, intermittently follows commands in BLE. Pupils were reactive bilaterally, his family was present, described that patient might have had another seizure. Lung sounds - clear in the uppers, diminished in the lowers R > L, VSS except fever of 101.3, very warm to touch, diaphoretic. Blood sugar was 274 and treated with SS. 5L [place], not on nightly CPAP, d/t mental status changes.   TRH NP called at 2037 and arrived immediately to the bedside.   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 - Bladder scan (foley removed earlier, has not voided) - VAST RN consult place for help with establishing another PIV - APAP IV  Plan of Care: - Follow up CT/labs - SDU level of care - Monitor neurologic assessment, placed on continuous pulse ox was well given neuro and respiratory conditions.   Event Summary:    at    Call Time 2021 Arrival Time 2025 End Time 2200  Schylar Wuebker R

## 2017-11-06 NOTE — Progress Notes (Signed)
LUE venous duplex prelim: negative for DVT. Superficial thrombosis noted in the left cephalic vein, short segment partial thrombus.  Landry Mellow, RDMS, RVT

## 2017-11-06 NOTE — Progress Notes (Signed)
PROGRESS NOTE    Daniel [name]  SPQ:[id number] DOB: 11/02/1965 DOA: 10/27/2017 PCP: Antonietta Jewel, MD   Brief Narrative:  52 yo male with prior h/o IDDM, HTN, OSA, OHS, Seizures, PE presented to [facility] hospital with altered mental status, confusion, headaches, hallucinations, and falls. SBP was over 200. In ER at [facility] he was started on esmolol gtt. Developed seizure in [place] at [place]. He continued to have seizures and transferred to [facility]. He was  intubated 9/07 and admitted to [facility] service. 9/14 - deemed severe aspiration risk post extubation. Family says he uses CPAP at night.  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 PT/OT Speech evaluation planning for FEES today.    Acute respiratory failure with hypoxia sec to OSA, OHS, CAP from haemophilus influenza.  - resume IV rocephin to complete the course. , [place] oxygen tokeep sats greater than 90%.  Repeat CXR today shows Patchy density at both lung bases consistent with atelectasis or mild pneumonia.   Type 2 DM:  CBG (last 3)  Recent Labs    11/05/17 2021 11/06/17 0103 11/06/17 0406  GLUCAP 186* 189* 201*   Resume SSI. No change in meds.    Diarrhea:   c diff pcr negative. Pt has a rectal tube.  Plan to take it out, once diarrhea improves.    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 14 now its 1.79.  Adequate urine output. 2.2 lit overnight.  Held the lasix and recheck rnal parameters in am.    Hypernatremia:  Free water boluses started today. 223ml every 8 hours. If it doesn't improve, will start him on slow rate dextrose fluids.    Hypothyroidism:  Resume synthroid.    Hypokalemia  Replaced and repeat level wnl.  Magnesium level wnl.    Fever this am:  Unclear etiology. UA shows moderate leukocytosis, with rare bacteria and >50 wbc, .  CXR shows improving pneumonia.  His left upper extremity is swollen, will get venous duplex of the left upper extremity to rule out DVT.  Get blood cultures and urine cultures.     DVT prophylaxis: sq heparin Code Status: (full code. ) Family Communication: none at bedside.  Disposition Plan: pending clinical improvement.    Consultants:   None.    Procedures:  9/07 Admit and intubated. ETT 9/07 >> Lt IJ CVL 9/07 >>  EEG 9/07 >> focal non convulsive status epilepticus from Rt occipital region LP 9/08 >> RBC 99, WBC 4, glucose 104, protein 81 Sputum cultures from 9/10  grew haemophilus influenzae.  9/13 Extubate C diff PCR 9/12 >>negative   Antimicrobials:rocephin since 9/12.    Subjective: Febrile this am, is more alert today than yesterday.  Able to follow simple commands.  Objective: Vitals:   11/06/17 0400 11/06/17 0500 11/06/17 0600 11/06/17 0927  BP: (!) 160/93  (!) 179/99 139/89  Pulse:   92 92  Resp: (!) 22  (!) 22 16  Temp: (!) 100.5 F (38.1 C)  (!) 102.3 F (39.1 C) (!) 101.1 F (38.4 C)  TempSrc: Axillary  Axillary Oral  SpO2: 97%  94% 95%  Weight:  (!) 136.3 kg    Height:        Intake/Output Summary (  Last 24 hours) at 11/06/2017 0933 Last data filed at 11/05/2017 2305 Gross per 24 hour  Intake 520.64 ml  Output 2085 ml  Net -1564.36 ml   Filed Weights   11/04/17 0445 11/05/17 0400 11/06/17 0500  Weight: 134.3 kg 133.5 kg (!) 136.3 kg    Examination:  General exam: more alert today and is still on 2 lit of [place] oxygen with cor track in the nostril Respiratory system: diminished at bases, no wheezing or rhonchi.  Cardiovascular system: S1 & S2 heard, RRR. Pedal edema present.  Gastrointestinal system: Abdomen is nondistended,soft , non tender , bowel sounds okay.  Central nervous system: more alert that yesterday and able to simple  questions.  Extremities: 2+ edema of the upper extremities left more than the right. And 1 edema of the lower extremities.  Skin: No rashes, lesions or ulcers Psychiatry: cannot be assessed at this time as pt is confused.     Data Reviewed: I have personally reviewed following labs and imaging studies  CBC: Recent Labs  Lab 11/02/17 0500 11/03/17 0500 11/04/17 0448 11/05/17 0017 11/06/17 0452  WBC 8.7 9.3 9.8 9.7 8.4  NEUTROABS  --   --   --  6.3 4.6  HGB 12.0* 13.3 13.9 13.7 14.2  HCT 40.4 44.1 46.1 45.8 48.9  MCV 94.0 91.9 92.2 92.5 94.4  PLT 274 327 345 377 176   Basic Metabolic Panel: Recent Labs  Lab 10/31/17 1812 11/01/17 0503 11/01/17 1700 11/02/17 0500 11/03/17 0500 11/04/17 0448 11/05/17 0017 11/06/17 0452  NA  --  142  --  146* 149* 153* 157* 157*  K  --  3.8  --  4.2 3.1* 3.0* 3.3* 3.5  CL  --  111  --  114* 114* 117* 120* 120*  CO2  --  21*  --  23 24 27 24 25   GLUCOSE  --  234*  --  241* 242* 181* 231* 209*  BUN  --  55*  --  66* 55* 46* 51* 47*  CREATININE  --  2.14*  --  2.00* 1.57* 1.28* 1.60* 1.79*  CALCIUM  --  7.8*  --  8.3* 8.9 9.2 9.1 8.7*  MG 2.0 2.1 2.0  --   --  2.2 2.1 2.3  PHOS 4.2 4.8* 4.6  --   --   --  3.5 4.1   GFR: Estimated Creatinine Clearance: 65.4 mL/min (A) (by C-G formula based on SCr of 1.79 mg/dL (H)). Liver Function Tests: Recent Labs  Lab 10/31/17 0547 11/05/17 0017  AST 17 20  ALT 18 17  ALKPHOS 61 65  BILITOT 0.5 1.0  PROT 5.4* 6.2*  ALBUMIN 2.1* 2.3*   No results for input(s): LIPASE, AMYLASE in the last 168 hours. No results for input(s): AMMONIA in the last 168 hours. Coagulation Profile: No results for input(s): INR, PROTIME in the last 168 hours. Cardiac Enzymes: No results for input(s): CKTOTAL, CKMB, CKMBINDEX, TROPONINI in the last 168 hours. BNP (last 3 results) No results for input(s): PROBNP in the last 8760 hours. HbA1C: No results for input(s): HGBA1C in the last 72 hours. CBG: Recent Labs    Lab 11/05/17 1126 11/05/17 1541 11/05/17 2021 11/06/17 0103 11/06/17 0406  GLUCAP 216* 189* 186* 189* 201*   Lipid Profile: No results for input(s): CHOL, HDL, LDLCALC, TRIG, CHOLHDL, LDLDIRECT in the last 72 hours. Thyroid Function Tests: No results for input(s): TSH, T4TOTAL, FREET4, T3FREE, THYROIDAB in the last 72 hours. Anemia Panel: No results for  input(s): VITAMINB12, FOLATE, FERRITIN, TIBC, IRON, RETICCTPCT in the last 72 hours. Sepsis Labs: Recent Labs  Lab 10/31/17 0905 11/01/17 0503 11/02/17 0500 11/05/17 0017  PROCALCITON 0.34 0.54 0.49  --   LATICACIDVEN  --   --   --  1.1    Recent Results (from the past 240 hour(s))  MRSA PCR Screening     Status: None   Collection Time: 10/28/17 10:55 AM  Result Value Ref Range Status   MRSA by PCR NEGATIVE NEGATIVE Final    Comment:        The GeneXpert MRSA Assay (FDA approved for NASAL specimens only), is one component of a comprehensive MRSA colonization surveillance program. It is not intended to diagnose MRSA infection nor to guide or monitor treatment for MRSA infections. Performed at [facility], [place] [address], [place], [place] [address]   CSF culture     Status: None   Collection Time: 10/28/17  1:46 PM  Result Value Ref Range Status   Specimen Description CSF  Final   Special Requests NONE  Final   Gram Stain   Final    WBC PRESENT, PREDOMINANTLY MONONUCLEAR NO ORGANISMS SEEN CYTOSPIN SMEAR    Culture   Final    NO GROWTH 3 DAYS Performed at [facility], [place] [address] [place], [place] [address]    Report Status 10/31/2017 FINAL  Final  Culture, respiratory (non-expectorated)     Status: None   Collection Time: 10/31/17  9:01 AM  Result Value Ref Range Status   Specimen Description TRACHEAL ASPIRATE  Final   Special Requests NONE  Final   Gram Stain   Final    MODERATE WBC PRESENT, PREDOMINANTLY PMN FEW GRAM NEGATIVE RODS RARE GRAM POSITIVE RODS    Culture   Final     MODERATE HAEMOPHILUS INFLUENZAE BETA LACTAMASE NEGATIVE Performed at [facility], [address] [place], [place] [address]    Report Status 11/01/2017 FINAL  Final  Culture, blood (Routine X 2) w Reflex to ID Panel     Status: None   Collection Time: 10/31/17 11:04 AM  Result Value Ref Range Status   Specimen Description BLOOD BLOOD RIGHT HAND  Final   Special Requests   Final    BOTTLES DRAWN AEROBIC ONLY Blood Culture adequate volume   Culture   Final    NO GROWTH 5 DAYS Performed at [facility], [place] [address], [place], [place] [address]    Report Status 11/05/2017 FINAL  Final  Culture, blood (Routine X 2) w Reflex to ID Panel     Status: None   Collection Time: 10/31/17 11:08 AM  Result Value Ref Range Status   Specimen Description BLOOD BLOOD RIGHT HAND  Final   Special Requests   Final    BOTTLES DRAWN AEROBIC ONLY Blood Culture results may not be optimal due to an inadequate volume of blood received in culture bottles   Culture   Final    NO GROWTH 5 DAYS Performed at [facility], [place] [address] [place],  [address]    Report Status 11/05/2017 FINAL  Final  C difficile quick scan w PCR reflex     Status: None   Collection Time: 11/02/17  8:30 AM  Result Value Ref Range Status   C Diff antigen NEGATIVE NEGATIVE Final   C Diff toxin NEGATIVE NEGATIVE Final   C Diff interpretation No C. difficile detected.  Final  Comment: Performed at [facility], [place] [address] [place], [place] [address]         Radiology Studies: Dg Chest Port 1 View  Result Date: 11/05/2017 CLINICAL DATA:  Fever EXAM: PORTABLE CHEST 1 VIEW COMPARISON:  11/03/2017 FINDINGS: Heart size is normal. Soft feeding tube enters the abdomen. Mild patchy atelectasis and or infiltrate at both lung bases. No dense consolidation or lobar collapse. No edema or effusions. IMPRESSION: Patchy density at both lung bases consistent with atelectasis or mild pneumonia.  Electronically Signed   By: Nelson Chimes M.D.   On: 11/05/2017 13:49        Scheduled Meds: . aspirin  81 mg Oral Daily  . budesonide (PULMICORT) nebulizer solution  0.25 mg Nebulization BID  . chlorhexidine  15 mL Mouth Rinse BID  . free water  200 mL Per Tube Q8H  . heparin injection (subcutaneous)  5,000 Units Subcutaneous Q8H  . hydrALAZINE  25 mg Oral Q6H  . insulin aspart  0-20 Units Subcutaneous Q4H  . ipratropium-albuterol  3 mL Nebulization TID  . levothyroxine  50 mcg Oral QAC breakfast  . mouth rinse  15 mL Mouth Rinse q12n4p  . metoprolol tartrate  5 mg Intravenous Q6H  . pantoprazole sodium  40 mg Per Tube Q24H  . pneumococcal 23 valent vaccine  0.5 mL Intramuscular Tomorrow-1000   Continuous Infusions: . sodium chloride Stopped (11/04/17 1603)  . cefTRIAXone (ROCEPHIN)  IV 1 g (11/06/17 0927)  . dextrose 10 mL/hr at 11/05/17 2116  . lacosamide (VIMPAT) IV 200 mg (11/05/17 2240)  . levETIRAcetam 1,000 mg (11/05/17 2141)  . phenytoin (DILANTIN) IV 135 mg (11/06/17 0528)     LOS: 10 days    Time spent: 35 minutes.     Hosie Poisson, MD [facility] Pager [phone]  If 7PM-7AM, please contact night-coverage [email or web address] Password [facility] 11/06/2017, 9:33 AM

## 2017-11-06 NOTE — Progress Notes (Signed)
Nutrition Follow-up  DOCUMENTATION CODES:   Morbid obesity  INTERVENTION:  Provide Magic cup TID with meals, each supplement provides 290 kcal and 9 grams of protein.  Continue 200 ml free water flushes every 8 hours via Cortrak NGT. MD to adjust as appropriate.   Encourage adequate PO intake.   NUTRITION DIAGNOSIS:   Inadequate oral intake related to inability to eat as evidenced by NPO status; diet advanced; improving  GOAL:   Patient will meet greater than or equal to 90% of their needs; progressing  MONITOR:   PO intake, Supplement acceptance, Diet advancement, Weight trends, Labs, Skin, I & O's  REASON FOR ASSESSMENT:   Consult Enteral/tube feeding initiation and management  ASSESSMENT:   52 yo male with PMH of IDDM, HTN, OSA, OHS, and seizures who was admitted to [facility] on 9/6 with AMS. Had multiple seizures and was transferred to [facility]; required intubation 9/7.  Pt extubated 9/13. Pt underwent FEES today. Diet has been advanced to a dysphagia 2 diet with honey thick liquids. Cortrak NGT remains in placed. Pt receiving free water flushes via NGT. MD to adjust as appropriate. Magic cup TID to be provided at meals. RD to continue to monitor.   Labs and medications reviewed. Sodium elevated at 157. Chloride elevated at 120.   Diet Order:   Diet Order            DIET DYS 2 Room service appropriate? Yes; Fluid consistency: Honey Thick  Diet effective now              EDUCATION NEEDS:   No education needs have been identified at this time  Skin:  Skin Assessment: Reviewed RN Assessment  Last BM:  9/16  Height:   Ht Readings from Last 1 Encounters:  10/28/17 5\' 9"  (1.753 m)    Weight:   Wt Readings from Last 1 Encounters:  11/06/17 (!) 136.3 kg    Ideal Body Weight:  72.7 kg  BMI:  Body mass index is 44.37 kg/m.  Estimated Nutritional Needs:   Kcal:  2000-2200  Protein:  110-120 grams  Fluid:  2- 2.2 L/day    Corrin Parker,  MS, RD, LDN Pager # [phone] After hours/ weekend pager # [phone]

## 2017-11-07 ENCOUNTER — Encounter (HOSPITAL_COMMUNITY): Payer: Self-pay | Admitting: Nephrology

## 2017-11-07 ENCOUNTER — Inpatient Hospital Stay (HOSPITAL_COMMUNITY): Payer: Medicaid Other

## 2017-11-07 DIAGNOSIS — N189 Chronic kidney disease, unspecified: Secondary | ICD-10-CM

## 2017-11-07 DIAGNOSIS — J1100 Influenza due to unidentified influenza virus with unspecified type of pneumonia: Secondary | ICD-10-CM

## 2017-11-07 DIAGNOSIS — R569 Unspecified convulsions: Secondary | ICD-10-CM

## 2017-11-07 DIAGNOSIS — E662 Morbid (severe) obesity with alveolar hypoventilation: Secondary | ICD-10-CM

## 2017-11-07 DIAGNOSIS — Z86711 Personal history of pulmonary embolism: Secondary | ICD-10-CM

## 2017-11-07 DIAGNOSIS — E870 Hyperosmolality and hypernatremia: Secondary | ICD-10-CM

## 2017-11-07 DIAGNOSIS — Z87891 Personal history of nicotine dependence: Secondary | ICD-10-CM

## 2017-11-07 DIAGNOSIS — Z888 Allergy status to other drugs, medicaments and biological substances status: Secondary | ICD-10-CM

## 2017-11-07 DIAGNOSIS — K0889 Other specified disorders of teeth and supporting structures: Secondary | ICD-10-CM

## 2017-11-07 DIAGNOSIS — Z9181 History of falling: Secondary | ICD-10-CM

## 2017-11-07 DIAGNOSIS — R509 Fever, unspecified: Secondary | ICD-10-CM

## 2017-11-07 DIAGNOSIS — E1122 Type 2 diabetes mellitus with diabetic chronic kidney disease: Secondary | ICD-10-CM

## 2017-11-07 LAB — CBC WITH DIFFERENTIAL/PLATELET
ABS IMMATURE GRANULOCYTES: 0.1 10*3/uL (ref 0.0–0.1)
BASOS PCT: 1 %
Basophils Absolute: 0.1 10*3/uL (ref 0.0–0.1)
Eosinophils Absolute: 0.3 10*3/uL (ref 0.0–0.7)
Eosinophils Relative: 3 %
HEMATOCRIT: 48.1 % (ref 39.0–52.0)
Hemoglobin: 13.7 g/dL (ref 13.0–17.0)
IMMATURE GRANULOCYTES: 1 %
LYMPHS PCT: 25 %
Lymphs Abs: 2.7 10*3/uL (ref 0.7–4.0)
MCH: 27.6 pg (ref 26.0–34.0)
MCHC: 28.5 g/dL — ABNORMAL LOW (ref 30.0–36.0)
MCV: 96.8 fL (ref 78.0–100.0)
MONO ABS: 0.7 10*3/uL (ref 0.1–1.0)
MONOS PCT: 7 %
NEUTROS ABS: 7 10*3/uL (ref 1.7–7.7)
NEUTROS PCT: 63 %
Platelets: 226 10*3/uL (ref 150–400)
RBC: 4.97 MIL/uL (ref 4.22–5.81)
RDW: 14.4 % (ref 11.5–15.5)
WBC: 11 10*3/uL — ABNORMAL HIGH (ref 4.0–10.5)

## 2017-11-07 LAB — GLUCOSE, CAPILLARY
GLUCOSE-CAPILLARY: 173 mg/dL — AB (ref 70–99)
GLUCOSE-CAPILLARY: 160 mg/dL — AB (ref 70–99)
Glucose-Capillary: 146 mg/dL — ABNORMAL HIGH (ref 70–99)
Glucose-Capillary: 160 mg/dL — ABNORMAL HIGH (ref 70–99)
Glucose-Capillary: 160 mg/dL — ABNORMAL HIGH (ref 70–99)
Glucose-Capillary: 172 mg/dL — ABNORMAL HIGH (ref 70–99)

## 2017-11-07 LAB — URINALYSIS, COMPLETE (UACMP) WITH MICROSCOPIC
Bilirubin Urine: NEGATIVE
GLUCOSE, UA: 50 mg/dL — AB
Ketones, ur: NEGATIVE mg/dL
Leukocytes, UA: NEGATIVE
Nitrite: NEGATIVE
PH: 5 (ref 5.0–8.0)
Protein, ur: 100 mg/dL — AB
Specific Gravity, Urine: 1.02 (ref 1.005–1.030)

## 2017-11-07 LAB — BASIC METABOLIC PANEL
ANION GAP: 15 (ref 5–15)
BUN: 50 mg/dL — ABNORMAL HIGH (ref 6–20)
CHLORIDE: 120 mmol/L — AB (ref 98–111)
CO2: 24 mmol/L (ref 22–32)
Calcium: 8.6 mg/dL — ABNORMAL LOW (ref 8.9–10.3)
Creatinine, Ser: 2.26 mg/dL — ABNORMAL HIGH (ref 0.61–1.24)
GFR calc Af Amer: 36 mL/min — ABNORMAL LOW (ref >60)
GFR calc non Af Amer: 31 mL/min — ABNORMAL LOW (ref >60)
GLUCOSE: 201 mg/dL — AB (ref 70–99)
POTASSIUM: 4.3 mmol/L (ref 3.5–5.1)
Sodium: 159 mmol/L — ABNORMAL HIGH (ref 135–145)

## 2017-11-07 LAB — PHOSPHORUS: Phosphorus: 5.5 mg/dL — ABNORMAL HIGH (ref 2.5–4.6)

## 2017-11-07 LAB — PHENYTOIN LEVEL, TOTAL: Phenytoin Lvl: 18 ug/mL (ref 10.0–20.0)

## 2017-11-07 LAB — MAGNESIUM: MAGNESIUM: 2.1 mg/dL (ref 1.7–2.4)

## 2017-11-07 LAB — CK: Total CK: 125 U/L (ref 49–397)

## 2017-11-07 LAB — TSH: TSH: 1.415 u[IU]/mL (ref 0.350–4.500)

## 2017-11-07 LAB — CREATININE, URINE, RANDOM: Creatinine, Urine: 153.97 mg/dL

## 2017-11-07 LAB — SODIUM, URINE, RANDOM: Sodium, Ur: 52 mmol/L

## 2017-11-07 LAB — AMMONIA: AMMONIA: 37 umol/L — AB (ref 9–35)

## 2017-11-07 LAB — CORTISOL: CORTISOL PLASMA: 20.4 ug/dL

## 2017-11-07 MED ORDER — SODIUM CHLORIDE 0.9 % IV SOLN
1500.0000 mg | Freq: Once | INTRAVENOUS | Status: AC
  Administered 2017-11-07: 1500 mg via INTRAVENOUS
  Filled 2017-11-07: qty 30

## 2017-11-07 MED ORDER — SODIUM CHLORIDE 0.45 % IV SOLN
INTRAVENOUS | Status: DC
  Administered 2017-11-07 – 2017-11-11 (×8): via INTRAVENOUS

## 2017-11-07 MED ORDER — LEVOTHYROXINE SODIUM 100 MCG IV SOLR
25.0000 ug | Freq: Every day | INTRAVENOUS | Status: DC
  Administered 2017-11-07: 25 ug via INTRAVENOUS
  Filled 2017-11-07 (×2): qty 5

## 2017-11-07 MED ORDER — SODIUM CHLORIDE 0.9 % IV SOLN
150.0000 mg | Freq: Three times a day (TID) | INTRAVENOUS | Status: DC
  Administered 2017-11-07: 150 mg via INTRAVENOUS
  Filled 2017-11-07 (×2): qty 3

## 2017-11-07 MED ORDER — IPRATROPIUM-ALBUTEROL 0.5-2.5 (3) MG/3ML IN SOLN
3.0000 mL | Freq: Two times a day (BID) | RESPIRATORY_TRACT | Status: DC
  Administered 2017-11-08 – 2017-11-11 (×6): 3 mL via RESPIRATORY_TRACT
  Filled 2017-11-07 (×7): qty 3

## 2017-11-07 MED ORDER — SODIUM CHLORIDE 0.9 % IV SOLN
175.0000 mg | Freq: Three times a day (TID) | INTRAVENOUS | Status: DC
  Administered 2017-11-07 – 2017-11-15 (×24): 175 mg via INTRAVENOUS
  Filled 2017-11-07 (×30): qty 3.5

## 2017-11-07 NOTE — Consult Note (Signed)
Reason for Consult: AKI/CKD stage 3 Referring Physician: Karleen Hampshire, MD  Daniel [name] is an 52 y.o. male.  HPI: Mr. Daniel [name] is a 53 yo WM with PMH significant for DM, HTN, combined systolic and diastolic CHF (diagnosed 2/40 at [facility] and non-obstructive CAD by cardiac cath), morbid obesity, OSA, hypothyroidism, and CKD stage 3 (baseline Scr 1.4-1.8) who presented to [name] with a 4 week history of progressive encephalopathy and hallucinations.  He had a markedly elevated blood pressure on admission and EEG captured epileptic seizure activity.  He was started on Keppra and Vimpat and contintued to have episodes and was transferred to [facility] on 10/27/17 for continuous EEG monitoring.  Neuro has been following and suspect hyperglycemic related seizures.  He has had progressive mental status decline over the last 24 hours and has not been able to take anything by mouth since admission.  We were consulted to evaluate the development of AKI/CKD stage 3 over the last 4 days.  The trend in Scr is seen below.  He has been diuresed over 11 liters since admission and his sodium has also been rising over the last 5 days.  He has been on lactated ringers but not taking anything po.  He is currently lying in bed with his right eye open but not following commands nor is he verbal.  Of note, he did have an episode of AKI/CKD in July 2018 when he was admitted to [facility] for acute CHF while on lisinopril which was held and his Cr peaked at 2.08 and improved to 1.4 at time of discharge.    Trend in Creatinine: Creatinine, Ser  Date/Time Value Ref Range Status  11/07/2017 06:26 AM 2.26 (H) 0.61 - 1.24 mg/dL Final  11/06/2017 09:49 PM 1.98 (H) 0.61 - 1.24 mg/dL Final  11/06/2017 04:52 AM 1.79 (H) 0.61 - 1.24 mg/dL Final  11/05/2017 12:17 AM 1.60 (H) 0.61 - 1.24 mg/dL Final  11/04/2017 04:48 AM 1.28 (H) 0.61 - 1.24 mg/dL Final  11/03/2017 05:00 AM 1.57 (H) 0.61 - 1.24 mg/dL Final  11/02/2017 05:00 AM 2.00 (H) 0.61 -  1.24 mg/dL Final  11/01/2017 05:03 AM 2.14 (H) 0.61 - 1.24 mg/dL Final  10/31/2017 05:47 AM 2.00 (H) 0.61 - 1.24 mg/dL Final  10/30/2017 05:30 AM 1.83 (H) 0.61 - 1.24 mg/dL Final  10/29/2017 04:09 AM 1.92 (H) 0.61 - 1.24 mg/dL Final  10/28/2017 11:13 AM 1.44 (H) 0.61 - 1.24 mg/dL Final  10/27/2017 03:57 PM 1.59 (H) 0.61 - 1.24 mg/dL Final    PMH:   Past Medical History:  Diagnosis Date  . CHF (congestive heart failure) ([place]) 08/2016  . CKD stage 3 due to type 2 diabetes mellitus ([place]) 06/2016  . Diabetes mellitus type 2 in obese ([place])   . Morbid obesity ([place])   . Pneumonia   . Pulmonary embolism ([place])   . Sleep apnea     PSH:  No past surgical history on file.  Allergies:  Allergies  Allergen Reactions  . Montelukast Other (See Comments)    Possibly cause headaches per spouse    Medications:   Prior to Admission medications   Medication Sig Start Date End Date Taking? Authorizing Provider  albuterol (PROVENTIL HFA) 108 (90 Base) MCG/ACT inhaler Inhale 1 puff into the lungs every 6 (six) hours as needed for wheezing or shortness of breath.    Yes [provider]  aspirin EC 81 MG tablet Take 81 mg by mouth daily.    Yes [provider]  fluticasone furoate-vilanterol (BREO ELLIPTA) 200-25 MCG/INH AEPB Inhale 1 puff into the lungs daily.  05/05/17  Yes [provider]  hydrALAZINE (APRESOLINE) 100 MG tablet Take 100 mg by mouth 3 (three) times daily.  10/04/16  Yes [provider]  HYDROcodone-acetaminophen (NORCO/VICODIN) 5-325 MG tablet Take 1 tablet by mouth every 8 (eight) hours as needed (pain).  08/30/17  Yes [provider]  Insulin Detemir (LEVEMIR FLEXTOUCH) 100 UNIT/ML Pen Inject 120 Units into the skin daily.   Yes [provider]  levothyroxine (SYNTHROID, LEVOTHROID) 50 MCG tablet Take 50 mcg by mouth daily.  12/22/16  Yes [provider]  lisinopril (PRINIVIL,ZESTRIL) 10 MG tablet Take 10 mg by mouth daily.  07/01/17  Yes [provider]  metFORMIN (GLUCOPHAGE) 500 MG tablet Take 500 mg by mouth 2 (two) times daily with a meal.  07/20/17  Yes [provider]  omeprazole (PRILOSEC) 20 MG capsule Take 20 mg by mouth daily as needed (acid reflux/heartburn).  08/30/17  Yes [provider]  torsemide (DEMADEX) 20 MG tablet Take 20 mg by mouth daily.  11/29/16  Yes [provider]    Inpatient medications: . aspirin  81 mg Oral Daily  . budesonide (PULMICORT) nebulizer solution  0.25 mg Nebulization BID  . chlorhexidine  15 mL Mouth Rinse BID  . free water  200 mL Per Tube Q8H  . heparin injection (subcutaneous)  5,000 Units Subcutaneous Q8H  . insulin aspart  0-20 Units Subcutaneous Q4H  . ipratropium-albuterol  3 mL Nebulization TID  . levothyroxine  25 mcg Intravenous Daily  . mouth rinse  15 mL Mouth Rinse q12n4p  . metoprolol tartrate  5 mg Intravenous Q6H  . pantoprazole sodium  40 mg Per Tube Q24H    Discontinued Meds:   Medications Discontinued During This Encounter  Medication Reason  . hydrALAZINE (APRESOLINE) tablet 100 mg   . insulin aspart (novoLOG) injection 60 Units   . insulin glargine (LANTUS) injection 100 Units   . diltiazem (CARDIZEM CD) 24 hr capsule 240 mg   . Armodafinil 250 mg   . lacosamide (VIMPAT) 200 mg in sodium chloride 0.9 % 25 mL IVPB   . insulin aspart protamine- aspart (NOVOLOG MIX 70/30) injection 50 Units   . enoxaparin (LOVENOX) injection 40 mg   . fluticasone furoate-vilanterol (BREO ELLIPTA) 100-25 MCG/INH 1 puff   . midazolam (VERSED) 50 mg in sodium chloride 0.9 % 50 mL (1 mg/mL) infusion   . midazolam (VERSED) bolus via infusion 10 mg   . midazolam (VERSED) 250 mg in sodium chloride 0.9 % 250 mL (1 mg/mL) infusion   . insulin aspart (novoLOG) injection 0-5 Units Discontinued by provider  . insulin aspart (novoLOG) injection 0-20 Units   . midazolam (VERSED) bolus via infusion 10 mg   . midazolam (VERSED) bolus via  infusion 20 mg   . midazolam (VERSED) 250 mg in sodium chloride 0.9 % 250 mL (1 mg/mL) infusion   . midazolam (VERSED) 250 mg in sodium chloride 0.9 % 250 mL (1 mg/mL) infusion   . Armodafinil 250 MG tablet Patient Preference  . cycloSPORINE (RESTASIS) 0.05 % ophthalmic emulsion Patient Preference  . insulin regular (HUMULIN R) 100 units/mL injection Change in therapy  . hydrochlorothiazide (HYDRODIURIL) 25 MG tablet Discontinued by provider  . modafinil (PROVIGIL) tablet 200 mg Entry Error  . aspirin EC tablet 81 mg   . 0.9 %  sodium chloride infusion   . 0.9 %  sodium chloride infusion   .  enoxaparin (LOVENOX) injection 40 mg   . sterile water (preservative free) injection Returned to ADS  . fluticasone furoate-vilanterol (BREO ELLIPTA) 100-25 MCG/INH 1 puff   . phenylephrine (NEOSYNEPHRINE) 10-0.9 MG/250ML-% infusion   . famotidine (PEPCID) IVPB 20 mg premix   . midazolam (VERSED) 250 mg in sodium chloride 0.9 % 250 mL (1 mg/mL) infusion   . montelukast (SINGULAIR) tablet 10 mg   . torsemide (DEMADEX) tablet 20 mg   . CINNAMON PO   . clindamycin (CLEOCIN) 150 MG capsule   . propofol (DIPRIVAN) 1000 MG/100ML infusion   . feeding supplement (PRO-STAT SUGAR FREE 64) liquid 30 mL   . feeding supplement (VITAL HIGH PROTEIN) liquid 1,000 mL   . feeding supplement (PRO-STAT SUGAR FREE 64) liquid 60 mL   . feeding supplement (VITAL HIGH PROTEIN) liquid 1,000 mL   . feeding supplement (PRO-STAT SUGAR FREE 64) liquid 30 mL   . feeding supplement (VITAL HIGH PROTEIN) liquid 1,000 mL   . lisinopril (PRINIVIL,ZESTRIL) tablet 10 mg   . phenytoin (DILANTIN) injection 100 mg   . piperacillin-tazobactam (ZOSYN) IVPB 3.375 g   . torsemide (DEMADEX) tablet 20 mg   . docusate (COLACE) 50 MG/5ML liquid 100 mg   . vancomycin (VANCOCIN) 1,750 mg in sodium chloride 0.9 % 500 mL IVPB   . insulin detemir (LEVEMIR) injection 10 Units   . insulin aspart (novoLOG) injection 5-85 Units Duplicate  . insulin  detemir (LEVEMIR) injection 14 Units Duplicate  . fentaNYL (SUBLIMAZE) injection 100 mcg   . propofol (DIPRIVAN) 1000 MG/100ML infusion   . midazolam (VERSED) injection 2 mg   . feeding supplement (PRO-STAT SUGAR FREE 64) liquid 60 mL   . feeding supplement (VITAL HIGH PROTEIN) liquid 1,000 mL   . aspirin chewable tablet 81 mg   . metoprolol tartrate (LOPRESSOR) 25 mg/10 mL oral suspension 25 mg   . montelukast (SINGULAIR) tablet 10 mg   . furosemide (LASIX) injection 40 mg   . chlorhexidine gluconate (MEDLINE KIT) (PERIDEX) 0.12 % solution 15 mL   . MEDLINE mouth rinse   . hydrALAZINE (APRESOLINE) injection 5 mg   . acetaminophen (TYLENOL) solution 650 mg   . metoprolol tartrate (LOPRESSOR) 25 mg/10 mL oral suspension 25 mg   . Chlorhexidine Gluconate Cloth 2 % PADS 6 each   . 0.45 % sodium chloride infusion   . insulin regular (NOVOLIN R,HUMULIN R) 100 Units in sodium chloride 0.9 % 100 mL (1 Units/mL) infusion   . montelukast (SINGULAIR) tablet 10 mg   . potassium chloride 10 mEq in 50 mL *CENTRAL LINE* IVPB   . ipratropium-albuterol (DUONEB) 0.5-2.5 (3) MG/3ML nebulizer solution 3 mL   . sodium chloride flush (NS) 0.9 % injection 10-40 mL   . sodium chloride flush (NS) 0.9 % injection 10-40 mL   . Chlorhexidine Gluconate Cloth 2 % PADS 6 each   . furosemide (LASIX) injection 40 mg   . dextrose 5 % solution   . 0.9 %  sodium chloride infusion   . levothyroxine (SYNTHROID, LEVOTHROID) tablet 50 mcg   . hydrALAZINE (APRESOLINE) tablet 25 mg   . phenytoin (DILANTIN) 135 mg in sodium chloride 0.9 % 100 mL IVPB   . phenytoin (DILANTIN) 150 mg in sodium chloride 0.9 % 100 mL IVPB   . lactated ringers infusion   . cefTRIAXone (ROCEPHIN) 1 g in sodium chloride 0.9 % 100 mL IVPB     Social History:  reports that he has never smoked. He quit smokeless tobacco use  about 4 years ago.  His smokeless tobacco use included chew. He reports that he does not drink alcohol or use drugs.  Family  History:   Family History  Problem Relation Age of Onset  . Alpha-1 antitrypsin deficiency Neg Hx   . Asthma Neg Hx   . Breast cancer Neg Hx   . Stroke Neg Hx   . Colon cancer Neg Hx   . Coronary artery disease Neg Hx   . COPD Neg Hx   . Cystic fibrosis Neg Hx   . Diabetes Neg Hx   . Deep vein thrombosis Neg Hx   . Hypertension Neg Hx   . Hyperlipidemia Neg Hx   . Kidney disease Neg Hx   . Liver disease Neg Hx   . Lung cancer Neg Hx   . Lupus Neg Hx   . Neurofibromatosis Neg Hx   . Osteoporosis Neg Hx   . Ovarian cancer Neg Hx   . Positive PPD/TB Exposure Neg Hx   . Prostate cancer Neg Hx   . Pulmonary embolism Neg Hx   . Pulmonary fibrosis Neg Hx   . Rheum arthritis Neg Hx   . Sarcoidosis Neg Hx   . Sleep apnea Neg Hx   . Thyroid disease Neg Hx   . Tuberculosis Neg Hx   . Tuberous sclerosis Neg Hx     Review of systems not obtained due to patient factors. Weight change: -1.4 kg  Intake/Output Summary (Last 24 hours) at 11/07/2017 1446 Last data filed at 11/06/2017 1827 Gross per 24 hour  Intake 180 ml  Output 400 ml  Net -220 ml   BP 121/82 (BP Location: Right Arm)   Pulse 86   Temp 99.4 F (37.4 C) (Axillary)   Resp 20   Ht 5' 9"  (1.753 m)   Wt 134.9 kg   SpO2 96%   BMI 43.92 kg/m  Vitals:   11/07/17 0814 11/07/17 0835 11/07/17 1141 11/07/17 1419  BP:  (!) 142/84 121/82   Pulse:  89 86   Resp:  (!) 22 20   Temp:  (!) 100.4 F (38 C) 99.4 F (37.4 C)   TempSrc:  Axillary Axillary   SpO2: 95% 95% 97% 96%  Weight:      Height:         General appearance: moderately obese and right eye is open but not following commands and is nonverbal Head: Normocephalic, without obvious abnormality, atraumatic Eyes: negative findings: lids and lashes normal, conjunctivae and sclerae normal and corneas clear Resp: clear to auscultation bilaterally Cardio: regular rate and rhythm, S1, S2 normal, no murmur, click, rub or gallop GI: soft, non-tender; bowel sounds  normal; no masses,  no organomegaly Extremities: extremities normal, atraumatic, no cyanosis or edema  Labs: Basic Metabolic Panel: Recent Labs  Lab 10/31/17 1812  11/01/17 0503 11/01/17 1700 11/02/17 0500 11/03/17 0500 11/04/17 0448 11/05/17 0017 11/06/17 0452 11/06/17 2149 11/07/17 0626  NA  --    < > 142  --  146* 149* 153* 157* 157* 158* 159*  K  --    < > 3.8  --  4.2 3.1* 3.0* 3.3* 3.5 3.7 4.3  CL  --    < > 111  --  114* 114* 117* 120* 120* 120* 120*  CO2  --    < > 21*  --  23 24 27 24 25 25 24   GLUCOSE  --    < > 234*  --  241* 242* 181* 231* 209*  268* 201*  BUN  --    < > 55*  --  66* 55* 46* 51* 47* 46* 50*  CREATININE  --    < > 2.14*  --  2.00* 1.57* 1.28* 1.60* 1.79* 1.98* 2.26*  ALBUMIN  --   --   --   --   --   --   --  2.3*  --   --   --   CALCIUM  --    < > 7.8*  --  8.3* 8.9 9.2 9.1 8.7* 8.6* 8.6*  PHOS 4.2  --  4.8* 4.6  --   --   --  3.5 4.1  --  5.5*   < > = values in this interval not displayed.   Liver Function Tests: Recent Labs  Lab 11/05/17 0017  AST 20  ALT 17  ALKPHOS 65  BILITOT 1.0  PROT 6.2*  ALBUMIN 2.3*   No results for input(s): LIPASE, AMYLASE in the last 168 hours. Recent Labs  Lab 11/07/17 1023  AMMONIA 37*   CBC: Recent Labs  Lab 11/04/17 0448 11/05/17 0017 11/06/17 0452 11/07/17 0626  WBC 9.8 9.7 8.4 11.0*  NEUTROABS  --  6.3 4.6 7.0  HGB 13.9 13.7 14.2 13.7  HCT 46.1 45.8 48.9 48.1  MCV 92.2 92.5 94.4 96.8  PLT 345 377 342 226   PT/INR: @LABRCNTIP (inr:5) Cardiac Enzymes: ) Recent Labs  Lab 11/07/17 0626  CKTOTAL 125   CBG: Recent Labs  Lab 11/06/17 1957 11/06/17 2356 11/07/17 0353 11/07/17 0813 11/07/17 1138  GLUCAP 274* 172* 146* 160* 160*    Iron Studies: No results for input(s): IRON, TIBC, TRANSFERRIN, FERRITIN in the last 168 hours.  Xrays/Other Studies: Ct Head Wo Contrast  Result Date: 11/06/2017 CLINICAL DATA:  52 y/o M; postictal, altered mental status, recent fall before admission,  history of seizures. EXAM: CT HEAD WITHOUT CONTRAST TECHNIQUE: Contiguous axial images were obtained from the base of the skull through the vertex without intravenous contrast. COMPARISON:  10/25/2017 MRI head.  10/24/2017 CT head. FINDINGS: Brain: No evidence of acute infarction, hemorrhage, hydrocephalus, extra-axial collection or mass lesion/mass effect. 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 No hyperdense vessel. Skull: Normal. Negative for fracture or focal lesion. Sinuses/Orbits: Mild right posterior ethmoid air cell mucosal thickening. Additional visible paranasal sinuses and the mastoid air cells are normally aerated. Orbits are unremarkable. Other: None. IMPRESSION: 1. No acute intracranial abnormality. 2. 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 Electronically Signed   By: Kristine Garbe M.D.   On: 11/06/2017 21:52     Assessment/Plan: 1.  AKI/CKD stage 3- in setting of status epilepticus and volume depletion.  Will stop lactated ringers and start 1/2 NS due to hypernatremia.  His bladder scan had >300 cc's and nurse instructed to place I&O cath but may need to have foley replaced.  Renal US pending as well as urine studies.  Continue to hold lisinopril and avoid nephrotoxic agents.  2. Hypernatremia- free water deficit is 11 liters.  Started on 1/2 NS at 100 ml/hr but will likely need to increase rate.  Will follow sodium levels.  3. Refractory partial right occipital seizures- Neuro following and has pt on fosphenytoin, phenytoin, keppra and vimpat.  Also stressing glucose control.  Currently unresponsive which appears to have been occurring intermittently during his admissions.  4. Hypertensive urgency- improved with medications 5. DM- per primary svc 6.  OSA on CPAP per Primary svc   Donetta Potts 11/07/2017, 2:46 PM

## 2017-11-07 NOTE — Progress Notes (Signed)
Shift event: RRRN called to bedside for pt's altered LOC. An ABG was being obtained. NP went to bedside.  S: pt is not able to participate in ROS due to altered LOC. Per family, questionable seizure activity earlier. Apparently, was talking more earlier 9/16.  O: Chronically ill appearing WM in NAD. Not toxic appearing. VS reviewed. Febrile. BP stable. RR normal. O2 sat upper 90s on 5L (this is not an increase from earlier). Card: RRR. No LE edema. Lungs: CTA except RLL. Respiratory effort is normal. Neuro: will open eyes to tactile stimulation but will not keep them open. Says his name. Follows some commands, but not others. No withdrawal to painful stimuli. Moves his head back and forth but favors right. PERRL. At one point, he knew his mom and friend at bedside, but then goes right back to sleep.  A/P: 1. Altered LOC-not sure how much of this is new. In review of chart, it seems his LOC/mental status has been waxing and waning. Dilantin level checked and low. Stat CT head done and is neg for acute. Could he be seizing again and he is post ictal now? Is it metabolic. Rechecked BMP and Na is a bit higher as well as creatinine. Started IVFs. Contacted neuro on call and he came to see pt and will place note in chart and orders for more Dilantin. A r/p EEG will be done in am.  2. Seizures-see #1.  3. Febrile-Tylenol IV given due to diarrhea and can not take po's at present.  4. AKI-started IVFs, recheck in am. Lasix on hold.  5. DM-sugar OK.  6. Hypoventilation syndrome/OSA-ABG with only slightly elevated PCO2 but NP does not believe it is high enough to cause this level of altered mental status. PO2 normal. Continue O2. Holding CPAP due to altered LOC and risk of aspiration.  Will follow. Neuro to follow as well.  CRITICAL CARE Performed by: Clance Boll   Total critical care time: 90 minutes  Critical care time was exclusive of separately billable procedures and treating other  patients.  Critical care was necessary to treat or prevent imminent or life-threatening deterioration.  Critical care was time spent personally by me on the following activities: development of treatment plan with patient and/or surrogate as well as nursing, discussions with consultants, evaluation of patient's response to treatment, examination of patient, obtaining history from patient or surrogate, ordering and performing treatments and interventions, ordering and review of laboratory studies, ordering and review of radiographic studies, pulse oximetry and re-evaluation of patient's condition.

## 2017-11-07 NOTE — Progress Notes (Signed)
MEDICATION RELATED CONSULT NOTE - INITIAL   Pharmacy Consult for phenytoin Indication: Status epilepticus / seizures  Allergies  Allergen Reactions  . Montelukast Other (See Comments)    Possibly cause headaches per spouse    Patient Measurements: Height: 5\' 9"  (175.3 cm) Weight: 297 lb 6.4 oz (134.9 kg) IBW/kg (Calculated) : 70.7  Vital Signs: Temp: 99.4 F (37.4 C) (09/17 1141) Temp Source: Axillary (09/17 1141) BP: 121/82 (09/17 1141) Pulse Rate: 86 (09/17 1141) Intake/Output from previous day: 09/16 0701 - 09/17 0700 In: 180 [P.O.:180] Out: 1400 [Urine:1400] Intake/Output from this shift: No intake/output data recorded.  Labs: Recent Labs    11/05/17 0017 11/06/17 0452 11/06/17 2149 11/07/17 0626  WBC 9.7 8.4  --  11.0*  HGB 13.7 14.2  --  13.7  HCT 45.8 48.9  --  48.1  PLT 377 342  --  226  CREATININE 1.60* 1.79* 1.98* 2.26*  MG 2.1 2.3  --  2.1  PHOS 3.5 4.1  --  5.5*  ALBUMIN 2.3*  --   --   --   PROT 6.2*  --   --   --   AST 20  --   --   --   ALT 17  --   --   --   ALKPHOS 65  --   --   --   BILITOT 1.0  --   --   --   BILIDIR 0.1  --   --   --   IBILI 0.9  --   --   --    Estimated Creatinine Clearance: 51.5 mL/min (A) (by C-G formula based on SCr of 2.26 mg/dL (H)).   Microbiology: Recent Results (from the past 720 hour(s))  MRSA PCR Screening     Status: None   Collection Time: 10/28/17 10:55 AM  Result Value Ref Range Status   MRSA by PCR NEGATIVE NEGATIVE Final    Comment:        The GeneXpert MRSA Assay (FDA approved for NASAL specimens only), is one component of a comprehensive MRSA colonization surveillance program. It is not intended to diagnose MRSA infection nor to guide or monitor treatment for MRSA infections. Performed at [facility], [place] [address], [place], [place] [address]   CSF culture     Status: None   Collection Time: 10/28/17  1:46 PM  Result Value Ref Range Status   Specimen Description CSF  Final   Special Requests NONE  Final   Gram Stain   Final    WBC PRESENT, PREDOMINANTLY MONONUCLEAR NO ORGANISMS SEEN CYTOSPIN SMEAR    Culture   Final    NO GROWTH 3 DAYS Performed at [facility], [place] [address], [place], [place] [address]    Report Status 10/31/2017 FINAL  Final  Culture, respiratory (non-expectorated)     Status: None   Collection Time: 10/31/17  9:01 AM  Result Value Ref Range Status   Specimen Description TRACHEAL ASPIRATE  Final   Special Requests NONE  Final   Gram Stain   Final    MODERATE WBC PRESENT, PREDOMINANTLY PMN FEW GRAM NEGATIVE RODS RARE GRAM POSITIVE RODS    Culture   Final    MODERATE HAEMOPHILUS INFLUENZAE BETA LACTAMASE NEGATIVE Performed at [facility], [address] [place], [place] [address]    Report Status 11/01/2017 FINAL  Final  Culture, blood (Routine X 2) w Reflex to ID Panel     Status: None  Collection Time: 10/31/17 11:04 AM  Result Value Ref Range Status   Specimen Description BLOOD BLOOD RIGHT HAND  Final   Special Requests   Final    BOTTLES DRAWN AEROBIC ONLY Blood Culture adequate volume   Culture   Final    NO GROWTH 5 DAYS Performed at [facility], [address] [place], [place] [address]    Report Status 11/05/2017 FINAL  Final  Culture, blood (Routine X 2) w Reflex to ID Panel     Status: None   Collection Time: 10/31/17 11:08 AM  Result Value Ref Range Status   Specimen Description BLOOD BLOOD RIGHT HAND  Final   Special Requests   Final    BOTTLES DRAWN AEROBIC ONLY Blood Culture results may not be optimal due to an inadequate volume of blood received in culture bottles   Culture   Final    NO GROWTH 5 DAYS Performed at [facility], [place] [address] [place], [place] [address]    Report Status 11/05/2017 FINAL  Final  C difficile quick scan w PCR reflex     Status: None   Collection Time: 11/02/17  8:30 AM  Result Value Ref Range Status   C Diff antigen NEGATIVE NEGATIVE Final   C  Diff toxin NEGATIVE NEGATIVE Final   C Diff interpretation No C. difficile detected.  Final    Comment: Performed at [facility], [place] [address] [place], [place] [address]  Culture, blood (Routine X 2) w Reflex to ID Panel     Status: None (Preliminary result)   Collection Time: 11/06/17 10:50 AM  Result Value Ref Range Status   Specimen Description BLOOD RIGHT ANTECUBITAL  Final   Special Requests   Final    BOTTLES DRAWN AEROBIC ONLY Blood Culture results may not be optimal due to an inadequate volume of blood received in culture bottles   Culture   Final    NO GROWTH 1 DAY Performed at [facility], [place] [address], [place], [place] [address]    Report Status PENDING  Incomplete  Culture, blood (Routine X 2) w Reflex to ID Panel     Status: None (Preliminary result)   Collection Time: 11/06/17 11:19 AM  Result Value Ref Range Status   Specimen Description BLOOD RIGHT ANTECUBITAL  Final   Special Requests   Final    BOTTLES DRAWN AEROBIC ONLY Blood Culture adequate volume   Culture   Final    NO GROWTH 1 DAY Performed at [facility], [place] [address] [place], [place] [address]    Report Status PENDING  Incomplete    Medical History: Past Medical History:  Diagnosis Date  . Pneumonia   . Pulmonary embolism ([place])   . Sleep apnea    Assessment: 52 yo M presents with status epilepticus. Started on several anti-seizure medications. Now on phenytoin and Keppra. Phenytoin trough last night was low at < 2.5 and level checked today at ~0600 up to 18 but was checked while IV dose was being infused.  Goal of Therapy:  Total Phenytoin 10-20mcg/mL  Prevention of seizures  Plan:  Neuro increased phenytoin to 175mg  IV Q8h this morning (monitor closely with AKI) Continue Keppra 1g IV Q12h per Neuro Monitor for seizures, renal function, phenytoin levels at Css F/u plans to switch PO  Tyann Niehaus J 11/07/2017,1:47 PM

## 2017-11-07 NOTE — Progress Notes (Signed)
Cortrak and foley not present on assessment

## 2017-11-07 NOTE — Progress Notes (Signed)
EEG Completed; Results Pending  

## 2017-11-07 NOTE — Progress Notes (Signed)
Pt LOC still questionable.  Not responsive to stimuli and still jerking head.  RT holding cpap until this improves.  RT will monitor.

## 2017-11-07 NOTE — Procedures (Signed)
ELECTROENCEPHALOGRAM REPORT   Patient: Daniel [name]       Room #: 5Y65K EEG No. ID: [id number] Age: 52 y.o.        Sex: male Referring Physician: Karleen Hampshire Report Date:  11/07/2017        Interpreting Physician: Alexis Goodell  History: Daniel [name] is an 52 y.o. male with worsening mental status  Medications:  ASA, Pulmicort, Rocephin, Insulin, Vimpat, Keppra, Synthroid, Lopressor, Protonix, Dilantin  Conditions of Recording:  This is a 21 channel routine scalp EEG performed with bipolar and monopolar montages arranged in accordance to the international 10/20 system of electrode placement. One channel was dedicated to EKG recording.  The patient is in the poorly responsive state.  Description:  The background activity is slow and poorly organized.  It consists of irregular, low voltage theta and beta activity.  There is an attempt to alert the patient but the patient remains poorly responsive.   No epileptiform activity is noted.   Hyperventilation and intermittent photic stimulation were not performed.   IMPRESSION: This EEG is characterized by slowing which is consistent with normal drowse.  Can not rule out the possibility of slowing related to general cerebral disturbance such as a metabolic encephalopathy.  Clinical correlation recommended.  No epileptiform activity is noted.     Alexis Goodell, MD Neurology [phone] 11/07/2017, 11:57 AM

## 2017-11-07 NOTE — Progress Notes (Signed)
[place] number for total lift bed- questions [phone]

## 2017-11-07 NOTE — Progress Notes (Signed)
I was called regarding the patient's worsening mental status.  Apparently he has had a change today compared to yesterday per his patient's mother.  This morning he was still trying to talk, but over the course the day he has become progressively more confused.  Neuro: Mental Status: He is awake, opens eyes and fixates and tracks, follows command to stick out tongue but does not reliably follow appendicular commands Cranial Nerves: II: Appears to blink to threat bilaterally, clearly fixates pupils are equal, round, and reactive to light.   III,IV, VI: He tracks across midline in both directions V: VII: Blinks to eyelid stimulation bilaterally,?  Mild left nasolabial flattening Motor: He has minimal flexion versus withdrawal to noxious stimuli in all 4 extremities  sensory: As above  Cerebellar: He does not perform  CSF WBC 4 CSF RBC 99 CSF protein 81 CSF glucose 104 CSF IgG index-normal  Impression: 52 year old male admitted on 9/6 with refractory partial right occipital seizures that have been ongoing for about a month.  With his severe hyperglycemia and T2 hypointensity on MRI (available through PACS from [facility]) I continue to suspect this represents hyperglycemic  related seizures.  The exact mechanism for the seizures and MRI changes are slightly unclear, but this is a described entity.  I am uncertain of the exact reason for his worsening mental status, but seizures are certainly a possibility and he is subtherapeutic on his Dilantin.  1) fosphenytoin load 1500 mg x 1 2) increase phenytoin to 150 3 times daily 3) continue Keppra 1 g twice daily 4) continue Vimpat 200 twice daily 5) repeat EEG  Roland Rack, MD [facility] [phone]  If 7pm- 7am, please page neurology on call as listed in [place].

## 2017-11-07 NOTE — Progress Notes (Signed)
  Speech Language Pathology Treatment: Dysphagia  Patient Details Name: Daniel [name] MRN: [id number] DOB: 1965/08/25 Today's Date: 11/07/2017 Time: 1120-1140 SLP Time Calculation (min) (ACUTE ONLY): 20 min  Assessment / Plan / Recommendation Clinical Impression  Pt is insufficiently arousable for lunch meal. SLP repositioned pt, washed his face and suctioned his mouth, which elicited eye opening and minimal interaction. Pt indicated he did want a sip of juice, but was too drowsy during attempts to sip or bite puree. NT reports he was quite alert for dinner yesterday, so will leave diet order in place with the hope that pt will be more alert this evening for PO intake.   HPI HPI: 52 yo male presented to [place] with altered mental status, confusion, headaches, hallucinations, and falls.  SBP was over 200.  In ER at [facility] he was started on esmolol gtt.  Developed seizure in [place] at [place].  He continued to have seizures and transferred to [facility]  He was started on burst suppression with versed and required intubation 9/07.  Developed fever and increased sputum 9/10. Extubated 9/13.       SLP Plan  Continue with current plan of care       Recommendations  Diet recommendations: Dysphagia 2 (fine chop);Honey-thick liquid Liquids provided via: Cup Medication Administration: Crushed with puree Supervision: Staff to assist with self feeding Compensations: Slow rate;Small sips/bites Postural Changes and/or Swallow Maneuvers: Seated upright 90 degrees                Follow up Recommendations: Skilled Nursing facility SLP Visit Diagnosis: Dysphagia, oropharyngeal phase (R13.12) Plan: Continue with current plan of care       GO               [facility], MA CCC-SLP [phone]  Lynann Beaver 11/07/2017, 12:11 PM

## 2017-11-07 NOTE — Progress Notes (Signed)
PROGRESS NOTE    Daniel [name]  YIF:[id number] DOB: 1965-06-29 DOA: 10/27/2017 PCP: Antonietta Jewel, MD   Brief Narrative:  52 yo male with prior h/o IDDM, HTN, OSA, OHS, Seizures, PE presented to [facility] hospital with altered mental status, confusion, headaches, hallucinations, and falls. SBP was over 200. In ER at [facility] he was started on esmolol gtt. Developed seizure in [place] at [place]. He continued to have seizures and transferred to [facility]. He was  intubated 9/07 and admitted to [facility] service. 9/14 - deemed severe aspiration risk post extubation. Family says he uses CPAP at night.  Assessment & Plan:   Principal Problem:   Recurrent seizures ([place]) Active Problems:   Sleep apnea   Obesity hypoventilation syndrome ([place])   Hypertension   Status epilepticus ([place])   Status epilepticus : Overnight pt had a change in mental status, more lethargic when compared to the morning. Neurology consulted, and he was given a dose of fosphenytoin, increased phenytoin to 150 mg TID.  Resume vimpat , keppra at the same dose.  He is alert this am and is able to follow simple commands.    Acute respiratory failure with hypoxia sec to OSA, OHS, CAP from haemophilus influenza.  - resume IV rocephin to complete the course. , [place] oxygen tokeep sats greater than 90%. He is on 5 lit of  oxygen.  Repeat CXR on the 9/15 shows Patchy density at both lung bases consistent with atelectasis or mild pneumonia. But pt continues to be febrile, .  Repeat blood cultures and urine cultures ordered. ID consulted for antibiotics.  His left upper extremity is swollen, venous duplex of the left upper extremity  Negative for  DVT.   Type 2 DM:  CBG (last 3)  Recent Labs    11/06/17 2356 11/07/17 0353 11/07/17 0813  GLUCAP 172* 146* 160*   Resume SSI. No change in meds.    Nutrition: SLP consulted and he underwent FEES yesterday and was started on dysphagia 2 diet.    Diarrhea:   c diff pcr negative. Pt  has a rectal tube.  Plan to take it out, once diarrhea improves.    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 Adequate urine output overnight to 1,400. CK levels wnl. Pt appears fluid overloaded, but with his sodium going up to 159. Will request nephrology for assistance.     Hypernatremia:  Free water boluses started . 272ml every 8 hours. If it doesn't improve, will start him on slow rate dextrose fluids.    Hypothyroidism:  Resume synthroid.    Hypokalemia  Replaced and repeat level wnl.  Magnesium level wnl.   Mild leukocytosis: Possibly from pneumonia.  Monitor.    DVT prophylaxis: sq heparin Code Status: (full code. ) Family Communication: discussed with family  At bedside.  Disposition Plan: pending clinical improvement.    Consultants:   Neurology  Nephrology   Infectious Disease.     Procedures:  9/07 Admit and intubated. ETT 9/07 >> Lt IJ CVL 9/07 >>  EEG 9/07 >> focal non convulsive status epilepticus from Rt occipital region LP 9/08 >> RBC 99, WBC 4, glucose 104, protein 81 Sputum cultures from 9/10  grew haemophilus influenzae.  9/13 Extubate C diff PCR 9/12 >>negative   Antimicrobials:rocephin since 9/12.    Subjective:  change in mental status overnight. CT head negative.  This am he is more alert and able to follow simple commands.  Objective: Vitals:   11/07/17 0500 11/07/17 0814 11/07/17 0835 11/07/17 1141  BP:   (!) 142/84 121/82  Pulse:   89 86  Resp:   (!) 22 20  Temp:   (!) 100.4 F (38 C) 99.4 F (37.4 C)  TempSrc:   Axillary Axillary  SpO2:  95% 95% 97%  Weight: 134.9 kg     Height:        Intake/Output Summary (Last 24 hours) at 11/07/2017 1234 Last data filed at 11/06/2017 1827 Gross per 24 hour  Intake 180 ml  Output 400 ml  Net -220 ml   Filed Weights   11/05/17 0400 11/06/17 0500 11/07/17 0500  Weight: 133.5 kg (!) 136.3 kg 134.9 kg    Examination:  General exam:  alert on 5 lit of [place] Oxygen.  Respiratory system: continues to have diminished air at bases, no wheezing or rhonchi.  Cardiovascular system: S1 & S2 heard, RRR. 2+ Pedal edema present.  Gastrointestinal system: Abdomen is soft NT nd bs+ Central nervous system: following some commands. Alert  Extremities: 2+ edema of the upper extremities left more than the right. Edema of the lower extremities.  Skin: see above.  Psychiatry: cannot be assessed at this time as pt is confused.     Data Reviewed: I have personally reviewed following labs and imaging studies  CBC: Recent Labs  Lab 11/03/17 0500 11/04/17 0448 11/05/17 0017 11/06/17 0452 11/07/17 0626  WBC 9.3 9.8 9.7 8.4 11.0*  NEUTROABS  --   --  6.3 4.6 7.0  HGB 13.3 13.9 13.7 14.2 13.7  HCT 44.1 46.1 45.8 48.9 48.1  MCV 91.9 92.2 92.5 94.4 96.8  PLT 327 345 377 342 704   Basic Metabolic Panel: Recent Labs  Lab 11/01/17 0503 11/01/17 1700  11/04/17 0448 11/05/17 0017 11/06/17 0452 11/06/17 2149 11/07/17 0626  NA 142  --    < > 153* 157* 157* 158* 159*  K 3.8  --    < > 3.0* 3.3* 3.5 3.7 4.3  CL 111  --    < > 117* 120* 120* 120* 120*  CO2 21*  --    < > 27 24 25 25 24   GLUCOSE 234*  --    < > 181* 231* 209* 268* 201*  BUN 55*  --    < > 46* 51* 47* 46* 50*  CREATININE 2.14*  --    < > 1.28* 1.60* 1.79* 1.98* 2.26*  CALCIUM 7.8*  --    < > 9.2 9.1 8.7* 8.6* 8.6*  MG 2.1 2.0  --  2.2 2.1 2.3  --  2.1  PHOS 4.8* 4.6  --   --  3.5 4.1  --  5.5*   < > = values in this interval not displayed.   GFR: Estimated Creatinine Clearance: 51.5 mL/min (A) (by C-G formula based on SCr of 2.26 mg/dL (H)). Liver Function Tests: Recent Labs  Lab 11/05/17 0017  AST 20  ALT 17  ALKPHOS 65  BILITOT 1.0  PROT 6.2*  ALBUMIN 2.3*   No results for input(s): LIPASE, AMYLASE in the last 168 hours. Recent Labs  Lab 11/07/17 1023  AMMONIA 37*   Coagulation Profile: No results for input(s): INR, PROTIME in the last 168  hours. Cardiac Enzymes: Recent Labs  Lab 11/07/17 0626  CKTOTAL 125   BNP (last 3 results) No results for input(s): PROBNP in the last 8760 hours. HbA1C: No results for input(s): HGBA1C in the last 72 hours. CBG:  Recent Labs  Lab 11/06/17 1603 11/06/17 1957 11/06/17 2356 11/07/17 0353 11/07/17 0813  GLUCAP 208* 274* 172* 146* 160*   Lipid Profile: No results for input(s): CHOL, HDL, LDLCALC, TRIG, CHOLHDL, LDLDIRECT in the last 72 hours. Thyroid Function Tests: No results for input(s): TSH, T4TOTAL, FREET4, T3FREE, THYROIDAB in the last 72 hours. Anemia Panel: No results for input(s): VITAMINB12, FOLATE, FERRITIN, TIBC, IRON, RETICCTPCT in the last 72 hours. Sepsis Labs: Recent Labs  Lab 11/01/17 0503 11/02/17 0500 11/05/17 0017  PROCALCITON 0.54 0.49  --   LATICACIDVEN  --   --  1.1    Recent Results (from the past 240 hour(s))  CSF culture     Status: None   Collection Time: 10/28/17  1:46 PM  Result Value Ref Range Status   Specimen Description CSF  Final   Special Requests NONE  Final   Gram Stain   Final    WBC PRESENT, PREDOMINANTLY MONONUCLEAR NO ORGANISMS SEEN CYTOSPIN SMEAR    Culture   Final    NO GROWTH 3 DAYS Performed at [facility], [address], [place], [place] [address]    Report Status 10/31/2017 FINAL  Final  Culture, respiratory (non-expectorated)     Status: None   Collection Time: 10/31/17  9:01 AM  Result Value Ref Range Status   Specimen Description TRACHEAL ASPIRATE  Final   Special Requests NONE  Final   Gram Stain   Final    MODERATE WBC PRESENT, PREDOMINANTLY PMN FEW GRAM NEGATIVE RODS RARE GRAM POSITIVE RODS    Culture   Final    MODERATE HAEMOPHILUS INFLUENZAE BETA LACTAMASE NEGATIVE Performed at [facility], [address], [place], [place] [address]    Report Status 11/01/2017 FINAL  Final  Culture, blood (Routine X 2) w Reflex to ID Panel     Status: None   Collection Time: 10/31/17 11:04 AM   Result Value Ref Range Status   Specimen Description BLOOD BLOOD RIGHT HAND  Final   Special Requests   Final    BOTTLES DRAWN AEROBIC ONLY Blood Culture adequate volume   Culture   Final    NO GROWTH 5 DAYS Performed at [facility], [place] [address], [place], [place] [address]    Report Status 11/05/2017 FINAL  Final  Culture, blood (Routine X 2) w Reflex to ID Panel     Status: None   Collection Time: 10/31/17 11:08 AM  Result Value Ref Range Status   Specimen Description BLOOD BLOOD RIGHT HAND  Final   Special Requests   Final    BOTTLES DRAWN AEROBIC ONLY Blood Culture results may not be optimal due to an inadequate volume of blood received in culture bottles   Culture   Final    NO GROWTH 5 DAYS Performed at [facility], [place] [address] [place], [place] [address]    Report Status 11/05/2017 FINAL  Final  C difficile quick scan w PCR reflex     Status: None   Collection Time: 11/02/17  8:30 AM  Result Value Ref Range Status   C Diff antigen NEGATIVE NEGATIVE Final   C Diff toxin NEGATIVE NEGATIVE Final   C Diff interpretation No C. difficile detected.  Final    Comment: Performed at [facility], [place] [address], [place], [place] [address]  Culture, blood (Routine X 2) w Reflex to ID Panel     Status: None (Preliminary result)   Collection Time:  11/06/17 10:50 AM  Result Value Ref Range Status   Specimen Description BLOOD RIGHT ANTECUBITAL  Final   Special Requests   Final    BOTTLES DRAWN AEROBIC ONLY Blood Culture results may not be optimal due to an inadequate volume of blood received in culture bottles   Culture   Final    NO GROWTH 1 DAY Performed at [facility] [address], [place], [place] [address]    Report Status PENDING  Incomplete  Culture, blood (Routine X 2) w Reflex to ID Panel     Status: None (Preliminary result)   Collection Time: 11/06/17 11:19 AM  Result Value Ref Range Status   Specimen Description BLOOD RIGHT ANTECUBITAL   Final   Special Requests   Final    BOTTLES DRAWN AEROBIC ONLY Blood Culture adequate volume   Culture   Final    NO GROWTH 1 DAY Performed at [facility], [place] [address] [place], [place] [address]    Report Status PENDING  Incomplete         Radiology Studies: Ct Head Wo Contrast  Result Date: 11/06/2017 CLINICAL DATA:  52 y/o M; postictal, altered mental status, recent fall before admission, history of seizures. EXAM: CT HEAD WITHOUT CONTRAST TECHNIQUE: Contiguous axial images were obtained from the base of the skull through the vertex without intravenous contrast. COMPARISON:  10/25/2017 MRI head.  10/24/2017 CT head. FINDINGS: Brain: No evidence of acute infarction, hemorrhage, hydrocephalus, extra-axial collection or mass lesion/mass effect. 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 No hyperdense vessel. Skull: Normal. Negative for fracture or focal lesion. Sinuses/Orbits: Mild right posterior ethmoid air cell mucosal thickening. Additional visible paranasal sinuses and the mastoid air cells are normally aerated. Orbits are unremarkable. Other: None. IMPRESSION: 1. No acute intracranial abnormality. 2. 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 Electronically Signed   By: Kristine Garbe M.D.   On: 11/06/2017 21:52        Scheduled Meds: . aspirin  81 mg Oral Daily  . budesonide (PULMICORT) nebulizer solution  0.25 mg Nebulization BID  . chlorhexidine  15 mL Mouth Rinse BID  . free water  200 mL Per Tube Q8H  . heparin injection (subcutaneous)  5,000 Units Subcutaneous Q8H  . insulin aspart  0-20 Units Subcutaneous Q4H  . ipratropium-albuterol  3 mL Nebulization TID  . levothyroxine  25 mcg Intravenous Daily  . mouth rinse  15 mL Mouth Rinse q12n4p  . metoprolol tartrate  5 mg Intravenous Q6H  . pantoprazole sodium  40 mg  Per Tube Q24H   Continuous Infusions: . cefTRIAXone (ROCEPHIN)  IV 1 g (11/07/17 0946)  . lacosamide (VIMPAT) IV 200 mg (11/07/17 1056)  . lactated ringers 100 mL/hr at 11/07/17 0002  . levETIRAcetam 1,000 mg (11/07/17 1027)  . phenytoin (DILANTIN) IV       LOS: 11 days    Time spent: 35 minutes.     Hosie Poisson, MD [facility] Pager [phone]  If 7PM-7AM, please contact night-coverage [email or web address] Password [facility] 11/07/2017, 12:34 PM

## 2017-11-07 NOTE — Progress Notes (Signed)
Patient alert but disoriented to place. Not able to answer some questions and also keep mentioning" Daniel [name]". RN asked who [name] was and he responded 'my wife". RN called pt's spouse and informed her about her husband needing to talk to her. Patient's spouse assured RN that she was coming up to see her husband if awake. Patient's was updated.

## 2017-11-07 NOTE — Progress Notes (Signed)
Reason for consult:   Subjective: Patient more alert today compared to yesterday.  Answer simple questions tracks examiners.  Voice is severely dysarthric.   ROS: Unable to obtain due to poor mental status  Examination  Vital signs in last 24 hours: Temp:  [99.4 F (37.4 C)-101.9 F (38.8 C)] 101.9 F (38.8 C) (09/17 1517) Pulse Rate:  [81-96] 81 (09/17 1517) Resp:  [20-22] 20 (09/17 1517) BP: (121-160)/(74-88) 130/74 (09/17 1517) SpO2:  [95 %-100 %] 97 % (09/17 1939) Weight:  [134.9 kg] 134.9 kg (09/17 0500)  General: Not in distress, cooperative CVS: pulse-normal rate and rhythm RS: breathing comfortably Extremities: normal   Neuro: MS: Awake, answers some questions intermittently Speech: severely dysarthric  CN: pupils equal and reactive,  EOMI, does not cooperate for visual field testing, blinks to threat bilaterally, tracks examiner, face I symmetric,  Motor: flaccid in all 4 extremities Reflexes: absent patella bilaterally Coordination: not tested Gait: not tested  Basic Metabolic Panel: Recent Labs  Lab 11/01/17 0503 11/01/17 1700  11/04/17 0448 11/05/17 0017 11/06/17 0452 11/06/17 2149 11/07/17 0626  NA 142  --    < > 153* 157* 157* 158* 159*  K 3.8  --    < > 3.0* 3.3* 3.5 3.7 4.3  CL 111  --    < > 117* 120* 120* 120* 120*  CO2 21*  --    < > 27 24 25 25 24   GLUCOSE 234*  --    < > 181* 231* 209* 268* 201*  BUN 55*  --    < > 46* 51* 47* 46* 50*  CREATININE 2.14*  --    < > 1.28* 1.60* 1.79* 1.98* 2.26*  CALCIUM 7.8*  --    < > 9.2 9.1 8.7* 8.6* 8.6*  MG 2.1 2.0  --  2.2 2.1 2.3  --  2.1  PHOS 4.8* 4.6  --   --  3.5 4.1  --  5.5*   < > = values in this interval not displayed.    CBC: Recent Labs  Lab 11/03/17 0500 11/04/17 0448 11/05/17 0017 11/06/17 0452 11/07/17 0626  WBC 9.3 9.8 9.7 8.4 11.0*  NEUTROABS  --   --  6.3 4.6 7.0  HGB 13.3 13.9 13.7 14.2 13.7  HCT 44.1 46.1 45.8 48.9 48.1  MCV 91.9 92.2 92.5 94.4 96.8  PLT 327 345 377 342  226     Coagulation Studies: No results for input(s): LABPROT, INR in the last 72 hours.  Imaging Reviewed:   MRI Aaron Edelman, CT Head    ASSESSMENT AND PLAN  52 year old male admitted on 9/6 with refractory partial right occipital seizures that have been ongoing for about a month, thought to be related to severe hypoglycemia with T2 hypointensity on MRI brain. Neruology was reconsulted as patient appeared more lethargic yesterday.  He was loaded with fosphenytoin and dose was increased to 150 mg 3 times daily.   The patient is more alert following simple commands and answering simple questions. Patient appears to be significantly weak all 4 extremities with diminished reflexes.  Possibly from deconditioning, critical illness neuropathy from prolonged hospitalization, ICU stay and s/p burst suppression.  EEG performed today showed generalized slowing  Recommendations Continue AEDs Continue hyperglycemic control  Will continue to follow.    Karena Addison Garyn Arlotta [facility] Pager Number [phone] For questions after 7pm please refer to AMION to reach the Neurologist on call

## 2017-11-07 NOTE — Consult Note (Signed)
[facility] for Infectious Disease    Date of Admission:  10/27/2017   Total days of antibiotics: 8        Day 6 ceftriaxone               Reason for Consult: fever   Referring Provider: Karleen Hampshire  Assessment: Fever Pneumonia Seizures Dental pain CKD HyperNatremia  Plan: 1. Stop ceftriaxone 2. Query role of dilantin (or vimpat) in fevers 3. Consider MRI of head 4. Recheck TSH and cortisol 5. Could fever be from seizures?  Comment- suspect drug fever or central fever. His wife questions if this is related to occupational exposure. He does not have microcytosis to suggest plumbism.  I do not see evidence to suggest he has a mold infection (and this is quite rare from occupational exposure).   Principal Problem:   Recurrent seizures ([place]) Active Problems:   Sleep apnea   Obesity hypoventilation syndrome (HCC)   Hypertension   Status epilepticus ([place])   Scheduled Meds: . aspirin  81 mg Oral Daily  . budesonide (PULMICORT) nebulizer solution  0.25 mg Nebulization BID  . chlorhexidine  15 mL Mouth Rinse BID  . free water  200 mL Per Tube Q8H  . heparin injection (subcutaneous)  5,000 Units Subcutaneous Q8H  . insulin aspart  0-20 Units Subcutaneous Q4H  . ipratropium-albuterol  3 mL Nebulization TID  . levothyroxine  25 mcg Intravenous Daily  . mouth rinse  15 mL Mouth Rinse q12n4p  . metoprolol tartrate  5 mg Intravenous Q6H  . pantoprazole sodium  40 mg Per Tube Q24H   Continuous Infusions: . cefTRIAXone (ROCEPHIN)  IV 1 g (11/07/17 0946)  . lacosamide (VIMPAT) IV 200 mg (11/07/17 1056)  . lactated ringers 100 mL/hr at 11/07/17 0002  . levETIRAcetam 1,000 mg (11/07/17 1027)  . phenytoin (DILANTIN) IV     PRN Meds:.acetaminophen **OR** acetaminophen, albuterol, bisacodyl, hydrALAZINE, labetalol, RESOURCE THICKENUP CLEAR  HPI: Daniel [name] is a 52 y.o. male with a significant history of DM, OSA, OHS, and PE who was admitted on 9/6 for 2 days of  chills, fever, then developing altered mental status and seizures. Prior coming to the ED, he also had a fall at home.  Per his wife he fell at home and then developed seizures. His memory has been worsening over the last 2 months.  Since adm, he has been found to have H. Influenza (in sputum Cx, beta-lactamase negative) pneumonia on 9/10. He was started on vanco/zosyn --> ceftriaxone.  Since adm, his mental status has been variable.  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 He continues to have daily fever in hospital.  He had EEG today which did not show ongoing seizure activity.  He has not had regular cough.   Review of Systems: Review of Systems  Unable to perform ROS: Mental status change  Constitutional: Positive for chills, fever and malaise/fatigue.  Neurological: Positive for headaches.    Past Medical History:  Diagnosis Date  . Pneumonia   . Pulmonary embolism ([place])   . Sleep apnea     Social History   Tobacco Use  . Smoking status: Never Smoker  . Smokeless tobacco: Former Systems developer    Types: Chew  Substance Use Topics  . Alcohol use: Never    Frequency: Never  . Drug use: Never    Family History  Problem Relation Age of Onset  . Alpha-1 antitrypsin deficiency Neg Hx   . Asthma Neg Hx   . Breast cancer Neg Hx   . Stroke Neg Hx   . Colon cancer Neg Hx   . Coronary artery disease Neg Hx   . COPD Neg Hx   . Cystic fibrosis Neg Hx   . Diabetes Neg Hx   . Deep vein thrombosis Neg Hx   . Hypertension Neg Hx   . Hyperlipidemia Neg Hx   . Kidney disease Neg Hx   . Liver disease Neg Hx   . Lung cancer Neg Hx   . Lupus Neg Hx   . Neurofibromatosis Neg Hx   . Osteoporosis Neg Hx   . Ovarian cancer Neg Hx   . Positive PPD/TB Exposure Neg Hx   . Prostate cancer Neg Hx   . Pulmonary embolism Neg Hx   . Pulmonary fibrosis Neg Hx   . Rheum arthritis Neg Hx   .  Sarcoidosis Neg Hx   . Sleep apnea Neg Hx   . Thyroid disease Neg Hx   . Tuberculosis Neg Hx   . Tuberous sclerosis Neg Hx    Allergies  Allergen Reactions  . Montelukast Other (See Comments)    Possibly cause headaches per spouse    OBJECTIVE: Blood pressure 121/82, pulse 86, temperature 99.4 F (37.4 C), temperature source Axillary, resp. rate 20, height 5\' 9"  (1.753 m), weight 134.9 kg, SpO2 97 %.  Physical Exam  Constitutional: He appears lethargic. No distress.  Eyes: Pupils are equal, round, and reactive to light. EOM are normal.  Neck: Normal range of motion. Neck supple.  Cardiovascular: Normal rate, regular rhythm and normal heart sounds.  Pulmonary/Chest: Effort normal and breath sounds normal.  Abdominal: Soft. Bowel sounds are normal. He exhibits no distension. There is no tenderness.  Lymphadenopathy:    He has no cervical adenopathy.  Neurological: He appears lethargic. He displays no seizure activity.    Lab Results Lab Results  Component Value Date   WBC 11.0 (H) 11/07/2017   HGB 13.7 11/07/2017   HCT 48.1 11/07/2017   MCV 96.8 11/07/2017   PLT 226 11/07/2017    Lab Results  Component Value Date   CREATININE 2.26 (H) 11/07/2017   BUN 50 (H) 11/07/2017   NA 159 (H) 11/07/2017   K 4.3 11/07/2017   CL 120 (H) 11/07/2017   CO2 24 11/07/2017    Lab Results  Component Value Date   ALT 17 11/05/2017   AST 20 11/05/2017   ALKPHOS 65 11/05/2017   BILITOT 1.0 11/05/2017     Microbiology: Recent Results (from the past 240 hour(s))  CSF culture     Status: None   Collection Time: 10/28/17  1:46 PM  Result Value Ref Range Status   Specimen Description CSF  Final   Special Requests NONE  Final   Gram Stain   Final    WBC PRESENT, PREDOMINANTLY MONONUCLEAR NO ORGANISMS SEEN CYTOSPIN SMEAR    Culture   Final    NO GROWTH 3 DAYS Performed at [facility], [place] [address], [place], [place] [address]    Report Status 10/31/2017 FINAL  Final    Culture, respiratory (non-expectorated)     Status: None   Collection Time: 10/31/17  9:01 AM  Result Value Ref Range Status   Specimen Description TRACHEAL ASPIRATE  Final   Special Requests NONE  Final   Gram Stain   Final  MODERATE WBC PRESENT, PREDOMINANTLY PMN FEW GRAM NEGATIVE RODS RARE GRAM POSITIVE RODS    Culture   Final    MODERATE HAEMOPHILUS INFLUENZAE BETA LACTAMASE NEGATIVE Performed at [facility], [place] [address] [place], [place] [address]    Report Status 11/01/2017 FINAL  Final  Culture, blood (Routine X 2) w Reflex to ID Panel     Status: None   Collection Time: 10/31/17 11:04 AM  Result Value Ref Range Status   Specimen Description BLOOD BLOOD RIGHT HAND  Final   Special Requests   Final    BOTTLES DRAWN AEROBIC ONLY Blood Culture adequate volume   Culture   Final    NO GROWTH 5 DAYS Performed at [facility], [place] [address] [place], [place] [address]    Report Status 11/05/2017 FINAL  Final  Culture, blood (Routine X 2) w Reflex to ID Panel     Status: None   Collection Time: 10/31/17 11:08 AM  Result Value Ref Range Status   Specimen Description BLOOD BLOOD RIGHT HAND  Final   Special Requests   Final    BOTTLES DRAWN AEROBIC ONLY Blood Culture results may not be optimal due to an inadequate volume of blood received in culture bottles   Culture   Final    NO GROWTH 5 DAYS Performed at [facility], [place] [address] [place], [place] [address]    Report Status 11/05/2017 FINAL  Final  C difficile quick scan w PCR reflex     Status: None   Collection Time: 11/02/17  8:30 AM  Result Value Ref Range Status   C Diff antigen NEGATIVE NEGATIVE Final   C Diff toxin NEGATIVE NEGATIVE Final   C Diff interpretation No C. difficile detected.  Final    Comment: Performed at [facility], [place] [address] [place], [place] [address]  Culture, blood (Routine X 2) w Reflex to ID Panel     Status: None (Preliminary result)   Collection  Time: 11/06/17 10:50 AM  Result Value Ref Range Status   Specimen Description BLOOD RIGHT ANTECUBITAL  Final   Special Requests   Final    BOTTLES DRAWN AEROBIC ONLY Blood Culture results may not be optimal due to an inadequate volume of blood received in culture bottles   Culture   Final    NO GROWTH 1 DAY Performed at [facility], [place] [address], [place], [place] [address]    Report Status PENDING  Incomplete  Culture, blood (Routine X 2) w Reflex to ID Panel     Status: None (Preliminary result)   Collection Time: 11/06/17 11:19 AM  Result Value Ref Range Status   Specimen Description BLOOD RIGHT ANTECUBITAL  Final   Special Requests   Final    BOTTLES DRAWN AEROBIC ONLY Blood Culture adequate volume   Culture   Final    NO GROWTH 1 DAY Performed at [facility], [place] [address], [place], [place] [address]    Report Status PENDING  Incomplete    Durenda Hurt, [facility] for Infectious Disease [facility]  11/07/2017, 1:07 PM

## 2017-11-08 DIAGNOSIS — E870 Hyperosmolality and hypernatremia: Secondary | ICD-10-CM

## 2017-11-08 DIAGNOSIS — K0889 Other specified disorders of teeth and supporting structures: Secondary | ICD-10-CM

## 2017-11-08 DIAGNOSIS — J189 Pneumonia, unspecified organism: Secondary | ICD-10-CM

## 2017-11-08 DIAGNOSIS — N189 Chronic kidney disease, unspecified: Secondary | ICD-10-CM

## 2017-11-08 DIAGNOSIS — R569 Unspecified convulsions: Secondary | ICD-10-CM

## 2017-11-08 DIAGNOSIS — R509 Fever, unspecified: Secondary | ICD-10-CM

## 2017-11-08 LAB — GLUCOSE, CAPILLARY
GLUCOSE-CAPILLARY: 175 mg/dL — AB (ref 70–99)
GLUCOSE-CAPILLARY: 248 mg/dL — AB (ref 70–99)
GLUCOSE-CAPILLARY: 259 mg/dL — AB (ref 70–99)
Glucose-Capillary: 146 mg/dL — ABNORMAL HIGH (ref 70–99)
Glucose-Capillary: 244 mg/dL — ABNORMAL HIGH (ref 70–99)
Glucose-Capillary: 214 mg/dL — ABNORMAL HIGH (ref 70–99)
Glucose-Capillary: 236 mg/dL — ABNORMAL HIGH (ref 70–99)

## 2017-11-08 LAB — CBC WITH DIFFERENTIAL/PLATELET
ABS IMMATURE GRANULOCYTES: 0.1 10*3/uL (ref 0.0–0.1)
BASOS PCT: 1 %
Basophils Absolute: 0.1 10*3/uL (ref 0.0–0.1)
EOS ABS: 0.5 10*3/uL (ref 0.0–0.7)
Eosinophils Relative: 6 %
HEMATOCRIT: 44 % (ref 39.0–52.0)
Hemoglobin: 12.5 g/dL — ABNORMAL LOW (ref 13.0–17.0)
IMMATURE GRANULOCYTES: 1 %
LYMPHS ABS: 2.1 10*3/uL (ref 0.7–4.0)
Lymphocytes Relative: 21 %
MCH: 27.7 pg (ref 26.0–34.0)
MCHC: 28.4 g/dL — ABNORMAL LOW (ref 30.0–36.0)
MCV: 97.3 fL (ref 78.0–100.0)
MONOS PCT: 7 %
Monocytes Absolute: 0.6 10*3/uL (ref 0.1–1.0)
NEUTROS ABS: 6.3 10*3/uL (ref 1.7–7.7)
NEUTROS PCT: 64 %
PLATELETS: 229 10*3/uL (ref 150–400)
RBC: 4.52 MIL/uL (ref 4.22–5.81)
RDW: 14.4 % (ref 11.5–15.5)
WBC: 9.7 10*3/uL (ref 4.0–10.5)

## 2017-11-08 LAB — RENAL FUNCTION PANEL
ANION GAP: 7 (ref 5–15)
Albumin: 2.1 g/dL — ABNORMAL LOW (ref 3.5–5.0)
BUN: 45 mg/dL — ABNORMAL HIGH (ref 6–20)
CO2: 28 mmol/L (ref 22–32)
CREATININE: 1.96 mg/dL — AB (ref 0.61–1.24)
Calcium: 8.1 mg/dL — ABNORMAL LOW (ref 8.9–10.3)
Chloride: 122 mmol/L — ABNORMAL HIGH (ref 98–111)
GFR calc Af Amer: 43 mL/min — ABNORMAL LOW (ref >60)
GFR calc non Af Amer: 37 mL/min — ABNORMAL LOW (ref >60)
GLUCOSE: 204 mg/dL — AB (ref 70–99)
Phosphorus: 3.7 mg/dL (ref 2.5–4.6)
Potassium: 3.8 mmol/L (ref 3.5–5.1)
SODIUM: 157 mmol/L — AB (ref 135–145)

## 2017-11-08 LAB — BASIC METABOLIC PANEL
Anion gap: 8 (ref 5–15)
BUN: 39 mg/dL — ABNORMAL HIGH (ref 6–20)
CALCIUM: 7.6 mg/dL — AB (ref 8.9–10.3)
CO2: 24 mmol/L (ref 22–32)
CREATININE: 1.84 mg/dL — AB (ref 0.61–1.24)
Chloride: 122 mmol/L — ABNORMAL HIGH (ref 98–111)
GFR calc Af Amer: 47 mL/min — ABNORMAL LOW (ref >60)
GFR, EST NON AFRICAN AMERICAN: 40 mL/min — AB (ref >60)
GLUCOSE: 290 mg/dL — AB (ref 70–99)
Potassium: 4.9 mmol/L (ref 3.5–5.1)
Sodium: 154 mmol/L — ABNORMAL HIGH (ref 135–145)

## 2017-11-08 LAB — URINE CULTURE: Culture: NO GROWTH

## 2017-11-08 LAB — PHENYTOIN LEVEL, TOTAL: Phenytoin Lvl: 9.7 ug/mL — ABNORMAL LOW (ref 10.0–20.0)

## 2017-11-08 LAB — MAGNESIUM: Magnesium: 2.3 mg/dL (ref 1.7–2.4)

## 2017-11-08 LAB — CK: Total CK: 271 U/L (ref 49–397)

## 2017-11-08 MED ORDER — LEVOTHYROXINE SODIUM 50 MCG PO TABS
50.0000 ug | ORAL_TABLET | Freq: Every day | ORAL | Status: DC
  Administered 2017-11-08 – 2017-11-22 (×16): 50 ug via ORAL
  Filled 2017-11-08 (×17): qty 1

## 2017-11-08 MED ORDER — HYDRALAZINE HCL 50 MG PO TABS
50.0000 mg | ORAL_TABLET | Freq: Three times a day (TID) | ORAL | Status: DC
  Administered 2017-11-08 – 2017-11-13 (×15): 50 mg via ORAL
  Filled 2017-11-08 (×14): qty 1

## 2017-11-08 MED ORDER — METOPROLOL SUCCINATE ER 25 MG PO TB24
50.0000 mg | ORAL_TABLET | Freq: Every day | ORAL | Status: DC
  Administered 2017-11-08 – 2017-11-22 (×15): 50 mg via ORAL
  Filled 2017-11-08 (×15): qty 2

## 2017-11-08 MED ORDER — INSULIN GLARGINE 100 UNIT/ML ~~LOC~~ SOLN
10.0000 [IU] | Freq: Every day | SUBCUTANEOUS | Status: DC
  Administered 2017-11-08 – 2017-11-16 (×9): 10 [IU] via SUBCUTANEOUS
  Filled 2017-11-08 (×10): qty 0.1

## 2017-11-08 NOTE — Progress Notes (Signed)
Family at bedside. Patient awake and oriented. Had 2 orange shelbet and 1 cup of  thickened apple juice. Tolereated well

## 2017-11-08 NOTE — Progress Notes (Signed)
INFECTIOUS DISEASE PROGRESS NOTE  ID: Daniel [name] is a 52 y.o. male with  Principal Problem:   Recurrent seizures ([place]) Active Problems:   Sleep apnea   Obesity hypoventilation syndrome ([place])   Hypertension   Status epilepticus ([place])  Subjective: No complaints.   Abtx:  Anti-infectives (From admission, onward)   Start     Dose/Rate Route Frequency Ordered Stop   11/02/17 0900  cefTRIAXone (ROCEPHIN) 1 g in sodium chloride 0.9 % 100 mL IVPB  Status:  Discontinued     1 g 200 mL/hr over 30 Minutes Intravenous Every 24 hours 11/02/17 0850 11/07/17 1411   11/01/17 1000  vancomycin (VANCOCIN) 1,750 mg in sodium chloride 0.9 % 500 mL IVPB  Status:  Discontinued     1,750 mg 250 mL/hr over 120 Minutes Intravenous Every 24 hours 10/31/17 0932 11/02/17 0851   10/31/17 1000  piperacillin-tazobactam (ZOSYN) IVPB 3.375 g  Status:  Discontinued     3.375 g 12.5 mL/hr over 240 Minutes Intravenous Every 8 hours 10/31/17 0928 11/02/17 0850   10/31/17 1000  vancomycin (VANCOCIN) 2,000 mg in sodium chloride 0.9 % 500 mL IVPB     2,000 mg 250 mL/hr over 120 Minutes Intravenous  Once 10/31/17 0932 10/31/17 1334      Medications:  Scheduled: . aspirin  81 mg Oral Daily  . budesonide (PULMICORT) nebulizer solution  0.25 mg Nebulization BID  . chlorhexidine  15 mL Mouth Rinse BID  . heparin injection (subcutaneous)  5,000 Units Subcutaneous Q8H  . insulin aspart  0-20 Units Subcutaneous Q4H  . ipratropium-albuterol  3 mL Nebulization BID  . levothyroxine  50 mcg Oral QAC breakfast  . mouth rinse  15 mL Mouth Rinse q12n4p  . metoprolol tartrate  5 mg Intravenous Q6H  . pantoprazole sodium  40 mg Per Tube Q24H    Objective: Vital signs in last 24 hours: Temp:  [98.8 F (37.1 C)-101.9 F (38.8 C)] 98.8 F (37.1 C) (09/18 1135) Pulse Rate:  [74-86] 81 (09/18 1135) Resp:  [16-21] 17 (09/18 1135) BP: (121-179)/(50-87) 159/86 (09/18 1135) SpO2:  [94 %-100 %] 100 % (09/18 1135) Weight:   [138.9 kg] 138.9 kg (09/18 0449)   General appearance: alert, cooperative, no distress and diaphoretic Resp: clear to auscultation bilaterally Cardio: regular rate and rhythm GI: normal findings: bowel sounds normal and soft, non-tender  Lab Results Recent Labs    11/07/17 0626 11/08/17 0403  WBC 11.0* 9.7  HGB 13.7 12.5*  HCT 48.1 44.0  NA 159* 157*  K 4.3 3.8  CL 120* 122*  CO2 24 28  BUN 50* 45*  CREATININE 2.26* 1.96*   Liver Panel Recent Labs    11/08/17 0403  ALBUMIN 2.1*   Sedimentation Rate No results for input(s): ESRSEDRATE in the last 72 hours. C-Reactive Protein No results for input(s): CRP in the last 72 hours.  Microbiology: Recent Results (from the past 240 hour(s))  Culture, respiratory (non-expectorated)     Status: None   Collection Time: 10/31/17  9:01 AM  Result Value Ref Range Status   Specimen Description TRACHEAL ASPIRATE  Final   Special Requests NONE  Final   Gram Stain   Final    MODERATE WBC PRESENT, PREDOMINANTLY PMN FEW GRAM NEGATIVE RODS RARE GRAM POSITIVE RODS    Culture   Final    MODERATE HAEMOPHILUS INFLUENZAE BETA LACTAMASE NEGATIVE Performed at [facility], [address], [place], [place] [address]    Report Status 11/01/2017  FINAL  Final  Culture, blood (Routine X 2) w Reflex to ID Panel     Status: None   Collection Time: 10/31/17 11:04 AM  Result Value Ref Range Status   Specimen Description BLOOD BLOOD RIGHT HAND  Final   Special Requests   Final    BOTTLES DRAWN AEROBIC ONLY Blood Culture adequate volume   Culture   Final    NO GROWTH 5 DAYS Performed at [facility], [place] [address], [place], [place] [address]    Report Status 11/05/2017 FINAL  Final  Culture, blood (Routine X 2) w Reflex to ID Panel     Status: None   Collection Time: 10/31/17 11:08 AM  Result Value Ref Range Status   Specimen Description BLOOD BLOOD RIGHT HAND  Final   Special Requests   Final    BOTTLES DRAWN AEROBIC ONLY  Blood Culture results may not be optimal due to an inadequate volume of blood received in culture bottles   Culture   Final    NO GROWTH 5 DAYS Performed at [facility], [place] [address], [place], [place] [address]    Report Status 11/05/2017 FINAL  Final  C difficile quick scan w PCR reflex     Status: None   Collection Time: 11/02/17  8:30 AM  Result Value Ref Range Status   C Diff antigen NEGATIVE NEGATIVE Final   C Diff toxin NEGATIVE NEGATIVE Final   C Diff interpretation No C. difficile detected.  Final    Comment: Performed at [facility], [place] [address] [place], [place] [address]  Culture, blood (Routine X 2) w Reflex to ID Panel     Status: None (Preliminary result)   Collection Time: 11/06/17 10:50 AM  Result Value Ref Range Status   Specimen Description BLOOD RIGHT ANTECUBITAL  Final   Special Requests   Final    BOTTLES DRAWN AEROBIC ONLY Blood Culture results may not be optimal due to an inadequate volume of blood received in culture bottles   Culture   Final    NO GROWTH 2 DAYS Performed at [facility], [place] [address], [place], [place] [address]    Report Status PENDING  Incomplete  Culture, blood (Routine X 2) w Reflex to ID Panel     Status: None (Preliminary result)   Collection Time: 11/06/17 11:19 AM  Result Value Ref Range Status   Specimen Description BLOOD RIGHT ANTECUBITAL  Final   Special Requests   Final    BOTTLES DRAWN AEROBIC ONLY Blood Culture adequate volume   Culture   Final    NO GROWTH 2 DAYS Performed at [facility], [place] [address] [place], [place] [address]    Report Status PENDING  Incomplete  Urine Culture     Status: None   Collection Time: 11/06/17  6:52 PM  Result Value Ref Range Status   Specimen Description URINE, CATHETERIZED  Final   Special Requests NONE  Final   Culture   Final    NO GROWTH Performed at [facility], [address] [place], [place] [address]    Report Status 11/08/2017 FINAL   Final    Studies/Results: Ct Head Wo Contrast  Result Date: 11/06/2017 CLINICAL DATA:  52 y/o M; postictal, altered mental status, recent fall before admission, history of seizures. EXAM: CT HEAD WITHOUT CONTRAST TECHNIQUE: Contiguous axial images were obtained from the base of the skull through the vertex without intravenous contrast.  COMPARISON:  10/25/2017 MRI head.  10/24/2017 CT head. FINDINGS: Brain: No evidence of acute infarction, hemorrhage, hydrocephalus, extra-axial collection or mass lesion/mass effect. 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 No hyperdense vessel. Skull: Normal. Negative for fracture or focal lesion. Sinuses/Orbits: Mild right posterior ethmoid air cell mucosal thickening. Additional visible paranasal sinuses and the mastoid air cells are normally aerated. Orbits are unremarkable. Other: None. IMPRESSION: 1. No acute intracranial abnormality. 2. 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 Electronically Signed   By: Kristine Garbe M.D.   On: 11/06/2017 21:52   US Renal  Result Date: 11/07/2017 CLINICAL DATA:  Acute renal failure EXAM: RENAL / URINARY TRACT ULTRASOUND COMPLETE COMPARISON:  Ultrasound 06/25/2016 FINDINGS: Right Kidney: Length: 12.9 cm. Echogenicity within normal limits. No mass or hydronephrosis visualized. Left Kidney: Length: 14 cm. Echogenicity within normal limits. No mass or hydronephrosis visualized. Bladder: Appears normal for degree of bladder distention. Incidental note made of slightly echogenic liver. IMPRESSION: Negative renal ultrasound Electronically Signed   By: Donavan Foil M.D.   On: 11/07/2017 21:15     Assessment/Plan: Fever Pneumonia Seizures Dental pain CKD HyperNatremia   Total days of antibiotics: 8 stopped 9-17        Fever improved over last 24h.  Cortisol and TSH normal.    Will watch peripherally.     Bobby Rumpf MD, FACP Infectious Diseases (pager) [phone] [email or web address] 11/08/2017, 11:40 AM  LOS: 12 days

## 2017-11-08 NOTE — Progress Notes (Signed)
Inpatient Diabetes Program Recommendations  AACE/ADA: New Consensus Statement on Inpatient Glycemic Control (2015)  Target Ranges:  Prepandial:   less than 140 mg/dL      Peak postprandial:   less than 180 mg/dL (1-2 hours)      Critically ill patients:  140 - 180 mg/dL   Lab Results  Component Value Date   GLUCAP 146 (H) 11/08/2017   HGBA1C 15.8 (H) 10/28/2017    Review of Glycemic ControlResults for CAM, DAUPHIN (MRN [id number]) as of 11/08/2017 10:56  Ref. Range 11/07/2017 16:43 11/07/2017 19:59 11/08/2017 00:06 11/08/2017 03:52 11/08/2017 07:32  Glucose-Capillary Latest Ref Range: 70 - 99 mg/dL 173 (H) 160 (H) 259 (H) 175 (H) 146 (H)   Diabetes history: Type 2 DM  Outpatient Diabetes medications: Levemir 120 units daily, Metformin 500 mg bid Current orders for Inpatient glycemic control:  Novolog resistant q 4 hours Inpatient Diabetes Program Recommendations:   -Consider reducing Novolog correction frequency to tid with meals and HS scale (Use Glycemic control order set AC&HS) -May consider adding Levemir 15 units daily.   Thanks,  Adah Perl, RN, BC-ADM Inpatient Diabetes Coordinator Pager [phone] (8a-5p)

## 2017-11-08 NOTE — Progress Notes (Signed)
S:Pt more awake, alert, and verbal today.  "I'm thirsty".  Parents at bedside. O:BP (!) 159/86 (BP Location: Right Arm)   Pulse 81   Temp 98.8 F (37.1 C) (Oral)   Resp 17   Ht 5\' 9"  (1.753 m)   Wt (!) 138.9 kg   SpO2 100%   BMI 45.22 kg/m   Intake/Output Summary (Last 24 hours) at 11/08/2017 1146 Last data filed at 11/08/2017 0800 Gross per 24 hour  Intake 2861.88 ml  Output 600 ml  Net 2261.88 ml   Intake/Output: I/O last 3 completed shifts: In: 2278.2 [I.V.:1698.2; IV Piggyback:580] Out: 600 [Urine:600]  Intake/Output this shift:  Total I/O In: 583.7 [I.V.:583.7] Out: -  Weight change: 4 kg Gen: lethargic but arrousable and appropriate CVS:no rub Resp: occ rhonchi Abd: +BS, soft,NT Ext:1+ edema of left hand, trace lower ext edema  Recent Labs  Lab 11/01/17 1700  11/03/17 0500 11/04/17 0448 11/05/17 0017 11/06/17 0452 11/06/17 2149 11/07/17 0626 11/08/17 0403  NA  --    < > 149* 153* 157* 157* 158* 159* 157*  K  --    < > 3.1* 3.0* 3.3* 3.5 3.7 4.3 3.8  CL  --    < > 114* 117* 120* 120* 120* 120* 122*  CO2  --    < > 24 27 24 25 25 24 28   GLUCOSE  --    < > 242* 181* 231* 209* 268* 201* 204*  BUN  --    < > 55* 46* 51* 47* 46* 50* 45*  CREATININE  --    < > 1.57* 1.28* 1.60* 1.79* 1.98* 2.26* 1.96*  ALBUMIN  --   --   --   --  2.3*  --   --   --  2.1*  CALCIUM  --    < > 8.9 9.2 9.1 8.7* 8.6* 8.6* 8.1*  PHOS 4.6  --   --   --  3.5 4.1  --  5.5* 3.7  AST  --   --   --   --  20  --   --   --   --   ALT  --   --   --   --  17  --   --   --   --    < > = values in this interval not displayed.   Liver Function Tests: Recent Labs  Lab 11/05/17 0017 11/08/17 0403  AST 20  --   ALT 17  --   ALKPHOS 65  --   BILITOT 1.0  --   PROT 6.2*  --   ALBUMIN 2.3* 2.1*   No results for input(s): LIPASE, AMYLASE in the last 168 hours. Recent Labs  Lab 11/07/17 1023  AMMONIA 37*   CBC: Recent Labs  Lab 11/04/17 0448  11/05/17 0017 11/06/17 0452  11/07/17 0626 11/08/17 0403  WBC 9.8  --  9.7 8.4 11.0* 9.7  NEUTROABS  --    < > 6.3 4.6 7.0 6.3  HGB 13.9  --  13.7 14.2 13.7 12.5*  HCT 46.1  --  45.8 48.9 48.1 44.0  MCV 92.2  --  92.5 94.4 96.8 97.3  PLT 345  --  377 342 226 229   < > = values in this interval not displayed.   Cardiac Enzymes: Recent Labs  Lab 11/07/17 0626  CKTOTAL 125   CBG: Recent Labs  Lab 11/07/17 1959 11/08/17 0006 11/08/17 0352 11/08/17 0732 11/08/17 1133  GLUCAP  160* 259* 175* 146* 248*    Iron Studies: No results for input(s): IRON, TIBC, TRANSFERRIN, FERRITIN in the last 72 hours. Studies/Results: Ct Head Wo Contrast  Result Date: 11/06/2017 CLINICAL DATA:  52 y/o M; postictal, altered mental status, recent fall before admission, history of seizures. EXAM: CT HEAD WITHOUT CONTRAST TECHNIQUE: Contiguous axial images were obtained from the base of the skull through the vertex without intravenous contrast. COMPARISON:  10/25/2017 MRI head.  10/24/2017 CT head. FINDINGS: Brain: No evidence of acute infarction, hemorrhage, hydrocephalus, extra-axial collection or mass lesion/mass effect. 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 No hyperdense vessel. Skull: Normal. Negative for fracture or focal lesion. Sinuses/Orbits: Mild right posterior ethmoid air cell mucosal thickening. Additional visible paranasal sinuses and the mastoid air cells are normally aerated. Orbits are unremarkable. Other: None. IMPRESSION: 1. No acute intracranial abnormality. 2. 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 Electronically Signed   By: Kristine Garbe M.D.   On: 11/06/2017 21:52   US Renal  Result Date: 11/07/2017 CLINICAL DATA:  Acute renal failure EXAM: RENAL / URINARY TRACT ULTRASOUND COMPLETE COMPARISON:  Ultrasound 06/25/2016 FINDINGS: Right Kidney: Length: 12.9 cm.  Echogenicity within normal limits. No mass or hydronephrosis visualized. Left Kidney: Length: 14 cm. Echogenicity within normal limits. No mass or hydronephrosis visualized. Bladder: Appears normal for degree of bladder distention. Incidental note made of slightly echogenic liver. IMPRESSION: Negative renal ultrasound Electronically Signed   By: Donavan Foil M.D.   On: 11/07/2017 21:15   . aspirin  81 mg Oral Daily  . budesonide (PULMICORT) nebulizer solution  0.25 mg Nebulization BID  . chlorhexidine  15 mL Mouth Rinse BID  . heparin injection (subcutaneous)  5,000 Units Subcutaneous Q8H  . insulin aspart  0-20 Units Subcutaneous Q4H  . ipratropium-albuterol  3 mL Nebulization BID  . levothyroxine  50 mcg Oral QAC breakfast  . mouth rinse  15 mL Mouth Rinse q12n4p  . metoprolol tartrate  5 mg Intravenous Q6H  . pantoprazole sodium  40 mg Per Tube Q24H    BMET    Component Value Date/Time   NA 157 (H) 11/08/2017 0403   K 3.8 11/08/2017 0403   CL 122 (H) 11/08/2017 0403   CO2 28 11/08/2017 0403   GLUCOSE 204 (H) 11/08/2017 0403   BUN 45 (H) 11/08/2017 0403   CREATININE 1.96 (H) 11/08/2017 0403   CALCIUM 8.1 (L) 11/08/2017 0403   GFRNONAA 37 (L) 11/08/2017 0403   GFRAA 43 (L) 11/08/2017 0403   CBC    Component Value Date/Time   WBC 9.7 11/08/2017 0403   RBC 4.52 11/08/2017 0403   HGB 12.5 (L) 11/08/2017 0403   HCT 44.0 11/08/2017 0403   PLT 229 11/08/2017 0403   MCV 97.3 11/08/2017 0403   MCH 27.7 11/08/2017 0403   MCHC 28.4 (L) 11/08/2017 0403   RDW 14.4 11/08/2017 0403   LYMPHSABS 2.1 11/08/2017 0403   MONOABS 0.6 11/08/2017 0403   EOSABS 0.5 11/08/2017 0403   BASOSABS 0.1 11/08/2017 0403    Assessment/Plan: 1.  AKI/CKD stage 3- in setting of status epilepticus and volume depletion.  Will stop lactated ringers and start 1/2 NS due to hypernatremia.  His bladder scan had >300 cc's and nurse instructed to place I&O cath but may need to have foley replaced.  Renal US  pending as well as urine studies.  Continue to hold lisinopril and avoid nephrotoxic agents.  2. Hypernatremia- free water deficit is 11 liters.  Started on 1/2 NS at 100 ml/hr but increase rate to 150.  Will recheck sodium level now and again in am.  Encourage po intake now that he is awake and alert (thickened liquids).  3. Refractory partial right occipital seizures- Neuro following and has pt on fosphenytoin, phenytoin, keppra and vimpat.  Also stressing glucose control.  Currently responsive and Neuro following.  4. Hypertensive urgency- improved with medications.  Ok to switch to po meds now that he is awake (was on hydralazine 100mg  tid, lisinopril 10mg  and demadex 20.  Would hold demadex and lisinopril for now and could start po metoprolol 50mg  ER daily) 5. DM- per primary svc 6. OSA on CPAP per Primary svc  Donetta Potts, MD [facility] [phone]

## 2017-11-08 NOTE — Progress Notes (Signed)
  Speech Language Pathology Treatment: Dysphagia  Patient Details Name: Daniel [name] MRN: [id number] DOB: 1965/12/11 Today's Date: 11/08/2017 Time: 0810-0830 SLP Time Calculation (min) (ACUTE ONLY): 20 min  Assessment / Plan / Recommendation Clinical Impression  Pt demonstrates significantly improved arousal today. Able to attend to am meal with mod verbal cues to make choices, initiate. Hand over hand assist with cups and spoons. Mild oral residue noted with dys 2 texture, cleared with liquid wash. No signs of aspiration noted. Will continue current diet until more consistent tolerance observed for potential to upgrade.   HPI HPI: 52 yo male presented to [place] with altered mental status, confusion, headaches, hallucinations, and falls.  SBP was over 200.  In ER at [facility] he was started on esmolol gtt.  Developed seizure in [place] at [place].  He continued to have seizures and transferred to [facility].  He was started on burst suppression with versed and required intubation 9/07.  Developed fever and increased sputum 9/10. Extubated 9/13.       SLP Plan  Continue with current plan of care       Recommendations  Diet recommendations: Dysphagia 2 (fine chop);Honey-thick liquid Liquids provided via: Cup Medication Administration: Crushed with puree Supervision: Staff to assist with self feeding Compensations: Slow rate;Small sips/bites Postural Changes and/or Swallow Maneuvers: Seated upright 90 degrees                Follow up Recommendations: Skilled Nursing facility SLP Visit Diagnosis: Dysphagia, oropharyngeal phase (R13.12) Plan: Continue with current plan of care       GO               [facility], [place] CCC-SLP [phone]  Lynann Beaver 11/08/2017, 3:53 PM

## 2017-11-08 NOTE — Progress Notes (Signed)
PROGRESS NOTE  Daniel [name]  VZD:[id number] DOB: Jan 03, 1966 DOA: 10/27/2017 PCP: Antonietta Jewel, MD   Brief Narrative: Daniel [name] is a 52 y.o. male with a history of seizure disorder, IDDM, OSA, OHS, morbid obesity, HTN, IDDM, and PE who presented to [facility] with confusion, headache, hallucinations, and falls found to have hypertensive urgency, started on esmolol gtt, and developed seizures in the ED. He was admitted to [facility] and intubated 9/7. Developed increasing secretions found to have Hemophilus pneumonia, treated with antibiotics, ultimately extubated 9/13.    Assessment & Plan: Principal Problem:   Recurrent seizures ([place]) Active Problems:   Sleep apnea   Obesity hypoventilation syndrome (HCC)   Hypertension   Status epilepticus ([place])   Fever  Status epilepticus, thought to be hyperglycemia-induced:  - Continue AEDs per neurology - vimpat, dilantin, keppra.   Poorly responsive episodes: Somewhat recurrent today but no respiratory compromise noted. No seizure activity noted by family at bedside.  - Continue close monitoring in SDU   Acute encephalopathy:  - ?Sedation by AEDs. ABG has ruled out hypercarbia. - Continue treatment of hypernatremia per nephrology - Mobilize as able. PT/OT/SLP  Hypernatremia:  - Increased rate of 1/2NS - Monitor in AM  Acute hypoxic respiratory failure: Ongoing poor respiratory status due to OSA, OHS, morbid obesity, obstructive lung disease, CAP. - Completed antibiotics 9/17.  - CPAP qHS  Fever:  - ID consulted, feel this is more likely drug-related or central fever. Continue monitoring. Cortisol, TSH wnl.   IDDM: Uncontrolled with hyperglycemia. HbA1c 15.8%. So his AVERAGE blood sugar has been >400mg /dl. - Home lantus dose is 120 units, so suspect a lot of resistance due to obesity vs. incomplete adherence given insulin requirements here.  - Continue resistant SSI, start lantus 10u.  Hypertensive emergency: Improved.  - Continue BP  medications.   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  - Nephrology consulted, on hypotonic fluids - Hold ACE - Continue foley  Hypothyroidism: TSH 1.415 - Continue synthroid.  Diarrhea:  - Negative CDiff  Left cephalic vein SVT:  - Warm compresses. No DVT.  DVT prophylaxis: Heparin Code Status: Full Family Communication: Mother at bedside Disposition Plan: Uncertain  Consultants:   Neurology  PCCM  Infectious disease  Nephrology  Procedures:  EEG 9/07 >> focal non convulsive status epilepticus from Rt occipital region LP 9/08 >> RBC 99, WBC 4, glucose 104, protein 81  Cultures/Antimicrobials:  CSF 9/07 >> negative  Blood 9/10 >> negative  Sputum 9/10 >>Haemophilus influenzae  C diff PCR 9/12 >>negative   Vancomycin 9/10 -9/12  Zosyn 9/10 -9/12  Rocephin 9/12 - 9/17  Subjective: Lethargic but rousable to loud voice and tactile stim. Does not say anything, shakes head appropriately, denies pain. Has been more lethargic today than yesterday per his mother. She says he seemed to be referring to people that weren't there earlier.  Objective: Vitals:   11/08/17 0449 11/08/17 0736 11/08/17 0752 11/08/17 1135  BP:  (!) 179/87  (!) 159/86  Pulse:  74  81  Resp:  16  17  Temp:  99.8 F (37.7 C)  98.8 F (37.1 C)  TempSrc:  Axillary  Oral  SpO2:  96% 94% 100%  Weight: (!) 138.9 kg     Height:        Intake/Output Summary (Last 24 hours) at  11/08/2017 1405 Last data filed at 11/08/2017 0800 Gross per 24 hour  Intake 2861.88 ml  Output 600 ml  Net 2261.88 ml   Filed Weights   11/06/17 0500 11/07/17 0500 11/08/17 0449  Weight: (!) 136.3 kg 134.9 kg (!) 138.9 kg    Gen: Obese male in no distress Pulm: Non-labored breathing supplemental oxygen. Clear to auscultation bilaterally.  CV:  Regular rate and rhythm. No murmur, rub, or gallop. No JVD, no pedal edema. GI: Abdomen soft, non-tender, non-distended, with normoactive bowel sounds. No organomegaly or masses felt. Ext: Warm, no deformities Skin: No rashes, lesions or ulcers Neuro: Lethargic but rousable, nonverbal and not cooperative with exam. Does not spontaneously move any extremities.  Psych: UTD.  Data Reviewed: I have personally reviewed following labs and imaging studies  CBC: Recent Labs  Lab 11/04/17 0448 11/05/17 0017 11/06/17 0452 11/07/17 0626 11/08/17 0403  WBC 9.8 9.7 8.4 11.0* 9.7  NEUTROABS  --  6.3 4.6 7.0 6.3  HGB 13.9 13.7 14.2 13.7 12.5*  HCT 46.1 45.8 48.9 48.1 44.0  MCV 92.2 92.5 94.4 96.8 97.3  PLT 345 377 342 226 509   Basic Metabolic Panel: Recent Labs  Lab 11/01/17 1700  11/04/17 0448 11/05/17 0017 11/06/17 0452 11/06/17 2149 11/07/17 0626 11/08/17 0403 11/08/17 1209  NA  --    < > 153* 157* 157* 158* 159* 157* 154*  K  --    < > 3.0* 3.3* 3.5 3.7 4.3 3.8 4.9  CL  --    < > 117* 120* 120* 120* 120* 122* 122*  CO2  --    < > 27 24 25 25 24 28 24   GLUCOSE  --    < > 181* 231* 209* 268* 201* 204* 290*  BUN  --    < > 46* 51* 47* 46* 50* 45* 39*  CREATININE  --    < > 1.28* 1.60* 1.79* 1.98* 2.26* 1.96* 1.84*  CALCIUM  --    < > 9.2 9.1 8.7* 8.6* 8.6* 8.1* 7.6*  MG 2.0  --  2.2 2.1 2.3  --  2.1 2.3  --   PHOS 4.6  --   --  3.5 4.1  --  5.5* 3.7  --    < > = values in this interval not displayed.   GFR: Estimated Creatinine Clearance: 64.4 mL/min (A) (by C-G formula based on SCr of 1.84 mg/dL (H)). Liver Function Tests: Recent Labs  Lab 11/05/17 0017 11/08/17 0403  AST 20  --   ALT 17  --   ALKPHOS 65  --   BILITOT 1.0  --   PROT 6.2*  --   ALBUMIN 2.3* 2.1*   No results for input(s): LIPASE, AMYLASE in the last 168 hours. Recent Labs  Lab 11/07/17 1023  AMMONIA 37*   Coagulation Profile: No results for input(s): INR, PROTIME in the last 168 hours. Cardiac  Enzymes: Recent Labs  Lab 11/07/17 0626  CKTOTAL 125   BNP (last 3 results) No results for input(s): PROBNP in the last 8760 hours. HbA1C: No results for input(s): HGBA1C in the last 72 hours. CBG: Recent Labs  Lab 11/07/17 1959 11/08/17 0006 11/08/17 0352 11/08/17 0732 11/08/17 1133  GLUCAP 160* 259* 175* 146* 248*   Lipid Profile: No results for input(s): CHOL, HDL, LDLCALC, TRIG, CHOLHDL, LDLDIRECT in the last 72 hours. Thyroid Function Tests: Recent Labs    11/07/17 1937  TSH 1.415   Anemia Panel: No results for input(s): VITAMINB12,  FOLATE, FERRITIN, TIBC, IRON, RETICCTPCT in the last 72 hours. Urine analysis:    Component Value Date/Time   COLORURINE YELLOW 11/07/2017 1809   APPEARANCEUR HAZY (A) 11/07/2017 1809   LABSPEC 1.020 11/07/2017 1809   PHURINE 5.0 11/07/2017 1809   GLUCOSEU 50 (A) 11/07/2017 1809   HGBUR MODERATE (A) 11/07/2017 1809   BILIRUBINUR NEGATIVE 11/07/2017 1809   KETONESUR NEGATIVE 11/07/2017 1809   PROTEINUR 100 (A) 11/07/2017 1809   NITRITE NEGATIVE 11/07/2017 1809   LEUKOCYTESUR NEGATIVE 11/07/2017 1809   Recent Results (from the past 240 hour(s))  Culture, respiratory (non-expectorated)     Status: None   Collection Time: 10/31/17  9:01 AM  Result Value Ref Range Status   Specimen Description TRACHEAL ASPIRATE  Final   Special Requests NONE  Final   Gram Stain   Final    MODERATE WBC PRESENT, PREDOMINANTLY PMN FEW GRAM NEGATIVE RODS RARE GRAM POSITIVE RODS    Culture   Final    MODERATE HAEMOPHILUS INFLUENZAE BETA LACTAMASE NEGATIVE Performed at [facility], [place] [address] [place], [place] [address]    Report Status 11/01/2017 FINAL  Final  Culture, blood (Routine X 2) w Reflex to ID Panel     Status: None   Collection Time: 10/31/17 11:04 AM  Result Value Ref Range Status   Specimen Description BLOOD BLOOD RIGHT HAND  Final   Special Requests   Final    BOTTLES DRAWN AEROBIC ONLY Blood Culture adequate volume    Culture   Final    NO GROWTH 5 DAYS Performed at [facility], [place] [address], [place], [place] [address]    Report Status 11/05/2017 FINAL  Final  Culture, blood (Routine X 2) w Reflex to ID Panel     Status: None   Collection Time: 10/31/17 11:08 AM  Result Value Ref Range Status   Specimen Description BLOOD BLOOD RIGHT HAND  Final   Special Requests   Final    BOTTLES DRAWN AEROBIC ONLY Blood Culture results may not be optimal due to an inadequate volume of blood received in culture bottles   Culture   Final    NO GROWTH 5 DAYS Performed at [facility], [place] [address] [place], [place] [address]    Report Status 11/05/2017 FINAL  Final  C difficile quick scan w PCR reflex     Status: None   Collection Time: 11/02/17  8:30 AM  Result Value Ref Range Status   C Diff antigen NEGATIVE NEGATIVE Final   C Diff toxin NEGATIVE NEGATIVE Final   C Diff interpretation No C. difficile detected.  Final    Comment: Performed at [facility], [place] [address], [place], [place] [address]  Culture, blood (Routine X 2) w Reflex to ID Panel     Status: None (Preliminary result)   Collection Time: 11/06/17 10:50 AM  Result Value Ref Range Status   Specimen Description BLOOD RIGHT ANTECUBITAL  Final   Special Requests   Final    BOTTLES DRAWN AEROBIC ONLY Blood Culture results may not be optimal due to an inadequate volume of blood received in culture bottles   Culture   Final    NO GROWTH 2 DAYS Performed at [facility], [place] [address] [place], [place] [address]    Report Status PENDING  Incomplete  Culture, blood (Routine X 2) w Reflex to ID Panel     Status: None (Preliminary result)   Collection Time:  11/06/17 11:19 AM  Result Value Ref Range Status   Specimen Description BLOOD RIGHT ANTECUBITAL  Final   Special Requests   Final    BOTTLES DRAWN AEROBIC ONLY Blood Culture adequate volume   Culture   Final    NO GROWTH 2 DAYS Performed at [facility],  [address], [place], [place] [address]    Report Status PENDING  Incomplete  Urine Culture     Status: None   Collection Time: 11/06/17  6:52 PM  Result Value Ref Range Status   Specimen Description URINE, CATHETERIZED  Final   Special Requests NONE  Final   Culture   Final    NO GROWTH Performed at [facility], [address], [place], [place] [address]    Report Status 11/08/2017 FINAL  Final      Radiology Studies: Ct Head Wo Contrast  Result Date: 11/06/2017 CLINICAL DATA:  52 y/o M; postictal, altered mental status, recent fall before admission, history of seizures. EXAM: CT HEAD WITHOUT CONTRAST TECHNIQUE: Contiguous axial images were obtained from the base of the skull through the vertex without intravenous contrast. COMPARISON:  10/25/2017 MRI head.  10/24/2017 CT head. FINDINGS: Brain: No evidence of acute infarction, hemorrhage, hydrocephalus, extra-axial collection or mass lesion/mass effect. 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 No hyperdense vessel. Skull: Normal. Negative for fracture or focal lesion. Sinuses/Orbits: Mild right posterior ethmoid air cell mucosal thickening. Additional visible paranasal sinuses and the mastoid air cells are normally aerated. Orbits are unremarkable. Other: None. IMPRESSION: 1. No acute intracranial abnormality. 2. 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 Electronically Signed   By: Kristine Garbe M.D.   On: 11/06/2017 21:52   US Renal  Result Date: 11/07/2017 CLINICAL DATA:  Acute renal failure EXAM: RENAL / URINARY TRACT ULTRASOUND COMPLETE COMPARISON:  Ultrasound 06/25/2016 FINDINGS: Right Kidney: Length: 12.9 cm. Echogenicity within normal limits. No mass or hydronephrosis visualized. Left Kidney: Length: 14 cm. Echogenicity within normal limits. No mass or hydronephrosis visualized.  Bladder: Appears normal for degree of bladder distention. Incidental note made of slightly echogenic liver. IMPRESSION: Negative renal ultrasound Electronically Signed   By: Donavan Foil M.D.   On: 11/07/2017 21:15    Scheduled Meds: . aspirin  81 mg Oral Daily  . budesonide (PULMICORT) nebulizer solution  0.25 mg Nebulization BID  . chlorhexidine  15 mL Mouth Rinse BID  . heparin injection (subcutaneous)  5,000 Units Subcutaneous Q8H  . insulin aspart  0-20 Units Subcutaneous Q4H  . ipratropium-albuterol  3 mL Nebulization BID  . levothyroxine  50 mcg Oral QAC breakfast  . mouth rinse  15 mL Mouth Rinse q12n4p  . metoprolol tartrate  5 mg Intravenous Q6H  . pantoprazole sodium  40 mg Per Tube Q24H   Continuous Infusions: . sodium chloride 150 mL/hr at 11/08/17 1353  . lacosamide (VIMPAT) IV 200 mg (11/08/17 1012)  . levETIRAcetam 1,000 mg (11/08/17 1014)  . phenytoin (DILANTIN) IV 175 mg (11/08/17 1355)     LOS: 12 days   Time spent: 25 minutes.  Patrecia Pour, MD [facility] [email or web address] Password [email or web address] 11/08/2017, 2:05 PM

## 2017-11-08 NOTE — Progress Notes (Addendum)
Subjective: patient doing much better today, had breakfast. Alert and oriented, dysarthria has also improved.    ROS: negative except above   Examination  Vital signs in last 24 hours: Temp:  [99.4 F (37.4 C)-101.9 F (38.8 C)] 99.8 F (37.7 C) (09/18 0736) Pulse Rate:  [74-86] 74 (09/18 0736) Resp:  [16-21] 16 (09/18 0736) BP: (121-179)/(50-87) 179/87 (09/18 0736) SpO2:  [94 %-100 %] 94 % (09/18 0752) Weight:  [138.9 kg] 138.9 kg (09/18 0449)  General: Not in distress, cooperative CVS: pulse-normal rate and rhythm RS: breathing comfortably Extremities: normal   Neuro: MS: Alert, oriented, follows commands Speech; dysarthric CN: pupils equal and reactive,  EOMI, face symmetric, tongue midline Motor: 3/5 strength in B/L UE, 2/5 strength in B/L LE, proximal > distal weakness Reflexes: absent patellar and biceps reflexes Sensory: reduced sensation to light touch and temperature over glove and stocking distribution  Gait: unable to test   Basic Metabolic Panel: Recent Labs  Lab 11/01/17 1700  11/04/17 0448 11/05/17 0017 11/06/17 0452 11/06/17 2149 11/07/17 0626 11/08/17 0403  NA  --    < > 153* 157* 157* 158* 159* 157*  K  --    < > 3.0* 3.3* 3.5 3.7 4.3 3.8  CL  --    < > 117* 120* 120* 120* 120* 122*  CO2  --    < > 27 24 25 25 24 28   GLUCOSE  --    < > 181* 231* 209* 268* 201* 204*  BUN  --    < > 46* 51* 47* 46* 50* 45*  CREATININE  --    < > 1.28* 1.60* 1.79* 1.98* 2.26* 1.96*  CALCIUM  --    < > 9.2 9.1 8.7* 8.6* 8.6* 8.1*  MG 2.0  --  2.2 2.1 2.3  --  2.1 2.3  PHOS 4.6  --   --  3.5 4.1  --  5.5* 3.7   < > = values in this interval not displayed.    CBC: Recent Labs  Lab 11/04/17 0448 11/05/17 0017 11/06/17 0452 11/07/17 0626 11/08/17 0403  WBC 9.8 9.7 8.4 11.0* 9.7  NEUTROABS  --  6.3 4.6 7.0 6.3  HGB 13.9 13.7 14.2 13.7 12.5*  HCT 46.1 45.8 48.9 48.1 44.0  MCV 92.2 92.5 94.4 96.8 97.3  PLT 345 377 342 226 229     Coagulation  Studies: No results for input(s): LABPROT, INR in the last 72 hours.  Imaging Reviewed:     ASSESSMENT AND PLAN  52 year old male admitted on 9/6 with refractory partial right occipital seizures that have been ongoing forabout a month, thought to be related to severe hypoglycemia with T2 hypointensity on MRI brain. Neruology was reconsulted as patient appeared more lethargic on 9/16.  Was loaded with Dilantin due to subtherapeutic level. EEG repeated  showed generalized slowing.   The patient is more alert following simple commands and answering simple questions and continues to improve today.     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 Patient will need outpatient follow up with Neurology for seizure management. Please call if exam changes or any questions.   Karena Addison Aroor [facility] Pager Number [phone] For questions after 7pm please refer to AMION to reach the Neurologist  on call

## 2017-11-08 NOTE — Progress Notes (Signed)
CPAP has been being held due to confusion and seizures.  Pt resting comfortably with good sats on nasal cannula.  Pt does not appear able to remove CPAP mask on his own if he should need to. Pt sounds congested which clears with cough.  Pt would not be able to cough and clear secretions with CPAP mask in place.

## 2017-11-08 NOTE — Progress Notes (Signed)
MEDICATION RELATED CONSULT NOTE - Follow-up  Pharmacy Consult for Phenytoin Indication: Status epilepticus / seizures  Allergies  Allergen Reactions  . Montelukast Other (See Comments)    Possibly cause headaches per spouse    Patient Measurements: Height: 5\' 9"  (175.3 cm) Weight: (!) 306 lb 3.5 oz (138.9 kg) IBW/kg (Calculated) : 70.7  Vital Signs: Temp: 99.7 F (37.6 C) (09/18 1619) Temp Source: Axillary (09/18 1619) BP: 139/79 (09/18 1619) Pulse Rate: 69 (09/18 1619)  Labs: Recent Labs    11/06/17 0452  11/07/17 0626 11/07/17 1809 11/08/17 0403 11/08/17 1209  WBC 8.4  --  11.0*  --  9.7  --   HGB 14.2  --  13.7  --  12.5*  --   HCT 48.9  --  48.1  --  44.0  --   PLT 342  --  226  --  229  --   CREATININE 1.79*   < > 2.26*  --  1.96* 1.84*  LABCREA  --   --   --  153.97  --   --   MG 2.3  --  2.1  --  2.3  --   PHOS 4.1  --  5.5*  --  3.7  --   ALBUMIN  --   --   --   --  2.1*  --    < > = values in this interval not displayed.   Estimated Creatinine Clearance: 64.4 mL/min (A) (by C-G formula based on SCr of 1.84 mg/dL (H)).  Assessment: 52 yo M presented with status epilepticus. Started on several anti-seizure medications. Currently on IV Phenytoin, Keppra and Vimptat.  Phenytoin trough 9/16 pm was low at < 2.5 and level at ~0600 on 9/17 up to 18, but was checked while IV dose was being infused.    Phenytoin level today = 9.7 mcg/ml.  Corrects to 18.7 mcg/ml for low albumin 2.1.   Phenytoin dose increased from 150 mg to 175 mg IV q8hrs on 9/17 per Neuro.  Goal of Therapy:  Total Phenytoin level 10-20 mcg/ml Prevention of seizures  Plan:  Continue Phenytoin 175mg  IV Q8h (monitor closely with AKI) Continue Keppra 1g IV Q12h and Vimpat 200 mg IV q12hrs per Neuro. Monitor for seizures, renal function, phenytoin levels at Css F/u plans to switch PO when able.  Arty Baumgartner, [place] Pager: [phone] or phone: [phone] 11/08/2017,4:31 PM

## 2017-11-09 ENCOUNTER — Inpatient Hospital Stay: Payer: Self-pay

## 2017-11-09 ENCOUNTER — Inpatient Hospital Stay (HOSPITAL_COMMUNITY): Payer: Medicaid Other

## 2017-11-09 LAB — GLUCOSE, CAPILLARY
GLUCOSE-CAPILLARY: 154 mg/dL — AB (ref 70–99)
GLUCOSE-CAPILLARY: 153 mg/dL — AB (ref 70–99)
GLUCOSE-CAPILLARY: 164 mg/dL — AB (ref 70–99)
Glucose-Capillary: 154 mg/dL — ABNORMAL HIGH (ref 70–99)
Glucose-Capillary: 240 mg/dL — ABNORMAL HIGH (ref 70–99)

## 2017-11-09 LAB — RENAL FUNCTION PANEL
ANION GAP: 8 (ref 5–15)
Albumin: 2.1 g/dL — ABNORMAL LOW (ref 3.5–5.0)
BUN: 33 mg/dL — ABNORMAL HIGH (ref 6–20)
CALCIUM: 7.8 mg/dL — AB (ref 8.9–10.3)
CHLORIDE: 119 mmol/L — AB (ref 98–111)
CO2: 26 mmol/L (ref 22–32)
Creatinine, Ser: 1.63 mg/dL — ABNORMAL HIGH (ref 0.61–1.24)
GFR calc non Af Amer: 47 mL/min — ABNORMAL LOW (ref >60)
GFR, EST AFRICAN AMERICAN: 54 mL/min — AB (ref >60)
Glucose, Bld: 203 mg/dL — ABNORMAL HIGH (ref 70–99)
POTASSIUM: 3.5 mmol/L (ref 3.5–5.1)
Phosphorus: 2.4 mg/dL — ABNORMAL LOW (ref 2.5–4.6)
Sodium: 153 mmol/L — ABNORMAL HIGH (ref 135–145)

## 2017-11-09 LAB — PHENYTOIN LEVEL, TOTAL: Phenytoin Lvl: 9 ug/mL — ABNORMAL LOW (ref 10.0–20.0)

## 2017-11-09 MED ORDER — SODIUM CHLORIDE 0.9% FLUSH
10.0000 mL | INTRAVENOUS | Status: DC | PRN
  Administered 2017-11-15: 40 mL
  Administered 2017-11-15: 20 mL
  Administered 2017-11-15: 10 mL
  Administered 2017-11-20: 40 mL
  Filled 2017-11-09 (×4): qty 40

## 2017-11-09 MED ORDER — SODIUM CHLORIDE 0.9% FLUSH
10.0000 mL | Freq: Two times a day (BID) | INTRAVENOUS | Status: DC
  Administered 2017-11-09 – 2017-11-13 (×7): 10 mL
  Administered 2017-11-14: 40 mL
  Administered 2017-11-17 – 2017-11-22 (×10): 10 mL

## 2017-11-09 NOTE — Progress Notes (Signed)
From ID perspective, no contraindication to [facility].  Would clear with renal due to his kidney dysfunction.

## 2017-11-09 NOTE — Care Management Note (Signed)
Case Management Note  Patient Details  Name: Daniel [name] MRN: [id number] Date of Birth: 06/04/1965  Subjective/Objective:                    Action/Plan: Plan is for SNF when medically ready. CM following.  Expected Discharge Date:                  Expected Discharge Plan:  Skilled Nursing Facility  In-House Referral:  Clinical Social Work  Discharge planning Services     Post Acute Care Choice:    Choice offered to:     DME Arranged:    DME Agency:     HH Arranged:    [place] Agency:     Status of Service:  In process, will continue to follow  If discussed at Long Length of Stay Meetings, dates discussed:    Additional Comments:  Pollie Friar, RN 11/09/2017, 4:43 PM

## 2017-11-09 NOTE — Progress Notes (Signed)
Peripherally Inserted Central Catheter/Midline Placement  The IV Nurse has discussed with the patient and/or persons authorized to consent for the patient, the purpose of this procedure and the potential benefits and risks involved with this procedure.  The benefits include less needle sticks, lab draws from the catheter, and the patient may be discharged home with the catheter. Risks include, but not limited to, infection, bleeding, blood clot (thrombus formation), and puncture of an artery; nerve damage and irregular heartbeat and possibility to perform a PICC exchange if needed/ordered by physician.  Alternatives to this procedure were also discussed.  Bard Power PICC patient education guide, fact sheet on infection prevention and patient information card has been provided to patient /or left at bedside.    PICC/Midline Placement Documentation  PICC Double Lumen 37/04/88 PICC Right Basilic 43 cm 0 cm (Active)  Indication for Insertion or Continuance of Line Poor Vasculature-patient has had multiple peripheral attempts or PIVs lasting less than 24 hours 11/09/2017  3:17 PM  Exposed Catheter (cm) 0 cm 11/09/2017  3:17 PM  Site Assessment Clean;Dry;Intact 11/09/2017  3:17 PM  Lumen #1 Status Flushed;Saline locked;Blood return noted 11/09/2017  3:17 PM  Lumen #2 Status Flushed;Saline locked;Blood return noted 11/09/2017  3:17 PM  Dressing Type Transparent;Securing device 11/09/2017  3:17 PM  Dressing Status Clean;Dry;Intact;Antimicrobial disc in place 11/09/2017  3:17 PM  Dressing Change Due 11/16/17 11/09/2017  3:17 PM       Frances Maywood 11/09/2017, 3:21 PM

## 2017-11-09 NOTE — Progress Notes (Signed)
MEDICATION RELATED CONSULT NOTE - Follow-up  Pharmacy Consult for Phenytoin Indication: Status epilepticus / seizures  Allergies  Allergen Reactions  . Montelukast Other (See Comments)    Possibly cause headaches per spouse    Patient Measurements: Height: 5\' 9"  (175.3 cm) Weight: (!) 301 lb 5.9 oz (136.7 kg) IBW/kg (Calculated) : 70.7  Vital Signs: Temp: 98.5 F (36.9 C) (09/19 1116) Temp Source: Oral (09/19 1116) BP: 148/70 (09/19 1116) Pulse Rate: 68 (09/19 1116)  Labs: Recent Labs    11/07/17 0626 11/07/17 1809 11/08/17 0403 11/08/17 1209 11/09/17 0308  WBC 11.0*  --  9.7  --   --   HGB 13.7  --  12.5*  --   --   HCT 48.1  --  44.0  --   --   PLT 226  --  229  --   --   CREATININE 2.26*  --  1.96* 1.84* 1.63*  LABCREA  --  153.97  --   --   --   MG 2.1  --  2.3  --   --   PHOS 5.5*  --  3.7  --  2.4*  ALBUMIN  --   --  2.1*  --  2.1*   Estimated Creatinine Clearance: 72 mL/min (A) (by C-G formula based on SCr of 1.63 mg/dL (H)).  Assessment: 52 yo M presented with status epilepticus. Started on several anti-seizure medications. Currently on IV Phenytoin, Keppra and Vimptat.  Phenytoin trough 9/16 pm was low at < 2.5 and level at ~0600 on 9/17 up to 18, but was checked while IV dose was being infused.    Phenytoin level 9/18 = 9.7 mcg/ml.  Corrects to 18.7 mcg/ml for low albumin 2.1.   Phenytoin dose increased from 150 mg to 175 mg IV q8hrs on 9/17 per Neuro.   Phenytoin level 9/19 = 9 mcg/ml, corrects to 17.3 mcg/ml for low albumin 2.1  Goal of Therapy:  Total Phenytoin level 10-20 mcg/ml Prevention of seizures  Plan:  Continue Phenytoin 175mg  IV Q8h (monitor closely with AKI, creatinine trending down.) Continue Keppra 1g IV Q12h and Vimpat 200 mg IV q12hrs per Neuro. Monitor for seizures, renal function, phenytoin levels at Css PICC line placed today. F/u switch meds to PO when able. Cancelled daily Dilantin levels.  Neuro signed off 9/18  Arty Baumgartner, [place] Pager: [phone] or phone: [phone] 11/09/2017,3:50 PM

## 2017-11-09 NOTE — Progress Notes (Signed)
PROGRESS NOTE  Daniel [name]  IDP:[id number] DOB: 23-Jan-1966 DOA: 10/27/2017 PCP: Antonietta Jewel, MD   Brief Narrative: Daniel [name] is a 52 y.o. male with a history of seizure disorder, IDDM, OSA, OHS, morbid obesity, HTN, IDDM, and PE who presented to [facility] with confusion, headache, hallucinations, and falls found to have hypertensive urgency, started on esmolol gtt, and developed seizures in the ED. He was admitted to [facility] and intubated 9/7. Developed increasing secretions found to have Hemophilus pneumonia, treated with antibiotics, ultimately extubated 9/13. Has had an episode of diminished responsiveness for which neurology was reconsulted and reloaded dilantin due to subtherapeutic level. EEG repeated, showing generalized slowing. Nephrology was consulted for AKI and hypernatremia which are improving with IV fluids.   Assessment & Plan: Principal Problem:   Recurrent seizures ([place]) Active Problems:   Sleep apnea   Obesity hypoventilation syndrome (HCC)   Hypertension   Status epilepticus ([place])   Fever  Status epilepticus, thought to be hyperglycemia-induced:  - Continue AEDs per neurology: vimpat, dilantin, keppra. Phenytoin level slightly subtherapeutic. Appreciate pharmacy assistance with dosing.   Poorly responsive episodes: Possibly post ictal on 9/16, reloaded with dilantin and has improved. No respiratory compromise noted. No seizure activity noted by family at bedside.  - Continue close monitoring in SDU   Acute encephalopathy:  - ?Sedation by AEDs. ABG has ruled out hypercarbia. - Continue treatment of hypernatremia per nephrology - Mobilize as able. PT/OT/SLP  Dysphagia:  - Continue working with SLP. Continue dysphagia diet and thickened liquids (family educated). Given coughing while taking po, would have low threshold for repeat CXR, cultures if fever recurs and starting unasyn for presumed aspiration.   Hypernatremia:  - Increased rate of 1/2NS. Appreciate nephrology  assistance.  - Improving at acceptable rate. Continue daily monitoring.   Poor IV access:  - Defer to nephrology regarding placement of PICC.   Acute hypoxic respiratory failure: Ongoing poor respiratory status due to OSA, OHS, morbid obesity, obstructive lung disease, CAP. - Completed antibiotics 9/17.  - CPAP qHS  Fever: Afebrile since 9/17 off abx. Cortisol, TSH wnl. ID consulted, feel this is more likely drug-related or central fever.  - Continue monitoring.   IDDM: Uncontrolled with hyperglycemia. HbA1c 15.8%. So his AVERAGE blood sugar has been >400mg /dl. - Home lantus dose is 120 units, so suspect a lot of resistance due to obesity vs. incomplete adherence given insulin requirements here.  - Recently increased to resistant SSI, started lantus last night. CBGs this AM improved. Will continue monitoring today and titrate as necessary. Avoid hypoglycemia with seizures.  Hypertensive emergency: Improved.  - Continue BP medications.   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   AKI on stage III CKD: Partially due to urinary retention.  - Nephrology consulted, on hypotonic fluids. - Hold ACE - Continue foley  Hypothyroidism: TSH 1.415 - Continue synthroid.  Diarrhea:  - Negative CDiff  Left cephalic vein SVT:  - Warm compresses. No DVT.  DVT prophylaxis: Heparin Code Status: Full Family Communication: Mother at bedside Disposition Plan: Uncertain, will likely need placement but unclear timing based on clinical course.   Consultants:   Neurology  PCCM  Infectious disease  Nephrology  Procedures:  EEG 9/07 >> focal non convulsive status epilepticus from Rt occipital region LP 9/08 >> RBC 99, WBC 4, glucose 104, protein 81  Cultures/Antimicrobials:  CSF 9/07 >> negative  Blood 9/10 >> negative  Sputum 9/10 >>Haemophilus influenzae  C diff  PCR 9/12 >>negative   Vancomycin 9/10 -9/12  Zosyn 9/10 -9/12  Rocephin 9/12 - 9/17  Subjective: Mor responsive this AM, answers slowly but appropriately. Was coughing this morning with family's administration of thin liquids. No fever or seizure like activity.   Objective: Vitals:   11/09/17 0100 11/09/17 0337 11/09/17 0500 11/09/17 0803  BP: 131/68 (!) 159/68  138/73  Pulse:  73  79  Resp: 19 20  20   Temp:  98.2 F (36.8 C)  98.5 F (36.9 C)  TempSrc:  Oral  Oral  SpO2: 94% 94%  100%  Weight:   (!) 136.7 kg   Height:        Intake/Output Summary (Last 24 hours) at 11/09/2017 1004 Last data filed at 11/09/2017 0932 Gross per 24 hour  Intake 3797.85 ml  Output 600 ml  Net 3197.85 ml   Filed Weights   11/07/17 0500 11/08/17 0449 11/09/17 0500  Weight: 134.9 kg (!) 138.9 kg (!) 136.7 kg   Gen: 52 y.o. male in no distress Pulm: Nonlabored breathing oxygen. Diminished CV: Regular rate and rhythm. No murmur, rub, or gallop. No JVD, trace dependent edema. GI: Abdomen soft, non-tender, non-distended, with normoactive bowel sounds.  Ext: Warm, no deformities. Diffuse edema.  Skin: No new rashes, lesions or ulcers on visualized skin.  Neuro: Alert and oriented. Unable to move legs, only has 3/5 strength in upper extremities at hand and elbow, none at shoulder on exam but has moved this per mother earlier today. ?effort. No new focal deficits. Psych: Judgement and insight appear impaired. Mood euthymic & affect congruent. Behavior is appropriate. Denies AVH.   Data Reviewed: I have personally reviewed following labs and imaging studies  CBC: Recent Labs  Lab 11/04/17 0448 11/05/17 0017 11/06/17 0452 11/07/17 0626 11/08/17 0403  WBC 9.8 9.7 8.4 11.0* 9.7  NEUTROABS  --  6.3 4.6 7.0 6.3  HGB 13.9 13.7 14.2 13.7 12.5*  HCT 46.1 45.8 48.9 48.1 44.0  MCV 92.2 92.5 94.4 96.8 97.3  PLT 345 377 342 226 355   Basic Metabolic Panel: Recent Labs  Lab 11/04/17 0448  11/05/17 0017 11/06/17 0452 11/06/17 2149 11/07/17 0626 11/08/17 0403 11/08/17 1209 11/09/17 0308  NA 153* 157* 157* 158* 159* 157* 154* 153*  K 3.0* 3.3* 3.5 3.7 4.3 3.8 4.9 3.5  CL 117* 120* 120* 120* 120* 122* 122* 119*  CO2 27 24 25 25 24 28 24 26   GLUCOSE 181* 231* 209* 268* 201* 204* 290* 203*  BUN 46* 51* 47* 46* 50* 45* 39* 33*  CREATININE 1.28* 1.60* 1.79* 1.98* 2.26* 1.96* 1.84* 1.63*  CALCIUM 9.2 9.1 8.7* 8.6* 8.6* 8.1* 7.6* 7.8*  MG 2.2 2.1 2.3  --  2.1 2.3  --   --   PHOS  --  3.5 4.1  --  5.5* 3.7  --  2.4*   GFR: Estimated Creatinine Clearance: 72 mL/min (A) (by C-G formula based on SCr of 1.63 mg/dL (H)). Liver Function Tests: Recent Labs  Lab 11/05/17 0017 11/08/17 0403 11/09/17 0308  AST 20  --   --   ALT 17  --   --   ALKPHOS 65  --   --   BILITOT 1.0  --   --   PROT 6.2*  --   --   ALBUMIN 2.3* 2.1* 2.1*   No results for input(s): LIPASE, AMYLASE in the last 168 hours. Recent Labs  Lab 11/07/17 1023  AMMONIA 37*   Coagulation Profile: No results for input(s): INR, PROTIME in the last 168 hours. Cardiac Enzymes: Recent Labs  Lab 11/07/17 0626 11/08/17 1209  CKTOTAL 125 271   BNP (last 3 results) No results for input(s): PROBNP in the last 8760 hours. HbA1C: No results for input(s): HGBA1C in the last 72 hours. CBG: Recent Labs  Lab 11/08/17 1628 11/08/17 1950 11/08/17 2308 11/09/17 0335 11/09/17 0843  GLUCAP 244* 214* 236* 154* 153*   Lipid Profile: No results for input(s): CHOL, HDL, LDLCALC, TRIG, CHOLHDL, LDLDIRECT in the last 72 hours. Thyroid Function Tests: Recent Labs    11/07/17 1937  TSH 1.415   Anemia Panel: No results for input(s): VITAMINB12, FOLATE, FERRITIN, TIBC, IRON, RETICCTPCT in the last 72 hours. Urine analysis:    Component Value Date/Time   COLORURINE YELLOW 11/07/2017 1809   APPEARANCEUR HAZY (A) 11/07/2017 1809   LABSPEC 1.020 11/07/2017 1809   PHURINE 5.0 11/07/2017 1809   GLUCOSEU 50 (A)  11/07/2017 1809   HGBUR MODERATE (A) 11/07/2017 1809   BILIRUBINUR NEGATIVE 11/07/2017 1809   KETONESUR NEGATIVE 11/07/2017 1809   PROTEINUR 100 (A) 11/07/2017 1809   NITRITE NEGATIVE 11/07/2017 1809   LEUKOCYTESUR NEGATIVE 11/07/2017 1809   Recent Results (from the past 240 hour(s))  Culture, respiratory (non-expectorated)     Status: None   Collection Time: 10/31/17  9:01 AM  Result Value Ref Range Status   Specimen Description TRACHEAL ASPIRATE  Final   Special Requests NONE  Final   Gram Stain   Final    MODERATE WBC PRESENT, PREDOMINANTLY PMN FEW GRAM NEGATIVE RODS RARE GRAM POSITIVE RODS    Culture   Final    MODERATE HAEMOPHILUS INFLUENZAE BETA LACTAMASE NEGATIVE Performed at [facility], [place] [address] [place], [place] [address]    Report Status 11/01/2017 FINAL  Final  Culture, blood (Routine X 2) w Reflex to ID Panel     Status: None   Collection Time: 10/31/17 11:04 AM  Result Value Ref Range Status   Specimen Description BLOOD BLOOD RIGHT HAND  Final   Special Requests   Final    BOTTLES DRAWN AEROBIC ONLY Blood Culture adequate volume   Culture   Final    NO GROWTH 5 DAYS Performed at [facility], [place] [address] [place], [place] [address]    Report Status 11/05/2017 FINAL  Final  Culture, blood (Routine X 2) w Reflex to ID Panel     Status: None   Collection Time: 10/31/17 11:08 AM  Result Value Ref Range Status   Specimen Description BLOOD BLOOD RIGHT HAND  Final   Special Requests   Final    BOTTLES DRAWN AEROBIC ONLY Blood Culture results may not be optimal due to an inadequate volume of blood received in culture bottles   Culture   Final    NO GROWTH 5 DAYS Performed at [facility], [place] [address] [place], [place] [address]    Report Status 11/05/2017 FINAL  Final  C difficile quick scan w PCR reflex     Status: None   Collection Time: 11/02/17  8:30 AM  Result Value Ref Range Status   C Diff antigen NEGATIVE NEGATIVE Final    C Diff toxin NEGATIVE NEGATIVE Final   C Diff interpretation No C. difficile detected.  Final    Comment: Performed at [facility], [place] [address], [place], [place] [address]  Culture, blood (Routine  X 2) w Reflex to ID Panel     Status: None (Preliminary result)   Collection Time: 11/06/17 10:50 AM  Result Value Ref Range Status   Specimen Description BLOOD RIGHT ANTECUBITAL  Final   Special Requests   Final    BOTTLES DRAWN AEROBIC ONLY Blood Culture results may not be optimal due to an inadequate volume of blood received in culture bottles   Culture   Final    NO GROWTH 3 DAYS Performed at [facility], [place] [address] [place], [place] [address]    Report Status PENDING  Incomplete  Culture, blood (Routine X 2) w Reflex to ID Panel     Status: None (Preliminary result)   Collection Time: 11/06/17 11:19 AM  Result Value Ref Range Status   Specimen Description BLOOD RIGHT ANTECUBITAL  Final   Special Requests   Final    BOTTLES DRAWN AEROBIC ONLY Blood Culture adequate volume   Culture   Final    NO GROWTH 3 DAYS Performed at [facility], [place] [address], [place], [place] [address]    Report Status PENDING  Incomplete  Urine Culture     Status: None   Collection Time: 11/06/17  6:52 PM  Result Value Ref Range Status   Specimen Description URINE, CATHETERIZED  Final   Special Requests NONE  Final   Culture   Final    NO GROWTH Performed at [facility], [address], [place], [place] [address]    Report Status 11/08/2017 FINAL  Final      Radiology Studies: US Renal  Result Date: 11/07/2017 CLINICAL DATA:  Acute renal failure EXAM: RENAL / URINARY TRACT ULTRASOUND COMPLETE COMPARISON:  Ultrasound 06/25/2016 FINDINGS: Right Kidney: Length: 12.9 cm. Echogenicity within normal limits. No mass or hydronephrosis visualized. Left Kidney: Length: 14 cm. Echogenicity within normal limits. No mass or hydronephrosis visualized. Bladder: Appears normal for  degree of bladder distention. Incidental note made of slightly echogenic liver. IMPRESSION: Negative renal ultrasound Electronically Signed   By: Donavan Foil M.D.   On: 11/07/2017 21:15    Scheduled Meds: . aspirin  81 mg Oral Daily  . budesonide (PULMICORT) nebulizer solution  0.25 mg Nebulization BID  . chlorhexidine  15 mL Mouth Rinse BID  . heparin injection (subcutaneous)  5,000 Units Subcutaneous Q8H  . hydrALAZINE  50 mg Oral Q8H  . insulin aspart  0-20 Units Subcutaneous Q4H  . insulin glargine  10 Units Subcutaneous QHS  . ipratropium-albuterol  3 mL Nebulization BID  . levothyroxine  50 mcg Oral QAC breakfast  . mouth rinse  15 mL Mouth Rinse q12n4p  . metoprolol succinate  50 mg Oral Daily  . pantoprazole sodium  40 mg Per Tube Q24H   Continuous Infusions: . sodium chloride 150 mL/hr at 11/08/17 2133  . lacosamide (VIMPAT) IV 200 mg (11/08/17 2211)  . levETIRAcetam 1,000 mg (11/09/17 0943)  . phenytoin (DILANTIN) IV 175 mg (11/09/17 0635)     LOS: 13 days   Time spent: 35 minutes.  Patrecia Pour, MD [facility] [email or web address] Password [email or web address] 11/09/2017, 10:04 AM

## 2017-11-09 NOTE — Progress Notes (Signed)
S: Unresponsive this morning.  Per his mother, went to sleep after sitting up in bed. O:BP (!) 148/70 (BP Location: Right Arm)   Pulse 68   Temp 98.5 F (36.9 C) (Oral)   Resp 20   Ht 5\' 9"  (1.753 m)   Wt (!) 136.7 kg   SpO2 97%   BMI 44.50 kg/m   Intake/Output Summary (Last 24 hours) at 11/09/2017 1227 Last data filed at 11/09/2017 0852 Gross per 24 hour  Intake 3672.85 ml  Output 600 ml  Net 3072.85 ml   Intake/Output: I/O last 3 completed shifts: In: 6539.7 [P.O.:120; I.V.:5327.7; IV Piggyback:1092] Out: 600 [Urine:600]  Intake/Output this shift:  Total I/O In: 120 [P.O.:120] Out: -  Weight change: -2.2 kg Gen: obese WM, somnolent and not arousable CVS: no rub Resp: scattered rhonchi Abd: obese, +BS, soft, nt/nd Ext: 1+ edema of upper extremities, R>L  Recent Labs  Lab 11/05/17 0017 11/06/17 0452 11/06/17 2149 11/07/17 0626 11/08/17 0403 11/08/17 1209 11/09/17 0308  NA 157* 157* 158* 159* 157* 154* 153*  K 3.3* 3.5 3.7 4.3 3.8 4.9 3.5  CL 120* 120* 120* 120* 122* 122* 119*  CO2 24 25 25 24 28 24 26   GLUCOSE 231* 209* 268* 201* 204* 290* 203*  BUN 51* 47* 46* 50* 45* 39* 33*  CREATININE 1.60* 1.79* 1.98* 2.26* 1.96* 1.84* 1.63*  ALBUMIN 2.3*  --   --   --  2.1*  --  2.1*  CALCIUM 9.1 8.7* 8.6* 8.6* 8.1* 7.6* 7.8*  PHOS 3.5 4.1  --  5.5* 3.7  --  2.4*  AST 20  --   --   --   --   --   --   ALT 17  --   --   --   --   --   --    Liver Function Tests: Recent Labs  Lab 11/05/17 0017 11/08/17 0403 11/09/17 0308  AST 20  --   --   ALT 17  --   --   ALKPHOS 65  --   --   BILITOT 1.0  --   --   PROT 6.2*  --   --   ALBUMIN 2.3* 2.1* 2.1*   No results for input(s): LIPASE, AMYLASE in the last 168 hours. Recent Labs  Lab 11/07/17 1023  AMMONIA 37*   CBC: Recent Labs  Lab 11/04/17 0448  11/05/17 0017 11/06/17 0452 11/07/17 0626 11/08/17 0403  WBC 9.8  --  9.7 8.4 11.0* 9.7  NEUTROABS  --    < > 6.3 4.6 7.0 6.3  HGB 13.9  --  13.7 14.2 13.7  12.5*  HCT 46.1  --  45.8 48.9 48.1 44.0  MCV 92.2  --  92.5 94.4 96.8 97.3  PLT 345  --  377 342 226 229   < > = values in this interval not displayed.   Cardiac Enzymes: Recent Labs  Lab 11/07/17 0626 11/08/17 1209  CKTOTAL 125 271   CBG: Recent Labs  Lab 11/08/17 1950 11/08/17 2308 11/09/17 0335 11/09/17 0843 11/09/17 1114  GLUCAP 214* 236* 154* 153* 164*    Iron Studies: No results for input(s): IRON, TIBC, TRANSFERRIN, FERRITIN in the last 72 hours. Studies/Results: US Renal  Result Date: 11/07/2017 CLINICAL DATA:  Acute renal failure EXAM: RENAL / URINARY TRACT ULTRASOUND COMPLETE COMPARISON:  Ultrasound 06/25/2016 FINDINGS: Right Kidney: Length: 12.9 cm. Echogenicity within normal limits. No mass or hydronephrosis visualized. Left Kidney: Length: 14 cm. Echogenicity within normal  limits. No mass or hydronephrosis visualized. Bladder: Appears normal for degree of bladder distention. Incidental note made of slightly echogenic liver. IMPRESSION: Negative renal ultrasound Electronically Signed   By: Donavan Foil M.D.   On: 11/07/2017 21:15   . aspirin  81 mg Oral Daily  . budesonide (PULMICORT) nebulizer solution  0.25 mg Nebulization BID  . chlorhexidine  15 mL Mouth Rinse BID  . heparin injection (subcutaneous)  5,000 Units Subcutaneous Q8H  . hydrALAZINE  50 mg Oral Q8H  . insulin aspart  0-20 Units Subcutaneous Q4H  . insulin glargine  10 Units Subcutaneous QHS  . ipratropium-albuterol  3 mL Nebulization BID  . levothyroxine  50 mcg Oral QAC breakfast  . mouth rinse  15 mL Mouth Rinse q12n4p  . metoprolol succinate  50 mg Oral Daily  . pantoprazole sodium  40 mg Per Tube Q24H    BMET    Component Value Date/Time   NA 153 (H) 11/09/2017 0308   K 3.5 11/09/2017 0308   CL 119 (H) 11/09/2017 0308   CO2 26 11/09/2017 0308   GLUCOSE 203 (H) 11/09/2017 0308   BUN 33 (H) 11/09/2017 0308   CREATININE 1.63 (H) 11/09/2017 0308   CALCIUM 7.8 (L) 11/09/2017 0308    GFRNONAA 47 (L) 11/09/2017 0308   GFRAA 54 (L) 11/09/2017 0308   CBC    Component Value Date/Time   WBC 9.7 11/08/2017 0403   RBC 4.52 11/08/2017 0403   HGB 12.5 (L) 11/08/2017 0403   HCT 44.0 11/08/2017 0403   PLT 229 11/08/2017 0403   MCV 97.3 11/08/2017 0403   MCH 27.7 11/08/2017 0403   MCHC 28.4 (L) 11/08/2017 0403   RDW 14.4 11/08/2017 0403   LYMPHSABS 2.1 11/08/2017 0403   MONOABS 0.6 11/08/2017 0403   EOSABS 0.5 11/08/2017 0403   BASOSABS 0.1 11/08/2017 0403    Assessment/Plan: 1. AKI/CKD stage 3- in setting of status epilepticus and volume depletion. Will stop lactated ringers and start 1/2 NS due to hypernatremia. His bladder scan had >300 cc's and nurse instructed to place I&O cath but may need to have foley replaced. Renal US without obstruction.  Urine studies with some blood and protein. Continue to hold lisinopril and avoid nephrotoxic agents.  Renal function improving with IVF's.  Will workup hematuria and proteinuria but likely due to underlying diabetic nephropathy 2. Hypernatremia- free water deficit is 11 liters. Started on 1/2 NS at 100 ml/hr but increased rate to 150. Will recheck sodium level now and again in am.  Encourage po intake and able to take safely as he remains an aspiration risk.  3. Refractory partial right occipital seizures- Neuro following and has pt on fosphenytoin, phenytoin, keppra and vimpat. Also stressing glucose control. Currently responsive and Neuro following.  4. Hypertensive urgency- improved with medications.  Ok to switch to po meds now that he is awake (was on hydralazine 100mg  tid, lisinopril 10mg  and demadex 20.  Would hold demadex and lisinopril for now and could start po metoprolol 50mg  ER daily) 5. DM- per primary svc 6. OSA on CPAP per Primary svc  Donetta Potts, MD [facility] [phone]

## 2017-11-09 NOTE — Progress Notes (Signed)
Physical Therapy Treatment Patient Details Name: Daniel [name] MRN: [id number] DOB: [birth year] Today's Date: 11/09/2017    History of Present Illness 52 yo male presented to [facility] with altered mental status, confusion, headaches, hallucinations, and falls. SBP was over 200. In ER at [facility] he was started on esmolol gtt. Developed seizure in [place] at [place]. He continued to have seizures and transferred to [facility]. He was started on burst suppression with versed and required intubation 9/07. Developed fever and increased sputum 9/10.  Intubated 9/10-9/13.      PT Comments    Patient seen for mobility progression. Vital go tilt bed utilized during session to promote improved mobility and strengthening.  Pt tolerated tilt up to 50 degrees well and able to participate in bilat UE therex. Continue to progress as tolerated.   Follow Up Recommendations  SNF;Supervision/Assistance - 24 hour     Equipment Recommendations  None recommended by PT    Recommendations for Other Services       Precautions / Restrictions Precautions Precautions: Fall Restrictions Weight Bearing Restrictions: No    Mobility  Bed Mobility               General bed mobility comments: total A +2 to reposition in bed   Transfers                 General transfer comment: tilt bed utilized for mobility this session; pt tolerated tilt up to 50 degrees   Ambulation/Gait             General Gait Details: unable   Stairs             Wheelchair Mobility    Modified Rankin (Stroke Patients Only)       Balance                                            Cognition Arousal/Alertness: Awake/alert Behavior During Therapy: Flat affect Overall Cognitive Status: Impaired/Different from baseline Area of Impairment: Attention;Following commands;Safety/judgement;Awareness;Problem solving                   Current Attention Level: Focused    Following Commands: Follows one step commands inconsistently Safety/Judgement: Decreased awareness of safety;Decreased awareness of deficits   Problem Solving: Slow processing;Requires verbal cues;Requires tactile cues General Comments: pt answering questions, question accuracy of answers, and pt began speaking with an increasingly softer voice and unable to understand       Exercises General Exercises - Upper Extremity Shoulder Flexion: AAROM;Both Elbow Flexion: AAROM;Both Elbow Extension: AAROM;Both Digit Composite Flexion: AROM;AAROM;Both Composite Extension: AROM;AAROM General Exercises - Lower Extremity Ankle Circles/Pumps: Supine;AROM;Both;AAROM    General Comments        Pertinent Vitals/Pain Pain Assessment: No/denies pain Pain Descriptors / Indicators: (grimacing with some movements)    Home Living                      Prior Function            PT Goals (current goals can now be found in the care plan section) Acute Rehab PT Goals Patient Stated Goal: unable to state Progress towards PT goals: Progressing toward goals    Frequency    Min 3X/week      PT Plan Current plan remains appropriate    Co-evaluation  AM-PAC PT "6 Clicks" Daily Activity  Outcome Measure  Difficulty turning over in bed (including adjusting bedclothes, sheets and blankets)?: Unable Difficulty moving from lying on back to sitting on the side of the bed? : Unable Difficulty sitting down on and standing up from a chair with arms (e.g., wheelchair, bedside commode, etc,.)?: Unable Help needed moving to and from a bed to chair (including a wheelchair)?: Total Help needed walking in hospital room?: Total Help needed climbing 3-5 steps with a railing? : Total 6 Click Score: 6    End of Session Equipment Utilized During Treatment: Oxygen Activity Tolerance: Patient tolerated treatment well Patient left: in bed;with call bell/phone within reach;with bed  alarm set;with SCD's reapplied;with family/visitor present Nurse Communication: Need for lift equipment;Mobility status PT Visit Diagnosis: Muscle weakness (generalized) (M62.81)     Time: 1586-8257 PT Time Calculation (min) (ACUTE ONLY): 32 min  Charges:  $Therapeutic Exercise: 8-22 mins $Therapeutic Activity: 8-22 mins                     Earney Navy, PTA Acute Rehabilitation Services Pager: [phone] Office: [phone]     Darliss Cheney 11/09/2017, 1:00 PM

## 2017-11-09 NOTE — Progress Notes (Signed)
Renal was contacted to allow PICC placement.  We have no objections  Daniel [name] A

## 2017-11-09 NOTE — Progress Notes (Addendum)
Patient lost IV access, arms edematous and not able to draw blood or get another IV. MD notified  MD Coladonato paged this AM regarding PICC placement. Following up with MD Moshe Cipro.

## 2017-11-09 NOTE — Progress Notes (Signed)
Walked into patient room, patients mother was feeding him and patient was coughing. RN educated family on need to stop feeding if patient is coughing. Family verbalized understanding, MD notified. Will change diet to honey thick liquids per SLP recommendation and MD order. Will continue to monitor

## 2017-11-10 LAB — RENAL FUNCTION PANEL
Albumin: 2 g/dL — ABNORMAL LOW (ref 3.5–5.0)
Anion gap: 9 (ref 5–15)
BUN: 19 mg/dL (ref 6–20)
CHLORIDE: 116 mmol/L — AB (ref 98–111)
CO2: 25 mmol/L (ref 22–32)
Calcium: 7.7 mg/dL — ABNORMAL LOW (ref 8.9–10.3)
Creatinine, Ser: 1.35 mg/dL — ABNORMAL HIGH (ref 0.61–1.24)
GFR calc non Af Amer: 58 mL/min — ABNORMAL LOW (ref >60)
Glucose, Bld: 158 mg/dL — ABNORMAL HIGH (ref 70–99)
POTASSIUM: 2.9 mmol/L — AB (ref 3.5–5.1)
Phosphorus: 2 mg/dL — ABNORMAL LOW (ref 2.5–4.6)
Sodium: 150 mmol/L — ABNORMAL HIGH (ref 135–145)

## 2017-11-10 LAB — GLUCOSE, CAPILLARY
GLUCOSE-CAPILLARY: 121 mg/dL — AB (ref 70–99)
GLUCOSE-CAPILLARY: 149 mg/dL — AB (ref 70–99)
GLUCOSE-CAPILLARY: 167 mg/dL — AB (ref 70–99)
GLUCOSE-CAPILLARY: 172 mg/dL — AB (ref 70–99)
Glucose-Capillary: 154 mg/dL — ABNORMAL HIGH (ref 70–99)
Glucose-Capillary: 142 mg/dL — ABNORMAL HIGH (ref 70–99)
Glucose-Capillary: 144 mg/dL — ABNORMAL HIGH (ref 70–99)
Glucose-Capillary: 148 mg/dL — ABNORMAL HIGH (ref 70–99)

## 2017-11-10 LAB — PROTEIN ELECTROPHORESIS, SERUM
A/G Ratio: 0.7 (ref 0.7–1.7)
Albumin ELP: 1.9 g/dL — ABNORMAL LOW (ref 2.9–4.4)
Alpha-1-Globulin: 0.2 g/dL (ref 0.0–0.4)
Alpha-2-Globulin: 0.9 g/dL (ref 0.4–1.0)
Beta Globulin: 0.7 g/dL (ref 0.7–1.3)
GAMMA GLOBULIN: 0.8 g/dL (ref 0.4–1.8)
Globulin, Total: 2.6 g/dL (ref 2.2–3.9)
Total Protein ELP: 4.5 g/dL — ABNORMAL LOW (ref 6.0–8.5)

## 2017-11-10 LAB — PROTEIN / CREATININE RATIO, URINE
Creatinine, Urine: 63.06 mg/dL
Protein Creatinine Ratio: 2.38 mg/mg{Cre} — ABNORMAL HIGH (ref 0.00–0.15)
TOTAL PROTEIN, URINE: 150 mg/dL

## 2017-11-10 LAB — KAPPA/LAMBDA LIGHT CHAINS
KAPPA FREE LGHT CHN: 58 mg/L — AB (ref 3.3–19.4)
Kappa, lambda light chain ratio: 1.36 (ref 0.26–1.65)
LAMDA FREE LIGHT CHAINS: 42.8 mg/L — AB (ref 5.7–26.3)

## 2017-11-10 MED ORDER — ENSURE ENLIVE PO LIQD
237.0000 mL | Freq: Two times a day (BID) | ORAL | Status: DC
  Administered 2017-11-10 – 2017-11-22 (×10): 237 mL via ORAL

## 2017-11-10 MED ORDER — POTASSIUM CHLORIDE 10 MEQ/50ML IV SOLN
10.0000 meq | INTRAVENOUS | Status: AC
  Administered 2017-11-10 (×3): 10 meq via INTRAVENOUS
  Filled 2017-11-10 (×5): qty 50

## 2017-11-10 MED ORDER — HALOPERIDOL LACTATE 5 MG/ML IJ SOLN
2.0000 mg | Freq: Once | INTRAMUSCULAR | Status: AC
  Administered 2017-11-10: 2 mg via INTRAVENOUS
  Filled 2017-11-10: qty 1

## 2017-11-10 NOTE — Progress Notes (Signed)
Nutrition Follow-up  DOCUMENTATION CODES:   Morbid obesity  INTERVENTION:  Provide Magic cup TID with meals, each supplement provides 290 kcal and 9 grams of protein.  Provide Ensure Enlive po BID (thickened to appropriate consistency), each supplement provides 350 kcal and 20 grams of protein.  Encourage adequate PO intake.   NUTRITION DIAGNOSIS:   Inadequate oral intake related to inability to eat as evidenced by NPO status; diet advanced  GOAL:   Patient will meet greater than or equal to 90% of their needs; progressing  MONITOR:   PO intake, Supplement acceptance, Diet advancement, Weight trends, Labs, Skin, I & O's  REASON FOR ASSESSMENT:   Consult Enteral/tube feeding initiation and management  ASSESSMENT:   52 yo male with PMH of IDDM, HTN, OSA, OHS, and seizures who was admitted to [facility] on 9/6 with AMS. Had multiple seizures and was transferred to [facility]; required intubation 9/7. Extubated 9/13. Cortrak NGT removed 9/17.   Pt continues on a dysphagia 2 diet with honey thick liquids. Meal completion has been 25%. Pt reports appetite is fine, however dislikes his dysphagia 2 diet. Pt currently has Magic cup on trays, however reports dislike of vanilla flavor. RD to modify supplement orders to meet pt's food preferences. RD to additionally order Ensure to aid in caloric and protein needs. Pt encouraged to eat his food at meals. Family at bedside has been encouraging po as well.   When a patient presents with low albumin, it is likely skewed due to the acute inflammatory response. Note that low albumin is no longer used to diagnose malnutrition; [place] uses the new malnutrition guidelines published by the American Society for Parenteral and Enteral Nutrition (A.S.P.E.N.) and the Academy of Nutrition and Dietetics (AND).    Labs and medications reviewed. Sodium elevated at 150. Chloride elevated at 116. Phosphorous low at 2.  Diet Order:   Diet Order        DIET DYS 2 Room service appropriate? Yes; Fluid consistency: Honey Thick  Diet effective now              EDUCATION NEEDS:   No education needs have been identified at this time  Skin:  Skin Assessment: Reviewed RN Assessment  Last BM:  9/18  Height:   Ht Readings from Last 1 Encounters:  10/28/17 5\' 9"  (1.753 m)    Weight:   Wt Readings from Last 1 Encounters:  11/10/17 (!) 143.6 kg    Ideal Body Weight:  72.7 kg  BMI:  Body mass index is 46.74 kg/m.  Estimated Nutritional Needs:   Kcal:  2000-2200  Protein:  110-120 grams  Fluid:  2- 2.2 L/day    Corrin Parker, MS, RD, LDN Pager # [phone] After hours/ weekend pager # [phone]

## 2017-11-10 NOTE — Progress Notes (Signed)
Occupational Therapy Treatment Patient Details Name: Daniel [name] MRN: [id number] DOB: 1965-12-16 Today's Date: 11/10/2017    History of present illness 52 yo male presented to [facility] with altered mental status, confusion, headaches, hallucinations, and falls. SBP was over 200. In ER at [facility] he was started on esmolol gtt. Developed seizure in [place] at [place]. He continued to have seizures and transferred to [facility]. He was started on burst suppression with versed and required intubation 9/07. Developed fever and increased sputum 9/10.  Intubated 9/10-9/13.     OT comments  Patient engaged in OT session with encouragement, limited verbalizations during session but fair attention to tasks.  Continues to require +3 to roll in bed, positioned into chair position in order to engage in UE functional reaching and ADL tasks.  Able to follow 1 step commands with fair consistency given increased time, min assist to use swab for oral care guiding assist to mouth and bimanual folding tasks using towel with mod assist.  Noted R lateral lean with chair position in bed, and required total assistance to reposition multiple times.  L UE edema remains, positioned on pillows and encouraged hand pumps/ROM.  DC plan remains appropriate.  Will continue to follow.    Follow Up Recommendations  SNF;Supervision/Assistance - 24 hour    Equipment Recommendations  Other (comment)(TBD at next venue of care)    Recommendations for Other Services      Precautions / Restrictions Precautions Precautions: Fall Restrictions Weight Bearing Restrictions: No       Mobility Bed Mobility Overal bed mobility: Needs Assistance Bed Mobility: Rolling Rolling: Total assist;+2 for physical assistance(+3 assist, cueing for hand position and reaching )         General bed mobility comments: total A +3 to pull up in bed, total +2 to reposition in chair position with noted R lateral lean    Transfers                  General transfer comment: tilt bed utilized to chair position only    Balance                                           ADL either performed or assessed with clinical judgement   ADL Overall ADL's : Needs assistance/impaired     Grooming: Oral care;Bed level;Minimal assistance Grooming Details (indicate cue type and reason): min assist for bed level functional reaching to use swab during oral care; increased time to reach mouth                                     Vision       Perception     Praxis      Cognition Arousal/Alertness: Awake/alert Behavior During Therapy: Flat affect Overall Cognitive Status: Impaired/Different from baseline Area of Impairment: Attention;Following commands;Orientation;Problem solving                 Orientation Level: (pt declined to answer orientation questions) Current Attention Level: Sustained   Following Commands: Follows one step commands inconsistently;Follows one step commands with increased time     Problem Solving: Slow processing;Requires verbal cues;Requires tactile cues;Decreased initiation;Difficulty sequencing General Comments: Pt with limited verbalizations today, requires greatly increased time and multimodal cues to participate  Exercises Exercises: General Upper Extremity General Exercises - Upper Extremity Shoulder Flexion: AAROM;Both;10 reps;Seated(tilt bed chair position) Shoulder Extension: AAROM;Both;10 reps;Seated(tilt bed chair position) Other Exercises Other Exercises: chair position bimanual folding towel task  Other Exercises: repositioning L UE on pillow to support and reduce edema   Shoulder Instructions       General Comments      Pertinent Vitals/ Pain       Pain Assessment: Faces Faces Pain Scale: No hurt  Home Living                                          Prior Functioning/Environment               Frequency  Min 2X/week        Progress Toward Goals  OT Goals(current goals can now be found in the care plan section)  Progress towards OT goals: Progressing toward goals  Acute Rehab OT Goals Patient Stated Goal: unable to state OT Goal Formulation: Patient unable to participate in goal setting Time For Goal Achievement: 11/20/17 Potential to Achieve Goals: [place] Discharge plan remains appropriate;Frequency remains appropriate    Co-evaluation                 AM-PAC PT "6 Clicks" Daily Activity     Outcome Measure   Help from another person eating meals?: Total Help from another person taking care of personal grooming?: A Lot Help from another person toileting, which includes using toliet, bedpan, or urinal?: Total Help from another person bathing (including washing, rinsing, drying)?: Total Help from another person to put on and taking off regular upper body clothing?: Total Help from another person to put on and taking off regular lower body clothing?: Total 6 Click Score: 7    End of Session    OT Visit Diagnosis: Other abnormalities of gait and mobility (R26.89);Muscle weakness (generalized) (M62.81);Low vision, both eyes (H54.2);Other symptoms and signs involving cognitive function   Activity Tolerance Patient limited by lethargy   Patient Left in bed;with call bell/phone within reach;with family/visitor present   Nurse Communication Mobility status        Time: 8675-4492 OT Time Calculation (min): 25 min  Charges: OT General Charges $OT Visit: 1 Visit OT Treatments $Self Care/Home Management : 23-37 mins  Delight Stare, [place] Pager [phone] Office [phone]    Delight Stare 11/10/2017, 3:37 PM

## 2017-11-10 NOTE — Progress Notes (Signed)
PROGRESS NOTE  Daniel [name]  KNL:[id number] DOB: 1965-04-11 DOA: 10/27/2017 PCP: Antonietta Jewel, MD   Brief Narrative: Daniel [name] is a 52 y.o. male with a history of seizure disorder, IDDM, OSA, OHS, morbid obesity, HTN, IDDM, and PE who presented to [facility] with confusion, headache, hallucinations, and falls found to have hypertensive urgency, started on esmolol gtt, and developed seizures in the ED. He was admitted to [facility] and intubated 9/7. Developed increasing secretions found to have Hemophilus pneumonia, treated with antibiotics, ultimately extubated 9/13. Has had an episode of diminished responsiveness for which neurology was reconsulted and reloaded dilantin due to subtherapeutic level. EEG repeated, showing generalized slowing. Nephrology was consulted for AKI and hypernatremia which are improving with IV fluids.   Assessment & Plan: Principal Problem:   Recurrent seizures ([place]) Active Problems:   Sleep apnea   Obesity hypoventilation syndrome (HCC)   Hypertension   Status epilepticus ([place])   Fever  Status epilepticus, thought to be hyperglycemia-induced:  - Continue AEDs per neurology: vimpat, dilantin, keppra. Phenytoin level low, but corrects to goal range 10-20 with hypoalbuminemia.   Hypernatremia:  - Improving, anticipate improvement when he can take free water independently. Will decrease rate today. - Continue daily monitoring.   IDDM: Uncontrolled with hyperglycemia. HbA1c 15.8%. So his AVERAGE blood sugar has been >400mg /dl. - Home lantus (U500) dose is 120 units, so suspect a lot of resistance due to obesity vs. incomplete adherence given insulin requirements here.  - Continue resistant SSI, started lantus and so far at inpatient goal. Anticipate needing to titrate upwards as po intake improves. Avoid hypoglycemia with seizures.  AKI on stage III CKD: Partially due to urinary retention, hypovolemia. Nephrology consulted, started IVFs, holding diuretic, ACE with improvement.  CrCl now well above 55ml/min - Nephrology signed off 9/20. Continue monitoring, decrease IVF's as po intake improves. Monitor UOP, anticipate autodiuresis.  - SPEP, light chains sent and pending. UPr:Cr 2.38. Will need nephrology follow up for hematuria, proteinuria eventually.  Hypoalbuminemia, suspect malnutrition:  - Dietitian consulted  Edema: Mostly due to inactivity, hypoalbuminemia (malnutrition and proteinuria contributing), IV's/SVT.  - Mostly upper extremities, not dependent, so will hold on diuretic and continue IVF's for now. No evidence of pulmonary edema.  Hypokalemia:  - Replacement ordered by nephrology. Will monitor.   Poorly responsive episodes: Possibly post ictal on 9/16, reloaded with dilantin and has improved. No respiratory compromise noted. No seizure activity noted by family at bedside.  - Continue close monitoring in SDU   Acute encephalopathy:  - ?Sedation by AEDs. ABG has ruled out hypercarbia. - Continue treatment of hypernatremia per nephrology - Mobilize as able. PT/OT/SLP  Dysphagia:  - Continue working with SLP. Continue dysphagia diet and thickened liquids (family educated). Given coughing while taking po, would have low threshold for repeat CXR, cultures if fever recurs and starting unasyn for presumed aspiration.   Poor IV access: PICC placed 9/19 RUE.  Acute hypoxic respiratory failure: Ongoing poor respiratory status due to OSA, OHS, morbid obesity, obstructive lung disease, CAP. - Completed antibiotics 9/17.  - CPAP qHS  Fever: Afebrile since 9/17 off abx. Cortisol, TSH wnl. ID consulted, feel this is more likely drug-related or central fever.  - Continue monitoring.   Hypertensive emergency: Improved.  - Continue BP medications.   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   Hypothyroidism: TSH 1.415 - Continue  synthroid.  Diarrhea:  - Negative CDiff  Left cephalic vein SVT:  - Warm compresses. No DVT.  DVT prophylaxis: Heparin Code Status: Full Family Communication: Wife at bedside Disposition Plan: SNF once po intake improved, electrolytes stabilized without need for IV fluids.  Consultants:   Neurology  PCCM  Infectious disease  Nephrology  Procedures:  EEG 9/07 >> focal non convulsive status epilepticus from Rt occipital region LP 9/08 >> RBC 99, WBC 4, glucose 104, protein 81  Cultures/Antimicrobials:  CSF 9/07 >> negative  Blood 9/10 >> negative  Sputum 9/10 >>Haemophilus influenzae  C diff PCR 9/12 >>negative   Vancomycin 9/10 -9/12  Zosyn 9/10 -9/12  Rocephin 9/12 - 9/17  Subjective: Mentation returning to baseline per wife, has swelling in arms and hands, very little in legs. No dyspnea. Wants water badly.    Objective: Vitals:   11/10/17 0842 11/10/17 0843 11/10/17 0942 11/10/17 1227  BP:   (!) 163/74 (!) 147/86  Pulse:   72 67  Resp:   16 16  Temp:   98.2 F (36.8 C) 98.1 F (36.7 C)  TempSrc:   Oral Oral  SpO2: 98% 98% 98% 100%  Weight:      Height:       No intake or output data in the 24 hours ending 11/10/17 1258 Filed Weights   11/07/17 0500 11/08/17 0449 11/09/17 0500  Weight: 134.9 kg (!) 138.9 kg (!) 136.7 kg   Gen: Obese, large-framed male in no distress Pulm: Nonlabored breathing. Clear without crackles laterally. CV: Regular rate and rhythm. No murmur, rub, or gallop. No JVD, No LE dependent edema. GI: Abdomen soft, non-tender, non-distended, with normoactive bowel sounds.  Ext: Warm, no deformities Skin: No new  rashes, lesions or ulcers on visualized skin.  Neuro: Alert and oriented. Diffuse weakness limiting strength in upper extremities to 3/5, 1/5 LE's. No focal neurological deficits. Psych: Judgement and insight appear fair. Mood euthymic & affect congruent. Behavior is appropriate.    Data Reviewed: I have personally  reviewed following labs and imaging studies  CBC: Recent Labs  Lab 11/04/17 0448 11/05/17 0017 11/06/17 0452 11/07/17 0626 11/08/17 0403  WBC 9.8 9.7 8.4 11.0* 9.7  NEUTROABS  --  6.3 4.6 7.0 6.3  HGB 13.9 13.7 14.2 13.7 12.5*  HCT 46.1 45.8 48.9 48.1 44.0  MCV 92.2 92.5 94.4 96.8 97.3  PLT 345 377 342 226 326   Basic Metabolic Panel: Recent Labs  Lab 11/04/17 0448  11/05/17 0017 11/06/17 0452  11/07/17 0626 11/08/17 0403 11/08/17 1209 11/09/17 0308 11/10/17 0855  NA 153*  --  157* 157*   < > 159* 157* 154* 153* 150*  K 3.0*  --  3.3* 3.5   < > 4.3 3.8 4.9 3.5 2.9*  CL 117*  --  120* 120*   < > 120* 122* 122* 119* 116*  CO2 27  --  24 25   < > 24 28 24 26 25   GLUCOSE 181*  --  231* 209*   < > 201* 204* 290* 203* 158*  BUN 46*  --  51* 47*   < > 50* 45* 39* 33* 19  CREATININE 1.28*  --  1.60* 1.79*   < > 2.26* 1.96* 1.84* 1.63* 1.35*  CALCIUM 9.2  --  9.1 8.7*   < > 8.6* 8.1* 7.6* 7.8* 7.7*  MG 2.2  --  2.1 2.3  --  2.1 2.3  --   --   --   PHOS  --    < >  3.5 4.1  --  5.5* 3.7  --  2.4* 2.0*   < > = values in this interval not displayed.   GFR: Estimated Creatinine Clearance: 86.9 mL/min (A) (by C-G formula based on SCr of 1.35 mg/dL (H)). Liver Function Tests: Recent Labs  Lab 11/05/17 0017 11/08/17 0403 11/09/17 0308 11/10/17 0855  AST 20  --   --   --   ALT 17  --   --   --   ALKPHOS 65  --   --   --   BILITOT 1.0  --   --   --   PROT 6.2*  --   --   --   ALBUMIN 2.3* 2.1* 2.1* 2.0*   No results for input(s): LIPASE, AMYLASE in the last 168 hours. Recent Labs  Lab 11/07/17 1023  AMMONIA 37*   Coagulation Profile: No results for input(s): INR, PROTIME in the last 168 hours. Cardiac Enzymes: Recent Labs  Lab 11/07/17 0626 11/08/17 1209  CKTOTAL 125 271   BNP (last 3 results) No results for input(s): PROBNP in the last 8760 hours. HbA1C: No results for input(s): HGBA1C in the last 72 hours. CBG: Recent Labs  Lab 11/10/17 0036 11/10/17 0439  11/10/17 0610 11/10/17 0827 11/10/17 1129  GLUCAP 149* 154* 142* 121* 144*   Lipid Profile: No results for input(s): CHOL, HDL, LDLCALC, TRIG, CHOLHDL, LDLDIRECT in the last 72 hours. Thyroid Function Tests: Recent Labs    11/07/17 1937  TSH 1.415   Anemia Panel: No results for input(s): VITAMINB12, FOLATE, FERRITIN, TIBC, IRON, RETICCTPCT in the last 72 hours. Urine analysis:    Component Value Date/Time   COLORURINE YELLOW 11/07/2017 1809   APPEARANCEUR HAZY (A) 11/07/2017 1809   LABSPEC 1.020 11/07/2017 1809   PHURINE 5.0 11/07/2017 1809   GLUCOSEU 50 (A) 11/07/2017 1809   HGBUR MODERATE (A) 11/07/2017 1809   BILIRUBINUR NEGATIVE 11/07/2017 1809   KETONESUR NEGATIVE 11/07/2017 1809   PROTEINUR 100 (A) 11/07/2017 1809   NITRITE NEGATIVE 11/07/2017 1809   LEUKOCYTESUR NEGATIVE 11/07/2017 1809   Recent Results (from the past 240 hour(s))  C difficile quick scan w PCR reflex     Status: None   Collection Time: 11/02/17  8:30 AM  Result Value Ref Range Status   C Diff antigen NEGATIVE NEGATIVE Final   C Diff toxin NEGATIVE NEGATIVE Final   C Diff interpretation No C. difficile detected.  Final    Comment: Performed at [facility], [place] [address] [place], [place] [address]  Culture, blood (Routine X 2) w Reflex to ID Panel     Status: None (Preliminary result)   Collection Time: 11/06/17 10:50 AM  Result Value Ref Range Status   Specimen Description BLOOD RIGHT ANTECUBITAL  Final   Special Requests   Final    BOTTLES DRAWN AEROBIC ONLY Blood Culture results may not be optimal due to an inadequate volume of blood received in culture bottles   Culture   Final    NO GROWTH 4 DAYS Performed at [facility], [place] [address], [place], [place] [address]    Report Status PENDING  Incomplete  Culture, blood (Routine X 2) w Reflex to ID Panel     Status: None (Preliminary result)   Collection Time: 11/06/17 11:19 AM  Result Value Ref Range Status   Specimen  Description BLOOD RIGHT ANTECUBITAL  Final   Special Requests   Final    BOTTLES DRAWN AEROBIC ONLY Blood Culture adequate  volume   Culture   Final    NO GROWTH 4 DAYS Performed at [facility], [place] [address] [place], [place] [address]    Report Status PENDING  Incomplete  Urine Culture     Status: None   Collection Time: 11/06/17  6:52 PM  Result Value Ref Range Status   Specimen Description URINE, CATHETERIZED  Final   Special Requests NONE  Final   Culture   Final    NO GROWTH Performed at [facility], [address], [place], [place] [address]    Report Status 11/08/2017 FINAL  Final      Radiology Studies: Dg Chest Port 1 View  Addendum Date: 11/09/2017   ADDENDUM REPORT: 11/09/2017 16:06 ADDENDUM: Recommend retracting the PICC line 3 cm. Electronically Signed   By: Kathreen Devoid   On: 11/09/2017 16:06   Result Date: 11/09/2017 CLINICAL DATA:  Right-sided PICC line insertion EXAM: PORTABLE CHEST 1 VIEW COMPARISON:  None. FINDINGS: There is a right-sided PICC line with the tip projecting over the right atrium. There is no focal parenchymal opacity. There is no pleural effusion or pneumothorax. The heart and mediastinal contours are unremarkable. The osseous structures are unremarkable. IMPRESSION: There is a right-sided PICC line with the tip projecting over the right atrium. Electronically Signed: By: Kathreen Devoid On: 11/09/2017 15:52   [place] Ekg Site Rite  Result Date: 11/09/2017 If Site Rite image not attached, placement could not be confirmed due to current cardiac rhythm.   Scheduled Meds: . aspirin  81 mg Oral Daily  . budesonide (PULMICORT) nebulizer solution  0.25 mg Nebulization BID  . chlorhexidine  15 mL Mouth Rinse BID  . heparin injection (subcutaneous)  5,000 Units Subcutaneous Q8H  . hydrALAZINE  50 mg Oral Q8H  . insulin aspart  0-20 Units Subcutaneous Q4H  . insulin glargine  10 Units Subcutaneous QHS  . ipratropium-albuterol  3 mL Nebulization  BID  . levothyroxine  50 mcg Oral QAC breakfast  . mouth rinse  15 mL Mouth Rinse q12n4p  . metoprolol succinate  50 mg Oral Daily  . pantoprazole sodium  40 mg Per Tube Q24H  . sodium chloride flush  10-40 mL Intracatheter Q12H   Continuous Infusions: . sodium chloride 150 mL/hr at 11/10/17 1230  . lacosamide (VIMPAT) IV 200 mg (11/10/17 1135)  . levETIRAcetam 1,000 mg (11/10/17 1007)  . phenytoin (DILANTIN) IV 175 mg (11/10/17 0553)  . potassium chloride       LOS: 14 days   Time spent: 35 minutes.  Patrecia Pour, MD [facility] [email or web address] Password [facility] 11/10/2017, 12:58 PM

## 2017-11-10 NOTE — Progress Notes (Signed)
Relative contraindication for CPAP QHS, patient seems confused and AMS.

## 2017-11-10 NOTE — Progress Notes (Signed)
2nd page to on call provider for pt agitation and confusion, needing prn medication to help with with pt agitation.

## 2017-11-10 NOTE — Progress Notes (Signed)
S:More awake and alert this morning.  Still thirsty O:BP (!) 163/74 (BP Location: Left Arm)   Pulse 72   Temp 98.2 F (36.8 C) (Oral)   Resp 16   Ht 5\' 9"  (1.753 m)   Wt (!) 136.7 kg   SpO2 98%   BMI 44.50 kg/m  No intake or output data in the 24 hours ending 11/10/17 1217 Intake/Output: I/O last 3 completed shifts: In: 2557.6 [P.O.:240; I.V.:2030.6; IV Piggyback:287] Out: 600 [Urine:600]  Intake/Output this shift:  No intake/output data recorded. Weight change:  Gen: somnolent but arousable and verbal CVS: no rub Resp: cta Abd: obese, +BS, soft Ext: 1+ edema of upper extremities  Recent Labs  Lab 11/05/17 0017 11/06/17 0452 11/06/17 2149 11/07/17 0626 11/08/17 0403 11/08/17 1209 11/09/17 0308 11/10/17 0855  NA 157* 157* 158* 159* 157* 154* 153* 150*  K 3.3* 3.5 3.7 4.3 3.8 4.9 3.5 2.9*  CL 120* 120* 120* 120* 122* 122* 119* 116*  CO2 24 25 25 24 28 24 26 25   GLUCOSE 231* 209* 268* 201* 204* 290* 203* 158*  BUN 51* 47* 46* 50* 45* 39* 33* 19  CREATININE 1.60* 1.79* 1.98* 2.26* 1.96* 1.84* 1.63* 1.35*  ALBUMIN 2.3*  --   --   --  2.1*  --  2.1* 2.0*  CALCIUM 9.1 8.7* 8.6* 8.6* 8.1* 7.6* 7.8* 7.7*  PHOS 3.5 4.1  --  5.5* 3.7  --  2.4* 2.0*  AST 20  --   --   --   --   --   --   --   ALT 17  --   --   --   --   --   --   --    Liver Function Tests: Recent Labs  Lab 11/05/17 0017 11/08/17 0403 11/09/17 0308 11/10/17 0855  AST 20  --   --   --   ALT 17  --   --   --   ALKPHOS 65  --   --   --   BILITOT 1.0  --   --   --   PROT 6.2*  --   --   --   ALBUMIN 2.3* 2.1* 2.1* 2.0*   No results for input(s): LIPASE, AMYLASE in the last 168 hours. Recent Labs  Lab 11/07/17 1023  AMMONIA 37*   CBC: Recent Labs  Lab 11/04/17 0448  11/05/17 0017 11/06/17 0452 11/07/17 0626 11/08/17 0403  WBC 9.8  --  9.7 8.4 11.0* 9.7  NEUTROABS  --    < > 6.3 4.6 7.0 6.3  HGB 13.9  --  13.7 14.2 13.7 12.5*  HCT 46.1  --  45.8 48.9 48.1 44.0  MCV 92.2  --  92.5 94.4  96.8 97.3  PLT 345  --  377 342 226 229   < > = values in this interval not displayed.   Cardiac Enzymes: Recent Labs  Lab 11/07/17 0626 11/08/17 1209  CKTOTAL 125 271   CBG: Recent Labs  Lab 11/10/17 0036 11/10/17 0439 11/10/17 0610 11/10/17 0827 11/10/17 1129  GLUCAP 149* 154* 142* 121* 144*    Iron Studies: No results for input(s): IRON, TIBC, TRANSFERRIN, FERRITIN in the last 72 hours. Studies/Results: Dg Chest Port 1 View  Addendum Date: 11/09/2017   ADDENDUM REPORT: 11/09/2017 16:06 ADDENDUM: Recommend retracting the PICC line 3 cm. Electronically Signed   By: Kathreen Devoid   On: 11/09/2017 16:06   Result Date: 11/09/2017 CLINICAL DATA:  Right-sided PICC line  insertion EXAM: PORTABLE CHEST 1 VIEW COMPARISON:  None. FINDINGS: There is a right-sided PICC line with the tip projecting over the right atrium. There is no focal parenchymal opacity. There is no pleural effusion or pneumothorax. The heart and mediastinal contours are unremarkable. The osseous structures are unremarkable. IMPRESSION: There is a right-sided PICC line with the tip projecting over the right atrium. Electronically Signed: By: Kathreen Devoid On: 11/09/2017 15:52   [place] Ekg Site Rite  Result Date: 11/09/2017 If Site Rite image not attached, placement could not be confirmed due to current cardiac rhythm.  Marland Kitchen aspirin  81 mg Oral Daily  . budesonide (PULMICORT) nebulizer solution  0.25 mg Nebulization BID  . chlorhexidine  15 mL Mouth Rinse BID  . heparin injection (subcutaneous)  5,000 Units Subcutaneous Q8H  . hydrALAZINE  50 mg Oral Q8H  . insulin aspart  0-20 Units Subcutaneous Q4H  . insulin glargine  10 Units Subcutaneous QHS  . ipratropium-albuterol  3 mL Nebulization BID  . levothyroxine  50 mcg Oral QAC breakfast  . mouth rinse  15 mL Mouth Rinse q12n4p  . metoprolol succinate  50 mg Oral Daily  . pantoprazole sodium  40 mg Per Tube Q24H  . sodium chloride flush  10-40 mL Intracatheter Q12H     BMET    Component Value Date/Time   NA 150 (H) 11/10/2017 0855   K 2.9 (L) 11/10/2017 0855   CL 116 (H) 11/10/2017 0855   CO2 25 11/10/2017 0855   GLUCOSE 158 (H) 11/10/2017 0855   BUN 19 11/10/2017 0855   CREATININE 1.35 (H) 11/10/2017 0855   CALCIUM 7.7 (L) 11/10/2017 0855   GFRNONAA 58 (L) 11/10/2017 0855   GFRAA >60 11/10/2017 0855   CBC    Component Value Date/Time   WBC 9.7 11/08/2017 0403   RBC 4.52 11/08/2017 0403   HGB 12.5 (L) 11/08/2017 0403   HCT 44.0 11/08/2017 0403   PLT 229 11/08/2017 0403   MCV 97.3 11/08/2017 0403   MCH 27.7 11/08/2017 0403   MCHC 28.4 (L) 11/08/2017 0403   RDW 14.4 11/08/2017 0403   LYMPHSABS 2.1 11/08/2017 0403   MONOABS 0.6 11/08/2017 0403   EOSABS 0.5 11/08/2017 0403   BASOSABS 0.1 11/08/2017 0403     Assessment/Plan: 1. AKI/CKD stage 3- in setting of status epilepticus and volume depletion. Will stop lactated ringers and start 1/2 NS due to hypernatremia. His bladder scan had >300 cc's and nurse instructed to place I&O cath but may need to have foley replaced. Renal US without obstruction.  Urine studies with some blood and protein.  1. Continue to hold lisinopril and avoid nephrotoxic agents.   2. Renal function improving with IVF's.   3. Will workup hematuria and proteinuria but likely due to underlying diabetic nephropathy 2. Hypernatremia- free water deficit is 11 liters. Started on 1/2 NS at 100 ml/hr butincreased rateto 150.  1. Encourage po intake and able to take safely as he remains an aspiration risk.  2. Nothing further to add.  Will sign off please call with questions or concerns 3. Refractory partial right occipital seizures- Neuro following and has pt on fosphenytoin, phenytoin, keppra and vimpat. Also stressing glucose control. Currently responsiveand Neuro following. 4. Hypertensive urgency- improved with medications. Ok to switch to po meds now that he is awake (was on hydralazine 100mg  tid,  lisinopril 10mg  and demadex 20. Would hold demadex and lisinopril for now and could start po metoprolol 50mg  ER daily) 5. Hypokalemia- replete  with KCl and follow per primary svc. 6. DM- per primary svc 7. OSA on CPAP per Primary svc  Donetta Potts, MD [facility] [phone]

## 2017-11-10 NOTE — Progress Notes (Signed)
Physical Therapy Treatment Patient Details Name: Daniel [name] MRN: [id number] DOB: [birth year] Today's Date: 11/10/2017    History of Present Illness 52 yo male presented to [facility] with altered mental status, confusion, headaches, hallucinations, and falls. SBP was over 200. In ER at [facility] he was started on esmolol gtt. Developed seizure in [place] at [place]. He continued to have seizures and transferred to [facility]. He was started on burst suppression with versed and required intubation 9/07. Developed fever and increased sputum 9/10.  Intubated 9/10-9/13.      PT Comments    Patient progressing well using the vital go bed. Tolerated standing tilt to 42 degrees with ~40% WB through BLEs. Worked on activating core musculature performing lateral flexion to find midline as well as tolerance to upright with WB through BLEs. Also worked on pulling trunk forward with bed in chair position to activate abdominals as well as there ex. Encouraged pt to sit in chair position for 45 min-1 hour. Demonstrates some impaired attention. Will continue to follow and progress.   Follow Up Recommendations  SNF;Supervision/Assistance - 24 hour     Equipment Recommendations  None recommended by PT    Recommendations for Other Services Rehab consult     Precautions / Restrictions Precautions Precautions: Fall Restrictions Weight Bearing Restrictions: No    Mobility  Bed Mobility Overal bed mobility: Needs Assistance             General bed mobility comments: total A +2 to scoot up in bed.   Transfers                 General transfer comment: tilt bed utilized for mobility this session; pt tolerated standing tilt to 42 degrees, with ~40% WB through LEs.  Ambulation/Gait             General Gait Details: unable   Stairs             Wheelchair Mobility    Modified Rankin (Stroke Patients Only)       Balance Overall balance assessment: Needs assistance                                           Cognition Arousal/Alertness: Awake/alert Behavior During Therapy: Flat affect(joking appropriately though at times) Overall Cognitive Status: Impaired/Different from baseline Area of Impairment: Attention;Orientation                 Orientation Level: Disoriented to;Situation Current Attention Level: Sustained   Following Commands: Follows one step commands inconsistently;Follows one step commands with increased time Safety/Judgement: Decreased awareness of safety   Problem Solving: Slow processing;Requires verbal cues;Requires tactile cues General Comments: "I just want to go home" "can you get me a big jug of water?" Very soft raspy voice. Telling stories about him and his wife's past.       Exercises Other Exercises Other Exercises: Using tilt bed, pt performed lateral trunk flexion towards left pushing through RUE/RLE to find midline posture x10. Other Exercises: WIth bed in chair position, performed LAQ x5 BLEs    General Comments        Pertinent Vitals/Pain Pain Assessment: No/denies pain    Home Living                      Prior Function  PT Goals (current goals can now be found in the care plan section) Progress towards PT goals: Progressing toward goals    Frequency           PT Plan Current plan remains appropriate    Co-evaluation              AM-PAC PT "6 Clicks" Daily Activity  Outcome Measure  Difficulty turning over in bed (including adjusting bedclothes, sheets and blankets)?: Unable Difficulty moving from lying on back to sitting on the side of the bed? : Unable Difficulty sitting down on and standing up from a chair with arms (e.g., wheelchair, bedside commode, etc,.)?: Unable Help needed moving to and from a bed to chair (including a wheelchair)?: Total Help needed walking in hospital room?: Total Help needed climbing 3-5 steps with a railing? :  Total 6 Click Score: 6    End of Session Equipment Utilized During Treatment: Oxygen Activity Tolerance: Patient tolerated treatment well Patient left: in bed;with call bell/phone within reach;with family/visitor present;with nursing/sitter in room(in chair position) Nurse Communication: Need for lift equipment;Mobility status PT Visit Diagnosis: Muscle weakness (generalized) (M62.81)     Time: 2094-7096 PT Time Calculation (min) (ACUTE ONLY): 36 min  Charges:  $Therapeutic Activity: 23-37 mins                     Wray Kearns, [place], DPT Acute Rehabilitation Services Pager [phone] Office [place] 11/10/2017, 9:54 AM

## 2017-11-11 LAB — RENAL FUNCTION PANEL
ALBUMIN: 1.9 g/dL — AB (ref 3.5–5.0)
ANION GAP: 8 (ref 5–15)
BUN: 14 mg/dL (ref 6–20)
CALCIUM: 7.5 mg/dL — AB (ref 8.9–10.3)
CO2: 25 mmol/L (ref 22–32)
CREATININE: 1.32 mg/dL — AB (ref 0.61–1.24)
Chloride: 115 mmol/L — ABNORMAL HIGH (ref 98–111)
GLUCOSE: 149 mg/dL — AB (ref 70–99)
PHOSPHORUS: 2.8 mg/dL (ref 2.5–4.6)
Potassium: 2.7 mmol/L — CL (ref 3.5–5.1)
SODIUM: 148 mmol/L — AB (ref 135–145)

## 2017-11-11 LAB — GLUCOSE, CAPILLARY
GLUCOSE-CAPILLARY: 158 mg/dL — AB (ref 70–99)
GLUCOSE-CAPILLARY: 159 mg/dL — AB (ref 70–99)
GLUCOSE-CAPILLARY: 166 mg/dL — AB (ref 70–99)
Glucose-Capillary: 125 mg/dL — ABNORMAL HIGH (ref 70–99)
Glucose-Capillary: 177 mg/dL — ABNORMAL HIGH (ref 70–99)

## 2017-11-11 LAB — CULTURE, BLOOD (ROUTINE X 2)
CULTURE: NO GROWTH
Culture: NO GROWTH
SPECIAL REQUESTS: ADEQUATE

## 2017-11-11 LAB — MAGNESIUM: MAGNESIUM: 2 mg/dL (ref 1.7–2.4)

## 2017-11-11 MED ORDER — POTASSIUM CHLORIDE 10 MEQ/50ML IV SOLN
10.0000 meq | INTRAVENOUS | Status: AC
  Administered 2017-11-11 (×3): 10 meq via INTRAVENOUS
  Filled 2017-11-11 (×3): qty 50

## 2017-11-11 MED ORDER — SODIUM CHLORIDE 0.45 % IV SOLN
INTRAVENOUS | Status: DC
  Administered 2017-11-11 – 2017-11-13 (×5): via INTRAVENOUS
  Filled 2017-11-11 (×10): qty 1000

## 2017-11-11 NOTE — Progress Notes (Signed)
PROGRESS NOTE  Daniel [name]  YHC:[id number] DOB: [birth year] Daniel [name] PCP: Antonietta Jewel, Daniel [name]   Brief Narrative: Daniel [name] is a 52 y.o. male with a history of seizure disorder, IDDM, OSA, OHS, morbid obesity, HTN, IDDM, and PE who presented to [facility] with confusion, headache, hallucinations, and falls found to have hypertensive urgency, started on esmolol gtt, and developed seizures in the ED. He was admitted to [facility] and intubated 9/7. Developed increasing secretions found to have Hemophilus pneumonia, treated with antibiotics, ultimately extubated 9/13. Has had an episode of diminished responsiveness for which neurology was reconsulted and reloaded dilantin due to subtherapeutic level. EEG repeated, showing generalized slowing. Nephrology was consulted for AKI and hypernatremia which are improving with IV fluids. Mental status has overall improved with bouts of delirium. Per oral intake is slowly improving though still requiring IV fluids and aggressive replacement of electrolytes.  Assessment & Plan: Principal Problem:   Recurrent seizures ([place]) Active Problems:   Sleep apnea   Obesity hypoventilation syndrome (HCC)   Hypertension   Status epilepticus ([place])   Fever  Status epilepticus, thought to be hyperglycemia-induced:  - Continue AEDs per neurology: vimpat, dilantin, keppra. Phenytoin level low, but corrects to goal range 10-20 with hypoalbuminemia.   Hypernatremia:  - Improving, anticipate improvement as he takes free water independently. Continue at 75cc/hr, add K as below - Continue daily monitoring.   IDDM: Uncontrolled with hyperglycemia. HbA1c 15.8%. So his AVERAGE blood sugar has been >400mg /dl. Home lantus (U500) dose is 120 units, so suspect a lot of resistance due to obesity and incomplete adherence (confirmed with wife and parents) given insulin requirements here.  - Continue lantus 10u qHS and resistant SSI (17u total in past 24 hrs). Fasting CBG this AM at goal. Will  not titrate further today.   AKI on stage III CKD: Partially due to urinary retention, hypovolemia. Nephrology consulted, started IVFs, holding diuretic, ACE with improvement. CrCl now well above 89ml/min. SPEP without M-spike; K/L LC ratio normal at 1.36.  - Nephrology signed off 9/20. Continue monitoring, decrease IVF's as po intake improves. Monitor UOP, anticipate autodiuresis. Need to monitor UOP more closely, strict I/O ordered. - Will need nephrology follow up for hematuria, proteinuria eventually. UPr:Cr 2.38; most likely due to diabetic nephropathy.  Hypoalbuminemia, suspect malnutrition despite morbid obesity:  - Dietitian consulted; start supplementation with magic cup TIDWC and ensure enlive po BID thickened   Edema: Mostly due to inactivity, hypoalbuminemia (malnutrition and proteinuria contributing), peripheral IV's/SVT.  - Mostly upper extremities, not dependent, so will hold on diuretic and continue IVF's for now. No evidence of pulmonary edema.  Hypokalemia:  - Down to 2.7 despite 53mEq yesterday with adequate Mg on AM labs. Will add 66mEq/L to IVF's (to give 72 mEq/24hrs) and repeat 3 runs this morning.  - Recheck in AM.  Poorly responsive episodes: Possibly post ictal on 9/16, reloaded with dilantin and has improved. No respiratory compromise noted. No seizure activity noted by family at bedside.  - Continue close monitoring in SDU   Acute encephalopathy, acute delirium: ABG has ruled out hypercarbia. - ?Sedation by AEDs.  - Continue treatment of hypernatremia per nephrology - Mobilize as able. PT/OT/SLP.  Dysphagia:  - Continue working with SLP. Continue dysphagia diet and thickened liquids (family educated). Given coughing while taking po, would have low threshold for repeat CXR, cultures if fever recurs and starting unasyn for presumed aspiration.   Poor IV access: PICC placed 9/19 RUE.  Acute hypoxic respiratory failure: Ongoing  poor respiratory status due to OSA,  OHS, morbid obesity, obstructive lung disease, CAP. - Completed antibiotics 9/17.  - CPAP qHS when able  Fever: Afebrile since 9/17 off abx. Cortisol, TSH wnl. ID consulted, feel this is more likely drug-related or central fever.  - Continue monitoring.   Hypertensive emergency: Improved.  - Continue BP medications.   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   Hypothyroidism: TSH 1.415 - Continue synthroid.  Diarrhea: Has resolved, not a cause of hypokalemia currently. - Negative CDiff  Left cephalic vein SVT:  - Warm compresses. No DVT.  DVT prophylaxis: Heparin Code Status: Full Family Communication: Wife here all night, aided with report; parents at bedside this AM Disposition Plan: SNF once po intake improved, electrolytes stabilized without need for IV fluids. Anticipate ready for discharge early next week.  Consultants:   Neurology  PCCM  Infectious disease  Nephrology  Procedures:  EEG 9/07 >> focal non convulsive status epilepticus from Rt occipital region LP 9/08 >> RBC 99, WBC 4, glucose 104, protein 81  Cultures/Antimicrobials:  CSF 9/07 >> negative  Blood 9/10 >> negative  Sputum 9/10 >>Haemophilus influenzae  C diff PCR 9/12 >>negative   Vancomycin 9/10 -9/12  Zosyn 9/10 -9/12  Rocephin 9/12 - 9/17  Subjective: Had confusion, agitation last night. No reported seizure activity and did not sleep well. He is tired but at his recent mental baseline this morning per wife, parents, and Therapist, sports. No pain. He's thirsty and drinks a cup of thickened cold water during encounter. Denies dyspnea.     Objective: Vitals:   11/11/17 0710 11/11/17 0717 11/11/17 0718 11/11/17 0817  BP: 130/80   130/76  Pulse:    75  Resp:    20  Temp:      TempSrc:      SpO2:  99% 99% 98%  Weight:      Height:        Intake/Output Summary (Last 24  hours) at 11/11/2017 0942 Last data filed at 11/10/2017 1900 Gross per 24 hour  Intake 3760.96 ml  Output -  Net 3760.96 ml   Filed Weights   11/09/17 0500 11/10/17 1227 11/11/17 0500  Weight: (!) 136.7 kg (!) 143.6 kg (!) 147.4 kg   Gen: Obese male in no distress Pulm: Nonlabored breathing supplemental oxygen. No crackles.. CV: Regular rate and rhythm. No murmur, rub, or gallop. No JVD, On feet and presacral there's no pitting edema GI: Abdomen soft, non-tender, non-distended, with normoactive bowel sounds.  Ext: Warm, no deformities Skin: RUE PICC c/d/i, nontender without erythema. No other rashes, lesions or ulcers on visualized skin.  Neuro: Alert and oriented, doesn't recall my name but remembers my face and who I am. Stable diffuse weakness but able to pick legs off bed and raise arms against gravity. Psych: Judgement and insight appear fair. Mood euthymic & affect congruent. Behavior is appropriate.    Data Reviewed: I have personally reviewed following labs and imaging studies  CBC: Recent Labs  Lab 11/05/17 0017 11/06/17 0452 11/07/17 0626 11/08/17 0403  WBC 9.7 8.4 11.0* 9.7  NEUTROABS 6.3 4.6 7.0 6.3  HGB 13.7 14.2 13.7 12.5*  HCT 45.8 48.9 48.1 44.0  MCV 92.5 94.4 96.8 97.3  PLT 377 342 226 638   Basic Metabolic Panel: Recent Labs  Lab 11/05/17 0017 11/06/17 0452  11/07/17 0626 11/08/17 0403 11/08/17 1209  11/09/17 0308 11/10/17 0855 11/11/17 0500  NA 157* 157*   < > 159* 157* 154* 153* 150* 148*  K 3.3* 3.5   < > 4.3 3.8 4.9 3.5 2.9* 2.7*  CL 120* 120*   < > 120* 122* 122* 119* 116* 115*  CO2 24 25   < > 24 28 24 26 25 25   GLUCOSE 231* 209*   < > 201* 204* 290* 203* 158* 149*  BUN 51* 47*   < > 50* 45* 39* 33* 19 14  CREATININE 1.60* 1.79*   < > 2.26* 1.96* 1.84* 1.63* 1.35* 1.32*  CALCIUM 9.1 8.7*   < > 8.6* 8.1* 7.6* 7.8* 7.7* 7.5*  MG 2.1 2.3  --  2.1 2.3  --   --   --  2.0  PHOS 3.5 4.1  --  5.5* 3.7  --  2.4* 2.0* 2.8   < > = values in this  interval not displayed.   GFR: Estimated Creatinine Clearance: 92.8 mL/min (A) (by C-G formula based on SCr of 1.32 mg/dL (H)). Liver Function Tests: Recent Labs  Lab 11/05/17 0017 11/08/17 0403 11/09/17 0308 11/10/17 0855 11/11/17 0500  AST 20  --   --   --   --   ALT 17  --   --   --   --   ALKPHOS 65  --   --   --   --   BILITOT 1.0  --   --   --   --   PROT 6.2*  --   --   --   --   ALBUMIN 2.3* 2.1* 2.1* 2.0* 1.9*   No results for input(s): LIPASE, AMYLASE in the last 168 hours. Recent Labs  Lab 11/07/17 1023  AMMONIA 37*   Coagulation Profile: No results for input(s): INR, PROTIME in the last 168 hours. Cardiac Enzymes: Recent Labs  Lab 11/07/17 0626 11/08/17 1209  CKTOTAL 125 271   BNP (last 3 results) No results for input(s): PROBNP in the last 8760 hours. HbA1C: No results for input(s): HGBA1C in the last 72 hours. CBG: Recent Labs  Lab 11/10/17 1129 11/10/17 1639 11/10/17 2014 11/10/17 2350 11/11/17 0503  GLUCAP 144* 148* 172* 167* 125*   Lipid Profile: No results for input(s): CHOL, HDL, LDLCALC, TRIG, CHOLHDL, LDLDIRECT in the last 72 hours. Thyroid Function Tests: No results for input(s): TSH, T4TOTAL, FREET4, T3FREE, THYROIDAB in the last 72 hours. Anemia Panel: No results for input(s): VITAMINB12, FOLATE, FERRITIN, TIBC, IRON, RETICCTPCT in the last 72 hours. Urine analysis:    Component Value Date/Time   COLORURINE YELLOW 11/07/2017 1809   APPEARANCEUR HAZY (A) 11/07/2017 1809   LABSPEC 1.020 11/07/2017 1809   PHURINE 5.0 11/07/2017 1809   GLUCOSEU 50 (A) 11/07/2017 1809   HGBUR MODERATE (A) 11/07/2017 1809   BILIRUBINUR NEGATIVE 11/07/2017 1809   KETONESUR NEGATIVE 11/07/2017 1809   PROTEINUR 100 (A) 11/07/2017 1809   NITRITE NEGATIVE 11/07/2017 1809   LEUKOCYTESUR NEGATIVE 11/07/2017 1809   Recent Results (from the past 240 hour(s))  C difficile quick scan w PCR reflex     Status: None   Collection Time: 11/02/17  8:30 AM    Result Value Ref Range Status   C Diff antigen NEGATIVE NEGATIVE Final   C Diff toxin NEGATIVE NEGATIVE Final   C Diff interpretation No C. difficile detected.  Final    Comment: Performed at [facility], [place] [address] [place], [place] [address]  Culture, blood (Routine  X 2) w Reflex to ID Panel     Status: None   Collection Time: 11/06/17 10:50 AM  Result Value Ref Range Status   Specimen Description BLOOD RIGHT ANTECUBITAL  Final   Special Requests   Final    BOTTLES DRAWN AEROBIC ONLY Blood Culture results may not be optimal due to an inadequate volume of blood received in culture bottles   Culture   Final    NO GROWTH 5 DAYS Performed at [facility], [place] [address] [place], [place] [address]    Report Status 11/11/2017 FINAL  Final  Culture, blood (Routine X 2) w Reflex to ID Panel     Status: None   Collection Time: 11/06/17 11:19 AM  Result Value Ref Range Status   Specimen Description BLOOD RIGHT ANTECUBITAL  Final   Special Requests   Final    BOTTLES DRAWN AEROBIC ONLY Blood Culture adequate volume   Culture   Final    NO GROWTH 5 DAYS Performed at [facility], [place] [address], [place], [place] [address]    Report Status 11/11/2017 FINAL  Final  Urine Culture     Status: None   Collection Time: 11/06/17  6:52 PM  Result Value Ref Range Status   Specimen Description URINE, CATHETERIZED  Final   Special Requests NONE  Final   Culture   Final    NO GROWTH Performed at [facility], [place] [address], [place], [place] [address]    Report Status 11/08/2017 FINAL  Final      Radiology Studies: Dg Chest Port 1 View  Addendum Date: 11/09/2017   ADDENDUM REPORT: 11/09/2017 16:06 ADDENDUM: Recommend retracting the PICC line 3 cm. Electronically Signed   By: Kathreen Devoid   On: 11/09/2017 16:06   Result Date: 11/09/2017 CLINICAL DATA:  Right-sided PICC line insertion EXAM: PORTABLE CHEST 1 VIEW COMPARISON:  None. FINDINGS: There is a right-sided  PICC line with the tip projecting over the right atrium. There is no focal parenchymal opacity. There is no pleural effusion or pneumothorax. The heart and mediastinal contours are unremarkable. The osseous structures are unremarkable. IMPRESSION: There is a right-sided PICC line with the tip projecting over the right atrium. Electronically Signed: By: Kathreen Devoid On: 11/09/2017 15:52   [place] Ekg Site Rite  Result Date: 11/09/2017 If Site Rite image not attached, placement could not be confirmed due to current cardiac rhythm.   Scheduled Meds: . aspirin  81 mg Oral Daily  . budesonide (PULMICORT) nebulizer solution  0.25 mg Nebulization BID  . chlorhexidine  15 mL Mouth Rinse BID  . feeding supplement (ENSURE ENLIVE)  237 mL Oral BID BM  . heparin injection (subcutaneous)  5,000 Units Subcutaneous Q8H  . hydrALAZINE  50 mg Oral Q8H  . insulin aspart  0-20 Units Subcutaneous Q4H  . insulin glargine  10 Units Subcutaneous QHS  . levothyroxine  50 mcg Oral QAC breakfast  . mouth rinse  15 mL Mouth Rinse q12n4p  . metoprolol succinate  50 mg Oral Daily  . pantoprazole sodium  40 mg Per Tube Q24H  . sodium chloride flush  10-40 mL Intracatheter Q12H   Continuous Infusions: . lacosamide (VIMPAT) IV 200 mg (11/11/17 0931)  . levETIRAcetam 1,000 mg (11/11/17 0854)  . phenytoin (DILANTIN) IV 175 mg (11/11/17 0552)  . potassium chloride    . sodium chloride 0.45 % with kcl       LOS: 15 days   Time  spent: 35 minutes.  Patrecia Pour, Daniel [name] [facility] [email or web address] Password [facility] 11/11/2017, 9:42 AM

## 2017-11-11 NOTE — Progress Notes (Signed)
Asked patient if he would be willing to use CPAP.  He stated that he used it at home and he believed his setting was 2.5.  He stated that he was supposed to be wearing a BIPAP, but he like the CPAP better.  The patient however did refuse the CPAP for tonight.

## 2017-11-12 LAB — RENAL FUNCTION PANEL
ALBUMIN: 1.9 g/dL — AB (ref 3.5–5.0)
Anion gap: 8 (ref 5–15)
BUN: 10 mg/dL (ref 6–20)
CHLORIDE: 111 mmol/L (ref 98–111)
CO2: 28 mmol/L (ref 22–32)
Calcium: 7.6 mg/dL — ABNORMAL LOW (ref 8.9–10.3)
Creatinine, Ser: 1.23 mg/dL (ref 0.61–1.24)
GFR calc Af Amer: >60 mL/min (ref >60)
GFR calc non Af Amer: >60 mL/min (ref >60)
GLUCOSE: 122 mg/dL — AB (ref 70–99)
POTASSIUM: 3 mmol/L — AB (ref 3.5–5.1)
Phosphorus: 2.6 mg/dL (ref 2.5–4.6)
Sodium: 147 mmol/L — ABNORMAL HIGH (ref 135–145)

## 2017-11-12 LAB — GLUCOSE, CAPILLARY
GLUCOSE-CAPILLARY: 123 mg/dL — AB (ref 70–99)
GLUCOSE-CAPILLARY: 136 mg/dL — AB (ref 70–99)
Glucose-Capillary: 103 mg/dL — ABNORMAL HIGH (ref 70–99)
Glucose-Capillary: 139 mg/dL — ABNORMAL HIGH (ref 70–99)
Glucose-Capillary: 148 mg/dL — ABNORMAL HIGH (ref 70–99)

## 2017-11-12 MED ORDER — POTASSIUM CHLORIDE 10 MEQ/50ML IV SOLN
10.0000 meq | INTRAVENOUS | Status: AC
  Administered 2017-11-12 (×3): 10 meq via INTRAVENOUS
  Filled 2017-11-12 (×3): qty 50

## 2017-11-12 MED ORDER — PANTOPRAZOLE SODIUM 40 MG PO TBEC
40.0000 mg | DELAYED_RELEASE_TABLET | Freq: Every day | ORAL | Status: DC
  Administered 2017-11-13 – 2017-11-22 (×10): 40 mg via ORAL
  Filled 2017-11-12 (×10): qty 1

## 2017-11-12 NOTE — Progress Notes (Signed)
PROGRESS NOTE  Daniel [name]  WEX:[id number] DOB: 1965-05-29 DOA: 10/27/2017 PCP: Antonietta Jewel, MD   Brief Narrative: Daniel [name] is a 52 y.o. male with a history of seizure disorder, IDDM, OSA, OHS, morbid obesity, HTN, IDDM, and PE who presented to [facility] with confusion, headache, hallucinations, and falls found to have hypertensive urgency, started on esmolol gtt, and developed seizures in the ED. He was admitted to [facility] and intubated 9/7. Developed increasing secretions found to have Hemophilus pneumonia, treated with antibiotics, ultimately extubated 9/13. Has had an episode of diminished responsiveness for which neurology was reconsulted and reloaded dilantin due to subtherapeutic level. EEG repeated, showing generalized slowing. Nephrology was consulted for AKI and hypernatremia which are improving with IV fluids. Mental status has overall improved with bouts of delirium. Per oral intake is slowly improving though still requiring IV fluids and aggressive replacement of electrolytes.  Assessment & Plan: Principal Problem:   Recurrent seizures ([place]) Active Problems:   Sleep apnea   Obesity hypoventilation syndrome (HCC)   Hypertension   Status epilepticus ([place])   Fever  Status epilepticus, thought to be hyperglycemia-induced:  - Continue AEDs per neurology: vimpat, dilantin, keppra. Plan to transition to po on discharge. Phenytoin level low, but corrects to goal range 10-20 with hypoalbuminemia.   Hypernatremia:  - Improving. Encourage free water po. Current deficit ~4L - Continue daily monitoring.   IDDM: Uncontrolled with hyperglycemia. HbA1c 15.8%. So his AVERAGE blood sugar has been >400mg /dl. Home lantus (U500) dose is 120 units, so suspect a lot of resistance due to obesity and incomplete adherence (confirmed with wife and parents) given insulin requirements here.  - Continue lantus 10u qHS and resistant SSI (17u total in past 24 hrs). CBGs at goal. Strongly suspect diet is diminished  compared to baseline, may need titration as po picks up.  AKI on stage III CKD: Partially due to urinary retention, hypovolemia. Nephrology consulted, started IVFs, holding diuretic, ACE with improvement. CrCl now well above 61ml/min. SPEP without M-spike; K/L LC ratio normal at 1.36.  - Nephrology signed off 9/20. Monitor UOP, anticipate autodiuresis. Strict I/O. - Will need nephrology follow up for hematuria, proteinuria eventually. UPr:Cr 2.38; most likely due to diabetic nephropathy.  Hypoalbuminemia, suspect malnutrition despite morbid obesity:  - Dietitian consulted; start supplementation with magic cup TIDWC and ensure enlive po BID thickened. Discussed importance of this with patient and family today.  Edema: Mostly due to inactivity, hypoalbuminemia (malnutrition and proteinuria contributing), peripheral IV's/SVT.  - Mostly upper extremities, not dependent, so will hold on diuretic and continue IVF's for now. No evidence of pulmonary edema.  Hypokalemia: Improving with replacement and picking up po intake.  - Replace by IV and continue monitoring, recheck mg given refractory.   Poorly responsive episodes: Possibly post ictal on 9/16, reloaded with dilantin and has improved. No respiratory compromise noted. No seizure activity noted by family at bedside. Resolved.  Acute encephalopathy, acute delirium: ABG has ruled out hypercarbia. Improving. - ?Sedation by AEDs.  - Continue treatment of hypernatremia. - Mobilize as able. PT/OT/SLP.  Dysphagia: Hopeful for improvement with his improved level or alertness. - Continue working with SLP. Continue dysphagia diet and thickened liquids (family educated). - Given coughing while taking po, would have low threshold for repeat CXR, cultures if fever recurs and starting unasyn for presumed aspiration.   Poor IV access: PICC placed 9/19 RUE.  Acute on chronic hypoxic respiratory failure: Ongoing poor respiratory status due to OSA, OHS, morbid  obesity, obstructive lung  disease, CAP. Has been on O2 since 2018 admission for pulmonary infiltrates, possible pneumonia. - Completed antibiotics 9/17.  - CPAP qHS when able - continue supplemental oxygen   Fever: Afebrile since 9/17 off abx. Cortisol, TSH wnl. ID consulted, feel this is more likely drug-related or central fever.   Hypertensive emergency: Improved. Largely at goal. - Continue BP medications.   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  Diarrhea: Has resolved, not a cause of hypokalemia currently. - Negative CDiff  Left cephalic vein SVT:  - Warm compresses. No DVT.  DVT prophylaxis: Heparin SQ Code Status: Full Family Communication: Mother and cousin at the bedside Disposition Plan: SNF soon, hopefully next 24-48 hrs.   Consultants:   Neurology  PCCM  Infectious disease  Nephrology  Procedures:  EEG 9/07 >> focal non convulsive status epilepticus from Rt occipital region LP 9/08 >> RBC 99, WBC 4, glucose 104, protein 81  Cultures/Antimicrobials:  CSF 9/07 >> negative  Blood 9/10 >> negative  Sputum 9/10 >>Haemophilus influenzae  C diff PCR 9/12 >>negative   Vancomycin 9/10 -9/12  Zosyn 9/10 -9/12  Rocephin 9/12 - 9/17  Subjective: Wants to leave as soon as possible. Drinking as much water as they'll give him. Strength is starting to come back. Didn't sleep at all last night.   Objective: Vitals:   11/12/17 0347 11/12/17 0600 11/12/17 0717 11/12/17 0820  BP: 109/61   (!) 154/80  Pulse: 72   77  Resp: 20   20  Temp: 98.2 F (36.8 C)   98.6 F (37 C)  TempSrc: Oral   Oral  SpO2: 96%  96% 98%  Weight: (!) 151 kg (!) 147.6 kg    Height:        Intake/Output Summary (Last 24 hours) at 11/12/2017 1019 Last data filed at 11/12/2017 0650 Gross per 24 hour  Intake 1097.1 ml  Output 1200 ml  Net  -102.9 ml   Filed Weights   11/11/17 0500 11/12/17 0347 11/12/17 0600  Weight: (!) 147.4 kg (!) 151 kg (!) 147.6 kg   Gen: Alert, obese male in no distress Pulm: Nonlabored breathing supplemental oxygen. Clear bilaterally. CV: Regular rate and rhythm. No murmur, rub, or gallop. No JVD, no pitting dependent edema in legs. GI: Abdomen soft, protuberant, non-tender, non-distended, with normoactive bowel sounds.  Ext: Warm, no deformities. 1+ pitting edema to bilateral upper extremities. Limited bilateral shoulder ROM (at baseline, rotator cuff tears) Skin: No rashes, lesions or ulcers on visualized skin. RUE PICC nontender, no erythema, discharge. Neuro: Alert and oriented. Improved diffuse weakness, can lift legs and arms against resistance. No focal neurological deficits. Psych: Judgement and insight appear fair. Mood euthymic & affect congruent. Behavior is appropriate.    Data Reviewed: I have personally reviewed following labs and imaging studies  CBC: Recent Labs  Lab 11/06/17 0452 11/07/17 0626 11/08/17 0403  WBC 8.4 11.0* 9.7  NEUTROABS 4.6 7.0 6.3  HGB 14.2 13.7 12.5*  HCT 48.9 48.1 44.0  MCV 94.4 96.8 97.3  PLT 342 226 109   Basic Metabolic Panel: Recent Labs  Lab 11/06/17 0452  11/07/17 0626 11/08/17 0403 11/08/17 1209 11/09/17 0308 11/10/17 0855 11/11/17 0500 11/12/17 0630  NA 157*   < > 159* 157* 154* 153* 150* 148* 147*  K 3.5   < > 4.3 3.8 4.9 3.5 2.9* 2.7* 3.0*  CL  120*   < > 120* 122* 122* 119* 116* 115* 111  CO2 25   < > 24 28 24 26 25 25 28   GLUCOSE 209*   < > 201* 204* 290* 203* 158* 149* 122*  BUN 47*   < > 50* 45* 39* 33* 19 14 10   CREATININE 1.79*   < > 2.26* 1.96* 1.84* 1.63* 1.35* 1.32* 1.23  CALCIUM 8.7*   < > 8.6* 8.1* 7.6* 7.8* 7.7* 7.5* 7.6*  MG 2.3  --  2.1 2.3  --   --   --  2.0  --   PHOS 4.1  --  5.5* 3.7  --  2.4* 2.0* 2.8 2.6   < > = values in this interval not displayed.   GFR: Estimated Creatinine Clearance: 99.7 mL/min (by C-G  formula based on SCr of 1.23 mg/dL). Liver Function Tests: Recent Labs  Lab 11/08/17 0403 11/09/17 0308 11/10/17 0855 11/11/17 0500 11/12/17 0630  ALBUMIN 2.1* 2.1* 2.0* 1.9* 1.9*   No results for input(s): LIPASE, AMYLASE in the last 168 hours. Recent Labs  Lab 11/07/17 1023  AMMONIA 37*   Coagulation Profile: No results for input(s): INR, PROTIME in the last 168 hours. Cardiac Enzymes: Recent Labs  Lab 11/07/17 0626 11/08/17 1209  CKTOTAL 125 271   BNP (last 3 results) No results for input(s): PROBNP in the last 8760 hours. HbA1C: No results for input(s): HGBA1C in the last 72 hours. CBG: Recent Labs  Lab 11/11/17 1625 11/11/17 1959 11/11/17 2348 11/12/17 0424 11/12/17 0812  GLUCAP 166* 177* 158* 123* 103*   Lipid Profile: No results for input(s): CHOL, HDL, LDLCALC, TRIG, CHOLHDL, LDLDIRECT in the last 72 hours. Thyroid Function Tests: No results for input(s): TSH, T4TOTAL, FREET4, T3FREE, THYROIDAB in the last 72 hours. Anemia Panel: No results for input(s): VITAMINB12, FOLATE, FERRITIN, TIBC, IRON, RETICCTPCT in the last 72 hours. Urine analysis:    Component Value Date/Time   COLORURINE YELLOW 11/07/2017 1809   APPEARANCEUR HAZY (A) 11/07/2017 1809   LABSPEC 1.020 11/07/2017 1809   PHURINE 5.0 11/07/2017 1809   GLUCOSEU 50 (A) 11/07/2017 1809   HGBUR MODERATE (A) 11/07/2017 1809   BILIRUBINUR NEGATIVE 11/07/2017 1809   KETONESUR NEGATIVE 11/07/2017 1809   PROTEINUR 100 (A) 11/07/2017 1809   NITRITE NEGATIVE 11/07/2017 1809   LEUKOCYTESUR NEGATIVE 11/07/2017 1809   Recent Results (from the past 240 hour(s))  Culture, blood (Routine X 2) w Reflex to ID Panel     Status: None   Collection Time: 11/06/17 10:50 AM  Result Value Ref Range Status   Specimen Description BLOOD RIGHT ANTECUBITAL  Final   Special Requests   Final    BOTTLES DRAWN AEROBIC ONLY Blood Culture results may not be optimal due to an inadequate volume of blood received in culture  bottles   Culture   Final    NO GROWTH 5 DAYS Performed at [facility], [place] [address], [place], [place] [address]    Report Status 11/11/2017 FINAL  Final  Culture, blood (Routine X 2) w Reflex to ID Panel     Status: None   Collection Time: 11/06/17 11:19 AM  Result Value Ref Range Status   Specimen Description BLOOD RIGHT ANTECUBITAL  Final   Special Requests   Final    BOTTLES DRAWN AEROBIC ONLY Blood Culture adequate volume   Culture   Final    NO GROWTH 5 DAYS Performed at [facility], [place] [address], [place], [place]  [address]    Report Status 11/11/2017 FINAL  Final  Urine Culture     Status: None   Collection Time: 11/06/17  6:52 PM  Result Value Ref Range Status   Specimen Description URINE, CATHETERIZED  Final   Special Requests NONE  Final   Culture   Final    NO GROWTH Performed at [facility], [address] [place], [place] [address]    Report Status 11/08/2017 FINAL  Final      Radiology Studies: No results found.  Scheduled Meds: . aspirin  81 mg Oral Daily  . budesonide (PULMICORT) nebulizer solution  0.25 mg Nebulization BID  . chlorhexidine  15 mL Mouth Rinse BID  . feeding supplement (ENSURE ENLIVE)  237 mL Oral BID BM  . heparin injection (subcutaneous)  5,000 Units Subcutaneous Q8H  . hydrALAZINE  50 mg Oral Q8H  . insulin aspart  0-20 Units Subcutaneous Q4H  . insulin glargine  10 Units Subcutaneous QHS  . levothyroxine  50 mcg Oral QAC breakfast  . mouth rinse  15 mL Mouth Rinse q12n4p  . metoprolol succinate  50 mg Oral Daily  . pantoprazole sodium  40 mg Per Tube Q24H  . sodium chloride flush  10-40 mL Intracatheter Q12H   Continuous Infusions: . lacosamide (VIMPAT) IV 200 mg (11/12/17 0917)  . levETIRAcetam 1,000 mg (11/12/17 0801)  . phenytoin (DILANTIN) IV 175 mg (11/12/17 0600)  . sodium chloride 0.45 % with kcl 75 mL/hr at 11/12/17 0538     LOS: 16 days   Time spent: 35 minutes.  Patrecia Pour, MD [facility] [email or web address] Password [facility] 11/12/2017, 10:19 AM

## 2017-11-13 ENCOUNTER — Other Ambulatory Visit: Payer: Self-pay

## 2017-11-13 ENCOUNTER — Encounter (HOSPITAL_COMMUNITY): Payer: Self-pay | Admitting: General Practice

## 2017-11-13 LAB — RENAL FUNCTION PANEL
ANION GAP: 8 (ref 5–15)
Albumin: 1.9 g/dL — ABNORMAL LOW (ref 3.5–5.0)
BUN: 7 mg/dL (ref 6–20)
CALCIUM: 8 mg/dL — AB (ref 8.9–10.3)
CO2: 24 mmol/L (ref 22–32)
CREATININE: 1.09 mg/dL (ref 0.61–1.24)
Chloride: 112 mmol/L — ABNORMAL HIGH (ref 98–111)
Glucose, Bld: 138 mg/dL — ABNORMAL HIGH (ref 70–99)
Phosphorus: 3.4 mg/dL (ref 2.5–4.6)
Potassium: 3.8 mmol/L (ref 3.5–5.1)
SODIUM: 144 mmol/L (ref 135–145)

## 2017-11-13 LAB — GLUCOSE, CAPILLARY
GLUCOSE-CAPILLARY: 117 mg/dL — AB (ref 70–99)
GLUCOSE-CAPILLARY: 142 mg/dL — AB (ref 70–99)
Glucose-Capillary: 143 mg/dL — ABNORMAL HIGH (ref 70–99)
Glucose-Capillary: 126 mg/dL — ABNORMAL HIGH (ref 70–99)
Glucose-Capillary: 165 mg/dL — ABNORMAL HIGH (ref 70–99)
Glucose-Capillary: 181 mg/dL — ABNORMAL HIGH (ref 70–99)
Glucose-Capillary: 150 mg/dL — ABNORMAL HIGH (ref 70–99)
Glucose-Capillary: 141 mg/dL — ABNORMAL HIGH (ref 70–99)

## 2017-11-13 LAB — MAGNESIUM: MAGNESIUM: 1.8 mg/dL (ref 1.7–2.4)

## 2017-11-13 LAB — IMMUNOFIXATION, URINE

## 2017-11-13 MED ORDER — HYDRALAZINE HCL 50 MG PO TABS
100.0000 mg | ORAL_TABLET | Freq: Three times a day (TID) | ORAL | Status: DC
  Administered 2017-11-13 – 2017-11-22 (×24): 100 mg via ORAL
  Filled 2017-11-13 (×26): qty 2

## 2017-11-13 NOTE — Progress Notes (Addendum)
PROGRESS NOTE  Daniel [name]  VHQ:[id number] DOB: 1965/08/26 DOA: 10/27/2017 PCP: Antonietta Jewel, MD   Brief Narrative: Daniel [name] is a 52 y.o. male with a history of seizure disorder, IDDM, OSA, OHS, morbid obesity, HTN, IDDM, and PE who presented to [facility] with confusion, headache, hallucinations, and falls found to have hypertensive urgency, started on esmolol gtt, and developed seizures in the ED. He was admitted to [facility] and intubated 9/7. Developed increasing secretions found to have Hemophilus pneumonia, treated with antibiotics, ultimately extubated 9/13. Has had an episode of diminished responsiveness for which neurology was reconsulted and reloaded dilantin due to subtherapeutic level. EEG repeated, showing generalized slowing. Nephrology was consulted for AKI and hypernatremia which are improving with IV fluids. Mental status has overall improved with bouts of delirium. Per oral intake is slowly improving though still requiring IV fluids and aggressive replacement of electrolytes.  Assessment & Plan: Principal Problem:   Recurrent seizures ([place]) Active Problems:   Sleep apnea   Obesity hypoventilation syndrome (HCC)   Hypertension   Status epilepticus ([place])   Fever  Status epilepticus, thought to be hyperglycemia-induced:  - Continue AEDs per neurology: vimpat, dilantin, keppra. Plan to transition to po on discharge. Phenytoin level low, but corrects to goal range 10-20 with hypoalbuminemia.   Hypernatremia: Resolved with IVF and po intake. - Continue daily monitoring. Encourage po. If stable in AM, can DC IVF's and monitor.  IDDM: Uncontrolled with hyperglycemia. HbA1c 15.8%. So his AVERAGE blood sugar has been >400mg /dl. Home lantus (U500) dose is 120 units, so suspect a lot of resistance due to obesity and incomplete adherence (confirmed with wife and parents) given insulin requirements here.  - Continue lantus 10u qHS and resistant SSI. CBGs at goal. Strongly suspect diet is  diminished compared to baseline, may need titration as po picks up. Remains at inpatient goal.  AKI on stage III CKD: Partially due to urinary retention, hypovolemia. Nephrology consulted, started IVFs, holding diuretic, ACE with improvement. CrCl now well above 28ml/min. SPEP without M-spike; K/L LC ratio normal at 1.36.  - Nephrology signed off 9/20. Monitor UOP, anticipate autodiuresis. Strict I/O. - Will need nephrology follow up for hematuria, proteinuria eventually. UPr:Cr 2.38; most likely due to diabetic nephropathy.  Hypoalbuminemia, suspect malnutrition despite morbid obesity:  - Dietitian consulted; start supplementation with magic cup TIDWC and ensure enlive po BID thickened.    Edema: Mostly due to inactivity, hypoalbuminemia (malnutrition and proteinuria contributing), peripheral IV's/SVT.  - Mostly upper extremities, not dependent, so will hold on diuretic and continue IVF's for now. No evidence of pulmonary edema.  Hypokalemia: Improving with replacement and picking up po intake.  - Continue monitoring and as needed replacement. Give Mg today, recheck in AM.  Poorly responsive episodes: Possibly post ictal on 9/16, reloaded with dilantin and has improved. No respiratory compromise noted. No seizure activity noted by family at bedside. Resolved.  Acute encephalopathy, acute delirium: ABG has ruled out hypercarbia. Improving significantly. - Mobilize as able. PT/OT/SLP.  Dysphagia: Hopeful for improvement with his improved level or alertness. - Continue working with SLP. Continue dysphagia diet and thickened liquids (family educated).  Poor IV access: PICC placed 9/19 RUE.  Acute on chronic hypoxic respiratory failure: Ongoing poor respiratory status due to OSA, OHS, morbid obesity, obstructive lung disease, CAP. Has been on O2 since 2018 admission for pulmonary infiltrates, possible pneumonia. - Completed antibiotics 9/17.  - CPAP qHS when able - Continue supplemental  oxygen   Fever: Afebrile since 9/17 off  abx. Cortisol, TSH wnl. ID consulted, feel this is more likely drug-related or central fever.   Hypertensive emergency: Improved. Largely at goal. - Continue BP medications.   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 Increase hydralazine to 100mg  q8h today.  Hypothyroidism: TSH 1.415 - Continue synthroid.  Diarrhea: Has resolved, not a cause of hypokalemia currently. - Negative CDiff  Left cephalic vein SVT:  - Warm compresses. No DVT.  DVT prophylaxis: Heparin SQ Code Status: Full Family Communication: Wife and uncle at bedside today. Disposition Plan: SNF once bed available.   Consultants:   Neurology  PCCM  Infectious disease  Nephrology  Procedures:  EEG 9/07 >> focal non convulsive status epilepticus from Rt occipital region LP 9/08 >> RBC 99, WBC 4, glucose 104, protein 81  Cultures/Antimicrobials:  CSF 9/07 >> negative  Blood 9/10 >> negative  Sputum 9/10 >>Haemophilus influenzae  C diff PCR 9/12 >>negative   Vancomycin 9/10 -9/12  Zosyn 9/10 -9/12  Rocephin 9/12 - 9/17  Subjective: Feels well, but didn't sleep and desperately wants to get out of bed.   Objective: Vitals:   11/13/17 0350 11/13/17 0752 11/13/17 0800 11/13/17 1130  BP: (!) 157/88  (!) 152/85 (!) 171/86  Pulse: 73  79 83  Resp: 18  20   Temp: 98 F (36.7 C)  98.2 F (36.8 C)   TempSrc: Oral  Oral   SpO2: 96% 94%  95%  Weight:      Height:        Intake/Output Summary (Last 24 hours) at 11/13/2017 1602 Last data filed at 11/13/2017 1415 Gross per 24 hour  Intake 2289.54 ml  Output 1150 ml  Net 1139.54 ml   Filed Weights   11/11/17 0500 11/12/17 0347 11/12/17 0600  Weight: (!) 147.4 kg (!) 151 kg (!) 147.6 kg   Gen: Interactive obese male in no distress Pulm: Nonlabored breathing, currently on room air. Clear. CV: Regular rate and rhythm. No murmur,  rub, or gallop. No definite JVD, trace dependent edema. GI: Abdomen soft, non-tender, non-distended, with normoactive bowel sounds.  Ext: Warm, no deformities. Swelling in hands stable.  Skin: No new rashes, lesions or ulcers on visualized skin.  Neuro: Alert and oriented. Generally more AROM throughout with improved strength antigravity and resistance. No focal neurological deficits. Psych: Judgement and insight appear fair. Mood euthymic & affect congruent. Behavior is appropriate.    Data Reviewed: I have personally reviewed following labs and imaging studies  CBC: Recent Labs  Lab 11/07/17 0626 11/08/17 0403  WBC 11.0* 9.7  NEUTROABS 7.0 6.3  HGB 13.7 12.5*  HCT 48.1 44.0  MCV 96.8 97.3  PLT 226 174   Basic Metabolic Panel: Recent Labs  Lab 11/07/17 0626 11/08/17 0403  11/09/17 0308 11/10/17 0855 11/11/17 0500 11/12/17 0630 11/13/17 0746  NA 159* 157*   < > 153* 150* 148* 147* 144  K 4.3 3.8   < > 3.5 2.9* 2.7* 3.0* 3.8  CL 120* 122*   < > 119* 116* 115* 111 112*  CO2 24 28   < > 26 25 25 28 24   GLUCOSE 201* 204*   < > 203* 158* 149* 122* 138*  BUN 50* 45*   < > 33* 19 14 10 7   CREATININE 2.26* 1.96*   < > 1.63* 1.35* 1.32* 1.23 1.09  CALCIUM 8.6* 8.1*   < > 7.8* 7.7* 7.5* 7.6* 8.0*  MG 2.1 2.3  --   --   --  2.0  --  1.8  PHOS 5.5* 3.7  --  2.4* 2.0* 2.8 2.6 3.4   < > = values in this interval not displayed.   GFR: Estimated Creatinine Clearance: 112.5 mL/min (by C-G formula based on SCr of 1.09 mg/dL). Liver Function Tests: Recent Labs  Lab 11/09/17 0308 11/10/17 0855 11/11/17 0500 11/12/17 0630 11/13/17 0746  ALBUMIN 2.1* 2.0* 1.9* 1.9* 1.9*   No results for input(s): LIPASE, AMYLASE in the last 168 hours. Recent Labs  Lab 11/07/17 1023  AMMONIA 37*   Coagulation Profile: No results for input(s): INR, PROTIME in the last 168 hours. Cardiac Enzymes: Recent Labs  Lab 11/07/17 0626 11/08/17 1209  CKTOTAL 125 271   BNP (last 3 results) No  results for input(s): PROBNP in the last 8760 hours. HbA1C: No results for input(s): HGBA1C in the last 72 hours. CBG: Recent Labs  Lab 11/12/17 2017 11/13/17 0027 11/13/17 0408 11/13/17 0753 11/13/17 1130  GLUCAP 148* 142* 117* 126* 165*   Lipid Profile: No results for input(s): CHOL, HDL, LDLCALC, TRIG, CHOLHDL, LDLDIRECT in the last 72 hours. Thyroid Function Tests: No results for input(s): TSH, T4TOTAL, FREET4, T3FREE, THYROIDAB in the last 72 hours. Anemia Panel: No results for input(s): VITAMINB12, FOLATE, FERRITIN, TIBC, IRON, RETICCTPCT in the last 72 hours. Urine analysis:    Component Value Date/Time   COLORURINE YELLOW 11/07/2017 1809   APPEARANCEUR HAZY (A) 11/07/2017 1809   LABSPEC 1.020 11/07/2017 1809   PHURINE 5.0 11/07/2017 1809   GLUCOSEU 50 (A) 11/07/2017 1809   HGBUR MODERATE (A) 11/07/2017 1809   BILIRUBINUR NEGATIVE 11/07/2017 1809   KETONESUR NEGATIVE 11/07/2017 1809   PROTEINUR 100 (A) 11/07/2017 1809   NITRITE NEGATIVE 11/07/2017 1809   LEUKOCYTESUR NEGATIVE 11/07/2017 1809   Recent Results (from the past 240 hour(s))  Culture, blood (Routine X 2) w Reflex to ID Panel     Status: None   Collection Time: 11/06/17 10:50 AM  Result Value Ref Range Status   Specimen Description BLOOD RIGHT ANTECUBITAL  Final   Special Requests   Final    BOTTLES DRAWN AEROBIC ONLY Blood Culture results may not be optimal due to an inadequate volume of blood received in culture bottles   Culture   Final    NO GROWTH 5 DAYS Performed at [facility], [place] [address], [place], [place] [address]    Report Status 11/11/2017 FINAL  Final  Culture, blood (Routine X 2) w Reflex to ID Panel     Status: None   Collection Time: 11/06/17 11:19 AM  Result Value Ref Range Status   Specimen Description BLOOD RIGHT ANTECUBITAL  Final   Special Requests   Final    BOTTLES DRAWN AEROBIC ONLY Blood Culture adequate volume   Culture   Final    NO GROWTH 5 DAYS Performed at  [facility], [place] [address], [place], [place] [address]    Report Status 11/11/2017 FINAL  Final  Urine Culture     Status: None   Collection Time: 11/06/17  6:52 PM  Result Value Ref Range Status   Specimen Description URINE, CATHETERIZED  Final   Special Requests NONE  Final   Culture   Final    NO GROWTH Performed at [facility], [place] [address], [place], [place] [address]    Report Status 11/08/2017 FINAL  Final      Radiology Studies: No results found.  Scheduled Meds: . aspirin  81 mg Oral Daily  .  budesonide (PULMICORT) nebulizer solution  0.25 mg Nebulization BID  . chlorhexidine  15 mL Mouth Rinse BID  . feeding supplement (ENSURE ENLIVE)  237 mL Oral BID BM  . heparin injection (subcutaneous)  5,000 Units Subcutaneous Q8H  . hydrALAZINE  50 mg Oral Q8H  . insulin aspart  0-20 Units Subcutaneous Q4H  . insulin glargine  10 Units Subcutaneous QHS  . levothyroxine  50 mcg Oral QAC breakfast  . mouth rinse  15 mL Mouth Rinse q12n4p  . metoprolol succinate  50 mg Oral Daily  . pantoprazole  40 mg Oral Daily  . sodium chloride flush  10-40 mL Intracatheter Q12H   Continuous Infusions: . lacosamide (VIMPAT) IV 200 mg (11/13/17 1150)  . levETIRAcetam 1,000 mg (11/13/17 1025)  . phenytoin (DILANTIN) IV 175 mg (11/13/17 1532)  . sodium chloride 0.45 % with kcl 75 mL/hr at 11/13/17 0852     LOS: 17 days   Time spent: 25 minutes.  Patrecia Pour, MD [facility] [email or web address] Password [facility] 11/13/2017, 4:02 PM

## 2017-11-13 NOTE — NC FL2 (Signed)
[place] LEVEL OF CARE SCREENING TOOL     IDENTIFICATION  Patient Name: Daniel [name] Birthdate: [birth year] Sex: male Admission Date (Current Location): 10/27/2017  [facility] and [place] Number:  Herbalist and Address:  [facility] [place], [place] [address] [place], [place] [address]      Provider Number: [id number]  Attending Physician Name and Address:  Patrecia Pour, MD  Relative Name and Phone Number:       Current Level of Care: Hospital Recommended Level of Care: [place] Prior Approval Number:    Date Approved/Denied:   PASRR Number:    Discharge Plan: SNF    Current Diagnoses: Patient Active Problem List   Diagnosis Date Noted  . Fever   . Status epilepticus ([place])   . Recurrent seizures ([place]) 10/27/2017  . Altered sensorium 10/27/2017  . Dyspnea on exertion 08/14/2017  . Chronic respiratory failure with hypoxia ([place]) 08/14/2017  . Sleep apnea 08/14/2017  . Obesity hypoventilation syndrome ([place]) 08/14/2017  . Fatigue 08/14/2017  . Hypertension 08/14/2017  . Hyperlipidemia 12/23/2016  . Hypothyroid 12/23/2016  . Acute combined systolic and diastolic congestive heart failure ([place]) 06/24/2016  . CKD (chronic kidney disease), stage III ([place]) 06/24/2016  . Type 2 diabetes mellitus without complications ([place]) 21/30/8657    Orientation RESPIRATION BLADDER Height & Weight     Self  Normal Incontinent, External catheter Weight: (!) 147.6 kg Height:  5\' 9"  (175.3 cm)  BEHAVIORAL SYMPTOMS/MOOD NEUROLOGICAL BOWEL NUTRITION STATUS    Convulsions/Seizures, Grand mal(keppra/ vimpat/ dilantin) Incontinent Diet(dysphagia 2 with nectar thick liquids)  AMBULATORY STATUS COMMUNICATION OF NEEDS Skin   Extensive Assist Verbally Other (Comment)(MASD in groin area)                       Personal Care Assistance Level of Assistance  Bathing, Feeding, Dressing Bathing Assistance: Maximum assistance Feeding  assistance: Maximum assistance Dressing Assistance: Maximum assistance     Functional Limitations Info  Sight, Hearing, Speech Sight Info: Adequate Hearing Info: Adequate Speech Info: Adequate    SPECIAL CARE FACTORS FREQUENCY  PT (By licensed PT), OT (By licensed OT), Speech therapy     PT Frequency: 5x/wk OT Frequency: 5x/wk     Speech Therapy Frequency: 5x/wk      Contractures Contractures Info: Not present    Additional Factors Info  Code Status, Allergies, Insulin Sliding Scale Code Status Info: full Allergies Info: MONTELUKAST    Insulin Sliding Scale Info: Novolog 0-20 units every 4 hours as needed/ Lantus 10 units at bedtime       Current Medications (11/13/2017):  This is the current hospital active medication list Current Facility-Administered Medications  Medication Dose Route Frequency Provider Last Rate Last Dose  . acetaminophen (TYLENOL) tablet 650 mg  650 mg Oral Q6H PRN Patrecia Pour, MD   650 mg at 11/06/17 2014   Or  . acetaminophen (TYLENOL) suppository 650 mg  650 mg Rectal Q6H PRN Patrecia Pour, MD   650 mg at 11/07/17 1650  . albuterol (PROVENTIL) (2.5 MG/3ML) 0.083% nebulizer solution 3 mL  3 mL Inhalation Q4H PRN Patrecia Pour, MD      . aspirin chewable tablet 81 mg  81 mg Oral Daily Patrecia Pour, MD   81 mg at 11/13/17 1013  . bisacodyl (DULCOLAX) suppository 10 mg  10 mg Rectal Daily PRN Patrecia Pour, MD      . budesonide (PULMICORT)  nebulizer solution 0.25 mg  0.25 mg Nebulization BID Vance Gather B, MD   0.25 mg at 11/13/17 0752  . chlorhexidine (PERIDEX) 0.12 % solution 15 mL  15 mL Mouth Rinse BID Patrecia Pour, MD   15 mL at 11/12/17 2142  . feeding supplement (ENSURE ENLIVE) (ENSURE ENLIVE) liquid 237 mL  237 mL Oral BID BM Vance Gather B, MD   237 mL at 11/13/17 1025  . heparin injection 5,000 Units  5,000 Units Subcutaneous Q8H Patrecia Pour, MD   5,000 Units at 11/13/17 1408  . hydrALAZINE (APRESOLINE) injection 10 mg  10 mg Intravenous  Q4H PRN Patrecia Pour, MD   10 mg at 11/11/17 0531  . hydrALAZINE (APRESOLINE) tablet 100 mg  100 mg Oral Q8H Vance Gather B, MD      . insulin aspart (novoLOG) injection 0-20 Units  0-20 Units Subcutaneous Q4H Patrecia Pour, MD   4 Units at 11/13/17 1158  . insulin glargine (LANTUS) injection 10 Units  10 Units Subcutaneous QHS Patrecia Pour, MD   10 Units at 11/12/17 2139  . labetalol (NORMODYNE,TRANDATE) injection 10 mg  10 mg Intravenous Q2H PRN Patrecia Pour, MD   10 mg at 11/04/17 1205  . lacosamide (VIMPAT) 200 mg in sodium chloride 0.9 % 25 mL IVPB  200 mg Intravenous Q12H Vance Gather B, MD 90 mL/hr at 11/13/17 1150 200 mg at 11/13/17 1150  . levETIRAcetam (KEPPRA) IVPB 1000 mg/100 mL premix  1,000 mg Intravenous Q12H Vance Gather B, MD 400 mL/hr at 11/13/17 1025 1,000 mg at 11/13/17 1025  . levothyroxine (SYNTHROID, LEVOTHROID) tablet 50 mcg  50 mcg Oral QAC breakfast Patrecia Pour, MD   50 mcg at 11/13/17 0854  . MEDLINE mouth rinse  15 mL Mouth Rinse q12n4p Patrecia Pour, MD   15 mL at 11/11/17 1630  . metoprolol succinate (TOPROL-XL) 24 hr tablet 50 mg  50 mg Oral Daily Patrecia Pour, MD   50 mg at 11/13/17 1013  . pantoprazole (PROTONIX) EC tablet 40 mg  40 mg Oral Daily Patrecia Pour, MD   40 mg at 11/13/17 1013  . phenytoin (DILANTIN) 175 mg in sodium chloride 0.9 % 100 mL IVPB  175 mg Intravenous Q8H Patrecia Pour, MD 207 mL/hr at 11/13/17 1532 175 mg at 11/13/17 1532  . [place]   Oral PRN Vance Gather B, MD      . sodium chloride 0.45 % 1,000 mL with potassium chloride 40 mEq infusion   Intravenous Continuous Patrecia Pour, MD 75 mL/hr at 11/13/17 2706    . sodium chloride flush (NS) 0.9 % injection 10-40 mL  10-40 mL Intracatheter Q12H Patrecia Pour, MD   10 mL at 11/12/17 2143  . sodium chloride flush (NS) 0.9 % injection 10-40 mL  10-40 mL Intracatheter PRN Patrecia Pour, MD         Discharge Medications: Please see discharge summary for a list of discharge  medications.  Relevant Imaging Results:  Relevant Lab Results:   Additional Information SS#: [id number]  Pollie Friar, RN

## 2017-11-13 NOTE — Progress Notes (Signed)
CM attempted to reach patient's spouse for d/c planning needs. CM left voicemail for call back. Pt is without insurance and has medicaid pending. CM following.

## 2017-11-13 NOTE — Progress Notes (Signed)
Physical Therapy Treatment Patient Details Name: Daniel [name] MRN: [id number] DOB: 02-01-1966 Today's Date: 11/13/2017    History of Present Illness 52 yo male presented to [facility] with altered mental status, confusion, headaches, hallucinations, and falls. SBP was over 200. In ER at [facility] he was started on esmolol gtt. Developed seizure in [place] at [place]. He continued to have seizures and transferred to [facility]. He was started on burst suppression with versed and required intubation 9/07. Developed fever and increased sputum 9/10.  Intubated 9/10-9/13.      PT Comments    Patient more alert and talkative today. Tolerated standing in vital go bed at 52 degrees with ~51% WB through BLEs. Able to perform mini squats and calf raises in partial standing and also work on activating core and finding midline. Eager to begin gait training. Still with impaired attention, bouts of confusion and poor awareness but all improving from prior sessions. Will continue to follow and progress as tolerated.    Follow Up Recommendations  SNF;Supervision/Assistance - 24 hour     Equipment Recommendations  None recommended by PT    Recommendations for Other Services       Precautions / Restrictions Precautions Precautions: Fall Restrictions Weight Bearing Restrictions: No    Mobility  Bed Mobility Overal bed mobility: Needs Assistance Bed Mobility: Rolling Rolling: Min assist         General bed mobility comments: Assist to roll to right to adjust pads using rail.  Transfers                 General transfer comment: Tilt bed utilized for standing and chair position.  Ambulation/Gait             General Gait Details: unable   Stairs             Wheelchair Mobility    Modified Rankin (Stroke Patients Only)       Balance Overall balance assessment: Needs assistance Sitting-balance support: Feet supported;Bilateral upper extremity supported Sitting  balance-Leahy Scale: Zero Sitting balance - Comments: Worked on pulling trunk up to sitting with tilt bed in chair position using BUEs and Max A of 2 for support/assist. Notable to maintain without external support.                                    Cognition Arousal/Alertness: Awake/alert Behavior During Therapy: WFL for tasks assessed/performed Overall Cognitive Status: Impaired/Different from baseline Area of Impairment: Attention                 Orientation Level: Disoriented to;Place Current Attention Level: Selective   Following Commands: Follows one step commands with increased time Safety/Judgement: Decreased awareness of safety;Decreased awareness of deficits   Problem Solving: Slow processing;Requires verbal cues General Comments: very talkative and appropriately joking today at times. Shocked to find out he was at the hospital, thought he was at home.      Exercises General Exercises - Lower Extremity Heel Raises: Both;15 reps;Standing;Strengthening(in tilt bed) Mini-Sqauts: 15 reps;Both;Strengthening;Standing(in tilt bed) Other Exercises Other Exercises: Worked on standing tolerance- QS and mini squats in tilt bed at 52 degrees.    General Comments General comments (skin integrity, edema, etc.): Wife present during session.      Pertinent Vitals/Pain Pain Assessment: No/denies pain    Home Living  Prior Function            PT Goals (current goals can now be found in the care plan section) Progress towards PT goals: Progressing toward goals    Frequency    Min 3X/week      PT Plan Current plan remains appropriate    Co-evaluation              AM-PAC PT "6 Clicks" Daily Activity  Outcome Measure  Difficulty turning over in bed (including adjusting bedclothes, sheets and blankets)?: A Little Difficulty moving from lying on back to sitting on the side of the bed? : Unable Difficulty sitting  down on and standing up from a chair with arms (e.g., wheelchair, bedside commode, etc,.)?: Unable Help needed moving to and from a bed to chair (including a wheelchair)?: Total Help needed walking in hospital room?: Total Help needed climbing 3-5 steps with a railing? : Total 6 Click Score: 8    End of Session   Activity Tolerance: Patient tolerated treatment well Patient left: in bed;with call bell/phone within reach;with family/visitor present(in chair position) Nurse Communication: Mobility status;Other (comment)(how to work the bed to return to supine after 1 hour) PT Visit Diagnosis: Muscle weakness (generalized) (M62.81)     Time: 1331-1410 PT Time Calculation (min) (ACUTE ONLY): 39 min  Charges:  $Therapeutic Exercise: 8-22 mins $Therapeutic Activity: 23-37 mins                     Wray Kearns, [place], DPT Acute Rehabilitation Services Pager [phone] Office [phone]       Marguarite Arbour A Sabra Heck 11/13/2017, 3:52 PM

## 2017-11-13 NOTE — Progress Notes (Signed)
  Speech Language Pathology Treatment: Dysphagia  Patient Details Name: Daniel [name] MRN: [id number] DOB: [birth year] Today's Date: 11/13/2017 Time: 3606-7703 SLP Time Calculation (min) (ACUTE ONLY): 40 min  Assessment / Plan / Recommendation Clinical Impression  Patient seen during breakfast meal. Patient was slightly confused as he had just woken, but his confusion cleared as the session progressed.  Patient refused to try any foods on tray, he stated "none of them taste good". Patient was given trials of thin water with no clearing, coughing, however with trials of milk patient had an immediate strong cough and watery eyes with each sip. Patient was given trials of graham crackers and graham crackers with peanut butter. He exhibited prolonged mastication and bolus control with effective oral clearance. Nectar thickened coffee given with no clearing, coughing or watery eyes. Education with wife on his immediate cough response to the milk and the risk thin liquids present for aspiration. Recommend Dys 3, nectar thickened liquids, medication whole with liquids.   HPI HPI: 52 yo male presented to [place] with altered mental status, confusion, headaches, hallucinations, and falls.  SBP was over 200.  In ER at [facility] he was started on esmolol gtt.  Developed seizure in [place] at [place].  He continued to have seizures and transferred to [facility].  He was started on burst suppression with versed and required intubation 9/07.  Developed fever and increased sputum 9/10. Extubated 9/13.       SLP Plan  Continue with current plan of care       Recommendations  Diet recommendations: Dysphagia 3 (mechanical soft);Nectar-thick liquid Liquids provided via: Cup;Straw Medication Administration: Whole meds with liquid Supervision: Staff to assist with self feeding Compensations: Slow rate;Small sips/bites Postural Changes and/or Swallow Maneuvers: Seated upright 90 degrees                Oral  Care Recommendations: Oral care BID SLP Visit Diagnosis: Dysphagia, oropharyngeal phase (R13.12) Plan: Continue with current plan of care       [place], MA, CCC-SLP 11/13/2017 9:38 AM

## 2017-11-14 LAB — RENAL FUNCTION PANEL
ALBUMIN: 2 g/dL — AB (ref 3.5–5.0)
ANION GAP: 9 (ref 5–15)
BUN: 8 mg/dL (ref 6–20)
CO2: 27 mmol/L (ref 22–32)
Calcium: 7.8 mg/dL — ABNORMAL LOW (ref 8.9–10.3)
Chloride: 106 mmol/L (ref 98–111)
Creatinine, Ser: 1.16 mg/dL (ref 0.61–1.24)
GFR calc Af Amer: >60 mL/min (ref >60)
GFR calc non Af Amer: >60 mL/min (ref >60)
GLUCOSE: 150 mg/dL — AB (ref 70–99)
PHOSPHORUS: 3 mg/dL (ref 2.5–4.6)
Potassium: 3.2 mmol/L — ABNORMAL LOW (ref 3.5–5.1)
SODIUM: 142 mmol/L (ref 135–145)

## 2017-11-14 LAB — GLUCOSE, CAPILLARY
GLUCOSE-CAPILLARY: 203 mg/dL — AB (ref 70–99)
Glucose-Capillary: 150 mg/dL — ABNORMAL HIGH (ref 70–99)
Glucose-Capillary: 118 mg/dL — ABNORMAL HIGH (ref 70–99)
Glucose-Capillary: 189 mg/dL — ABNORMAL HIGH (ref 70–99)
Glucose-Capillary: 162 mg/dL — ABNORMAL HIGH (ref 70–99)
Glucose-Capillary: 180 mg/dL — ABNORMAL HIGH (ref 70–99)

## 2017-11-14 MED ORDER — POTASSIUM CHLORIDE CRYS ER 20 MEQ PO TBCR
40.0000 meq | EXTENDED_RELEASE_TABLET | Freq: Once | ORAL | Status: AC
  Administered 2017-11-14: 40 meq via ORAL
  Filled 2017-11-14: qty 2

## 2017-11-14 NOTE — Progress Notes (Signed)
  Speech Language Pathology Treatment: Dysphagia  Patient Details Name: Daniel [name] MRN: [id number] DOB: Jul 06, 1965 Today's Date: 11/14/2017 Time: 2637-8588 SLP Time Calculation (min) (ACUTE ONLY): 29 min  Assessment / Plan / Recommendation Clinical Impression  Patient continuing to improve in level of alertness as well as with improved vocal quality. Patient alert, oriented, interactive, and able to self feed. Provided with trials of dysphagia 2, 3, and regular texture solids. Patient with appropriate, swift oral transit of bolus and no obvious s/s of aspiration. With nectar thick liquids however, patient with inconsistent coughing noted post swallow, improved with trials of honey thick via small cup sip. Discussed with patient and mother. Patient with plans to d/c to SNF ASAP (no bed offers yet). Although patient continues to present with evidence of aspiration with thinner consistency liquids, patient is now more alert and may be able to utilize compensatory strategies to mitigate risks for potential upgrade, something he was not able to do previously during FEES due to AMS. Plan for FEES prior to d/c to SNF (in am 9/25) to determine potential to advance. For now, will upgrade solids and downgrade liquids to maximize safety with po intake.    HPI HPI: 52 yo male presented to [place] with altered mental status, confusion, headaches, hallucinations, and falls.  SBP was over 200.  In ER at [facility] he was started on esmolol gtt.  Developed seizure in [place] at [place].  He continued to have seizures and transferred to [facility].  He was started on burst suppression with versed and required intubation 9/07.  Developed fever and increased sputum 9/10. Extubated 9/13.       SLP Plan  (FEES)       Recommendations  Diet recommendations: Dysphagia 3 (mechanical soft);Honey-thick liquid Liquids provided via: Cup Medication Administration: Whole meds with puree Supervision: Patient able to self  feed;Full supervision/cueing for compensatory strategies Compensations: Slow rate;Small sips/bites Postural Changes and/or Swallow Maneuvers: Seated upright 90 degrees                Oral Care Recommendations: Oral care BID Follow up Recommendations: Skilled Nursing facility SLP Visit Diagnosis: Dysphagia, oropharyngeal phase (R13.12) Plan: (FEES)       GO             Daniel Rainwater MA, CCC-SLP     Daniel [name] Daniel [name] 11/14/2017, 9:36 AM

## 2017-11-14 NOTE — Clinical Social Work Note (Signed)
Clinical Social Work Assessment  Patient Details  Name: Daniel [name] MRN: [id number] Date of Birth: Apr 30, 1965  Date of referral:  11/14/17               Reason for consult:  Facility Placement                Permission sought to share information with:  Facility Sport and exercise psychologist, Family Supports Permission granted to share information::  Yes, Verbal Permission Granted  Name::     [name]  Agency::  SNF  Relationship::  Mother, Wife  Contact Information:     Housing/Transportation Living arrangements for the past 2 months:  Single Family Home Source of Information:  Patient, Medical Team, Parent Patient Interpreter Needed:  None Criminal Activity/Legal Involvement Pertinent to Current Situation/Hospitalization:  No - Comment as needed Significant Relationships:  Spouse, Parents Lives with:  Self, Spouse Do you feel safe going back to the place where you live?  Yes Need for family participation in patient care:  No (Coment)  Care giving concerns:  Patient from home with spouse, but will need short term rehab at discharge.   Social Worker assessment / plan:  CSW met with patient and patient's mother at bedside to discuss recommendation for SNF. CSW discussed patient needing assistance with mobility and ADLs, and patient and mother agreed that patient would need rehab. Patient's mother said they had already told someone that they wanted him in [place] close to where they are, preferably at [facility] in [place]. CSW explained barrier of patient not having insurance, so the hospital would have to find someone who would work with them on taking the patient without insurance.   Employment status:  Unemployed Forensic scientist:  Self Pay (Medicaid Pending) PT Recommendations:  [place] / Referral to community resources:  [place]  Patient/Family's Response to care:  Patient agreeable to SNF placement.  Patient/Family's  Understanding of and Emotional Response to Diagnosis, Current Treatment, and Prognosis:  Patient was agitated during discussion, said that the chair hurt his back and he would like to be moved. Patient acknowledged that he's having difficulty with his mobility and that he needs help with things, but it's frustrating for him. Patient's mother acknowledged that the patient doesn't have insurance at this time.  Emotional Assessment Appearance:  Appears stated age Attitude/Demeanor/Rapport:  Engaged Affect (typically observed):  Frustrated Orientation:  Oriented to Self, Oriented to Place, Oriented to  Time, Oriented to Situation Alcohol / Substance use:  Not Applicable Psych involvement (Current and /or in the community):  No (Comment)  Discharge Needs  Concerns to be addressed:  Care Coordination Readmission within the last 30 days:  No Current discharge risk:  Physical Impairment, Dependent with Mobility Barriers to Discharge:  Inadequate or no insurance   [place], [place] 11/14/2017, 4:05 PM

## 2017-11-14 NOTE — Progress Notes (Signed)
Patient refused CPAP at this time.

## 2017-11-14 NOTE — NC FL2 (Signed)
[place] LEVEL OF CARE SCREENING TOOL     IDENTIFICATION  Patient Name: Daniel [name] Birthdate: 1965-08-06 Sex: male Admission Date (Current Location): 10/27/2017  [facility] and [place] Number:  Herbalist and Address:  [facility], [place] [address], [place], [place] [address]      Provider Number: [id number]  Attending Physician Name and Address:  Patrecia Pour, MD  Relative Name and Phone Number:       Current Level of Care: Hospital Recommended Level of Care: [place] Prior Approval Number:    Date Approved/Denied:   PASRR Number: [id number]  Discharge Plan: SNF    Current Diagnoses: Patient Active Problem List   Diagnosis Date Noted  . Fever   . Status epilepticus ([place])   . Recurrent seizures ([place]) 10/27/2017  . Altered sensorium 10/27/2017  . Dyspnea on exertion 08/14/2017  . Chronic respiratory failure with hypoxia ([place]) 08/14/2017  . Sleep apnea 08/14/2017  . Obesity hypoventilation syndrome ([place]) 08/14/2017  . Fatigue 08/14/2017  . Hypertension 08/14/2017  . Hyperlipidemia 12/23/2016  . Hypothyroid 12/23/2016  . Acute combined systolic and diastolic congestive heart failure ([place]) 06/24/2016  . CKD (chronic kidney disease), stage III ([place]) 06/24/2016  . Type 2 diabetes mellitus without complications ([place]) 11/94/1740    Orientation RESPIRATION BLADDER Height & Weight     Self  Normal Incontinent, External catheter Weight: (!) 147.2 kg Height:  5\' 9"  (175.3 cm)  BEHAVIORAL SYMPTOMS/MOOD NEUROLOGICAL BOWEL NUTRITION STATUS    Convulsions/Seizures, Grand mal(keppra/ vimpat/ dilantin) Incontinent Diet(dysphagia 2 with nectar thick liquids)  AMBULATORY STATUS COMMUNICATION OF NEEDS Skin   Extensive Assist Verbally Other (Comment)(MASD in groin area)                       Personal Care Assistance Level of Assistance  Bathing, Feeding, Dressing Bathing Assistance: Maximum  assistance Feeding assistance: Maximum assistance Dressing Assistance: Maximum assistance     Functional Limitations Info  Sight, Hearing, Speech Sight Info: Adequate Hearing Info: Adequate Speech Info: Adequate    SPECIAL CARE FACTORS FREQUENCY  PT (By licensed PT), OT (By licensed OT), Speech therapy     PT Frequency: 5x/wk OT Frequency: 5x/wk     Speech Therapy Frequency: 5x/wk      Contractures Contractures Info: Not present    Additional Factors Info  Code Status, Allergies, Insulin Sliding Scale Code Status Info: full Allergies Info: MONTELUKAST    Insulin Sliding Scale Info: Novolog 0-20 units every 4 hours as needed/ Lantus 10 units at bedtime       Current Medications (11/14/2017):  This is the current hospital active medication list Current Facility-Administered Medications  Medication Dose Route Frequency Provider Last Rate Last Dose  . acetaminophen (TYLENOL) tablet 650 mg  650 mg Oral Q6H PRN Patrecia Pour, MD   650 mg at 11/06/17 2014   Or  . acetaminophen (TYLENOL) suppository 650 mg  650 mg Rectal Q6H PRN Patrecia Pour, MD   650 mg at 11/07/17 1650  . albuterol (PROVENTIL) (2.5 MG/3ML) 0.083% nebulizer solution 3 mL  3 mL Inhalation Q4H PRN Patrecia Pour, MD      . aspirin chewable tablet 81 mg  81 mg Oral Daily Patrecia Pour, MD   81 mg at 11/14/17 0959  . bisacodyl (DULCOLAX) suppository 10 mg  10 mg Rectal Daily PRN Patrecia Pour, MD      . budesonide (PULMICORT) nebulizer  solution 0.25 mg  0.25 mg Nebulization BID Vance Gather B, MD   0.25 mg at 11/14/17 0753  . chlorhexidine (PERIDEX) 0.12 % solution 15 mL  15 mL Mouth Rinse BID Patrecia Pour, MD   15 mL at 11/14/17 0959  . feeding supplement (ENSURE ENLIVE) (ENSURE ENLIVE) liquid 237 mL  237 mL Oral BID BM Patrecia Pour, MD   237 mL at 11/14/17 1000  . heparin injection 5,000 Units  5,000 Units Subcutaneous Q8H Patrecia Pour, MD   5,000 Units at 11/14/17 0541  . hydrALAZINE (APRESOLINE) injection 10  mg  10 mg Intravenous Q4H PRN Patrecia Pour, MD   10 mg at 11/14/17 0529  . hydrALAZINE (APRESOLINE) tablet 100 mg  100 mg Oral Q8H Patrecia Pour, MD   100 mg at 11/13/17 2206  . insulin aspart (novoLOG) injection 0-20 Units  0-20 Units Subcutaneous Q4H Patrecia Pour, MD   3 Units at 11/14/17 [phone]  . insulin glargine (LANTUS) injection 10 Units  10 Units Subcutaneous QHS Patrecia Pour, MD   10 Units at 11/13/17 2211  . labetalol (NORMODYNE,TRANDATE) injection 10 mg  10 mg Intravenous Q2H PRN Patrecia Pour, MD   10 mg at 11/04/17 1205  . lacosamide (VIMPAT) 200 mg in sodium chloride 0.9 % 25 mL IVPB  200 mg Intravenous Q12H Vance Gather B, MD 90 mL/hr at 11/14/17 1002 200 mg at 11/14/17 1002  . levETIRAcetam (KEPPRA) IVPB 1000 mg/100 mL premix  1,000 mg Intravenous Q12H Vance Gather B, MD 400 mL/hr at 11/14/17 1115 1,000 mg at 11/14/17 1115  . levothyroxine (SYNTHROID, LEVOTHROID) tablet 50 mcg  50 mcg Oral QAC breakfast Patrecia Pour, MD   50 mcg at 11/14/17 0844  . MEDLINE mouth rinse  15 mL Mouth Rinse q12n4p Patrecia Pour, MD   15 mL at 11/13/17 1826  . metoprolol succinate (TOPROL-XL) 24 hr tablet 50 mg  50 mg Oral Daily Patrecia Pour, MD   50 mg at 11/14/17 0959  . pantoprazole (PROTONIX) EC tablet 40 mg  40 mg Oral Daily Patrecia Pour, MD   40 mg at 11/14/17 0959  . phenytoin (DILANTIN) 175 mg in sodium chloride 0.9 % 100 mL IVPB  175 mg Intravenous Q8H Patrecia Pour, MD 207 mL/hr at 11/14/17 0624 175 mg at 11/14/17 0624  . Tonica   Oral PRN Patrecia Pour, MD      . sodium chloride 0.45 % 1,000 mL with potassium chloride 40 mEq infusion   Intravenous Continuous Patrecia Pour, MD 75 mL/hr at 11/13/17 2222    . sodium chloride flush (NS) 0.9 % injection 10-40 mL  10-40 mL Intracatheter Q12H Patrecia Pour, MD   10 mL at 11/13/17 2212  . sodium chloride flush (NS) 0.9 % injection 10-40 mL  10-40 mL Intracatheter PRN Patrecia Pour, MD         Discharge Medications: Please see  discharge summary for a list of discharge medications.  Relevant Imaging Results:  Relevant Lab Results:   Additional Information SS#: [id number]  Pollie Friar, RN

## 2017-11-14 NOTE — Progress Notes (Signed)
PROGRESS NOTE  Daniel [name]  FOY:[id number] DOB: [birth year] DOA: 10/27/2017 PCP: Antonietta Jewel, MD   Brief Narrative: Daniel [name] is a 52 y.o. male with a history of seizure disorder, IDDM, OSA, OHS, morbid obesity, HTN, IDDM, and PE who presented to [facility] with confusion, headache, hallucinations, and falls found to have hypertensive urgency, started on esmolol gtt, and developed seizures in the ED. He was admitted to [facility] and intubated 9/7. Developed increasing secretions found to have Hemophilus pneumonia, treated with antibiotics, ultimately extubated 9/13. Has had an episode of diminished responsiveness for which neurology was reconsulted and reloaded dilantin due to subtherapeutic level. EEG repeated, showing generalized slowing. Nephrology was consulted for AKI and hypernatremia which are improving with IV fluids. Mental status has overall improved with bouts of delirium. Per oral intake is slowly improving with concomitant improvement in mentation.  Assessment & Plan: Principal Problem:   Recurrent seizures ([place]) Active Problems:   Sleep apnea   Obesity hypoventilation syndrome (HCC)   Hypertension   Status epilepticus ([place])   Fever  Status epilepticus, thought to be hyperglycemia-induced:  - Continue AEDs per neurology: vimpat, dilantin, keppra. Plan to transition to po on discharge. Phenytoin level low, but corrects to goal range 10-20 with hypoalbuminemia.   Hypernatremia: Resolved with IVF and po intake. - Continue daily monitoring. Encourage po. If stable in AM, can DC IVF's and monitor.  IDDM: Uncontrolled with hyperglycemia. HbA1c 15.8%. So his AVERAGE blood sugar has been >400mg /dl. Home lantus (U500) dose is 120 units, so suspect a lot of resistance due to obesity and incomplete adherence (confirmed with wife and parents) given insulin requirements here.  - Continue lantus 10u qHS and resistant SSI. CBGs at goal. Strongly suspect diet is diminished compared to baseline, may need  titration as po picks up. Remains at inpatient goal.  AKI on stage III CKD: Partially due to urinary retention, hypovolemia. Nephrology consulted, started IVFs, holding diuretic, ACE with improvement. CrCl now well above 75ml/min. SPEP without M-spike; K/L LC ratio normal at 1.36. Based on current creatinine, patient does not have chronic kidney disease. - Nephrology signed off 9/20. Monitor UOP, anticipate autodiuresis. Strict I/O. - Will need nephrology follow up for hematuria, proteinuria eventually. UPr:Cr 2.38; most likely due to diabetic nephropathy.  Hypoalbuminemia, suspect malnutrition despite morbid obesity:  - Dietitian consulted; start supplementation with magic cup TIDWC and ensure enlive po BID thickened.    Edema: Mostly due to inactivity, hypoalbuminemia (malnutrition and proteinuria contributing), peripheral IV's/SVT.  - Mostly upper extremities, not dependent, so will hold on diuretic. No evidence of pulmonary edema.  Hypokalemia: Improving with replacement and picking up po intake.  - Replace again today by mouth and recheck in AM  Poorly responsive episodes: Possibly post ictal on 9/16, reloaded with dilantin and has improved. No respiratory compromise noted. No seizure activity noted by family at bedside. Resolved.  Acute encephalopathy, acute delirium: ABG has ruled out hypercarbia. Improving significantly. - Mobilize as able. PT/OT/SLP.  Dysphagia: Hopeful for improvement with his improved level or alertness. - Continue working with SLP. Continue dysphagia diet and thickened liquids (family educated). - Discussed with SLP who plans FEES 9/25.  Poor IV access: PICC placed 9/19 RUE.  Acute on chronic hypoxic respiratory failure: Ongoing poor respiratory status due to OSA, OHS, morbid obesity, obstructive lung disease, CAP. Has been on O2 since 2018 admission for pulmonary infiltrates, possible pneumonia. - Completed antibiotics 9/17.  - CPAP qHS when able - Continue  supplemental oxygen  Fever: Afebrile since 9/17 off abx. Cortisol, TSH wnl. ID consulted, feel this is more likely drug-related or central fever.   Hypertensive emergency: Improved. Largely at goal. - Continue BP medications.   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 Increase hydralazine to 100mg  q8h today.  Hypothyroidism: TSH 1.415 - Continue synthroid.  Diarrhea: Has resolved, not a cause of hypokalemia currently. - Negative CDiff  Left cephalic vein SVT:  - Warm compresses. No DVT.  DVT prophylaxis: Heparin SQ Code Status: Full Family Communication: Mother at bedside this AM Disposition Plan: SLP to perform FEES 9/25, then medically stable to begin rehab at [facility].   Consultants:   Neurology  PCCM  Infectious disease  Nephrology  Procedures:  EEG 9/07 >> focal non convulsive status epilepticus from Rt occipital region LP 9/08 >> RBC 99, WBC 4, glucose 104, protein 81  Cultures/Antimicrobials:  CSF 9/07 >> negative  Blood 9/10 >> negative  Sputum 9/10 >>Haemophilus influenzae  C diff PCR 9/12 >>negative   Vancomycin 9/10 -9/12  Zosyn 9/10 -9/12  Rocephin 9/12 - 9/17  Subjective: More alert everyday per mother. Wants to get up and walking though required 2+ assist just for EOB movement. No new complaints.  Objective: Vitals:   11/14/17 0623 11/14/17 0753 11/14/17 0820 11/14/17 1121  BP: (!) 171/96  (!) 169/100 (!) 182/97  Pulse:   85 89  Resp:   20 20  Temp:   98.2 F (36.8 C) 99.5 F (37.5 C)  TempSrc:   Oral Oral  SpO2:  97% 95% 96%  Weight:      Height:        Intake/Output Summary (Last 24 hours) at 11/14/2017 1214 Last data filed at 11/14/2017 1000 Gross per 24 hour  Intake 3309.96 ml  Output 2475 ml  Net 834.96 ml   Filed Weights   11/12/17 0347 11/12/17 0600 11/13/17 2116  Weight: (!) 151 kg (!) 147.6 kg (!) 147.2 kg   Gen: Obese, alert male in no  distress Pulm: Nonlabored breathing supplemental oxygen. Clear. CV: Regular rate and rhythm. No murmur, rub, or gallop. No JVD, trace LE dependent edema. GI: Abdomen soft, non-tender, non-distended, with normoactive bowel sounds.  Ext: Warm, no deformities. Improving edema on hands bilaterally. Skin: No rashes, lesions or ulcers on visualized skin.  Neuro: Alert and oriented. Diffuse weakness again noted but no focal neurological deficits. Psych: Judgement and insight appear fair. Mood euthymic & affect congruent. Behavior is appropriate.    Data Reviewed: I have personally reviewed following labs and imaging studies  CBC: Recent Labs  Lab 11/08/17 0403  WBC 9.7  NEUTROABS 6.3  HGB 12.5*  HCT 44.0  MCV 97.3  PLT 951   Basic Metabolic Panel: Recent Labs  Lab 11/08/17 0403  11/10/17 0855 11/11/17 0500 11/12/17 0630 11/13/17 0746 11/14/17 0500  NA 157*   < > 150* 148* 147* 144 142  K 3.8   < > 2.9* 2.7* 3.0* 3.8 3.2*  CL 122*   < > 116* 115* 111 112* 106  CO2 28   < > 25 25 28 24 27   GLUCOSE 204*   < > 158* 149* 122* 138* 150*  BUN 45*   < > 19 14 10 7 8   CREATININE 1.96*   < > 1.35* 1.32* 1.23 1.09 1.16  CALCIUM 8.1*   < > 7.7* 7.5* 7.6* 8.0* 7.8*  MG 2.3  --   --  2.0  --  1.8  --   PHOS 3.7   < > 2.0* 2.8 2.6 3.4 3.0   < > = values in this interval not displayed.   GFR: Estimated Creatinine Clearance: 105.5 mL/min (by C-G formula based on SCr of 1.16 mg/dL). Liver Function Tests: Recent Labs  Lab 11/10/17 0855 11/11/17 0500 11/12/17 0630 11/13/17 0746 11/14/17 0500  ALBUMIN 2.0* 1.9* 1.9* 1.9* 2.0*   No results for input(s): LIPASE, AMYLASE in the last 168 hours. No results for input(s): AMMONIA in the last 168 hours. Coagulation Profile: No results for input(s): INR, PROTIME in the last 168 hours. Cardiac Enzymes: Recent Labs  Lab 11/08/17 1209  CKTOTAL 271   BNP (last 3 results) No results for input(s): PROBNP in the last 8760 hours. HbA1C: No  results for input(s): HGBA1C in the last 72 hours. CBG: Recent Labs  Lab 11/13/17 1923 11/13/17 2312 11/14/17 0502 11/14/17 0814 11/14/17 1117  GLUCAP 150* 141* 150* 118* 189*   Lipid Profile: No results for input(s): CHOL, HDL, LDLCALC, TRIG, CHOLHDL, LDLDIRECT in the last 72 hours. Thyroid Function Tests: No results for input(s): TSH, T4TOTAL, FREET4, T3FREE, THYROIDAB in the last 72 hours. Anemia Panel: No results for input(s): VITAMINB12, FOLATE, FERRITIN, TIBC, IRON, RETICCTPCT in the last 72 hours. Urine analysis:    Component Value Date/Time   COLORURINE YELLOW 11/07/2017 1809   APPEARANCEUR HAZY (A) 11/07/2017 1809   LABSPEC 1.020 11/07/2017 1809   PHURINE 5.0 11/07/2017 1809   GLUCOSEU 50 (A) 11/07/2017 1809   HGBUR MODERATE (A) 11/07/2017 1809   BILIRUBINUR NEGATIVE 11/07/2017 1809   KETONESUR NEGATIVE 11/07/2017 1809   PROTEINUR 100 (A) 11/07/2017 1809   NITRITE NEGATIVE 11/07/2017 1809   LEUKOCYTESUR NEGATIVE 11/07/2017 1809   Recent Results (from the past 240 hour(s))  Culture, blood (Routine X 2) w Reflex to ID Panel     Status: None   Collection Time: 11/06/17 10:50 AM  Result Value Ref Range Status   Specimen Description BLOOD RIGHT ANTECUBITAL  Final   Special Requests   Final    BOTTLES DRAWN AEROBIC ONLY Blood Culture results may not be optimal due to an inadequate volume of blood received in culture bottles   Culture   Final    NO GROWTH 5 DAYS Performed at [facility], [place] [address], [place], [place] [address]    Report Status 11/11/2017 FINAL  Final  Culture, blood (Routine X 2) w Reflex to ID Panel     Status: None   Collection Time: 11/06/17 11:19 AM  Result Value Ref Range Status   Specimen Description BLOOD RIGHT ANTECUBITAL  Final   Special Requests   Final    BOTTLES DRAWN AEROBIC ONLY Blood Culture adequate volume   Culture   Final    NO GROWTH 5 DAYS Performed at [facility], [place] [address] [place], [place] [address]      Report Status 11/11/2017 FINAL  Final  Urine Culture     Status: None   Collection Time: 11/06/17  6:52 PM  Result Value Ref Range Status   Specimen Description URINE, CATHETERIZED  Final   Special Requests NONE  Final   Culture   Final    NO GROWTH Performed at [facility], [place] [address], [place], [place] [address]    Report Status 11/08/2017 FINAL  Final      Radiology Studies: No results found.  Scheduled Meds: . aspirin  81 mg Oral Daily  .  budesonide (PULMICORT) nebulizer solution  0.25 mg Nebulization BID  . chlorhexidine  15 mL Mouth Rinse BID  . feeding supplement (ENSURE ENLIVE)  237 mL Oral BID BM  . heparin injection (subcutaneous)  5,000 Units Subcutaneous Q8H  . hydrALAZINE  100 mg Oral Q8H  . insulin aspart  0-20 Units Subcutaneous Q4H  . insulin glargine  10 Units Subcutaneous QHS  . levothyroxine  50 mcg Oral QAC breakfast  . mouth rinse  15 mL Mouth Rinse q12n4p  . metoprolol succinate  50 mg Oral Daily  . pantoprazole  40 mg Oral Daily  . sodium chloride flush  10-40 mL Intracatheter Q12H   Continuous Infusions: . lacosamide (VIMPAT) IV 200 mg (11/14/17 1002)  . levETIRAcetam 1,000 mg (11/14/17 1115)  . phenytoin (DILANTIN) IV 175 mg (11/14/17 0624)  . sodium chloride 0.45 % with kcl 75 mL/hr at 11/13/17 2222     LOS: 18 days   Time spent: 25 minutes.  Patrecia Pour, MD [facility] [email or web address] Password [facility] 11/14/2017, 12:14 PM

## 2017-11-14 NOTE — Progress Notes (Signed)
Patient declined CPAP at this time, states he only wear it at home sometimes but does not want one while here. NO distress noted RCP will continue to follow.

## 2017-11-15 LAB — RENAL FUNCTION PANEL
ALBUMIN: 2 g/dL — AB (ref 3.5–5.0)
Anion gap: 9 (ref 5–15)
BUN: 8 mg/dL (ref 6–20)
CO2: 28 mmol/L (ref 22–32)
CREATININE: 1.09 mg/dL (ref 0.61–1.24)
Calcium: 7.9 mg/dL — ABNORMAL LOW (ref 8.9–10.3)
Chloride: 106 mmol/L (ref 98–111)
GFR calc Af Amer: >60 mL/min (ref >60)
Glucose, Bld: 132 mg/dL — ABNORMAL HIGH (ref 70–99)
PHOSPHORUS: 3.3 mg/dL (ref 2.5–4.6)
Potassium: 3.4 mmol/L — ABNORMAL LOW (ref 3.5–5.1)
Sodium: 143 mmol/L (ref 135–145)

## 2017-11-15 LAB — GLUCOSE, CAPILLARY
GLUCOSE-CAPILLARY: 132 mg/dL — AB (ref 70–99)
GLUCOSE-CAPILLARY: 155 mg/dL — AB (ref 70–99)
Glucose-Capillary: 135 mg/dL — ABNORMAL HIGH (ref 70–99)
Glucose-Capillary: 167 mg/dL — ABNORMAL HIGH (ref 70–99)
Glucose-Capillary: 232 mg/dL — ABNORMAL HIGH (ref 70–99)

## 2017-11-15 LAB — MAGNESIUM: MAGNESIUM: 1.7 mg/dL (ref 1.7–2.4)

## 2017-11-15 MED ORDER — LEVETIRACETAM 500 MG PO TABS
1000.0000 mg | ORAL_TABLET | Freq: Two times a day (BID) | ORAL | Status: DC
  Administered 2017-11-15 – 2017-11-22 (×14): 1000 mg via ORAL
  Filled 2017-11-15 (×14): qty 2

## 2017-11-15 MED ORDER — LACOSAMIDE 200 MG PO TABS
200.0000 mg | ORAL_TABLET | Freq: Two times a day (BID) | ORAL | Status: DC
  Administered 2017-11-15 – 2017-11-22 (×14): 200 mg via ORAL
  Filled 2017-11-15 (×14): qty 1

## 2017-11-15 MED ORDER — PHENYTOIN SODIUM EXTENDED 100 MG PO CAPS
300.0000 mg | ORAL_CAPSULE | Freq: Every day | ORAL | Status: DC
  Administered 2017-11-15: 300 mg via ORAL
  Filled 2017-11-15: qty 3

## 2017-11-15 MED ORDER — PHENYTOIN SODIUM EXTENDED 100 MG PO CAPS
200.0000 mg | ORAL_CAPSULE | Freq: Every day | ORAL | Status: DC
  Administered 2017-11-16: 200 mg via ORAL
  Filled 2017-11-15: qty 2

## 2017-11-15 MED ORDER — POTASSIUM CHLORIDE 20 MEQ PO PACK
20.0000 meq | PACK | Freq: Once | ORAL | Status: AC
  Administered 2017-11-15: 20 meq via ORAL
  Filled 2017-11-15: qty 1

## 2017-11-15 MED ORDER — INSULIN ASPART 100 UNIT/ML ~~LOC~~ SOLN
0.0000 [IU] | Freq: Three times a day (TID) | SUBCUTANEOUS | Status: DC
  Administered 2017-11-16: 11 [IU] via SUBCUTANEOUS
  Administered 2017-11-16: 4 [IU] via SUBCUTANEOUS
  Administered 2017-11-17: 7 [IU] via SUBCUTANEOUS

## 2017-11-15 NOTE — Progress Notes (Signed)
Patient's spouse states that patient is going to be changed to thin liquids. No order change at this time. Spouse states she will continue to thicken thin liquids to honey consistency per last diet order until it is changed. Daniel [name]

## 2017-11-15 NOTE — Progress Notes (Signed)
PROGRESS NOTE    Daniel [name]  QZE:[id number] DOB: 1965-07-30 DOA: 10/27/2017 PCP: Daniel Jewel, MD   Brief Narrative:  Daniel [name] is Daniel [name] 52 y.o. male with Daniel [name] history of seizure disorder, IDDM, OSA, OHS, morbid obesity, HTN, IDDM, and PE who presented to [facility] with confusion, headache, hallucinations, and falls found to have hypertensive urgency, started on esmolol gtt, and developed seizures in the ED. He was admitted to [facility] and intubated 9/7. Developed increasing secretions found to have Hemophilus pneumonia, treated with antibiotics, ultimately extubated 9/13. Has had an episode of diminished responsiveness for which neurology was reconsulted and reloaded dilantin due to subtherapeutic level. EEG repeated, showing generalized slowing. Nephrology was consulted for AKI and hypernatremia which are improving with IV fluids. Mental status has overall improved with bouts of delirium. Per oral intake is slowly improving with concomitant improvement in mentation.  Assessment & Plan:   Principal Problem:   Recurrent seizures ([place]) Active Problems:   Sleep apnea   Obesity hypoventilation syndrome (HCC)   Hypertension   Status epilepticus ([place])   Fever   Status epilepticus, thought to be hyperglycemia-induced:  - Continue AEDs per neurology: vimpat, dilantin, keppra. Plan to transition to po on discharge. Phenytoin level low, but corrects to goal range 10-20 with hypoalbuminemia - stable   Hypernatremia: Resolved with IVF and po intake. - Continue daily monitoring. Encourage po.  IDDM: Uncontrolled with hyperglycemia. HbA1c 15.8%. So his AVERAGE blood sugar has been >400mg /dl. Home lantus (U500) dose is 120 units, so suspect Daniel [name] lot of resistance due to obesity and incomplete adherence (confirmed with wife and parents) given insulin requirements here.  - Continue lantus 10u qHS and resistant SSI. CBGs at goal. Strongly suspect diet is diminished compared to baseline, may need titration as po picks  up. Remains at inpatient goal.  Will need close follow up.  AKI on stage III CKD: Partially due to urinary retention, hypovolemia. Nephrology consulted, started IVFs, holding diuretic, ACE with improvement. CrCl now well above 15ml/min. SPEP without M-spike; K/L LC ratio normal at 1.36. Based on current creatinine, patient does not have chronic kidney disease. - Nephrology signed off 9/20. Monitor UOP, anticipate autodiuresis. Strict I/O. - Will need nephrology follow up for hematuria, proteinuria eventually. UPr:Cr 2.38; most likely due to diabetic nephropathy.  Hypoalbuminemia, suspect malnutrition despite morbid obesity:  - Dietitian consulted; start supplementation with magic cup TIDWC and ensure enlive po BID thickened.    Edema: Mostly due to inactivity, hypoalbuminemia (malnutrition and proteinuria contributing), peripheral IV's/SVT.  - Mostly upper extremities, not dependent, so will hold on diuretic. No evidence of pulmonary edema.  Hypokalemia: Improving with replacement and picking up po intake.  - Replace and follow  Poorly responsive episodes: Possibly post ictal on 9/16, reloaded with dilantin and has improved. No respiratory compromise noted. No seizure activity noted by family at bedside. Resolved.  Acute encephalopathy, acute delirium: ABG has ruled out hypercarbia. Improving significantly. - Mobilize as able. PT/OT/SLP.  Dysphagia: Hopeful for improvement with his improved level or alertness. - Continue working with SLP. Continue dysphagia diet and thickened liquids (family educated). - S/p FEES with SLP on 9/25 - recommended regular diet and thin liquids, no SLP follow up - consider GI follow up with concern for primary esophageal issue per speech  Poor IV access: PICC placed 9/19 RUE.  Acute on chronic hypoxic respiratory failure: Ongoing poor respiratory status due to OSA, OHS, morbid obesity, obstructive lung disease, CAP. Has been on O2 since 2018 admission  for pulmonary infiltrates, possible pneumonia. - Completed antibiotics 9/17.  - CPAP qHS when able - Continue supplemental oxygen   Fever: Afebrile since 9/17 off abx. Cortisol, TSH wnl. ID consulted, feel this is more likely drug-related or central fever.   Hypertensive emergency: Improved.  - Continue BP medications.   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 Increase hydralazine to 100mg  q8h today.  Hypothyroidism: TSH 1.415 - Continue synthroid.  Diarrhea: Has resolved, not Daniel [name] cause of hypokalemia currently. - Negative CDiff  Left cephalic vein SVT:  - Warm compresses. No DVT.    DVT prophylaxis: heparin Code Status: full Family Communication: wife at bedside Disposition Plan: pending   Consultants:   Neurology  PCCM  ID  Nephrology  Procedures:  Right: No evidence of thrombosis in the subclavian.  Left: No evidence of deep vein thrombosis in the upper extremity. Findings consistent with age indeterminate superficial vein thrombosis involving the left cephalic vein.  EEG 9/17  IMPRESSION: This EEG is characterized by slowing which is consistent with normal drowse.  Can not rule out the possibility of slowing related to general cerebral disturbance such as [name] metabolic encephalopathy.  Clinical correlation recommended.  No epileptiform activity is noted.    EEG 9/07 >> focal non convulsive status epilepticus from Rt occipital region  LP 9/8 RBC 99, WBC 4, glucose 104, protein 81   CSF 9/07 >> negative  Blood 9/10 >> negative  Sputum 9/10 >>Haemophilus influenzae  C diff PCR 9/12 >>negative  Antimicrobials:  Anti-infectives (From admission, onward)   Start     Dose/Rate Route Frequency Ordered Stop   11/02/17 0900  cefTRIAXone (ROCEPHIN) 1 g in sodium chloride 0.9 % 100 mL IVPB  Status:  Discontinued     1 g 200 mL/hr over 30 Minutes Intravenous Every 24 hours  11/02/17 0850 11/07/17 1411   11/01/17 1000  vancomycin (VANCOCIN) 1,750 mg in sodium chloride 0.9 % 500 mL IVPB  Status:  Discontinued     1,750 mg 250 mL/hr over 120 Minutes Intravenous Every 24 hours 10/31/17 0932 11/02/17 0851   10/31/17 1000  piperacillin-tazobactam (ZOSYN) IVPB 3.375 g  Status:  Discontinued     3.375 g 12.5 mL/hr over 240 Minutes Intravenous Every 8 hours 10/31/17 0928 11/02/17 0850   10/31/17 1000  vancomycin (VANCOCIN) 2,000 mg in sodium chloride 0.9 % 500 mL IVPB     2,000 mg 250 mL/hr over 120 Minutes Intravenous  Once 10/31/17 0932 10/31/17 1334     Subjective: No complaints today.   Objective: Vitals:   11/15/17 1203 11/15/17 1617 11/15/17 1917 11/15/17 2002  BP: (!) 141/85 (!) 155/73  (!) 155/88  Pulse: 75 75  86  Resp: 20 16  18   Temp: 98.2 F (36.8 C) 98.2 F (36.8 C)  97.9 F (36.6 C)  TempSrc: Oral Oral  Oral  SpO2: 98% 97% 97% 97%  Weight:      Height:        Intake/Output Summary (Last 24 hours) at 11/15/2017 2012 Last data filed at 11/15/2017 2197 Gross per 24 hour  Intake -  Output 2300 ml  Net -2300 ml   Filed Weights   11/12/17 0600 11/13/17 2116 11/15/17 0315  Weight: (!) 147.6 kg (!) 147.2 kg (!) 141.3 kg    Examination:  General exam: Appears calm and comfortable, obese Respiratory system: Clear to auscultation. Respiratory effort normal. Cardiovascular system: S1 & S2 heard, RRR. Gastrointestinal  system: Abdomen is nondistended, soft and nontender. Central nervous system: Alert and oriented. No focal neurological deficits. Extremities: Symmetric 5 x 5 power. Skin: No rashes, lesions or ulcers Psychiatry: Judgement and insight appear normal. Mood & affect appropriate.     Data Reviewed: I have personally reviewed following labs and imaging studies  CBC: No results for input(s): WBC, NEUTROABS, HGB, HCT, MCV, PLT in the last 168 hours. Basic Metabolic Panel: Recent Labs  Lab 11/11/17 0500 11/12/17 0630  11/13/17 0746 11/14/17 0500 11/15/17 0446  NA 148* 147* 144 142 143  K 2.7* 3.0* 3.8 3.2* 3.4*  CL 115* 111 112* 106 106  CO2 25 28 24 27 28   GLUCOSE 149* 122* 138* 150* 132*  BUN 14 10 7 8 8   CREATININE 1.32* 1.23 1.09 1.16 1.09  CALCIUM 7.5* 7.6* 8.0* 7.8* 7.9*  MG 2.0  --  1.8  --  1.7  PHOS 2.8 2.6 3.4 3.0 3.3   GFR: Estimated Creatinine Clearance: 109.6 mL/min (by C-G formula based on SCr of 1.09 mg/dL). Liver Function Tests: Recent Labs  Lab 11/11/17 0500 11/12/17 0630 11/13/17 0746 11/14/17 0500 11/15/17 0446  ALBUMIN 1.9* 1.9* 1.9* 2.0* 2.0*   No results for input(s): LIPASE, AMYLASE in the last 168 hours. No results for input(s): AMMONIA in the last 168 hours. Coagulation Profile: No results for input(s): INR, PROTIME in the last 168 hours. Cardiac Enzymes: No results for input(s): CKTOTAL, CKMB, CKMBINDEX, TROPONINI in the last 168 hours. BNP (last 3 results) No results for input(s): PROBNP in the last 8760 hours. HbA1C: No results for input(s): HGBA1C in the last 72 hours. CBG: Recent Labs  Lab 11/14/17 2315 11/15/17 0314 11/15/17 0755 11/15/17 1156 11/15/17 1620  GLUCAP 180* 135* 132* 167* 155*   Lipid Profile: No results for input(s): CHOL, HDL, LDLCALC, TRIG, CHOLHDL, LDLDIRECT in the last 72 hours. Thyroid Function Tests: No results for input(s): TSH, T4TOTAL, FREET4, T3FREE, THYROIDAB in the last 72 hours. Anemia Panel: No results for input(s): VITAMINB12, FOLATE, FERRITIN, TIBC, IRON, RETICCTPCT in the last 72 hours. Sepsis Labs: No results for input(s): PROCALCITON, LATICACIDVEN in the last 168 hours.  Recent Results (from the past 240 hour(s))  Culture, blood (Routine X 2) w Reflex to ID Panel     Status: None   Collection Time: 11/06/17 10:50 AM  Result Value Ref Range Status   Specimen Description BLOOD RIGHT ANTECUBITAL  Final   Special Requests   Final    BOTTLES DRAWN AEROBIC ONLY Blood Culture results may not be optimal due to an  inadequate volume of blood received in culture bottles   Culture   Final    NO GROWTH 5 DAYS Performed at [facility] [address] [place], [place] [address]    Report Status 11/11/2017 FINAL  Final  Culture, blood (Routine X 2) w Reflex to ID Panel     Status: None   Collection Time: 11/06/17 11:19 AM  Result Value Ref Range Status   Specimen Description BLOOD RIGHT ANTECUBITAL  Final   Special Requests   Final    BOTTLES DRAWN AEROBIC ONLY Blood Culture adequate volume   Culture   Final    NO GROWTH 5 DAYS Performed at [facility], [place] [address], [place], [place] [address]    Report Status 11/11/2017 FINAL  Final  Urine Culture     Status: None   Collection Time: 11/06/17  6:52 PM  Result Value Ref Range Status  Specimen Description URINE, CATHETERIZED  Final   Special Requests NONE  Final   Culture   Final    NO GROWTH Performed at [facility], [address] [place], [place] [address]    Report Status 11/08/2017 FINAL  Final         Radiology Studies: No results found.      Scheduled Meds: . aspirin  81 mg Oral Daily  . budesonide (PULMICORT) nebulizer solution  0.25 mg Nebulization BID  . chlorhexidine  15 mL Mouth Rinse BID  . feeding supplement (ENSURE ENLIVE)  237 mL Oral BID BM  . heparin injection (subcutaneous)  5,000 Units Subcutaneous Q8H  . hydrALAZINE  100 mg Oral Q8H  . insulin aspart  0-20 Units Subcutaneous Q4H  . insulin glargine  10 Units Subcutaneous QHS  . lacosamide  200 mg Oral BID  . levETIRAcetam  1,000 mg Oral BID  . levothyroxine  50 mcg Oral QAC breakfast  . mouth rinse  15 mL Mouth Rinse q12n4p  . metoprolol succinate  50 mg Oral Daily  . pantoprazole  40 mg Oral Daily  . phenytoin  300 mg Oral QHS   And  . [START ON 11/16/2017] phenytoin  200 mg Oral Daily  . sodium chloride flush  10-40 mL Intracatheter Q12H   Continuous Infusions:   LOS: 19 days    Time spent: over 30 min    Fayrene Helper, MD [facility] Pager [phone]  If 7PM-7AM, please contact night-coverage [email or web address] Password [facility] 11/15/2017, 8:12 PM

## 2017-11-15 NOTE — Progress Notes (Signed)
Physical Therapy Treatment Patient Details Name: Daniel [name] MRN: [id number] DOB: 01-24-1966 Today's Date: 11/15/2017    History of Present Illness 52 yo male presented to [facility] with altered mental status, confusion, headaches, hallucinations, and falls. SBP was over 200. In ER at [facility] he was started on esmolol gtt. Developed seizure in [place] at [place]. He continued to have seizures and transferred to [facility]. He was started on burst suppression with versed and required intubation 9/07. Developed fever and increased sputum 9/10.  Intubated 9/10-9/13.      PT Comments    Patient alert and participatory today. Pt continues to present with impaired cognition and reports he is in [place]. Pt tolerated bilat LE weight bearing using Vital Go bed at 67 degrees for ~30 mins. Pt able to work on bilat UE/LE therex and midline posturing in partial standing. Continue to progress as tolerated with anticipated d/c to SNF for further skilled PT services.     Follow Up Recommendations  SNF;Supervision/Assistance - 24 hour     Equipment Recommendations  None recommended by PT    Recommendations for Other Services       Precautions / Restrictions Precautions Precautions: Fall    Mobility  Bed Mobility Overal bed mobility: Needs Assistance Bed Mobility: Rolling Rolling: Mod assist         General bed mobility comments: assistance and cues required to roll R and L to change saturated bed pads and to move toward [facility]   Transfers                 General transfer comment: Tilt bed utilized for standing   Ambulation/Gait                 Stairs             Wheelchair Mobility    Modified Rankin (Stroke Patients Only)       Balance Overall balance assessment: Needs assistance Sitting-balance support: Feet supported;Bilateral upper extremity supported Sitting balance-Leahy Scale: Zero         Standing balance comment: pt stood (in tilt bed  position) for ~30 minutes and worked on midline posturing and weight shifting tilted at 67 degrees                            Cognition Arousal/Alertness: Awake/alert Behavior During Therapy: WFL for tasks assessed/performed Overall Cognitive Status: Impaired/Different from baseline Area of Impairment: Attention;Orientation;Following commands;Safety/judgement;Problem solving                 Orientation Level: Disoriented to;Place;Time Current Attention Level: Selective   Following Commands: Follows one step commands with increased time;Follows one step commands consistently Safety/Judgement: Decreased awareness of safety;Decreased awareness of deficits   Problem Solving: Requires verbal cues        Exercises General Exercises - Lower Extremity Quad Sets: Both;Other (comment)(tilt bed 67 degrees) Heel Slides: Both;Supine    General Comments        Pertinent Vitals/Pain Pain Assessment: Faces Faces Pain Scale: Hurts little more Pain Location: shoulders with flexion and L ankle with weight bearing Pain Descriptors / Indicators: Grimacing;Guarding Pain Intervention(s): Limited activity within patient's tolerance;Monitored during session;Repositioned    Home Living                      Prior Function            PT Goals (current goals can now be found in  the care plan section) Progress towards PT goals: Progressing toward goals    Frequency    Min 3X/week      PT Plan Current plan remains appropriate    Co-evaluation PT/OT/SLP Co-Evaluation/Treatment: Yes Reason for Co-Treatment: For patient/therapist safety PT goals addressed during session: Mobility/safety with mobility;Strengthening/ROM        AM-PAC PT "6 Clicks" Daily Activity  Outcome Measure  Difficulty turning over in bed (including adjusting bedclothes, sheets and blankets)?: A Little Difficulty moving from lying on back to sitting on the side of the bed? :  Unable Difficulty sitting down on and standing up from a chair with arms (e.g., wheelchair, bedside commode, etc,.)?: Unable Help needed moving to and from a bed to chair (including a wheelchair)?: Total Help needed walking in hospital room?: Total Help needed climbing 3-5 steps with a railing? : Total 6 Click Score: 8    End of Session Equipment Utilized During Treatment: Oxygen Activity Tolerance: Patient tolerated treatment well Patient left: in bed;with call bell/phone within reach;with family/visitor present;with SCD's reapplied Nurse Communication: Mobility status PT Visit Diagnosis: Muscle weakness (generalized) (M62.81)     Time: 1829-9371 PT Time Calculation (min) (ACUTE ONLY): 55 min  Charges:  $Therapeutic Exercise: 8-22 mins $Therapeutic Activity: 8-22 mins                     Earney Navy, PTA Acute Rehabilitation Services Pager: [phone] Office: [phone]     Darliss Cheney 11/15/2017, 3:44 PM

## 2017-11-15 NOTE — Progress Notes (Signed)
Nutrition Follow-up  DOCUMENTATION CODES:   Morbid obesity  INTERVENTION:  Continue Ensure Enlive po BID, each supplement provides 350 kcal and 20 grams of protein.  Encourage adequate PO intake.   NUTRITION DIAGNOSIS:   Inadequate oral intake related to inability to eat as evidenced by NPO status; diet advanced; improved  GOAL:   Patient will meet greater than or equal to 90% of their needs; progressing  MONITOR:   PO intake, Labs, Weight trends, I & O's, Skin, Supplement acceptance  REASON FOR ASSESSMENT:   Consult Enteral/tube feeding initiation and management  ASSESSMENT:   52 yo male with PMH of IDDM, HTN, OSA, OHS, and seizures who was admitted to [facility] on 9/6 with AMS. Had multiple seizures and was transferred to [facility]; required intubation 9/7. Extubated 9/13. Cortrak NGT removed 9/17.   Pt underwent FEES today. Diet has been advanced to a regular diet with thin liquids. Previous meal completion 50%. Pt currently has Ensure ordered and has been consuming most of them. RD to continue with current orders to aid in caloric and protein needs. Labs and medications reviewed.   Diet Order:   Diet Order            Diet regular Room service appropriate? Yes; Fluid consistency: Thin  Diet effective now              EDUCATION NEEDS:   No education needs have been identified at this time  Skin:  Skin Assessment: Reviewed RN Assessment  Last BM:  9/21  Height:   Ht Readings from Last 1 Encounters:  10/28/17 5\' 9"  (1.753 m)    Weight:   Wt Readings from Last 1 Encounters:  11/15/17 (!) 141.3 kg    Ideal Body Weight:  72.7 kg  BMI:  Body mass index is 46 kg/m.  Estimated Nutritional Needs:   Kcal:  2000-2200  Protein:  110-120 grams  Fluid:  2- 2.2 L/day    Corrin Parker, MS, RD, LDN Pager # [phone] After hours/ weekend pager # [phone]

## 2017-11-15 NOTE — Progress Notes (Signed)
Occupational Therapy Treatment Patient Details Name: Daniel [name] MRN: [id number] DOB: 1965/07/08 Today's Date: 11/15/2017    History of present illness 52 yo male presented to [facility] with altered mental status, confusion, headaches, hallucinations, and falls. SBP was over 200. In ER at [facility] he was started on esmolol gtt. Developed seizure in [place] at [place]. He continued to have seizures and transferred to [facility]. He was started on burst suppression with versed and required intubation 9/07. Developed fever and increased sputum 9/10.  Intubated 9/10-9/13.     OT comments  Patient participates well in session.  Cueing initially to open L eye, reporting "habit" to close it, but denies visual changes of blurry or double vision.  Patient disoriented to place and situation, but noted improved affect and engagement today as he was appropriately engaging.  Patient demonstrates ability to complete grooming tasks with setup assistance (standing in tilt bed) with encouragement, but noted continued B UE edema and decreased strength/ROM.  Reports hx of shoulder pain, inconsistent information on L or R, but limited range.  Educated patient on importance of ROM, fist pumps, and elevation for edema reduction. Plan for next session to attempt EOB activities as patient is more alert and following commands more consistently.  Will continue to follow.    Follow Up Recommendations  SNF;Supervision/Assistance - 24 hour    Equipment Recommendations  Other (comment)(TBD at next venue of care)    Recommendations for Other Services      Precautions / Restrictions Precautions Precautions: Fall Restrictions Weight Bearing Restrictions: No       Mobility Bed Mobility Overal bed mobility: Needs Assistance Bed Mobility: Rolling Rolling: Mod assist         General bed mobility comments: assistance and cues required to roll R and L to change saturated bed pads and to move toward [facility]    Transfers                 General transfer comment: Tilt bed utilized for standing     Balance Overall balance assessment: Needs assistance Sitting-balance support: Feet supported;Bilateral upper extremity supported Sitting balance-Leahy Scale: Zero       Standing balance-Leahy Scale: Zero Standing balance comment: stood in tilt bed, challenge dynamic reaching with B UES and midline positioning                            ADL either performed or assessed with clinical judgement   ADL Overall ADL's : Needs assistance/impaired     Grooming: Wash/dry hands;Wash/dry face;Oral care;Standing(standing in lift bed) Grooming Details (indicate cue type and reason): increased time and encouragmenet to engage in self care tasks, at time reporting "my wife will do it for me'                     Toileting- Water quality scientist and Hygiene: Total assistance;+2 for physical assistance;+2 for safety/equipment;Bed level Toileting - Clothing Manipulation Details (indicate cue type and reason): soiled brief and bed pads       General ADL Comments: session focused on dynamic reaching with B UEs in tilt bed standing      Vision   Additional Comments: pt closing L eye for beginning of session, patient reports "its habit"; able to encourage patient to open L eye during session; denys blurry or double vision   Perception     Praxis      Cognition Arousal/Alertness: Awake/alert Behavior During Therapy: [facility]  for tasks assessed/performed Overall Cognitive Status: Impaired/Different from baseline Area of Impairment: Attention;Orientation;Following commands;Safety/judgement;Problem solving;Memory;Awareness                 Orientation Level: Disoriented to;Place;Time Current Attention Level: Selective Memory: Decreased short-term memory Following Commands: Follows one step commands with increased time;Follows one step commands consistently Safety/Judgement:  Decreased awareness of safety;Decreased awareness of deficits Awareness: Emergent Problem Solving: Requires verbal cues;Slow processing General Comments: Pt more alert today, very talkative and appropriately joking at times.  Completely unaware he is in the hospital.         Exercises General Exercises - Lower Extremity Quad Sets: Both;Other (comment)(tilt bed 67 degrees) Heel Slides: Both;Supine Other Exercises Other Exercises: B UE shoudler flexion x 5 2 sets, dynamic reaching across midline with min guiding assistance    Shoulder Instructions       General Comments Encouraged patient to engage in hand and B UE ROM to reduce edema and promote increased strength    Pertinent Vitals/ Pain       Pain Assessment: Faces Faces Pain Scale: Hurts little more Pain Location: shoulders with flexion and L ankle with weight bearing Pain Descriptors / Indicators: Grimacing;Guarding Pain Intervention(s): Limited activity within patient's tolerance;Repositioned  Home Living                                          Prior Functioning/Environment              Frequency  Min 2X/week        Progress Toward Goals  OT Goals(current goals can now be found in the care plan section)  Progress towards OT goals: Progressing toward goals  Acute Rehab OT Goals Patient Stated Goal: to get better OT Goal Formulation: With patient Time For Goal Achievement: 11/20/17 Potential to Achieve Goals: [place] Discharge plan remains appropriate;Frequency remains appropriate    Co-evaluation      Reason for Co-Treatment: For patient/therapist safety PT goals addressed during session: Mobility/safety with mobility;Strengthening/ROM OT goals addressed during session: ADL's and self-care;Other (comment)(balance)      AM-PAC PT "6 Clicks" Daily Activity     Outcome Measure   Help from another person eating meals?: A Little Help from another person taking care of personal  grooming?: A Little Help from another person toileting, which includes using toliet, bedpan, or urinal?: Total Help from another person bathing (including washing, rinsing, drying)?: A Lot Help from another person to put on and taking off regular upper body clothing?: Total Help from another person to put on and taking off regular lower body clothing?: Total 6 Click Score: 11    End of Session Equipment Utilized During Treatment: Oxygen  OT Visit Diagnosis: Other abnormalities of gait and mobility (R26.89);Muscle weakness (generalized) (M62.81);Low vision, both eyes (H54.2);Other symptoms and signs involving cognitive function   Activity Tolerance Patient tolerated treatment well   Patient Left in bed;with call bell/phone within reach;with family/visitor present   Nurse Communication          Time: 9357-0177 OT Time Calculation (min): 54 min  Charges: OT General Charges $OT Visit: 1 Visit OT Treatments $Self Care/Home Management : 8-22 mins $Therapeutic Activity: 8-22 mins  Delight Stare, OT Acute Rehabilitation Services Pager [phone] Office [phone]    Delight Stare 11/15/2017, 5:23 PM

## 2017-11-15 NOTE — Procedures (Signed)
Objective Swallowing Evaluation: Type of Study: FEES-Fiberoptic Endoscopic Evaluation of Swallow   Patient Details  Name: Daniel [name] MRN: [id number] Date of Birth: 1965-05-14  Today's Date: 11/15/2017 Time: SLP Start Time (ACUTE ONLY): 0945 -SLP Stop Time (ACUTE ONLY): 1008  SLP Time Calculation (min) (ACUTE ONLY): 23 min   Past Medical History:  Past Medical History:  Diagnosis Date  . Arthritis    "elbows and wrists" (11/13/2017)  . CHF (congestive heart failure) ([place]) 08/2016  . CKD stage 3 due to type 2 diabetes mellitus ([place]) 06/2016  . Diabetes mellitus type 2 in obese ([place])   . High cholesterol   . Hypertension   . Hypothyroidism   . Morbid obesity ([place])   . On home oxygen therapy    "3L; now only at night" (11/13/2017)  . OSA on CPAP   . Pneumonia 2017   "double pneumonia"  . Pneumonia 10/2017   "while in ICU" (11/13/2017)  . Pulmonary embolism ([place])   . Seizure ([place])    "10/24/2017-10/28/2017; couldn't get them stopped til medically induced coma. after they brought him out he started having more; can't find RX to stop them" (11/13/2017)  . Stroke [facility])    "scans showed several mini strokes in the past; nothing recent" (11/13/2017)   Past Surgical History:  Past Surgical History:  Procedure Laterality Date  . CARDIAC CATHETERIZATION  12/2016   HPI: 52 yo male presented to [facility] with altered mental status, confusion, headaches, hallucinations, and falls.  SBP was over 200.  In ER at [facility] he was started on esmolol gtt.  Developed seizure in [place] at [place].  He continued to have seizures and transferred to [facility].  He was started on burst suppression with versed and required intubation 9/07.  Developed fever and increased sputum 9/10. Extubated 9/13.    No data recorded   Assessment / Plan / Recommendation  CHL IP CLINICAL IMPRESSIONS 11/15/2017  Clinical Impression Pt demonstrates resolution of dysphagia. There is no penetration or aspiration with any  texture, even with rapid straw sips. Pt did have intermittent cough response during study; he was noted to have trace sensed backflow above UES and was also observed to have excresence on posterior subglottic wall. Either of these findings may be eliciting occasional coughing. Pt may also have a primary esophageal issue as he comments that he has wondered if he needs his esophagus stretched. Recommend pt resume a regular diet and thin liquids with no SLP f/u needed for dysphagia.   SLP Visit Diagnosis Dysphagia, oropharyngeal phase (R13.12)  Attention and concentration deficit following --  Frontal lobe and executive function deficit following --  Impact on safety and function Mild aspiration risk      CHL IP TREATMENT RECOMMENDATION 11/15/2017  Treatment Recommendations No treatment recommended at this time     Prognosis 11/06/2017  Prognosis for Safe Diet Advancement Fair  Barriers to Reach Goals --  Barriers/Prognosis Comment --    CHL IP DIET RECOMMENDATION 11/15/2017  SLP Diet Recommendations Regular solids;Thin liquid  Liquid Administration via Cup;Straw  Medication Administration Whole meds with liquid  Compensations --  Postural Changes --      CHL IP OTHER RECOMMENDATIONS 11/06/2017  Recommended Consults --  Oral Care Recommendations Oral care BID  Other Recommendations --      CHL IP FOLLOW UP RECOMMENDATIONS 11/14/2017  Follow up Recommendations Skilled Nursing facility      [facility] IP FREQUENCY AND DURATION 11/06/2017  Speech Therapy Frequency (ACUTE ONLY) min  2x/week  Treatment Duration 2 weeks           CHL IP ORAL PHASE 11/15/2017  Oral Phase WFL  Oral - Pudding Teaspoon --  Oral - Pudding Cup --  Oral - Honey Teaspoon --  Oral - Honey Cup --  Oral - Nectar Teaspoon --  Oral - Nectar Cup --  Oral - Nectar Straw --  Oral - Thin Teaspoon --  Oral - Thin Cup --  Oral - Thin Straw --  Oral - Puree --  Oral - Mech Soft --  Oral - Regular --  Oral -  Multi-Consistency --  Oral - Pill --  Oral Phase - Comment --    CHL IP PHARYNGEAL PHASE 11/15/2017  Pharyngeal Phase WFL  Pharyngeal- Pudding Teaspoon --  Pharyngeal --  Pharyngeal- Pudding Cup --  Pharyngeal --  Pharyngeal- Honey Teaspoon --  Pharyngeal --  Pharyngeal- Honey Cup --  Pharyngeal --  Pharyngeal- Nectar Teaspoon --  Pharyngeal --  Pharyngeal- Nectar Cup --  Pharyngeal --  Pharyngeal- Nectar Straw --  Pharyngeal --  Pharyngeal- Thin Teaspoon --  Pharyngeal --  Pharyngeal- Thin Cup --  Pharyngeal --  Pharyngeal- Thin Straw --  Pharyngeal --  Pharyngeal- Puree --  Pharyngeal --  Pharyngeal- Mechanical Soft --  Pharyngeal --  Pharyngeal- Regular --  Pharyngeal --  Pharyngeal- Multi-consistency --  Pharyngeal --  Pharyngeal- Pill --  Pharyngeal --  Pharyngeal Comment --     No flowsheet data found.   Daniel [name], Daniel [name] 11/15/2017, 12:37 PM

## 2017-11-16 LAB — GLUCOSE, CAPILLARY
GLUCOSE-CAPILLARY: 255 mg/dL — AB (ref 70–99)
GLUCOSE-CAPILLARY: 283 mg/dL — AB (ref 70–99)
Glucose-Capillary: 168 mg/dL — ABNORMAL HIGH (ref 70–99)
Glucose-Capillary: 255 mg/dL — ABNORMAL HIGH (ref 70–99)

## 2017-11-16 LAB — CBC
HCT: 36.6 % — ABNORMAL LOW (ref 39.0–52.0)
HEMOGLOBIN: 11.3 g/dL — AB (ref 13.0–17.0)
MCH: 27.8 pg (ref 26.0–34.0)
MCHC: 30.9 g/dL (ref 30.0–36.0)
MCV: 89.9 fL (ref 78.0–100.0)
Platelets: 321 10*3/uL (ref 150–400)
RBC: 4.07 MIL/uL — ABNORMAL LOW (ref 4.22–5.81)
RDW: 14.8 % (ref 11.5–15.5)
WBC: 6.2 10*3/uL (ref 4.0–10.5)

## 2017-11-16 LAB — RENAL FUNCTION PANEL
ALBUMIN: 2 g/dL — AB (ref 3.5–5.0)
Anion gap: 7 (ref 5–15)
BUN: 8 mg/dL (ref 6–20)
CALCIUM: 8 mg/dL — AB (ref 8.9–10.3)
CO2: 28 mmol/L (ref 22–32)
CREATININE: 1.1 mg/dL (ref 0.61–1.24)
Chloride: 106 mmol/L (ref 98–111)
GFR calc Af Amer: >60 mL/min (ref >60)
GLUCOSE: 196 mg/dL — AB (ref 70–99)
PHOSPHORUS: 3.8 mg/dL (ref 2.5–4.6)
POTASSIUM: 3.3 mmol/L — AB (ref 3.5–5.1)
SODIUM: 141 mmol/L (ref 135–145)

## 2017-11-16 LAB — MAGNESIUM: MAGNESIUM: 1.6 mg/dL — AB (ref 1.7–2.4)

## 2017-11-16 LAB — PHENYTOIN LEVEL, TOTAL

## 2017-11-16 MED ORDER — PHENYTOIN SODIUM EXTENDED 100 MG PO CAPS
300.0000 mg | ORAL_CAPSULE | Freq: Two times a day (BID) | ORAL | Status: DC
  Administered 2017-11-16 – 2017-11-21 (×9): 300 mg via ORAL
  Filled 2017-11-16 (×9): qty 3

## 2017-11-16 MED ORDER — POTASSIUM CHLORIDE CRYS ER 20 MEQ PO TBCR
40.0000 meq | EXTENDED_RELEASE_TABLET | Freq: Once | ORAL | Status: AC
  Administered 2017-11-16: 40 meq via ORAL
  Filled 2017-11-16: qty 2

## 2017-11-16 MED ORDER — MAGNESIUM SULFATE 2 GM/50ML IV SOLN
2.0000 g | Freq: Once | INTRAVENOUS | Status: AC
  Administered 2017-11-16: 2 g via INTRAVENOUS
  Filled 2017-11-16: qty 50
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
## 2017-11-16 NOTE — Progress Notes (Signed)
MEDICATION RELATED CONSULT NOTE - Follow-up  Pharmacy Consult for Phenytoin Indication: Status epilepticus / seizures  Allergies  Allergen Reactions  . Montelukast Other (See Comments)    Possibly cause headaches per spouse    Patient Measurements: Height: 5\' 9"  (175.3 cm) Weight: (!) 324 lb (147 kg) IBW/kg (Calculated) : 70.7  Vital Signs: Temp: 98.6 F (37 C) (09/26 0857) Temp Source: Oral (09/26 0857) BP: 138/83 (09/26 0857) Pulse Rate: 92 (09/26 0857)  Labs: Recent Labs    11/14/17 0500 11/15/17 0446 11/16/17 0530  WBC  --   --  6.2  HGB  --   --  11.3*  HCT  --   --  36.6*  PLT  --   --  321  CREATININE 1.16 1.09 1.10  MG  --  1.7 1.6*  PHOS 3.0 3.3 3.8  ALBUMIN 2.0* 2.0* 2.0*   Estimated Creatinine Clearance: 111.2 mL/min (by C-G formula based on SCr of 1.1 mg/dL).  Assessment: 52 yo M presented with status epilepticus. Started on several anti-seizure medications.  Seizure medications switched to po 9/25 to prepare for discharge Phenytoin level low with transition No new seizures  Goal of Therapy:  Total Phenytoin level 10-20 mcg/ml Prevention of seizures  Plan:  Increase po phenytoin to 300 mg po BID Next level in 5 to 7 days Continue po Vimpat and Keppra  Thank you Anette Guarneri, PharmD [phone] 11/16/2017,11:52 AM

## 2017-11-17 LAB — GLUCOSE, CAPILLARY
GLUCOSE-CAPILLARY: 230 mg/dL — AB (ref 70–99)
GLUCOSE-CAPILLARY: 227 mg/dL — AB (ref 70–99)
GLUCOSE-CAPILLARY: 173 mg/dL — AB (ref 70–99)
Glucose-Capillary: 239 mg/dL — ABNORMAL HIGH (ref 70–99)

## 2017-11-17 LAB — CBC
HCT: 38.2 % — ABNORMAL LOW (ref 39.0–52.0)
Hemoglobin: 12 g/dL — ABNORMAL LOW (ref 13.0–17.0)
MCH: 28.3 pg (ref 26.0–34.0)
MCHC: 31.4 g/dL (ref 30.0–36.0)
MCV: 90.1 fL (ref 78.0–100.0)
PLATELETS: 362 10*3/uL (ref 150–400)
RBC: 4.24 MIL/uL (ref 4.22–5.81)
RDW: 15.1 % (ref 11.5–15.5)
WBC: 5.9 10*3/uL (ref 4.0–10.5)

## 2017-11-17 LAB — RENAL FUNCTION PANEL
Albumin: 2.3 g/dL — ABNORMAL LOW (ref 3.5–5.0)
Anion gap: 8 (ref 5–15)
BUN: 9 mg/dL (ref 6–20)
CHLORIDE: 103 mmol/L (ref 98–111)
CO2: 29 mmol/L (ref 22–32)
CREATININE: 1.22 mg/dL (ref 0.61–1.24)
Calcium: 8.2 mg/dL — ABNORMAL LOW (ref 8.9–10.3)
GFR calc Af Amer: >60 mL/min (ref >60)
GLUCOSE: 252 mg/dL — AB (ref 70–99)
Phosphorus: 3.1 mg/dL (ref 2.5–4.6)
Potassium: 3.9 mmol/L (ref 3.5–5.1)
Sodium: 140 mmol/L (ref 135–145)

## 2017-11-17 LAB — MAGNESIUM: MAGNESIUM: 2.1 mg/dL (ref 1.7–2.4)

## 2017-11-17 MED ORDER — INSULIN GLARGINE 100 UNIT/ML ~~LOC~~ SOLN
15.0000 [IU] | Freq: Every day | SUBCUTANEOUS | Status: DC
  Administered 2017-11-17 – 2017-11-19 (×3): 15 [IU] via SUBCUTANEOUS
  Filled 2017-11-17 (×4): qty 0.15

## 2017-11-17 MED ORDER — INSULIN ASPART 100 UNIT/ML ~~LOC~~ SOLN
0.0000 [IU] | Freq: Three times a day (TID) | SUBCUTANEOUS | Status: DC
  Administered 2017-11-17: 7 [IU] via SUBCUTANEOUS
  Administered 2017-11-17: 4 [IU] via SUBCUTANEOUS
  Administered 2017-11-18: 11 [IU] via SUBCUTANEOUS
  Administered 2017-11-18 (×2): 4 [IU] via SUBCUTANEOUS
  Administered 2017-11-19 (×2): 7 [IU] via SUBCUTANEOUS
  Administered 2017-11-19: 4 [IU] via SUBCUTANEOUS
  Administered 2017-11-20: 7 [IU] via SUBCUTANEOUS
  Administered 2017-11-20: 11 [IU] via SUBCUTANEOUS
  Administered 2017-11-20: 7 [IU] via SUBCUTANEOUS
  Administered 2017-11-21: 4 [IU] via SUBCUTANEOUS
  Administered 2017-11-21: 15 [IU] via SUBCUTANEOUS
  Administered 2017-11-21: 4 [IU] via SUBCUTANEOUS
  Administered 2017-11-22: 11 [IU] via SUBCUTANEOUS
  Administered 2017-11-22: 4 [IU] via SUBCUTANEOUS

## 2017-11-17 MED ORDER — INSULIN ASPART 100 UNIT/ML ~~LOC~~ SOLN
0.0000 [IU] | Freq: Every day | SUBCUTANEOUS | Status: DC
  Administered 2017-11-17: 2 [IU] via SUBCUTANEOUS
  Administered 2017-11-19: 3 [IU] via SUBCUTANEOUS
  Administered 2017-11-21: 2 [IU] via SUBCUTANEOUS

## 2017-11-17 MED ORDER — DIPHENHYDRAMINE HCL 25 MG PO CAPS
25.0000 mg | ORAL_CAPSULE | Freq: Four times a day (QID) | ORAL | Status: DC | PRN
  Administered 2017-11-22: 25 mg via ORAL
  Filled 2017-11-17: qty 1

## 2017-11-17 NOTE — Progress Notes (Signed)
Physical Therapy Treatment Patient Details Name: Daniel [name] MRN: [id number] DOB: [birth year] Today's Date: 11/17/2017    History of Present Illness 52 yo male presented to [facility] with altered mental status, confusion, headaches, hallucinations, and falls. SBP was over 200. In ER at [facility] he was started on esmolol gtt. Developed seizure in [place] at [facility]. He continued to have seizures and transferred to [facility]. He was started on burst suppression with versed and required intubation 9/07. Developed fever and increased sputum 9/10.  Intubated 9/10-9/13.      PT Comments    Patient is making progress toward PT goals and tolerated session well. This session focused on sitting balance EOB and sit to stand transfers. Pt able to sit grossly with min guard/min A EOB and stand with mod/max A +2 X3 from EOB. Given pt's progress with mobility and activity tolerance over the course of this week recommending CIR for further skilled PT services to maximize independence and safety with mobility.     Follow Up Recommendations  CIR     Equipment Recommendations  Other (comment)(TBD next venue)    Recommendations for Other Services Rehab consult     Precautions / Restrictions Precautions Precautions: Fall    Mobility  Bed Mobility Overal bed mobility: Needs Assistance Bed Mobility: Supine to Sit;Sit to Supine     Supine to sit: Mod assist;+2 for physical assistance Sit to supine: Max assist;+2 for physical assistance   General bed mobility comments: multimodal cues for sequencing and assistance to elevat trunk into sitting and bring bilat LE to EOB  Transfers Overall transfer level: Needs assistance Equipment used: 2 person hand held assist(gait belt) Transfers: Sit to/from Stand Sit to Stand: Mod assist;+2 physical assistance;From elevated surface;Max assist         General transfer comment: mod A +2 to power up into standing and max A +2 to maintain standing for brief  period of time X3; bilat LE blocked for safety; multimodal cues for hip extension and upright posture  Ambulation/Gait             General Gait Details: unable   Stairs             Wheelchair Mobility    Modified Rankin (Stroke Patients Only)       Balance Overall balance assessment: Needs assistance Sitting-balance support: Feet supported;Bilateral upper extremity supported Sitting balance-Leahy Scale: Poor Sitting balance - Comments: pt able to sit EOB with grossly min guard/min A    Standing balance support: Bilateral upper extremity supported Standing balance-Leahy Scale: Poor                              Cognition Arousal/Alertness: Awake/alert Behavior During Therapy: WFL for tasks assessed/performed Overall Cognitive Status: Impaired/Different from baseline Area of Impairment: Attention;Following commands;Safety/judgement;Problem solving;Memory;Awareness                   Current Attention Level: Selective Memory: Decreased short-term memory Following Commands: Follows one step commands with increased time;Follows one step commands consistently Safety/Judgement: Decreased awareness of safety;Decreased awareness of deficits Awareness: Emergent Problem Solving: Requires verbal cues;Slow processing        Exercises      General Comments        Pertinent Vitals/Pain Pain Assessment: Faces Faces Pain Scale: Hurts a little bit Pain Location: shoulders with flexion  Pain Descriptors / Indicators: Guarding Pain Intervention(s): Repositioned;Monitored during session    Home Living  Prior Function            PT Goals (current goals can now be found in the care plan section) Acute Rehab PT Goals Patient Stated Goal: to get better Progress towards PT goals: Progressing toward goals    Frequency    Min 3X/week      PT Plan Discharge plan needs to be updated    Co-evaluation               AM-PAC PT "6 Clicks" Daily Activity  Outcome Measure  Difficulty turning over in bed (including adjusting bedclothes, sheets and blankets)?: Unable Difficulty moving from lying on back to sitting on the side of the bed? : Unable Difficulty sitting down on and standing up from a chair with arms (e.g., wheelchair, bedside commode, etc,.)?: Unable Help needed moving to and from a bed to chair (including a wheelchair)?: A Lot Help needed walking in hospital room?: Total Help needed climbing 3-5 steps with a railing? : Total 6 Click Score: 7    End of Session Equipment Utilized During Treatment: Gait belt Activity Tolerance: Patient tolerated treatment well Patient left: in bed;with call bell/phone within reach;with family/visitor present Nurse Communication: Mobility status PT Visit Diagnosis: Muscle weakness (generalized) (M62.81)     Time: 1350-1416 PT Time Calculation (min) (ACUTE ONLY): 26 min  Charges:  $Therapeutic Activity: 23-37 mins                     Earney Navy, PTA Acute Rehabilitation Services Pager: [phone] Office: [phone]     Darliss Cheney 11/17/2017, 3:44 PM

## 2017-11-17 NOTE — Progress Notes (Signed)
PROGRESS NOTE    Daniel [name]  ZOX:[id number] DOB: [birth year] DOA: 10/27/2017 PCP: Antonietta Jewel, MD   Brief Narrative:  Daniel [name] is a 52 y.o. male with a history of seizure disorder, IDDM, OSA, OHS, morbid obesity, HTN, IDDM, and PE who presented to [facility] with confusion, headache, hallucinations, and falls found to have hypertensive urgency, started on esmolol gtt, and developed seizures in the ED. He was admitted to [facility] and intubated 9/7. Developed increasing secretions found to have Hemophilus pneumonia, treated with antibiotics, ultimately extubated 9/13. Has had an episode of diminished responsiveness for which neurology was reconsulted and reloaded dilantin due to subtherapeutic level. EEG repeated, showing generalized slowing. Nephrology was consulted for AKI and hypernatremia which are improving with IV fluids. Mental status has overall improved with bouts of delirium. Per oral intake is slowly improving with concomitant improvement in mentation.  Assessment & Plan:   Principal Problem:   Recurrent seizures ([place]) Active Problems:   Sleep apnea   Obesity hypoventilation syndrome (HCC)   Hypertension   Status epilepticus ([place])   Fever   Status epilepticus, thought to be hyperglycemia-induced:  - Continue AEDs per neurology: vimpat, dilantin, keppra. Plan to transition to po on discharge. Phenytoin level again low on 9/26, dose adjusted by pharmacy. - stable   Hypernatremia: Resolved with IVF and po intake. - Continue daily monitoring. Encourage po.  IDDM: Uncontrolled with hyperglycemia. HbA1c 15.8%. So his AVERAGE blood sugar has been >400mg /dl. Home lantus (U500) dose is 120 units, so suspect a lot of resistance due to obesity and incomplete adherence (confirmed with wife and parents) given insulin requirements here.  - Increase lantus to 15u qHS and resistant SSI. Fasting BG elevated this AM. Strongly suspect diet is diminished compared to baseline, may need titration as po  picks up.  Will need close follow up.  AKI on stage III CKD: Partially due to urinary retention, hypovolemia. Nephrology consulted, started IVFs, holding diuretic, ACE with improvement. CrCl now well above 31ml/min. SPEP without M-spike; K/L LC ratio normal at 1.36. Based on current creatinine, patient does not have chronic kidney disease. - Nephrology signed off 9/20. Monitor UOP, anticipate autodiuresis. Strict I/O. - Will need nephrology follow up for hematuria, proteinuria eventually. UPr:Cr 2.38; most likely due to diabetic nephropathy.  Hypoalbuminemia, suspect malnutrition despite morbid obesity:  - Dietitian consulted; start supplementation with magic cup TIDWC and ensure enlive po BID thickened.    Edema: Mostly due to inactivity, hypoalbuminemia (malnutrition and proteinuria contributing), peripheral IV's/SVT.  - Mostly upper extremities, not dependent, so will hold on diuretic. No evidence of pulmonary edema.  Hypokalemia  Hypomagnesemia: replace and follow  Poorly responsive episodes: Possibly post ictal on 9/16, reloaded with dilantin and has improved. No respiratory compromise noted. No seizure activity noted by family at bedside. Resolved.  Acute encephalopathy, acute delirium: ABG has ruled out hypercarbia. Improving significantly. - Mobilize as able. PT/OT/SLP.  Dysphagia: Hopeful for improvement with his improved level or alertness. - Continue working with SLP. Continue dysphagia diet and thickened liquids (family educated). - S/p FEES with SLP on 9/25 - recommended regular diet and thin liquids, no SLP follow up - consider GI follow up with concern for primary esophageal issue per speech  Poor IV access: PICC placed 9/19 RUE.  Acute on chronic hypoxic respiratory failure: Ongoing poor respiratory status due to OSA, OHS, morbid obesity, obstructive lung disease, CAP. Has been on O2 since 2018 admission for pulmonary infiltrates, possible pneumonia. - Completed  antibiotics 9/17.  -  CPAP qHS when able - Continue supplemental oxygen   Fever: Afebrile since 9/17 off abx. Cortisol, TSH wnl. ID consulted, feel this is more likely drug-related or central fever.   Hypertensive emergency: Improved.  - Continue BP medications.   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 Increase hydralazine to 100mg  q8h today.  Hypothyroidism: TSH 1.415 - Continue synthroid.  Diarrhea: Has resolved, not a cause of hypokalemia currently. - Negative CDiff  Left cephalic vein SVT:  - Warm compresses. No DVT.  Pruritus: no rash, unclear etiology, will order benadryl, ctm  Pt made sexually inappropriate comments to nursing on 9/26.  Discussed with pt this was not appropriate and that it is important he treat staff with respect.    DVT prophylaxis: heparin Code Status: full Family Communication: wife at bedside Disposition Plan: pending   Consultants:   Neurology  PCCM  ID  Nephrology  Procedures:  Right: No evidence of thrombosis in the subclavian.  Left: No evidence of deep vein thrombosis in the upper extremity. Findings consistent with age indeterminate superficial vein thrombosis involving the left cephalic vein.  EEG 9/17  IMPRESSION: This EEG is characterized by slowing which is consistent with normal drowse.  Can not rule out the possibility of slowing related to general cerebral disturbance such as a metabolic encephalopathy.  Clinical correlation recommended.  No epileptiform activity is noted.    EEG 9/07 >> focal non convulsive status epilepticus from Rt occipital region  LP 9/8 RBC 99, WBC 4, glucose 104, protein 81   CSF 9/07 >> negative  Blood 9/10 >> negative  Sputum 9/10 >>Haemophilus influenzae  C diff PCR 9/12 >>negative  Antimicrobials:  Anti-infectives (From admission, onward)   Start     Dose/Rate Route Frequency Ordered Stop    11/02/17 0900  cefTRIAXone (ROCEPHIN) 1 g in sodium chloride 0.9 % 100 mL IVPB  Status:  Discontinued     1 g 200 mL/hr over 30 Minutes Intravenous Every 24 hours 11/02/17 0850 11/07/17 1411   11/01/17 1000  vancomycin (VANCOCIN) 1,750 mg in sodium chloride 0.9 % 500 mL IVPB  Status:  Discontinued     1,750 mg 250 mL/hr over 120 Minutes Intravenous Every 24 hours 10/31/17 0932 11/02/17 0851   10/31/17 1000  piperacillin-tazobactam (ZOSYN) IVPB 3.375 g  Status:  Discontinued     3.375 g 12.5 mL/hr over 240 Minutes Intravenous Every 8 hours 10/31/17 0928 11/02/17 0850   10/31/17 1000  vancomycin (VANCOCIN) 2,000 mg in sodium chloride 0.9 % 500 mL IVPB     2,000 mg 250 mL/hr over 120 Minutes Intravenous  Once 10/31/17 0932 10/31/17 1334     Subjective: No complaints today C/o some itching, no rash.   Objective: Vitals:   11/17/17 0737 11/17/17 0749 11/17/17 1121 11/17/17 1631  BP: 109/73  130/83 (!) 149/84  Pulse: 93 91 69 80  Resp: (!) 22 (!) 22 20 20   Temp: 98.5 F (36.9 C)  98.4 F (36.9 C) 97.8 F (36.6 C)  TempSrc:   Oral Oral  SpO2:  96% 94% 96%  Weight:      Height:        Intake/Output Summary (Last 24 hours) at 11/17/2017 1805 Last data filed at 11/17/2017 0830 Gross per 24 hour  Intake 927.99 ml  Output -  Net 927.99 ml   Filed Weights   11/13/17 2116 11/15/17 0315 11/16/17 0500  Weight: (!) 147.2 kg (!) 141.3  kg (!) 147 kg    Examination:  General: No acute distress. Cardiovascular: Heart sounds show a regular rate, and rhythm Lungs: Clear to auscultation bilaterally Abdomen: Soft, nontender, nondistended, protuberant Neurological: Alert and oriented 3. Moves all extremities 4. Cranial nerves II through XII grossly intact. Skin: Warm and dry. No rashes or lesions. Extremities: No clubbing or cyanosis. Bilateral LEE Psychiatric: Mood and affect are normal. Insight and judgment are appropriate.    Data Reviewed: I have personally reviewed following  labs and imaging studies  CBC: Recent Labs  Lab 11/16/17 0530 11/17/17 0424  WBC 6.2 5.9  HGB 11.3* 12.0*  HCT 36.6* 38.2*  MCV 89.9 90.1  PLT 321 865   Basic Metabolic Panel: Recent Labs  Lab 11/11/17 0500  11/13/17 0746 11/14/17 0500 11/15/17 0446 11/16/17 0530 11/17/17 0424  NA 148*   < > 144 142 143 141 140  K 2.7*   < > 3.8 3.2* 3.4* 3.3* 3.9  CL 115*   < > 112* 106 106 106 103  CO2 25   < > 24 27 28 28 29   GLUCOSE 149*   < > 138* 150* 132* 196* 252*  BUN 14   < > 7 8 8 8 9   CREATININE 1.32*   < > 1.09 1.16 1.09 1.10 1.22  CALCIUM 7.5*   < > 8.0* 7.8* 7.9* 8.0* 8.2*  MG 2.0  --  1.8  --  1.7 1.6* 2.1  PHOS 2.8   < > 3.4 3.0 3.3 3.8 3.1   < > = values in this interval not displayed.   GFR: Estimated Creatinine Clearance: 100.2 mL/min (by C-G formula based on SCr of 1.22 mg/dL). Liver Function Tests: Recent Labs  Lab 11/13/17 0746 11/14/17 0500 11/15/17 0446 11/16/17 0530 11/17/17 0424  ALBUMIN 1.9* 2.0* 2.0* 2.0* 2.3*   No results for input(s): LIPASE, AMYLASE in the last 168 hours. No results for input(s): AMMONIA in the last 168 hours. Coagulation Profile: No results for input(s): INR, PROTIME in the last 168 hours. Cardiac Enzymes: No results for input(s): CKTOTAL, CKMB, CKMBINDEX, TROPONINI in the last 168 hours. BNP (last 3 results) No results for input(s): PROBNP in the last 8760 hours. HbA1C: No results for input(s): HGBA1C in the last 72 hours. CBG: Recent Labs  Lab 11/16/17 1656 11/16/17 2202 11/17/17 0621 11/17/17 1118 11/17/17 1627  GLUCAP 255* 283* 230* 227* 173*   Lipid Profile: No results for input(s): CHOL, HDL, LDLCALC, TRIG, CHOLHDL, LDLDIRECT in the last 72 hours. Thyroid Function Tests: No results for input(s): TSH, T4TOTAL, FREET4, T3FREE, THYROIDAB in the last 72 hours. Anemia Panel: No results for input(s): VITAMINB12, FOLATE, FERRITIN, TIBC, IRON, RETICCTPCT in the last 72 hours. Sepsis Labs: No results for input(s):  PROCALCITON, LATICACIDVEN in the last 168 hours.  No results found for this or any previous visit (from the past 240 hour(s)).       Radiology Studies: No results found.      Scheduled Meds: . aspirin  81 mg Oral Daily  . budesonide (PULMICORT) nebulizer solution  0.25 mg Nebulization BID  . chlorhexidine  15 mL Mouth Rinse BID  . feeding supplement (ENSURE ENLIVE)  237 mL Oral BID BM  . heparin injection (subcutaneous)  5,000 Units Subcutaneous Q8H  . hydrALAZINE  100 mg Oral Q8H  . insulin aspart  0-20 Units Subcutaneous TID WC  . insulin aspart  0-5 Units Subcutaneous QHS  . insulin glargine  15 Units Subcutaneous QHS  . lacosamide  200 mg Oral BID  . levETIRAcetam  1,000 mg Oral BID  . levothyroxine  50 mcg Oral QAC breakfast  . mouth rinse  15 mL Mouth Rinse q12n4p  . metoprolol succinate  50 mg Oral Daily  . pantoprazole  40 mg Oral Daily  . phenytoin  300 mg Oral BID  . sodium chloride flush  10-40 mL Intracatheter Q12H   Continuous Infusions:   LOS: 21 days    Time spent: over 30 min    Fayrene Helper, MD [facility] Pager [phone]  If 7PM-7AM, please contact night-coverage [email or web address] Password [facility] 11/17/2017, 6:05 PM

## 2017-11-17 NOTE — Care Management Note (Signed)
Case Management Note  Patient Details  Name: [name] MRN: [id number] Date of Birth: [birth year]  Subjective/Objective:                    Action/Plan: Pt on the DTP list for SNF. CM following.   Expected Discharge Date:                  Expected Discharge Plan:  Skilled Nursing Facility  In-House Referral:  Clinical Social Work  Discharge planning Services     Post Acute Care Choice:    Choice offered to:     DME Arranged:    DME Agency:     HH Arranged:    [place] Agency:     Status of Service:  In process, will continue to follow  If discussed at Long Length of Stay Meetings, dates discussed:    Additional Comments:  Pollie Friar, RN 11/17/2017, 3:58 PM

## 2017-11-18 LAB — CBC
HCT: 37 % — ABNORMAL LOW (ref 39.0–52.0)
Hemoglobin: 11.6 g/dL — ABNORMAL LOW (ref 13.0–17.0)
MCH: 27.8 pg (ref 26.0–34.0)
MCHC: 31.4 g/dL (ref 30.0–36.0)
MCV: 88.5 fL (ref 78.0–100.0)
PLATELETS: 359 10*3/uL (ref 150–400)
RBC: 4.18 MIL/uL — AB (ref 4.22–5.81)
RDW: 15 % (ref 11.5–15.5)
WBC: 5.9 10*3/uL (ref 4.0–10.5)

## 2017-11-18 LAB — GLUCOSE, CAPILLARY
GLUCOSE-CAPILLARY: 154 mg/dL — AB (ref 70–99)
GLUCOSE-CAPILLARY: 179 mg/dL — AB (ref 70–99)
Glucose-Capillary: 175 mg/dL — ABNORMAL HIGH (ref 70–99)
Glucose-Capillary: 261 mg/dL — ABNORMAL HIGH (ref 70–99)

## 2017-11-18 LAB — RENAL FUNCTION PANEL
Albumin: 2.2 g/dL — ABNORMAL LOW (ref 3.5–5.0)
Anion gap: 9 (ref 5–15)
BUN: 8 mg/dL (ref 6–20)
CO2: 30 mmol/L (ref 22–32)
CREATININE: 1.07 mg/dL (ref 0.61–1.24)
Calcium: 8.4 mg/dL — ABNORMAL LOW (ref 8.9–10.3)
Chloride: 100 mmol/L (ref 98–111)
GFR calc non Af Amer: >60 mL/min (ref >60)
Glucose, Bld: 213 mg/dL — ABNORMAL HIGH (ref 70–99)
Phosphorus: 3.3 mg/dL (ref 2.5–4.6)
Potassium: 3.6 mmol/L (ref 3.5–5.1)
SODIUM: 139 mmol/L (ref 135–145)

## 2017-11-18 LAB — MAGNESIUM: MAGNESIUM: 1.8 mg/dL (ref 1.7–2.4)

## 2017-11-18 MED ORDER — DICLOFENAC SODIUM 1 % TD GEL
4.0000 g | Freq: Four times a day (QID) | TRANSDERMAL | Status: DC | PRN
  Administered 2017-11-18 – 2017-11-19 (×3): 4 g via TOPICAL
  Filled 2017-11-18: qty 100

## 2017-11-18 NOTE — Progress Notes (Signed)
PROGRESS NOTE    Daniel [name]  EXN:[id number] DOB: 1965/05/14 DOA: 10/27/2017 PCP: Antonietta Jewel, MD   Brief Narrative:  Daniel [name] is a 52 y.o. male with a history of seizure disorder, IDDM, OSA, OHS, morbid obesity, HTN, IDDM, and PE who presented to [facility] with confusion, headache, hallucinations, and falls found to have hypertensive urgency, started on esmolol gtt, and developed seizures in the ED. He was admitted to [facility] and intubated 9/7. Developed increasing secretions found to have Hemophilus pneumonia, treated with antibiotics, ultimately extubated 9/13. Has had an episode of diminished responsiveness for which neurology was reconsulted and reloaded dilantin due to subtherapeutic level. EEG repeated, showing generalized slowing. Nephrology was consulted for AKI and hypernatremia which are improving with IV fluids. Mental status has overall improved with bouts of delirium. Per oral intake is slowly improving with concomitant improvement in mentation.  Assessment & Plan:   Principal Problem:   Recurrent seizures ([place]) Active Problems:   Sleep apnea   Obesity hypoventilation syndrome (HCC)   Hypertension   Status epilepticus ([place])   Fever   Status epilepticus, thought to be hyperglycemia-induced:  - Continue AEDs per neurology: vimpat, dilantin, keppra.  -Patient remains stable.  No seizure activity reported by nursing staff.  Hypernatremia:  Resolved with IVF and po intake. Continue to monitor periodically.  IDDM:  Uncontrolled with hyperglycemia. HbA1c 15.8%. So his AVERAGE blood sugar has been >400mg /dl. Home lantus (U500) dose is 120 units, so suspect a lot of resistance due to obesity and incomplete adherence (confirmed with wife and parents) given insulin requirements here.  -Lantus dose was adjusted.  He is also on SSI.  CBGs are stable.  Continue to monitor closely.   AKI on stage III CKD:  Partially due to urinary retention, hypovolemia. Nephrology was consulted,  started IVFs, holding diuretic and ACE.  Renal function has improved.  CrCl now well above 62ml/min. SPEP without M-spike; K/L LC ratio normal at 1.36. Based on current creatinine, patient does not have chronic kidney disease. - Nephrology signed off 9/20. Monitor UOP, anticipate autodiuresis. Strict I/O. -We will need outpatient follow-up by PCP for proteinuria and hematuria noted on UA.  Possibly due to diabetic nephropathy.    Hypoalbuminemia,  Suspect malnutrition despite morbid obesity:  - Dietitian consulted; start supplementation with magic cup TIDWC and ensure enlive po BID thickened.    Peripheral edema:  Mostly due to inactivity, hypoalbuminemia (malnutrition and proteinuria contributing), peripheral IV's/SVT.  - Mostly upper extremities, not dependent, so will hold on diuretic. No evidence of pulmonary edema.  Hypokalemia  Hypomagnesemia Potassium level normal this morning.  Magnesium is normal as well.  Poorly responsive episodes Possibly post ictal on 9/16, reloaded with dilantin and has improved. No respiratory compromise noted. No seizure activity noted by family at bedside. Resolved.  Acute encephalopathy, acute delirium:  ABG has ruled out hypercarbia. Improving significantly. - Mobilize as able. PT/OT/SLP.  Dysphagia:  Hopeful for improvement with his improved level or alertness. - Continue working with SLP. Continue dysphagia diet and thickened liquids (family educated). - S/p FEES with SLP on 9/25 - recommended regular diet and thin liquids, no SLP follow up - consider GI follow up with concern for primary esophageal issue per speech  Poor IV access:  PICC placed 9/19 RUE.  Acute on chronic hypoxic respiratory failure:  Ongoing poor respiratory status due to OSA, OHS, morbid obesity, obstructive lung disease, CAP. Has been on O2 since 2018 admission for pulmonary infiltrates, possible pneumonia. - Completed  antibiotics 9/17.  - CPAP qHS when able -  Continue supplemental oxygen   Fever:  Afebrile since 9/17 off abx. Cortisol, TSH wnl. ID consulted, feel this is more likely drug-related or central fever.   Hypertensive emergency:  Improved.  - Continue BP medications.   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  Continue home medications.  Hypothyroidism:  TSH 1.415 - Continue synthroid.  Diarrhea:  Has resolved, not a cause of hypokalemia currently. - Negative CDiff  Left cephalic vein SVT:  - Warm compresses. No DVT.  Pruritus: no rash, unclear etiology, will order benadryl, ctm  DVT prophylaxis: Subcutaneous heparin Code Status: full Family Communication: No family at bedside Disposition Plan: Waiting on placement to skilled nursing facility.   Consultants:   Neurology  PCCM  ID  Nephrology  Procedures:  Upper extremity venous Doppler studies Right: No evidence of thrombosis in the subclavian.  Left: No evidence of deep vein thrombosis in the upper extremity. Findings consistent with age indeterminate superficial vein thrombosis involving the left cephalic vein.  EEG 9/17 IMPRESSION: This EEG is characterized by slowing which is consistent with normal drowse.  Can not rule out the possibility of slowing related to general cerebral disturbance such as a metabolic encephalopathy.  Clinical correlation recommended.  No epileptiform activity is noted.    EEG 9/07 >> focal non convulsive status epilepticus from Rt occipital region  LP 9/8 RBC 99, WBC 4, glucose 104, protein 81   CSF 9/07 >> negative  Blood 9/10 >> negative  Sputum 9/10 >>Haemophilus influenzae  C diff PCR 9/12 >>negative  Antimicrobials:  Anti-infectives (From admission, onward)   Start     Dose/Rate Route Frequency Ordered Stop   11/02/17 0900  cefTRIAXone (ROCEPHIN) 1 g in sodium chloride 0.9 % 100 mL IVPB  Status:  Discontinued     1 g 200 mL/hr over 30 Minutes Intravenous Every 24 hours 11/02/17 0850  11/07/17 1411   11/01/17 1000  vancomycin (VANCOCIN) 1,750 mg in sodium chloride 0.9 % 500 mL IVPB  Status:  Discontinued     1,750 mg 250 mL/hr over 120 Minutes Intravenous Every 24 hours 10/31/17 0932 11/02/17 0851   10/31/17 1000  piperacillin-tazobactam (ZOSYN) IVPB 3.375 g  Status:  Discontinued     3.375 g 12.5 mL/hr over 240 Minutes Intravenous Every 8 hours 10/31/17 0928 11/02/17 0850   10/31/17 1000  vancomycin (VANCOCIN) 2,000 mg in sodium chloride 0.9 % 500 mL IVPB     2,000 mg 250 mL/hr over 120 Minutes Intravenous  Once 10/31/17 0932 10/31/17 1334     Subjective: Patient somnolent this morning.  According to nursing staff he did not sleep at all last night.  He is arousable but goes right back to sleep.  Objective: Vitals:   11/18/17 0424 11/18/17 0500 11/18/17 0752 11/18/17 1139  BP: (!) 155/80  (!) 148/78 (!) 155/97  Pulse: 79  80 81  Resp: 20  20 (!) 22  Temp: 98.3 F (36.8 C)  98.5 F (36.9 C) 98 F (36.7 C)  TempSrc: Oral  Oral   SpO2: 94%  95% 97%  Weight:  (!) 139.2 kg    Height:        Intake/Output Summary (Last 24 hours) at 11/18/2017 1340 Last data filed at 11/18/2017 0846 Gross per 24 hour  Intake 720 ml  Output -  Net 720 ml   Filed Weights   11/15/17 0315 11/16/17 0500 11/18/17 0500  Weight: (!) 141.3 kg (!) 147  kg (!) 139.2 kg    Examination:  General: In no distress Cardiovascular: S1-S2 is normal regular.  No S3-S4.  No rubs murmurs or bruit Lungs: Clear to auscultation bilaterally Abdomen: Soft.  Nontender nondistended Neurological: No obvious focal neurological deficits appreciated.    Data Reviewed: I have personally reviewed following labs and imaging studies  CBC: Recent Labs  Lab 11/16/17 0530 11/17/17 0424 11/18/17 0450  WBC 6.2 5.9 5.9  HGB 11.3* 12.0* 11.6*  HCT 36.6* 38.2* 37.0*  MCV 89.9 90.1 88.5  PLT 321 362 850   Basic Metabolic Panel: Recent Labs  Lab 11/13/17 0746 11/14/17 0500 11/15/17 0446  11/16/17 0530 11/17/17 0424 11/18/17 0450  NA 144 142 143 141 140 139  K 3.8 3.2* 3.4* 3.3* 3.9 3.6  CL 112* 106 106 106 103 100  CO2 24 27 28 28 29 30   GLUCOSE 138* 150* 132* 196* 252* 213*  BUN 7 8 8 8 9 8   CREATININE 1.09 1.16 1.09 1.10 1.22 1.07  CALCIUM 8.0* 7.8* 7.9* 8.0* 8.2* 8.4*  MG 1.8  --  1.7 1.6* 2.1 1.8  PHOS 3.4 3.0 3.3 3.8 3.1 3.3   GFR: Estimated Creatinine Clearance: 110.8 mL/min (by C-G formula based on SCr of 1.07 mg/dL). Liver Function Tests: Recent Labs  Lab 11/14/17 0500 11/15/17 0446 11/16/17 0530 11/17/17 0424 11/18/17 0450  ALBUMIN 2.0* 2.0* 2.0* 2.3* 2.2*   CBG: Recent Labs  Lab 11/17/17 1118 11/17/17 1627 11/17/17 2124 11/18/17 0618 11/18/17 1136  GLUCAP 227* 173* 239* 175* 154*     Radiology Studies: No results found.   Scheduled Meds: . aspirin  81 mg Oral Daily  . budesonide (PULMICORT) nebulizer solution  0.25 mg Nebulization BID  . chlorhexidine  15 mL Mouth Rinse BID  . feeding supplement (ENSURE ENLIVE)  237 mL Oral BID BM  . heparin injection (subcutaneous)  5,000 Units Subcutaneous Q8H  . hydrALAZINE  100 mg Oral Q8H  . insulin aspart  0-20 Units Subcutaneous TID WC  . insulin aspart  0-5 Units Subcutaneous QHS  . insulin glargine  15 Units Subcutaneous QHS  . lacosamide  200 mg Oral BID  . levETIRAcetam  1,000 mg Oral BID  . levothyroxine  50 mcg Oral QAC breakfast  . mouth rinse  15 mL Mouth Rinse q12n4p  . metoprolol succinate  50 mg Oral Daily  . pantoprazole  40 mg Oral Daily  . phenytoin  300 mg Oral BID  . sodium chloride flush  10-40 mL Intracatheter Q12H   Continuous Infusions:   LOS: 22 days      Bonnielee Haff, MD [facility] Pager: [email or web address]  If 7PM-7AM, please contact night-coverage [email or web address] Password [email or web address] 11/18/2017, 1:40 PM

## 2017-11-18 NOTE — Progress Notes (Signed)
Patient is more alert and awake, "cracking jokes". Family still at bedside. Pt noted able to move in bed more independently. No other distress noted or voice. Will continue to monitor.   Ave Filter, RN

## 2017-11-18 NOTE — Progress Notes (Signed)
Pt has 87mm of exposed PICC catheter. Previous measurement was 65mm. Dressing reinforced with tape. Pt was instructed not to pull on dressing.

## 2017-11-18 NOTE — Progress Notes (Signed)
Patient appears tired this AM after ADLs. Per wife at bedside and pt that they both "had a rough night" and pt didn't 'sleep until almost 4AM". When he is more awake, he is alert and answer question appropriately. No other distress noted. Will continue to monitor.   Ave Filter, RN

## 2017-11-18 NOTE — Progress Notes (Signed)
Pt refusing CPAP he states he has one at home and will get it up here if he wants one. I told him to call if he changes his mind.

## 2017-11-19 LAB — RENAL FUNCTION PANEL
ALBUMIN: 2.2 g/dL — AB (ref 3.5–5.0)
ANION GAP: 9 (ref 5–15)
BUN: 9 mg/dL (ref 6–20)
CALCIUM: 8 mg/dL — AB (ref 8.9–10.3)
CO2: 29 mmol/L (ref 22–32)
CREATININE: 1.12 mg/dL (ref 0.61–1.24)
Chloride: 101 mmol/L (ref 98–111)
GFR calc Af Amer: >60 mL/min (ref >60)
GFR calc non Af Amer: >60 mL/min (ref >60)
GLUCOSE: 216 mg/dL — AB (ref 70–99)
PHOSPHORUS: 3.9 mg/dL (ref 2.5–4.6)
Potassium: 3.7 mmol/L (ref 3.5–5.1)
SODIUM: 139 mmol/L (ref 135–145)

## 2017-11-19 LAB — GLUCOSE, CAPILLARY
GLUCOSE-CAPILLARY: 223 mg/dL — AB (ref 70–99)
GLUCOSE-CAPILLARY: 295 mg/dL — AB (ref 70–99)
Glucose-Capillary: 203 mg/dL — ABNORMAL HIGH (ref 70–99)
Glucose-Capillary: 184 mg/dL — ABNORMAL HIGH (ref 70–99)

## 2017-11-19 NOTE — Progress Notes (Signed)
Patient refusing CPAP.

## 2017-11-19 NOTE — Progress Notes (Signed)
PROGRESS NOTE    Daniel [name]  JME:[id number] DOB: [birth year] DOA: 10/27/2017 PCP: Antonietta Jewel, MD   Brief Narrative:  Daniel [name] is a 52 y.o. male with a history of seizure disorder, IDDM, OSA, OHS, morbid obesity, HTN, IDDM, and PE who presented to [facility] with confusion, headache, hallucinations, and falls found to have hypertensive urgency, started on esmolol gtt, and developed seizures in the ED. He was admitted to [facility] and intubated 9/7. Developed increasing secretions found to have Hemophilus pneumonia, treated with antibiotics, ultimately extubated 9/13. Has had an episode of diminished responsiveness for which neurology was reconsulted and reloaded dilantin due to subtherapeutic level. EEG repeated, showing generalized slowing. Nephrology was consulted for AKI and hypernatremia which are improving with IV fluids. Mental status has overall improved with bouts of delirium. Per oral intake is slowly improving with concomitant improvement in mentation.  Assessment & Plan:   Principal Problem:   Recurrent seizures ([place]) Active Problems:   Sleep apnea   Obesity hypoventilation syndrome (HCC)   Hypertension   Status epilepticus ([place])   Fever   Status epilepticus, thought to be hyperglycemia-induced:  - Continue AEDs per neurology: vimpat, dilantin, keppra.  -Patient remains stable.  No further seizure activity.  Hypernatremia:  Resolved with IVF and po intake. Continue to monitor periodically.  IDDM:  Uncontrolled with hyperglycemia. HbA1c 15.8%.   -Lantus dose was adjusted.  He is also on SSI.  CBGs are stable.  Continue to monitor closely.   AKI on stage III CKD:  Partially due to urinary retention, hypovolemia. Nephrology was consulted, started IVFs, holding diuretic and ACE.  Renal function is now back to baseline. SPEP without M-spike; K/L LC ratio normal at 1.36. Based on current creatinine, patient does not have chronic kidney disease. - Nephrology signed off 9/20.  -We  will need outpatient follow-up by PCP for proteinuria and hematuria noted on UA.  Possibly due to diabetic nephropathy.    Hypoalbuminemia,  Suspect malnutrition despite morbid obesity:  - Dietitian consulted; start supplementation with magic cup TIDWC and ensure enlive po BID thickened.    Peripheral edema:  Mostly due to inactivity, hypoalbuminemia (malnutrition and proteinuria contributing), peripheral IV's/SVT.  - Mostly upper extremities, not dependent, so will hold on diuretic. No evidence of pulmonary edema.  Hypokalemia  Hypomagnesemia Electrolytes have been stable.  Acute encephalopathy, acute delirium:  Most likely due to seizures.  Now back to baseline.  Dysphagia:  Hopeful for improvement with his improved level or alertness. - Continue working with SLP. Continue dysphagia diet and thickened liquids (family educated). - S/p FEES with SLP on 9/25 - recommended regular diet and thin liquids, no SLP follow up - consider GI follow up with concern for primary esophageal issue per speech  Poor IV access:  PICC placed 9/19 RUE.  Acute on chronic hypoxic respiratory failure:  Ongoing poor respiratory status due to OSA, OHS, morbid obesity, obstructive lung disease, CAP. Has been on O2 since 2018 admission for pulmonary infiltrates, possible pneumonia. - Completed antibiotics 9/17.  - CPAP qHS when able - Continue supplemental oxygen   Fever:  Afebrile since 9/17 off abx. Cortisol, TSH wnl. ID consulted, feel this is more likely drug-related or central fever.   Hypertensive emergency:  Improved.  - Continue BP medications.   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  Continue home medications.  Hypothyroidism:  TSH 1.415 - Continue synthroid.  Diarrhea:  Has resolved, not a cause of hypokalemia currently. - Negative CDiff  Left cephalic vein SVT:  - Warm compresses. No DVT.  Pruritus Stable.  No rash was identified.  DVT prophylaxis:  Subcutaneous heparin Code Status: full Family Communication: No family at bedside Disposition Plan: Waiting on placement to skilled nursing facility.   Consultants:   Neurology  PCCM  ID  Nephrology  Procedures:  Upper extremity venous Doppler studies Right: No evidence of thrombosis in the subclavian.  Left: No evidence of deep vein thrombosis in the upper extremity. Findings consistent with age indeterminate superficial vein thrombosis involving the left cephalic vein.  EEG 9/17 IMPRESSION: This EEG is characterized by slowing which is consistent with normal drowse.  Can not rule out the possibility of slowing related to general cerebral disturbance such as a metabolic encephalopathy.  Clinical correlation recommended.  No epileptiform activity is noted.    EEG 9/07 >> focal non convulsive status epilepticus from Rt occipital region  LP 9/8 RBC 99, WBC 4, glucose 104, protein 81   CSF 9/07 >> negative  Blood 9/10 >> negative  Sputum 9/10 >>Haemophilus influenzae  C diff PCR 9/12 >>negative  Antimicrobials:  Anti-infectives (From admission, onward)   Start     Dose/Rate Route Frequency Ordered Stop   11/02/17 0900  cefTRIAXone (ROCEPHIN) 1 g in sodium chloride 0.9 % 100 mL IVPB  Status:  Discontinued     1 g 200 mL/hr over 30 Minutes Intravenous Every 24 hours 11/02/17 0850 11/07/17 1411   11/01/17 1000  vancomycin (VANCOCIN) 1,750 mg in sodium chloride 0.9 % 500 mL IVPB  Status:  Discontinued     1,750 mg 250 mL/hr over 120 Minutes Intravenous Every 24 hours 10/31/17 0932 11/02/17 0851   10/31/17 1000  piperacillin-tazobactam (ZOSYN) IVPB 3.375 g  Status:  Discontinued     3.375 g 12.5 mL/hr over 240 Minutes Intravenous Every 8 hours 10/31/17 0928 11/02/17 0850   10/31/17 1000  vancomycin (VANCOCIN) 2,000 mg in sodium chloride 0.9 % 500 mL IVPB     2,000 mg 250 mL/hr over 120 Minutes Intravenous  Once 10/31/17 0932 10/31/17 1334      Subjective: Patient is awake alert this morning.  Patient's mother is at the bedside.  Denies any chest pain shortness of breath.  No nausea or vomiting.  Good appetite.   Objective: Vitals:   11/19/17 0500 11/19/17 0739 11/19/17 0744 11/19/17 1154  BP:  (!) 159/83  (!) 144/79  Pulse:  84  81  Resp:  20  18  Temp:  98.2 F (36.8 C)  97.9 F (36.6 C)  TempSrc:  Oral    SpO2:  94% 93% 96%  Weight: (!) 137.5 kg     Height:        Intake/Output Summary (Last 24 hours) at 11/19/2017 1316 Last data filed at 11/19/2017 1245 Gross per 24 hour  Intake 970 ml  Output 500 ml  Net 470 ml   Filed Weights   11/16/17 0500 11/18/17 0500 11/19/17 0500  Weight: (!) 147 kg (!) 139.2 kg (!) 137.5 kg    Examination:  General: Awake alert.  In no distress Cardiovascular: S1-S2 is normal regular.  No S3-S4 Lungs: Clear to auscultation bilaterally Abdomen: Abdomen soft.  Nontender nondistended Neurological: Alert and oriented x3.  No focal neurological deficits.    Data Reviewed: I have personally reviewed following labs and imaging studies  CBC: Recent Labs  Lab 11/16/17 0530 11/17/17 0424 11/18/17 0450  WBC 6.2 5.9 5.9  HGB 11.3* 12.0* 11.6*  HCT 36.6* 38.2* 37.0*  MCV 89.9 90.1 88.5  PLT 321 362 297   Basic Metabolic Panel: Recent Labs  Lab 11/13/17 0746  11/15/17 0446 11/16/17 0530 11/17/17 0424 11/18/17 0450 11/19/17 0615  NA 144   < > 143 141 140 139 139  K 3.8   < > 3.4* 3.3* 3.9 3.6 3.7  CL 112*   < > 106 106 103 100 101  CO2 24   < > 28 28 29 30 29   GLUCOSE 138*   < > 132* 196* 252* 213* 216*  BUN 7   < > 8 8 9 8 9   CREATININE 1.09   < > 1.09 1.10 1.22 1.07 1.12  CALCIUM 8.0*   < > 7.9* 8.0* 8.2* 8.4* 8.0*  MG 1.8  --  1.7 1.6* 2.1 1.8  --   PHOS 3.4   < > 3.3 3.8 3.1 3.3 3.9   < > = values in this interval not displayed.   GFR: Estimated Creatinine Clearance: 105.1 mL/min (by C-G formula based on SCr of 1.12 mg/dL). Liver Function Tests: Recent Labs   Lab 11/15/17 0446 11/16/17 0530 11/17/17 0424 11/18/17 0450 11/19/17 0615  ALBUMIN 2.0* 2.0* 2.3* 2.2* 2.2*   CBG: Recent Labs  Lab 11/18/17 1136 11/18/17 1618 11/18/17 2140 11/19/17 0611 11/19/17 1105  GLUCAP 154* 261* 179* 203* 184*     Radiology Studies: No results found.   Scheduled Meds: . aspirin  81 mg Oral Daily  . budesonide (PULMICORT) nebulizer solution  0.25 mg Nebulization BID  . chlorhexidine  15 mL Mouth Rinse BID  . feeding supplement (ENSURE ENLIVE)  237 mL Oral BID BM  . heparin injection (subcutaneous)  5,000 Units Subcutaneous Q8H  . hydrALAZINE  100 mg Oral Q8H  . insulin aspart  0-20 Units Subcutaneous TID WC  . insulin aspart  0-5 Units Subcutaneous QHS  . insulin glargine  15 Units Subcutaneous QHS  . lacosamide  200 mg Oral BID  . levETIRAcetam  1,000 mg Oral BID  . levothyroxine  50 mcg Oral QAC breakfast  . mouth rinse  15 mL Mouth Rinse q12n4p  . metoprolol succinate  50 mg Oral Daily  . pantoprazole  40 mg Oral Daily  . phenytoin  300 mg Oral BID  . sodium chloride flush  10-40 mL Intracatheter Q12H   Continuous Infusions:   LOS: 23 days      Bonnielee Haff, MD [facility] Pager: [email or web address]  If 7PM-7AM, please contact night-coverage [email or web address] Password [email or web address] 11/19/2017, 1:16 PM

## 2017-11-19 NOTE — Progress Notes (Signed)
   11/19/17 1800  Pressure Injury Prevention  Positioning Frequency Able to turn self  Mobility  Activity Ambulated in room;Transferred:  Bed to chair  Range of Motion Active;All extremities  Level of Assistance Independent  Assistive Device Stedy  Bed Position Chair     Patient tolerated ambulation from bed to wheelchair at this time. Denied pain. Wife at bedside.    Ave Filter, RN

## 2017-11-20 ENCOUNTER — Inpatient Hospital Stay (HOSPITAL_COMMUNITY): Payer: Medicaid Other

## 2017-11-20 DIAGNOSIS — G40909 Epilepsy, unspecified, not intractable, without status epilepticus: Secondary | ICD-10-CM

## 2017-11-20 DIAGNOSIS — D62 Acute posthemorrhagic anemia: Secondary | ICD-10-CM

## 2017-11-20 DIAGNOSIS — Z91199 Patient's noncompliance with other medical treatment and regimen due to unspecified reason: Secondary | ICD-10-CM

## 2017-11-20 DIAGNOSIS — E119 Type 2 diabetes mellitus without complications: Secondary | ICD-10-CM

## 2017-11-20 DIAGNOSIS — E1169 Type 2 diabetes mellitus with other specified complication: Secondary | ICD-10-CM

## 2017-11-20 DIAGNOSIS — R0989 Other specified symptoms and signs involving the circulatory and respiratory systems: Secondary | ICD-10-CM

## 2017-11-20 DIAGNOSIS — E669 Obesity, unspecified: Secondary | ICD-10-CM

## 2017-11-20 DIAGNOSIS — Z9119 Patient's noncompliance with other medical treatment and regimen: Secondary | ICD-10-CM

## 2017-11-20 DIAGNOSIS — G4733 Obstructive sleep apnea (adult) (pediatric): Secondary | ICD-10-CM

## 2017-11-20 DIAGNOSIS — R509 Fever, unspecified: Secondary | ICD-10-CM

## 2017-11-20 DIAGNOSIS — N179 Acute kidney failure, unspecified: Secondary | ICD-10-CM

## 2017-11-20 LAB — RENAL FUNCTION PANEL
ANION GAP: 8 (ref 5–15)
Albumin: 2.3 g/dL — ABNORMAL LOW (ref 3.5–5.0)
BUN: 15 mg/dL (ref 6–20)
CALCIUM: 8.1 mg/dL — AB (ref 8.9–10.3)
CHLORIDE: 103 mmol/L (ref 98–111)
CO2: 26 mmol/L (ref 22–32)
Creatinine, Ser: 1.23 mg/dL (ref 0.61–1.24)
GFR calc non Af Amer: >60 mL/min (ref >60)
Glucose, Bld: 235 mg/dL — ABNORMAL HIGH (ref 70–99)
Phosphorus: 3.8 mg/dL (ref 2.5–4.6)
Potassium: 4.5 mmol/L (ref 3.5–5.1)
SODIUM: 137 mmol/L (ref 135–145)

## 2017-11-20 LAB — GLUCOSE, CAPILLARY
GLUCOSE-CAPILLARY: 247 mg/dL — AB (ref 70–99)
GLUCOSE-CAPILLARY: 240 mg/dL — AB (ref 70–99)
Glucose-Capillary: 220 mg/dL — ABNORMAL HIGH (ref 70–99)
Glucose-Capillary: 286 mg/dL — ABNORMAL HIGH (ref 70–99)

## 2017-11-20 MED ORDER — INSULIN ASPART 100 UNIT/ML ~~LOC~~ SOLN: 5.0000 [IU] | Freq: Three times a day (TID) | SUBCUTANEOUS | Status: DC

## 2017-11-20 MED ORDER — INSULIN GLARGINE 100 UNIT/ML ~~LOC~~ SOLN
20.0000 [IU] | Freq: Every day | SUBCUTANEOUS | Status: DC
  Administered 2017-11-21 (×2): 20 [IU] via SUBCUTANEOUS
  Filled 2017-11-20 (×3): qty 0.2

## 2017-11-20 MED ORDER — INSULIN ASPART 100 UNIT/ML ~~LOC~~ SOLN
4.0000 [IU] | Freq: Three times a day (TID) | SUBCUTANEOUS | Status: DC
  Administered 2017-11-21 – 2017-11-22 (×3): 4 [IU] via SUBCUTANEOUS

## 2017-11-20 NOTE — Progress Notes (Addendum)
LATE NOTE ENTRY    CSW following for discharge plan. After discussion with CSW AD and Financial counselor, it is unclear whether patient will qualify for Medicaid due to his financial status. He has been approved for SSI disability and receives a long term disability through work; unable to approve LOG if patient will not qualify for Medicaid. CSW AD is placing patient on difficult to place list.   CSW to follow.  Laveda Abbe, [place] Clinical Social Worker [phone]

## 2017-11-20 NOTE — Consult Note (Signed)
Physical Medicine and Rehabilitation Consult   Reason for Consult: Debility due to status epilepticus.   Referring Physician:  Dr. Fayrene Helper    HPI: Daniel [name] is a 52 y.o. male with history of PE, OSA, morbid obesity, 3 to 4 weeks of progressive encephalopathy with hallucinations and falls who was admitted to outside hospital for work-up.  History taken from chart review and patient.  He was found to have malignant hypertension with periods of unresponsiveness due to seizures.  He was started on Keppra and Vimpat with continued episodes and transferred to [facility] on 10/27/2017 for continuous EEG monitoring.  Neurology consulted for assistance with status epilepticus and he was intubated for airway protection.  Hospital course significant for fevers question due to Hemophilus pneumoniae CAP, acute on chronic renal failure with decreasing urine output, issues with hyperglycemia, dysphagia, issues with diarrhea and encephalopathy. He tolerated extubation on 09/13 and has been refusing to use CPAP. He developed recurrent fevers on 9/17 and was pan cultured.  Hypernatremia and worsening of renal status felt to be due to volume depletion as well as urinary retention.  Lisinopril placed on hold and patient treated with IV fluids for hydration.    Dr. Johnnye Sima recommended dose attenuation of ceftriaxone and questioned fevers due to seizures versus medication related. LUE + superficial thrombosis in cephalic vein---treated with local measures.  He has defervesced and mentation is improving.  Blood sugars continue to be poorly controlled with patient reporting baseline blood sugars greater than 400.  He is showing improvement in activity tolerance but continues to be limited by confusion, cognitive deficits with poor awareness of deficits, poor safety awareness and balance deficits. CIR recommended due to functional decline.    Review of Systems  Constitutional: Negative for chills  and fever.  HENT: Negative for hearing loss and tinnitus.   Eyes: Negative for blurred vision, double vision and photophobia.  Respiratory: Negative for cough.   Cardiovascular: Negative for chest pain and palpitations.  Gastrointestinal: Negative for abdominal pain, heartburn and nausea.  Musculoskeletal: Positive for myalgias.  Skin: Negative for rash.  Neurological: Positive for weakness. Negative for headaches.  Psychiatric/Behavioral: Positive for memory loss.  All other systems reviewed and are negative.     Past Medical History:  Diagnosis Date  . Arthritis    "elbows and wrists" (11/13/2017)  . CHF (congestive heart failure) ([place]) 08/2016  . CKD stage 3 due to type 2 diabetes mellitus ([place]) 06/2016  . Diabetes mellitus type 2 in obese ([place])   . High cholesterol   . Hypertension   . Hypothyroidism   . Morbid obesity ([place])   . On home oxygen therapy    "3L; now only at night" (11/13/2017)  . OSA on CPAP   . Pneumonia 2017   "double pneumonia"  . Pneumonia 10/2017   "while in ICU" (11/13/2017)  . Pulmonary embolism ([place])   . Seizure ([place])    "10/24/2017-10/28/2017; couldn't get them stopped til medically induced coma. after they brought him out he started having more; can't find RX to stop them" (11/13/2017)  . Stroke [facility])    "scans showed several mini strokes in the past; nothing recent" (11/13/2017)    Past Surgical History:  Procedure Laterality Date  . CARDIAC CATHETERIZATION  12/2016    Family History  Problem Relation Age of Onset  . Alpha-1 antitrypsin deficiency Neg Hx   . Asthma Neg Hx   . Breast cancer Neg Hx   .  Stroke Neg Hx   . Colon cancer Neg Hx   . Coronary artery disease Neg Hx   . COPD Neg Hx   . Cystic fibrosis Neg Hx   . Diabetes Neg Hx   . Deep vein thrombosis Neg Hx   . Hypertension Neg Hx   . Hyperlipidemia Neg Hx   . Kidney disease Neg Hx   . Liver disease Neg Hx   . Lung cancer Neg Hx   . Lupus Neg Hx   . Neurofibromatosis Neg Hx     . Osteoporosis Neg Hx   . Ovarian cancer Neg Hx   . Positive PPD/TB Exposure Neg Hx   . Prostate cancer Neg Hx   . Pulmonary embolism Neg Hx   . Pulmonary fibrosis Neg Hx   . Rheum arthritis Neg Hx   . Sarcoidosis Neg Hx   . Sleep apnea Neg Hx   . Thyroid disease Neg Hx   . Tuberculosis Neg Hx   . Tuberous sclerosis Neg Hx     Social History:  Married. Disabled for a couple of years due to lung issues. He reports that he has never smoked. He quit smokeless tobacco use about 4 years ago.  His smokeless tobacco use included chew. He reports that he does not drink alcohol anymore. He reports that he does not use drugs.   Allergies  Allergen Reactions  . Montelukast Other (See Comments)    Possibly cause headaches per spouse    Medications Prior to Admission  Medication Sig Dispense Refill  . albuterol (PROVENTIL HFA) 108 (90 Base) MCG/ACT inhaler Inhale 1 puff into the lungs every 6 (six) hours as needed for wheezing or shortness of breath.     Marland Kitchen aspirin EC 81 MG tablet Take 81 mg by mouth daily.     . fluticasone furoate-vilanterol (BREO ELLIPTA) 200-25 MCG/INH AEPB Inhale 1 puff into the lungs daily.     . hydrALAZINE (APRESOLINE) 100 MG tablet Take 100 mg by mouth 3 (three) times daily.     Marland Kitchen HYDROcodone-acetaminophen (NORCO/VICODIN) 5-325 MG tablet Take 1 tablet by mouth every 8 (eight) hours as needed (pain).     . Insulin Detemir (LEVEMIR FLEXTOUCH) 100 UNIT/ML Pen Inject 120 Units into the skin daily.    Marland Kitchen levothyroxine (SYNTHROID, LEVOTHROID) 50 MCG tablet Take 50 mcg by mouth daily.     Marland Kitchen lisinopril (PRINIVIL,ZESTRIL) 10 MG tablet Take 10 mg by mouth daily.  11  . metFORMIN (GLUCOPHAGE) 500 MG tablet Take 500 mg by mouth 2 (two) times daily with a meal.   11  . omeprazole (PRILOSEC) 20 MG capsule Take 20 mg by mouth daily as needed (acid reflux/heartburn).     . torsemide (DEMADEX) 20 MG tablet Take 20 mg by mouth daily.       Home: Home Living Family/patient expects  to be discharged to:: Inpatient rehab Living Arrangements: Spouse/significant other Available Help at Discharge: Family, Available 24 hours/day Type of Home: House Home Equipment: None Additional Comments: Information per chart. Pt unable to provide and family left room during evaluation.   Functional History: Prior Function Level of Independence: Needs assistance Gait / Transfers Assistance Needed: Walked without help until a few days before coming to hospital  ADL's / Homemaking Assistance Needed: Needed wife Assist about a month prior to coming to hospital Comments: Pt was independent until 1 month ago when confusion began to increase.  Functional Status:  Mobility: Bed Mobility Overal bed mobility: Needs Assistance Bed Mobility: Supine  to Sit Rolling: Mod assist Supine to sit: Min guard Sit to supine: Max assist, +2 for physical assistance General bed mobility comments: increased time and effort; min guard for safety Transfers Overall transfer level: Needs assistance Equipment used: Rolling walker (2 wheeled)(gait belt) Transfers: Sit to/from Stand Sit to Stand: Min assist, +2 physical assistance General transfer comment: assist to power up into standing and for balance upon standing from EOB and recliner; cues for safe hand placement Ambulation/Gait Ambulation/Gait assistance: Mod assist, +2 safety/equipment Gait Distance (Feet): (50ft X2) Assistive device: Rolling walker (2 wheeled) Gait Pattern/deviations: Step-to pattern, Decreased step length - right, Decreased step length - left, Decreased dorsiflexion - right, Decreased dorsiflexion - left, Wide base of support, Trunk flexed General Gait Details: assistance for balance when weight shifting and to guide RW; multimodal cues for hips extension and vc for safe proximity to RW and increased bilat step lengths Gait velocity: decreased    ADL: ADL Overall ADL's : Needs assistance/impaired Grooming: Wash/dry hands, Wash/dry  face, Oral care, Standing(standing in lift bed) Grooming Details (indicate cue type and reason): increased time and encouragmenet to engage in self care tasks, at time reporting "my wife will do it for me' Toileting- Water quality scientist and Hygiene: Total assistance, +2 for physical assistance, +2 for safety/equipment, Bed level Toileting - Clothing Manipulation Details (indicate cue type and reason): soiled brief and bed pads General ADL Comments: session focused on dynamic reaching with B UEs in tilt bed standing   Cognition: Cognition Overall Cognitive Status: Impaired/Different from baseline Orientation Level: Oriented X4 Cognition Arousal/Alertness: Awake/alert Behavior During Therapy: WFL for tasks assessed/performed Overall Cognitive Status: Impaired/Different from baseline Area of Impairment: Attention, Following commands, Safety/judgement, Problem solving, Memory, Awareness Orientation Level: Disoriented to, Time, Place Current Attention Level: Selective Memory: Decreased short-term memory Following Commands: Follows one step commands with increased time, Follows one step commands consistently, Follows multi-step commands inconsistently Safety/Judgement: Decreased awareness of safety, Decreased awareness of deficits Awareness: Emergent Problem Solving: Requires verbal cues General Comments: Pt more alert today, very talkative and appropriately joking at times.  Completely unaware he is in the hospital.    Blood pressure (!) 110/46, pulse 86, temperature 98.9 F (37.2 C), temperature source Oral, resp. rate 20, height 5\' 9"  (1.753 m), weight 135.7 kg, SpO2 97 %. Physical Exam  Nursing note and vitals reviewed. Constitutional: He appears well-developed. He is sleeping and cooperative. He is easily aroused.  Morbidly obese male. NAD.   HENT:  Head: Normocephalic and atraumatic.  Eyes: EOM are normal. Right eye exhibits no discharge. Left eye exhibits no discharge.  Neck:  Normal range of motion. Neck supple.  Cardiovascular: Normal rate and regular rhythm.  Respiratory: Effort normal and breath sounds normal.  GI: Soft. Bowel sounds are normal.  Musculoskeletal: He exhibits edema (2+ LUE). He exhibits no tenderness.  Neurological: He is alert and easily aroused.  Tangential and distracted.  Kept left eye closed during exam--reports "habit".   He was able to answer basic orientation questions once rapport established and redirected.  Able to follow simple motor commands.  Motor: 4-4+/5 proximal distal throughout  Skin: Skin is warm and dry.  Psychiatric: His affect is blunt. His speech is delayed.    Results for orders placed or performed during the hospital encounter of 10/27/17 (from the past 24 hour(s))  Glucose, capillary     Status: Abnormal   Collection Time: 11/19/17 11:05 AM  Result Value Ref Range   Glucose-Capillary 184 (H) 70 - 99 mg/dL  Comment 1 Notify RN    Comment 2 Document in Chart   Glucose, capillary     Status: Abnormal   Collection Time: 11/19/17  4:16 PM  Result Value Ref Range   Glucose-Capillary 223 (H) 70 - 99 mg/dL   Comment 1 Notify RN    Comment 2 Document in Chart   Glucose, capillary     Status: Abnormal   Collection Time: 11/19/17  9:10 PM  Result Value Ref Range   Glucose-Capillary 295 (H) 70 - 99 mg/dL   Comment 1 Notify RN    Comment 2 Document in Chart   Glucose, capillary     Status: Abnormal   Collection Time: 11/20/17  6:14 AM  Result Value Ref Range   Glucose-Capillary 220 (H) 70 - 99 mg/dL   Comment 1 Notify RN    Comment 2 Document in Chart   Renal function panel     Status: Abnormal   Collection Time: 11/20/17  7:09 AM  Result Value Ref Range   Sodium 137 135 - 145 mmol/L   Potassium 4.5 3.5 - 5.1 mmol/L   Chloride 103 98 - 111 mmol/L   CO2 26 22 - 32 mmol/L   Glucose, Bld 235 (H) 70 - 99 mg/dL   BUN 15 6 - 20 mg/dL   Creatinine, Ser 1.23 0.61 - 1.24 mg/dL   Calcium 8.1 (L) 8.9 - 10.3 mg/dL    Phosphorus 3.8 2.5 - 4.6 mg/dL   Albumin 2.3 (L) 3.5 - 5.0 g/dL   GFR calc non Af Amer >60 >60 mL/min   GFR calc Af Amer >60 >60 mL/min   Anion gap 8 5 - 15   Dg Chest Port 1 View  Result Date: 11/20/2017 CLINICAL DATA:  PICC placement EXAM: PORTABLE CHEST 1 VIEW COMPARISON:  11/09/2017 FINDINGS: PICC line has been retracted and now loops in the right upper chest, possibly into the internal jugular vein with the tip in the right brachiocephalic vein. Cardiomegaly. Low lung volumes without confluent opacity or effusion. IMPRESSION: Right PICC line loops in the right upper chest, likely looping in the right internal jugular vein with the tip in the right brachiocephalic vein. Electronically Signed   By: Rolm Baptise M.D.   On: 11/20/2017 10:23    Assessment/Plan: Diagnosis: Debility  Labs independently reviewed.  Records reviewed and summated above.  1. Does the need for close, 24 hr/day medical supervision in concert with the patient's rehab needs make it unreasonable for this patient to be served in a less intensive setting? YesCo-Morbidities requiring supervision/potential 2.  complications: PE, OSA (monitor for daytime somnolence and energy levels, encouraged compliance), morbid obesity (encourage weight loss), encephalopathy with hallucinations, fevers (cont to monitor for signs and symptoms of infection, further workup if indicated), acute on chronic renal failure (avoid nephrotoxic meds), medication noncompliance (current), uncontrolled blood glucose (Monitor in accordance with exercise and adjust meds as necessary), labile blood pressure (monitor), ABLA (transfuse if necessary to ensure appropriate perfusion for increased activity tolerance) 3. Due to safety, skin/wound care, disease management, pain management and patient education, does the patient require 24 hr/day rehab nursing? Yes 4. Does the patient require coordinated care of a physician, rehab nurse, PT (1-2 hrs/day, 5 days/week), OT  (1-2 hrs/day, 5 days/week) and SLP (1-2 hrs/day, 5 days/week) to address physical and functional deficits in the context of the above medical diagnosis(es)? Yes Addressing deficits in the following areas: balance, endurance, locomotion, strength, transferring, bowel/bladder control, bathing, dressing, feeding, toileting, cognition and  psychosocial support 5. Can the patient actively participate in an intensive therapy program of at least 3 hrs of therapy per day at least 5 days per week? Yes 6. The potential for patient to make measurable gains while on inpatient rehab is good 7. Anticipated functional outcomes upon discharge from inpatient rehab are supervision and min assist  with PT, supervision and min assist with OT, modified independent with SLP. 8. Estimated rehab length of stay to reach the above functional goals is: 13-16 days. 9. Anticipated D/C setting: Home 10. Anticipated post D/C treatments: HH therapy and Home excercise program 11. Overall Rehab/Functional Prognosis: good  RECOMMENDATIONS: This patient's condition is appropriate for continued rehabilitative care in the following setting: CIR if caregiver support available and patient willing to comply with recommendations Patient has agreed to participate in recommended program. Potentially Note that insurance prior authorization may be required for reimbursement for recommended care.  Comment: Rehab Admissions Coordinator to follow up.   I have personally performed a face to face diagnostic evaluation, including, but not limited to relevant history and physical exam findings, of this patient and developed relevant assessment and plan.  Additionally, I have reviewed and concur with the physician assistant's documentation above.   Delice Lesch, MD, ABPMR Bary Leriche, PA-C 11/20/2017

## 2017-11-20 NOTE — Progress Notes (Signed)
PICC noted to be pulled back and malposition looped in the Right IJ.  Floor RN notified to contact MD for order to remove or exchange PICC if still needed.

## 2017-11-20 NOTE — Care Management Note (Signed)
Case Management Note  Patient Details  Name: Daniel [name] MRN: [id number] Date of Birth: 1965-09-29  Subjective/Objective:                    Action/Plan: Pt on the DTP list for SNF. New recommendations for CIR. Awaiting CIR MD to assess. CM following.  Expected Discharge Date:                  Expected Discharge Plan:  Skilled Nursing Facility  In-House Referral:  Clinical Social Work  Discharge planning Services     Post Acute Care Choice:    Choice offered to:     DME Arranged:    DME Agency:     HH Arranged:    [place] Agency:     Status of Service:  In process, will continue to follow  If discussed at Long Length of Stay Meetings, dates discussed:    Additional Comments:  Pollie Friar, RN 11/20/2017, 11:50 AM

## 2017-11-20 NOTE — Progress Notes (Signed)
PROGRESS NOTE    Daniel [name]  HER:[id number] DOB: 11/04/1965 DOA: 10/27/2017 PCP: Antonietta Jewel, MD   Brief Narrative:  Daniel [name] Daniel [name] Daniel [name] 52 y.o.malewith Daniel [name] history of seizure disorder, IDDM, OSA, OHS, morbid obesity, HTN, IDDM, and PE who presented to [facility] with confusion, headache, hallucinations, and falls found to have hypertensive urgency, started on esmolol gtt, and developed seizures in the ED. He was admitted to [facility] and intubated 9/7. Developed increasing secretions found to have Hemophilus pneumonia, treated with antibiotics, ultimately extubated 9/13. Has had an episode of diminished responsiveness for which neurology was reconsulted and reloaded dilantin due to subtherapeutic level. EEG repeated, showing generalized slowing. Nephrology was consulted for AKI and hypernatremia which are improving with IV fluids. Mental status has overall improved with bouts of delirium. Per oral intake is slowly improvingwith concomitant improvement in mentation.  Assessment & Plan:   Principal Problem:   Recurrent seizures ([place]) Active Problems:   Sleep apnea   Obesity hypoventilation syndrome (HCC)   Hypertension   Status epilepticus (HCC)   Fever   OSA (obstructive sleep apnea)   Noncompliance   FUO (fever of unknown origin)   AKI (acute kidney injury) ([place])   Diabetes mellitus type 2 in nonobese ([place])   Diabetes mellitus type 2 in obese ([place])   Acute blood loss anemia   Labile blood pressure   Status epilepticus, thought to be hyperglycemia-induced:  - Continue AEDs per neurology: vimpat, dilantin, keppra.  -Patient remains stable.  No further seizure activity. - continue to monitor dilantin per pharmacy  Hypernatremia:  Resolved with IVF and po intake. Continue to monitor periodically.  IDDM:  Uncontrolled with hyperglycemia. HbA1c 15.8%.   -Lantus dose was adjusted (increased to 20 units).  Add mealtime insulin.  He is also on SSI.  Of note, was on much higher dose of insulin  at home, continue to follow for adjustment.  AKI on stage III CKD:  Partially due to urinary retention, hypovolemia. Nephrology was consulted, started IVFs, holding diuretic and ACE.  Renal function is now back to baseline. SPEP without M-spike; K/L LC ratio normal at 1.36.Based on current creatinine, patient does not have chronic kidney disease. - Nephrology signed off 9/20.  -We will need outpatient follow-up by PCP for proteinuria and hematuria noted on UA.  Possibly due to diabetic nephropathy.    Hypoalbuminemia,  Suspect malnutrition despite morbid obesity:  - Dietitian consulted; start supplementation with magic cup TIDWC and ensure enlive po BID thickened.   Peripheral edema:  Mostly due to inactivity, hypoalbuminemia (malnutrition and proteinuria contributing), peripheral IV's/SVT.  - Mostly upper extremities, not dependent, so will hold on diuretic. No evidence of pulmonary edema.  Hypokalemia  Hypomagnesemia Electrolytes have been stable.  Acute encephalopathy, acute delirium:  Most likely due to seizures.  Now back to baseline.  Dysphagia:  Hopeful for improvement with his improved level or alertness. - Continue working with SLP. Continue dysphagia diet and thickened liquids (family educated). - S/p FEES with SLP on 9/25 - recommended regular diet and thin liquids, no SLP follow up - consider GI follow up with concern for primary esophageal issue per speech  Poor IV access:  PICC placed 9/19 RUE, but malpositioned today -> will remove PICC and place PIV.  Will see if he does well without PICC, consider replacing if difficulty with access or blood draws.  Acute on chronic hypoxic respiratory failure:  Ongoing poor respiratory status due to OSA, OHS, morbid obesity, obstructive lung disease, CAP. Has  been on O2 since 2018 admission for pulmonary infiltrates, possible pneumonia. - Completed antibiotics 9/17.  - CPAP qHS when able - Continue supplemental oxygen    Fever:  Afebrile since 9/17 off abx. Cortisol, TSH wnl. ID consulted, feel this is more likely drug-related or central fever.   Hypertensive emergency:  Improved.  - Continue BP medications.   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  Continue home medications.  Hypothyroidism:  TSH 1.415 - Continue synthroid.  Diarrhea:  Has resolved, not Daniel [name] cause of hypokalemia currently. - Negative CDiff  Left cephalic vein SVT:  - Warm compresses. No DVT.  Pruritus Stable.  No rash was identified.    DVT prophylaxis: heparin Code Status: full Family Communication: wife at bedside Disposition Plan: pending placement.  CIR eval today.  Consultants:   Neurology  PCCM  ID  Nephrology  Rehab  Procedures:  Upper extremity venous Doppler studies Right: No evidence of thrombosis in the subclavian.  Left: No evidence of deep vein thrombosis in the upper extremity. Findings consistent with age indeterminate superficial vein thrombosis involving the left cephalic vein.  EEG 9/17 IMPRESSION: This EEG is characterized by slowing which is consistent with normal drowse. Can not rule out the possibility of slowing related to general cerebral disturbance such as Daniel [name] metabolic encephalopathy. Clinical correlation recommended. No epileptiform activity is noted.   EEG 9/07 >> focal non convulsive status epilepticus from Rt occipital region LP 9/8 RBC 99, WBC 4, glucose 104, protein 81   CSF 9/07 >> negative  Blood 9/10 >> negative  Sputum 9/10 >>Haemophilus influenzae  C diff PCR 9/12 >>negative   Antimicrobials:  Anti-infectives (From admission, onward)   Start     Dose/Rate Route Frequency Ordered Stop   11/02/17 0900  cefTRIAXone (ROCEPHIN) 1 g in sodium chloride 0.9 % 100 mL IVPB  Status:  Discontinued     1 g 200 mL/hr over 30 Minutes Intravenous Every 24 hours 11/02/17 0850 11/07/17 1411   11/01/17 1000  vancomycin (VANCOCIN) 1,750 mg in  sodium chloride 0.9 % 500 mL IVPB  Status:  Discontinued     1,750 mg 250 mL/hr over 120 Minutes Intravenous Every 24 hours 10/31/17 0932 11/02/17 0851   10/31/17 1000  piperacillin-tazobactam (ZOSYN) IVPB 3.375 g  Status:  Discontinued     3.375 g 12.5 mL/hr over 240 Minutes Intravenous Every 8 hours 10/31/17 0928 11/02/17 0850   10/31/17 1000  vancomycin (VANCOCIN) 2,000 mg in sodium chloride 0.9 % 500 mL IVPB     2,000 mg 250 mL/hr over 120 Minutes Intravenous  Once 10/31/17 0932 10/31/17 1334      Subjective: No complaints today.    Objective: Vitals:   11/20/17 0810 11/20/17 0849 11/20/17 1108 11/20/17 1515  BP:  (!) 110/46 (!) 161/70 (!) 153/81  Pulse:  86 72 75  Resp:  20 20 20   Temp:  98.9 F (37.2 C) 98.6 F (37 C) (!) 97.4 F (36.3 C)  TempSrc:  Oral Oral Oral  SpO2: 98% 97% 94% 96%  Weight:      Height:        Intake/Output Summary (Last 24 hours) at 11/20/2017 1642 Last data filed at 11/20/2017 0845 Gross per 24 hour  Intake 400 ml  Output -  Net 400 ml   Filed Weights   11/18/17 0500 11/19/17 0500 11/20/17 0500  Weight: (!) 139.2 kg (!) 137.5 kg 135.7 kg    Examination:  General: No acute distress.  obese Cardiovascular: Heart  sounds show Rafel Garde regular rate, and rhythm Lungs: Clear to auscultation bilaterally with good air movement.  Abdomen: Soft, nontender, nondistended, protuberant Neurological: Alert and oriented 3. Moves all extremities 4. Cranial nerves II through XII grossly intact. Skin: Warm and dry. No rashes or lesions. Extremities: No clubbing or cyanosis. No edema.  Psychiatric: Mood and affect are normal. Insight and judgment are appropriate.   Data Reviewed: I have personally reviewed following labs and imaging studies  CBC: Recent Labs  Lab 11/16/17 0530 11/17/17 0424 11/18/17 0450  WBC 6.2 5.9 5.9  HGB 11.3* 12.0* 11.6*  HCT 36.6* 38.2* 37.0*  MCV 89.9 90.1 88.5  PLT 321 362 063   Basic Metabolic Panel: Recent Labs  Lab  11/15/17 0446 11/16/17 0530 11/17/17 0424 11/18/17 0450 11/19/17 0615 11/20/17 0709  NA 143 141 140 139 139 137  K 3.4* 3.3* 3.9 3.6 3.7 4.5  CL 106 106 103 100 101 103  CO2 28 28 29 30 29 26   GLUCOSE 132* 196* 252* 213* 216* 235*  BUN 8 8 9 8 9 15   CREATININE 1.09 1.10 1.22 1.07 1.12 1.23  CALCIUM 7.9* 8.0* 8.2* 8.4* 8.0* 8.1*  MG 1.7 1.6* 2.1 1.8  --   --   PHOS 3.3 3.8 3.1 3.3 3.9 3.8   GFR: Estimated Creatinine Clearance: 95 mL/min (by C-G formula based on SCr of 1.23 mg/dL). Liver Function Tests: Recent Labs  Lab 11/16/17 0530 11/17/17 0424 11/18/17 0450 11/19/17 0615 11/20/17 0709  ALBUMIN 2.0* 2.3* 2.2* 2.2* 2.3*   No results for input(s): LIPASE, AMYLASE in the last 168 hours. No results for input(s): AMMONIA in the last 168 hours. Coagulation Profile: No results for input(s): INR, PROTIME in the last 168 hours. Cardiac Enzymes: No results for input(s): CKTOTAL, CKMB, CKMBINDEX, TROPONINI in the last 168 hours. BNP (last 3 results) No results for input(s): PROBNP in the last 8760 hours. HbA1C: No results for input(s): HGBA1C in the last 72 hours. CBG: Recent Labs  Lab 11/19/17 1616 11/19/17 2110 11/20/17 0614 11/20/17 1103 11/20/17 1610  GLUCAP 223* 295* 220* 247* 286*   Lipid Profile: No results for input(s): CHOL, HDL, LDLCALC, TRIG, CHOLHDL, LDLDIRECT in the last 72 hours. Thyroid Function Tests: No results for input(s): TSH, T4TOTAL, FREET4, T3FREE, THYROIDAB in the last 72 hours. Anemia Panel: No results for input(s): VITAMINB12, FOLATE, FERRITIN, TIBC, IRON, RETICCTPCT in the last 72 hours. Sepsis Labs: No results for input(s): PROCALCITON, LATICACIDVEN in the last 168 hours.  No results found for this or any previous visit (from the past 240 hour(s)).       Radiology Studies: Dg Chest Port 1 View  Result Date: 11/20/2017 CLINICAL DATA:  PICC placement EXAM: PORTABLE CHEST 1 VIEW COMPARISON:  11/09/2017 FINDINGS: PICC line has been  retracted and now loops in the right upper chest, possibly into the internal jugular vein with the tip in the right brachiocephalic vein. Cardiomegaly. Low lung volumes without confluent opacity or effusion. IMPRESSION: Right PICC line loops in the right upper chest, likely looping in the right internal jugular vein with the tip in the right brachiocephalic vein. Electronically Signed   By: Rolm Baptise M.D.   On: 11/20/2017 10:23        Scheduled Meds: . aspirin  81 mg Oral Daily  . budesonide (PULMICORT) nebulizer solution  0.25 mg Nebulization BID  . chlorhexidine  15 mL Mouth Rinse BID  . feeding supplement (ENSURE ENLIVE)  237 mL Oral BID BM  . heparin  injection (subcutaneous)  5,000 Units Subcutaneous Q8H  . hydrALAZINE  100 mg Oral Q8H  . insulin aspart  0-20 Units Subcutaneous TID WC  . insulin aspart  0-5 Units Subcutaneous QHS  . insulin glargine  15 Units Subcutaneous QHS  . lacosamide  200 mg Oral BID  . levETIRAcetam  1,000 mg Oral BID  . levothyroxine  50 mcg Oral QAC breakfast  . mouth rinse  15 mL Mouth Rinse q12n4p  . metoprolol succinate  50 mg Oral Daily  . pantoprazole  40 mg Oral Daily  . phenytoin  300 mg Oral BID  . sodium chloride flush  10-40 mL Intracatheter Q12H   Continuous Infusions:   LOS: 24 days    Time spent: over 30 min    Fayrene Helper, MD [facility] Pager [phone]  If 7PM-7AM, please contact night-coverage [email or web address] Password [facility] 11/20/2017, 4:42 PM

## 2017-11-20 NOTE — Progress Notes (Signed)
Physical Therapy Treatment Patient Details Name: Daniel [name] MRN: [id number] DOB: 1965/05/16 Today's Date: 11/20/2017    History of Present Illness 52 yo male presented to [facility] with altered mental status, confusion, headaches, hallucinations, and falls. SBP was over 200. In ER at [facility] he was started on esmolol gtt. Developed seizure in [place] at [place]. He continued to have seizures and transferred to [facility]. He was started on burst suppression with versed and required intubation 9/07. Developed fever and increased sputum 9/10.  Intubated 9/10-9/13.      PT Comments    Patient continues to make progress toward PT goals. Pt is able to ambulate short distances this session with min/mod A +2 for safety and use of RW. Current plan remains appropriate.    Follow Up Recommendations  CIR     Equipment Recommendations  Other (comment)(TBD next venue)    Recommendations for Other Services Rehab consult     Precautions / Restrictions Precautions Precautions: Fall    Mobility  Bed Mobility Overal bed mobility: Needs Assistance Bed Mobility: Supine to Sit     Supine to sit: Min guard     General bed mobility comments: increased time and effort; min guard for safety  Transfers Overall transfer level: Needs assistance Equipment used: Rolling walker (2 wheeled)(gait belt) Transfers: Sit to/from Stand Sit to Stand: Min assist;+2 physical assistance         General transfer comment: assist to power up into standing and for balance upon standing from EOB and recliner; cues for safe hand placement  Ambulation/Gait Ambulation/Gait assistance: Mod assist;+2 safety/equipment Gait Distance (Feet): (91ft X2) Assistive device: Rolling walker (2 wheeled) Gait Pattern/deviations: Step-to pattern;Decreased step length - right;Decreased step length - left;Decreased dorsiflexion - right;Decreased dorsiflexion - left;Wide base of support;Trunk flexed Gait velocity:  decreased   General Gait Details: assistance for balance when weight shifting and to guide RW; multimodal cues for hips extension and vc for safe proximity to RW and increased bilat step lengths   Stairs             Wheelchair Mobility    Modified Rankin (Stroke Patients Only)       Balance Overall balance assessment: Needs assistance Sitting-balance support: Feet supported;Bilateral upper extremity supported Sitting balance-Leahy Scale: Fair     Standing balance support: Bilateral upper extremity supported Standing balance-Leahy Scale: Poor                              Cognition Arousal/Alertness: Awake/alert Behavior During Therapy: WFL for tasks assessed/performed Overall Cognitive Status: Impaired/Different from baseline Area of Impairment: Attention;Following commands;Safety/judgement;Problem solving;Memory;Awareness                 Orientation Level: Disoriented to;Time;Place Current Attention Level: Selective Memory: Decreased short-term memory Following Commands: Follows one step commands with increased time;Follows one step commands consistently;Follows multi-step commands inconsistently Safety/Judgement: Decreased awareness of safety;Decreased awareness of deficits Awareness: Emergent Problem Solving: Requires verbal cues        Exercises      General Comments General comments (skin integrity, edema, etc.): wife present       Pertinent Vitals/Pain Pain Assessment: Faces Faces Pain Scale: Hurts little more Pain Location: L ankle  Pain Descriptors / Indicators: Guarding;Sore Pain Intervention(s): Limited activity within patient's tolerance;Monitored during session;Repositioned    Home Living  Prior Function            PT Goals (current goals can now be found in the care plan section) Acute Rehab PT Goals Patient Stated Goal: to get better Progress towards PT goals: Progressing toward goals     Frequency    Min 3X/week      PT Plan Current plan remains appropriate    Co-evaluation              AM-PAC PT "6 Clicks" Daily Activity  Outcome Measure  Difficulty turning over in bed (including adjusting bedclothes, sheets and blankets)?: Unable Difficulty moving from lying on back to sitting on the side of the bed? : Unable Difficulty sitting down on and standing up from a chair with arms (e.g., wheelchair, bedside commode, etc,.)?: Unable Help needed moving to and from a bed to chair (including a wheelchair)?: A Little Help needed walking in hospital room?: A Little Help needed climbing 3-5 steps with a railing? : A Lot 6 Click Score: 11    End of Session Equipment Utilized During Treatment: Gait belt Activity Tolerance: Patient tolerated treatment well Patient left: with call bell/phone within reach;with family/visitor present;in chair Nurse Communication: Mobility status PT Visit Diagnosis: Muscle weakness (generalized) (M62.81)     Time: 3235-5732 PT Time Calculation (min) (ACUTE ONLY): 31 min  Charges:  $Gait Training: 8-22 mins $Therapeutic Activity: 8-22 mins                     Earney Navy, PTA Acute Rehabilitation Services Pager: [phone] Office: [phone]     Darliss Cheney 11/20/2017, 10:27 AM

## 2017-11-20 NOTE — Progress Notes (Signed)
LATE NOTE ENTRY   CSW contacted Admissions at [facility] and Rehab to discuss patient's referral and whether or not he could be admitted for LOG at facility. SNF has declined to offer a bed for patient due to med costs and equipment costs for bariatric equipment. CSW relayed information to patient and his wife, at bedside. CSW sent referral to facilities in [place] to check on availability for LOG placement.  No bed offers at this time. CSW to follow.  Laveda Abbe, [place] Clinical Social Worker [phone]

## 2017-11-20 NOTE — Progress Notes (Signed)
Pt refusing cpap at this time.  RT to DC order after two refusals.

## 2017-11-20 NOTE — Progress Notes (Signed)
Inpatient Diabetes Program Recommendations  AACE/ADA: New Consensus Statement on Inpatient Glycemic Control (2015)  Target Ranges:  Prepandial:   less than 140 mg/dL      Peak postprandial:   less than 180 mg/dL (1-2 hours)      Critically ill patients:  140 - 180 mg/dL   Lab Results  Component Value Date   GLUCAP 247 (H) 11/20/2017   HGBA1C 15.8 (H) 10/28/2017    Review of Glycemic Control Results for Daniel, [name] (MRN [id number]) as of 11/20/2017 13:39  Ref. Range 11/19/2017 11:05 11/19/2017 16:16 11/19/2017 21:10 11/20/2017 06:14 11/20/2017 11:03  Glucose-Capillary Latest Ref Range: 70 - 99 mg/dL 184 (H) 223 (H) 295 (H) 220 (H) 247 (H)    Diabetes history: Type 2 DM  Outpatient Diabetes medications: Levemir 120 units daily, Metformin 500 mg bid Current orders for Inpatient glycemic control:  Novolog resistant tid with meals and HS, Lantus 15 units q HS  Inpatient Diabetes Program Recommendations:    Blood sugars>goal.  Please consider increasing Lantus to 30 units q HS.   Patient was on large doses of insulin at home prior to admit and yet A1C very high?  Unsure if he was actually taking insulin prior to admit?  Thanks,  Adah Perl, RN, BC-ADM Inpatient Diabetes Coordinator Pager [phone] (8a-5p)

## 2017-11-20 NOTE — Progress Notes (Signed)
Occupational Therapy Treatment Patient Details Name: Daniel [name] MRN: [id number] DOB: [birth year] Today's Date: 11/20/2017    History of present illness 52 yo male presented to [facility] with altered mental status, confusion, headaches, hallucinations, and falls. SBP was over 200. In ER at [facility] he was started on esmolol gtt. Developed seizure in [place] at [place]. He continued to have seizures and transferred to [facility]. He was started on burst suppression with versed and required intubation 9/07. Developed fever and increased sputum 9/10.  Intubated 9/10-9/13.     OT comments  PATIENT WAS SITTING IN CHAIR AND REQUESTED TO GO INTO BATHROOM TO TOILET. PATIENT NURSE RECOMMENDED USING SARA LIFT. PATIENT WAS ABLE TO TRANSFER TO SARA WITH MIN A. PATIENT WAS TAKING TO BATHROOM AND SARA PLACED IN FRONT OF COMMODE. PATIENT STATED HE HAD A FEAR OF FALLING AND DID NOT WANT TO ATTEMPT TRANSFER AT THAT TIME. PATIENT STATED HE WANTED TO RETURN TO BED. PATIENT WAS TAKEN TO BEDSIDE WITH USE OF SARA. PATIENT WAS ABLE TO STAND FROM SARA AND STEP DOWN AT MIN A LEVEL. PATIENT WAS ABLE TO PERFORM STEP DOWN FROM SARA AT MIN A LEVEL. PATIENT WAS MIN A WITH SIT TO SUPINE. PATIENT ASKED TO CALL WIFE AND REQUIRED ASSIST TO MAKE CALL AT MIN  A LEVEL. ACUTE OT TO FOLLOW.  Follow Up Recommendations       Equipment Recommendations       Recommendations for Other Services      Precautions / Restrictions Precautions Precautions: Fall Restrictions Weight Bearing Restrictions: No       Mobility Bed Mobility Overal bed mobility: Needs Assistance Bed Mobility: Supine to Sit     Supine to sit: Min assist Sit to supine: Min assist   General bed mobility comments: increased time and effort; min guard for safety  Transfers Overall transfer level: Needs assistance Equipment used: Rolling walker (2 wheeled)(gait belt) Transfers: Sit to/from Stand Sit to Stand: Min assist;+2 physical assistance          General transfer comment: assist to power up into standing and for balance upon standing from EOB and recliner; cues for safe hand placement    Balance Overall balance assessment: Needs assistance Sitting-balance support: Feet supported;Bilateral upper extremity supported Sitting balance-Leahy Scale: Fair     Standing balance support: Bilateral upper extremity supported Standing balance-Leahy Scale: Poor                             ADL either performed or assessed with clinical judgement   ADL       Grooming: Wash/dry hands;Wash/dry face;Set up;Sitting           Upper Body Dressing : Minimal assistance(donning gown to wear as a robe)                   Functional mobility during ADLs: Minimal assistance General ADL Comments: PATIENT WAS MIN A WITH SIT TO STAND FROM CHIAR AND SARA LIFT.      Vision       Perception     Praxis      Cognition Arousal/Alertness: Awake/alert Behavior During Therapy: WFL for tasks assessed/performed Overall Cognitive Status: Impaired/Different from baseline Area of Impairment: Attention;Following commands;Safety/judgement;Problem solving;Memory;Awareness                 Orientation Level: Disoriented to;Time;Place Current Attention Level: Selective Memory: Decreased short-term memory Following Commands: Follows one step commands consistently Safety/Judgement: Decreased awareness of  safety;Decreased awareness of deficits Awareness: Emergent Problem Solving: Requires verbal cues          Exercises     Shoulder Instructions       General Comments wife present     Pertinent Vitals/ Pain       Pain Assessment: No/denies pain Faces Pain Scale: Hurts little more Pain Location: L ankle  Pain Descriptors / Indicators: Guarding;Sore Pain Intervention(s): Limited activity within patient's tolerance;Monitored during session;Repositioned  Home Living                                           Prior Functioning/Environment              Frequency           Progress Toward Goals  OT Goals(current goals can now be found in the care plan section)  Progress towards OT goals: Progressing toward goals  Acute Rehab OT Goals Patient Stated Goal: TO [place] Discharge plan remains appropriate;Frequency remains appropriate    Co-evaluation                 AM-PAC PT "6 Clicks" Daily Activity     Outcome Measure   Help from another person eating meals?: None Help from another person taking care of personal grooming?: A Little Help from another person toileting, which includes using toliet, bedpan, or urinal?: A Lot Help from another person bathing (including washing, rinsing, drying)?: A Lot Help from another person to put on and taking off regular upper body clothing?: A Little Help from another person to put on and taking off regular lower body clothing?: A Lot 6 Click Score: 16    End of Session        Activity Tolerance     Patient Left     Nurse Communication (OK THERAPY)        Time: 3009-2330 OT Time Calculation (min): 29 min  Charges: OT General Charges $OT Visit: 1 Visit OT Treatments $Self Care/Home Management : 8-22 mins $Therapeutic Activity: 0-76 mins 6 CLICKS    Tequilla Cousineau 11/20/2017, 1:54 PM

## 2017-11-20 NOTE — Progress Notes (Signed)
CSW following for discharge plan. Patient remains on DTP list; no bed at this time.  CSW to follow.  Laveda Abbe, [place] Clinical Social Worker [phone]

## 2017-11-21 LAB — GLUCOSE, CAPILLARY
GLUCOSE-CAPILLARY: 189 mg/dL — AB (ref 70–99)
GLUCOSE-CAPILLARY: 197 mg/dL — AB (ref 70–99)
GLUCOSE-CAPILLARY: 194 mg/dL — AB (ref 70–99)
Glucose-Capillary: 333 mg/dL — ABNORMAL HIGH (ref 70–99)

## 2017-11-21 LAB — BASIC METABOLIC PANEL
Anion gap: 8 (ref 5–15)
BUN: 13 mg/dL (ref 6–20)
CALCIUM: 8 mg/dL — AB (ref 8.9–10.3)
CO2: 25 mmol/L (ref 22–32)
Chloride: 103 mmol/L (ref 98–111)
Creatinine, Ser: 1.17 mg/dL (ref 0.61–1.24)
GFR calc Af Amer: >60 mL/min (ref >60)
GLUCOSE: 200 mg/dL — AB (ref 70–99)
Potassium: 3.9 mmol/L (ref 3.5–5.1)
Sodium: 136 mmol/L (ref 135–145)

## 2017-11-21 LAB — BLOOD GAS, VENOUS
Acid-Base Excess: 8.9 mmol/L — ABNORMAL HIGH (ref 0.0–2.0)
BICARBONATE: 33.6 mmol/L — AB (ref 20.0–28.0)
O2 SAT: 59.7 %
PCO2 VEN: 53 mmHg (ref 44.0–60.0)
PH VEN: 7.419 (ref 7.250–7.430)
PO2 VEN: 32.7 mmHg (ref 32.0–45.0)
Patient temperature: 98.6

## 2017-11-21 LAB — CBC
HCT: 40.9 % (ref 39.0–52.0)
Hemoglobin: 12.7 g/dL — ABNORMAL LOW (ref 13.0–17.0)
MCH: 27.7 pg (ref 26.0–34.0)
MCHC: 31.1 g/dL (ref 30.0–36.0)
MCV: 89.1 fL (ref 78.0–100.0)
Platelets: 225 10*3/uL (ref 150–400)
RBC: 4.59 MIL/uL (ref 4.22–5.81)
RDW: 15.4 % (ref 11.5–15.5)
WBC: 5.1 10*3/uL (ref 4.0–10.5)

## 2017-11-21 LAB — HEPATIC FUNCTION PANEL
ALK PHOS: 73 U/L (ref 38–126)
ALT: 16 U/L (ref 0–44)
AST: 16 U/L (ref 15–41)
Albumin: 2.5 g/dL — ABNORMAL LOW (ref 3.5–5.0)
Bilirubin, Direct: 0.2 mg/dL (ref 0.0–0.2)
Indirect Bilirubin: 0.2 mg/dL — ABNORMAL LOW (ref 0.3–0.9)
TOTAL PROTEIN: 6.1 g/dL — AB (ref 6.5–8.1)
Total Bilirubin: 0.4 mg/dL (ref 0.3–1.2)

## 2017-11-21 LAB — TSH: TSH: 2.098 u[IU]/mL (ref 0.350–4.500)

## 2017-11-21 LAB — AMMONIA: Ammonia: 33 umol/L (ref 9–35)

## 2017-11-21 LAB — PHENYTOIN LEVEL, TOTAL: PHENYTOIN LVL: 3 ug/mL — AB (ref 10.0–20.0)

## 2017-11-21 MED ORDER — PHENYTOIN SODIUM EXTENDED 100 MG PO CAPS
300.0000 mg | ORAL_CAPSULE | Freq: Every day | ORAL | Status: DC
  Administered 2017-11-21: 300 mg via ORAL
  Filled 2017-11-21: qty 3

## 2017-11-21 MED ORDER — PHENYTOIN SODIUM EXTENDED 100 MG PO CAPS
400.0000 mg | ORAL_CAPSULE | Freq: Every morning | ORAL | Status: DC
  Administered 2017-11-21 – 2017-11-22 (×2): 400 mg via ORAL
  Filled 2017-11-21 (×2): qty 4

## 2017-11-21 NOTE — Progress Notes (Signed)
PROGRESS NOTE    ASHRAF MESTA  ZES:[id number] DOB: 05-07-1965 DOA: 10/27/2017 PCP: Antonietta Jewel, MD   Brief Narrative:  Mandela Bello Daltonis Amelianna Meller 52 y.o.malewith Jamail Cullers history of seizure disorder, IDDM, OSA, OHS, morbid obesity, HTN, IDDM, and PE who presented to [facility] with confusion, headache, hallucinations, and falls found to have hypertensive urgency, started on esmolol gtt, and developed seizures in the ED. He was admitted to [facility] and intubated 9/7. Developed increasing secretions found to have Hemophilus pneumonia, treated with antibiotics, ultimately extubated 9/13. Has had an episode of diminished responsiveness for which neurology was reconsulted and reloaded dilantin due to subtherapeutic level. EEG repeated, showing generalized slowing. Nephrology was consulted for AKI and hypernatremia which are improving with IV fluids. Mental status has overall improved with bouts of delirium. Per oral intake is slowly improvingwith concomitant improvement in mentation.  Currently pending placement.  Potentially CIR.  Assessment & Plan:   Principal Problem:   Recurrent seizures ([place]) Active Problems:   Sleep apnea   Obesity hypoventilation syndrome (HCC)   Hypertension   Status epilepticus (HCC)   Fever   OSA (obstructive sleep apnea)   Noncompliance   FUO (fever of unknown origin)   AKI (acute kidney injury) ([place])   Diabetes mellitus type 2 in nonobese ([place])   Diabetes mellitus type 2 in obese ([place])   Acute blood loss anemia   Labile blood pressure   Status epilepticus, thought to be hyperglycemia-induced:  - Continue AEDs per neurology: vimpat, dilantin, keppra.  -Patient remains stable.  No further seizure activity. - continue to monitor dilantin per pharmacy (phenytoin level still low today)  Acute Encephalopathy: pt seemed drowsy this morning and Emran Molzahn little confused.  On discussion with his wife, she told me that he said he had Derion Kreiter hard time sleeping last night.  She notes he's often confused  when first waking up.  I think this is likely due to his fatigue and trouble sleeping. Follow VBG, TSH, ammonial, HFP.  Continue to monitor  Hypernatremia:  Resolved with IVF and po intake. Continue to monitor periodically.  IDDM:  Uncontrolled with hyperglycemia. HbA1c 15.8%.  Improved AM BG, continue to monitor for now.   -Lantus dose was adjusted (increased to 20 units).  Add mealtime insulin.  He is also on SSI.  Of note, was on much higher dose of insulin at home, continue to follow for adjustment.  AKI on stage III CKD:  Partially due to urinary retention, hypovolemia. Nephrology was consulted, started IVFs, holding diuretic and ACE.  Renal function is now back to baseline. SPEP without M-spike; K/L LC ratio normal at 1.36.Based on current creatinine, patient does not have chronic kidney disease. - Nephrology signed off 9/20.  -We will need outpatient follow-up by PCP for proteinuria and hematuria noted on UA.  Possibly due to diabetic nephropathy.    Hypoalbuminemia,  Suspect malnutrition despite morbid obesity:  - Dietitian consulted; start supplementation with magic cup TIDWC and ensure enlive po BID thickened.   Peripheral edema:  Mostly due to inactivity, hypoalbuminemia (malnutrition and proteinuria contributing), peripheral IV's/SVT.  - Mostly upper extremities, not dependent, so will hold on diuretic. No evidence of pulmonary edema.  Hypokalemia  Hypomagnesemia Electrolytes have been stable.  Acute encephalopathy, acute delirium:  Most likely due to seizures.  Now back to baseline.  Dysphagia:  Hopeful for improvement with his improved level or alertness. - Continue working with SLP. Continue dysphagia diet and thickened liquids (family educated). - S/p FEES with SLP on  9/25 - recommended regular diet and thin liquids, no SLP follow up - consider GI follow up with concern for primary esophageal issue per speech  Poor IV access:  PICC placed 9/19 RUE, but  malpositioned today -> will remove PICC and place PIV.  Will see if he does well without PICC, consider replacing if difficulty with access or blood draws.  Acute on chronic hypoxic respiratory failure:  Ongoing poor respiratory status due to OSA, OHS, morbid obesity, obstructive lung disease, CAP. Has been on O2 since 2018 admission for pulmonary infiltrates, possible pneumonia. - Completed antibiotics 9/17.  - CPAP qHS when able - Continue supplemental oxygen   Fever:  Afebrile since 9/17 off abx. Cortisol, TSH wnl. ID consulted, feel this is more likely drug-related or central fever.   Hypertensive emergency:  Improved.  - Continue BP medications.   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  Continue home medications.  Hypothyroidism:  TSH 1.415 - Continue synthroid.  Diarrhea:  Has resolved, not Gracianna Vink cause of hypokalemia currently. - Negative CDiff  Left cephalic vein SVT:  - Warm compresses. No DVT.  Pruritus Stable.  No rash was identified.    DVT prophylaxis: heparin Code Status: full Family Communication: wife at bedside Disposition Plan: pending placement.  Potentially CIR.  Consultants:   Neurology  PCCM  ID  Nephrology  Rehab  Procedures:  Upper extremity venous Doppler studies Right: No evidence of thrombosis in the subclavian.  Left: No evidence of deep vein thrombosis in the upper extremity. Findings consistent with age indeterminate superficial vein thrombosis involving the left cephalic vein.  EEG 9/17 IMPRESSION: This EEG is characterized by slowing which is consistent with normal drowse. Can not rule out the possibility of slowing related to general cerebral disturbance such as Deauna Yaw metabolic encephalopathy. Clinical correlation recommended. No epileptiform activity is noted.   EEG 9/07 >> focal non convulsive status epilepticus from Rt occipital region LP 9/8 RBC 99, WBC 4, glucose 104, protein 81   CSF 9/07 >>  negative  Blood 9/10 >> negative  Sputum 9/10 >>Haemophilus influenzae  C diff PCR 9/12 >>negative   Antimicrobials:  Anti-infectives (From admission, onward)   Start     Dose/Rate Route Frequency Ordered Stop   11/02/17 0900  cefTRIAXone (ROCEPHIN) 1 g in sodium chloride 0.9 % 100 mL IVPB  Status:  Discontinued     1 g 200 mL/hr over 30 Minutes Intravenous Every 24 hours 11/02/17 0850 11/07/17 1411   11/01/17 1000  vancomycin (VANCOCIN) 1,750 mg in sodium chloride 0.9 % 500 mL IVPB  Status:  Discontinued     1,750 mg 250 mL/hr over 120 Minutes Intravenous Every 24 hours 10/31/17 0932 11/02/17 0851   10/31/17 1000  piperacillin-tazobactam (ZOSYN) IVPB 3.375 g  Status:  Discontinued     3.375 g 12.5 mL/hr over 240 Minutes Intravenous Every 8 hours 10/31/17 0928 11/02/17 0850   10/31/17 1000  vancomycin (VANCOCIN) 2,000 mg in sodium chloride 0.9 % 500 mL IVPB     2,000 mg 250 mL/hr over 120 Minutes Intravenous  Once 10/31/17 0932 10/31/17 1334      Subjective: Very sleepy this morning and slightly confused. Wife notes he had hard time sleeping last night.  Objective: Vitals:   11/21/17 0809 11/21/17 1118 11/21/17 1515 11/21/17 2013  BP: (!) 151/70 (!) 141/64 (!) 117/46 126/68  Pulse: 83 81 75 74  Resp: 20 16 20 18   Temp:   98.4 F (36.9 C) 98.2 F (36.8 C)  TempSrc:   Oral Oral  SpO2: 98% 95% 97% 96%  Weight:      Height:        Intake/Output Summary (Last 24 hours) at 11/21/2017 2109 Last data filed at 11/21/2017 1424 Gross per 24 hour  Intake -  Output 700 ml  Net -700 ml   Filed Weights   11/19/17 0500 11/20/17 0500 11/21/17 0600  Weight: (!) 137.5 kg 135.7 kg 134.4 kg    Examination:  General: No acute distress. Cardiovascular: Heart sounds show Alexsa Flaum regular rate, and rhythm.  Lungs: Clear to auscultation bilaterally  Abdomen: Soft, nontender, nondistended Neurological: Drowsy. Moves all extremities 4. Cranial nerves II through XII intact. Skin: Warm  and dry. No rashes or lesions. Extremities: No clubbing or cyanosis. No edema. Pedal pulses 2+. Psychiatric: Mood and affect are normal. Insight and judgment are appropriate.   Data Reviewed: I have personally reviewed following labs and imaging studies  CBC: Recent Labs  Lab 11/16/17 0530 11/17/17 0424 11/18/17 0450 11/21/17 0805  WBC 6.2 5.9 5.9 5.1  HGB 11.3* 12.0* 11.6* 12.7*  HCT 36.6* 38.2* 37.0* 40.9  MCV 89.9 90.1 88.5 89.1  PLT 321 362 359 295   Basic Metabolic Panel: Recent Labs  Lab 11/15/17 0446 11/16/17 0530 11/17/17 0424 11/18/17 0450 11/19/17 0615 11/20/17 0709 11/21/17 0805  NA 143 141 140 139 139 137 136  K 3.4* 3.3* 3.9 3.6 3.7 4.5 3.9  CL 106 106 103 100 101 103 103  CO2 28 28 29 30 29 26 25   GLUCOSE 132* 196* 252* 213* 216* 235* 200*  BUN 8 8 9 8 9 15 13   CREATININE 1.09 1.10 1.22 1.07 1.12 1.23 1.17  CALCIUM 7.9* 8.0* 8.2* 8.4* 8.0* 8.1* 8.0*  MG 1.7 1.6* 2.1 1.8  --   --   --   PHOS 3.3 3.8 3.1 3.3 3.9 3.8  --    GFR: Estimated Creatinine Clearance: 99.4 mL/min (by C-G formula based on SCr of 1.17 mg/dL). Liver Function Tests: Recent Labs  Lab 11/17/17 0424 11/18/17 0450 11/19/17 0615 11/20/17 0709 11/21/17 1242  AST  --   --   --   --  16  ALT  --   --   --   --  16  ALKPHOS  --   --   --   --  73  BILITOT  --   --   --   --  0.4  PROT  --   --   --   --  6.1*  ALBUMIN 2.3* 2.2* 2.2* 2.3* 2.5*   No results for input(s): LIPASE, AMYLASE in the last 168 hours. Recent Labs  Lab 11/21/17 1242  AMMONIA 33   Coagulation Profile: No results for input(s): INR, PROTIME in the last 168 hours. Cardiac Enzymes: No results for input(s): CKTOTAL, CKMB, CKMBINDEX, TROPONINI in the last 168 hours. BNP (last 3 results) No results for input(s): PROBNP in the last 8760 hours. HbA1C: No results for input(s): HGBA1C in the last 72 hours. CBG: Recent Labs  Lab 11/20/17 1610 11/20/17 2230 11/21/17 0700 11/21/17 1117 11/21/17 1646  GLUCAP  286* 240* 189* 197* 333*   Lipid Profile: No results for input(s): CHOL, HDL, LDLCALC, TRIG, CHOLHDL, LDLDIRECT in the last 72 hours. Thyroid Function Tests: Recent Labs    11/21/17 1242  TSH 2.098   Anemia Panel: No results for input(s): VITAMINB12, FOLATE, FERRITIN, TIBC, IRON, RETICCTPCT in the last 72 hours. Sepsis Labs: No results for input(s): PROCALCITON, LATICACIDVEN  in the last 168 hours.  No results found for this or any previous visit (from the past 240 hour(s)).       Radiology Studies: Dg Chest Port 1 View  Result Date: 11/20/2017 CLINICAL DATA:  PICC placement EXAM: PORTABLE CHEST 1 VIEW COMPARISON:  11/09/2017 FINDINGS: PICC line has been retracted and now loops in the right upper chest, possibly into the internal jugular vein with the tip in the right brachiocephalic vein. Cardiomegaly. Low lung volumes without confluent opacity or effusion. IMPRESSION: Right PICC line loops in the right upper chest, likely looping in the right internal jugular vein with the tip in the right brachiocephalic vein. Electronically Signed   By: Rolm Baptise M.D.   On: 11/20/2017 10:23        Scheduled Meds: . aspirin  81 mg Oral Daily  . budesonide (PULMICORT) nebulizer solution  0.25 mg Nebulization BID  . chlorhexidine  15 mL Mouth Rinse BID  . feeding supplement (ENSURE ENLIVE)  237 mL Oral BID BM  . heparin injection (subcutaneous)  5,000 Units Subcutaneous Q8H  . hydrALAZINE  100 mg Oral Q8H  . insulin aspart  0-20 Units Subcutaneous TID WC  . insulin aspart  0-5 Units Subcutaneous QHS  . insulin aspart  4 Units Subcutaneous TID WC  . insulin glargine  20 Units Subcutaneous QHS  . lacosamide  200 mg Oral BID  . levETIRAcetam  1,000 mg Oral BID  . levothyroxine  50 mcg Oral QAC breakfast  . mouth rinse  15 mL Mouth Rinse q12n4p  . metoprolol succinate  50 mg Oral Daily  . pantoprazole  40 mg Oral Daily  . phenytoin  300 mg Oral QHS  . phenytoin  400 mg Oral q morning -  10a  . sodium chloride flush  10-40 mL Intracatheter Q12H   Continuous Infusions:   LOS: 25 days    Time spent: over 30 min    Fayrene Helper, MD [facility] Pager [phone]  If 7PM-7AM, please contact night-coverage [email or web address] Password [email or web address] 11/21/2017, 9:09 PM

## 2017-11-21 NOTE — Progress Notes (Signed)
Inpatient Rehabilitation Admissions Coordinator  I met with patient and his wife at bedside to discuss goals and expectations of an inpt rehab admission. I explained that we do take patients without insurance, but if his medicaid is not approved, he would get a [place] for the rehab. Wife states they applied with Medicaid at [facility] prior to admission here. His Medicaid worker is Leigh Casaus at [phone]. I have left a message for his Medicaid worker to request an update on the status. Patient and his wife would like to be considered for admit to [facility] CIR. I will follow up tomorrow for possible admit after they discuss overnight.  Danne Baxter, RN, MSN Rehab Admissions Coordinator [phone] 11/21/2017 5:06 PM

## 2017-11-21 NOTE — Progress Notes (Signed)
MEDICATION RELATED CONSULT NOTE - Follow-up  Pharmacy Consult for Phenytoin Indication: Status epilepticus / seizures  Allergies  Allergen Reactions  . Montelukast Other (See Comments)    Possibly cause headaches per spouse    Patient Measurements: Height: 5\' 9"  (175.3 cm) Weight: 296 lb 4.8 oz (134.4 kg) IBW/kg (Calculated) : 70.7  Vital Signs: Temp: 98 F (36.7 C) (10/01 0336) Temp Source: Oral (10/01 0336) BP: 151/70 (10/01 0809) Pulse Rate: 83 (10/01 0809)  Labs: Recent Labs    11/19/17 0615 11/20/17 0709 11/21/17 0805  WBC  --   --  5.1  HGB  --   --  12.7*  HCT  --   --  40.9  PLT  --   --  225  CREATININE 1.12 1.23 1.17  PHOS 3.9 3.8  --   ALBUMIN 2.2* 2.3*  --    Estimated Creatinine Clearance: 99.4 mL/min (by C-G formula based on SCr of 1.17 mg/dL).  Assessment: 52 yo M presented with status epilepticus. Started on several anti-seizure medications.  Seizure medications switched to po 9/25 to prepare for discharge  Phenytoin level 6 days s/p adjustment (to 300mg  PO BID) subtherapeutic at a corrected level of 5.4 for low albumin.  Remains stable without seizure activity.    Goal of Therapy:  Total Phenytoin level 10-20 mcg/ml Prevention of seizures  Plan:  Increase phenytoin to 400mg  PO qAM, 300mg  PO qPM Continue Vimpat + Keppra   F/u phenytoin level in 5-7 days  Bertis Ruddy, PharmD Clinical Pharmacist Please check AMION for all [place] numbers 11/21/2017 9:19 AM

## 2017-11-21 NOTE — Progress Notes (Signed)
Inpatient Diabetes Program Recommendations  AACE/ADA: New Consensus Statement on Inpatient Glycemic Control (2015)  Target Ranges:  Prepandial:   less than 140 mg/dL      Peak postprandial:   less than 180 mg/dL (1-2 hours)      Critically ill patients:  140 - 180 mg/dL   Lab Results  Component Value Date   GLUCAP 197 (H) 11/21/2017   HGBA1C 15.8 (H) 10/28/2017    Review of Glycemic Control Results for Daniel [name], Daniel [name] (MRN [id number]) as of 11/21/2017 15:17  Ref. Range 11/20/2017 16:10 11/20/2017 22:30 11/21/2017 07:00 11/21/2017 11:17  Glucose-Capillary Latest Ref Range: 70 - 99 mg/dL 286 (H) 240 (H) 189 (H) 197 (H)   Diabetes history: Type 2 DM  Outpatient Diabetes medications: Levemir 120 units daily, Metformin 500 mg bid Current orders for Inpatient glycemic control:  Novolog resistant tid with meals and HS, Lantus 20 units q HS, Novolog 4 units TID, Novolog 0-5 units QHS  Inpatient Diabetes Program Recommendations:    Spoke with patient regarding outpatient management of diabetes. Patient admits to not taking insulin as prescribed due to cost. It is still unclear for how long this has been going on, as patient still having periods of confusion and has no family at the bedside. However, patient states, "I just assumed that I am going to die like all my family members that had diabetes."  Reviewed patient's current A1c of 15.8%. Explained what a A1c is and what it measures. Also reviewed goal A1c with patient, importance of good glucose control @ home, and blood sugar goals. Discussed patho of DM, need for insulin, using food as fuel, vascular changes that occur from diabetes, comorbidites associated from poor control and carb counting.  Patient has a meter and supplies and was checking blood sugars occasionally. He was checking more often when he was taking his insulin.  Patient will need close outpatient follow up to improve glycemic control with PCP and to have someone helping him with  injections (patient confirms that [place], wife will help). In preparation for discharge, recommend Novolin 70/30 BID. Patient states that this is an affordable option. Reviewed Relion products.  Will continue to follow for disposition plan.   Thanks, Bronson Curb, MSN, RNC-OB Diabetes Coordinator [phone] (8a-5p)

## 2017-11-21 NOTE — Progress Notes (Signed)
Nutrition Follow-up  DOCUMENTATION CODES:   Morbid obesity  INTERVENTION:  Continue Ensure Enlive po BID, each supplement provides 350 kcal and 20 grams of protein.  Encourage adequate PO intake.  NUTRITION DIAGNOSIS:   Inadequate oral intake related to inability to eat as evidenced by NPO status; diet advanced; improved  GOAL:   Patient will meet greater than or equal to 90% of their needs; met  MONITOR:   PO intake, Labs, Weight trends, I & O's, Skin, Supplement acceptance  REASON FOR ASSESSMENT:   Consult Enteral/tube feeding initiation and management  ASSESSMENT:   52 yo male with PMH of IDDM, HTN, OSA, OHS, and seizures who was admitted to [facility] on 9/6 with AMS. Had multiple seizures and was transferred to [facility]; required intubation 9/7. Extubated 9/13. Cortrak NGT removed 9/17.   Meal completion has been mostly 50-100%. Intake has improved. Pt currently has Ensure ordered with varied consumption. RD to continue with current orders to aid in caloric and protein needs. Pt to undergo CIR evaluation. Labs and medications reviewed.   Diet Order:   Diet Order            Diet regular Room service appropriate? Yes; Fluid consistency: Thin  Diet effective now              EDUCATION NEEDS:   No education needs have been identified at this time  Skin:  Skin Assessment: Reviewed RN Assessment  Last BM:  9/28  Height:   Ht Readings from Last 1 Encounters:  10/28/17 5' 9" (1.753 m)    Weight:   Wt Readings from Last 1 Encounters:  11/21/17 134.4 kg    Ideal Body Weight:  72.7 kg  BMI:  Body mass index is 43.76 kg/m.  Estimated Nutritional Needs:   Kcal:  2000-2200  Protein:  110-120 grams  Fluid:  2- 2.2 L/day    Corrin Parker, MS, RD, LDN Pager # [phone] After hours/ weekend pager # [phone]

## 2017-11-21 NOTE — Progress Notes (Signed)
Inpatient Rehabilitation Admissions Coordinator  I met with patient at bedside as he was sleeping. I awoke him to try to discuss his rehab needs. Fell back asleep. I awoke him again and he asked me to come back when his wife arrives. I contacted pt's wife by phone and left a message for her to call me to discuss his rehab options.  Danne Baxter, RN, MSN Rehab Admissions Coordinator [phone] 11/21/2017 10:52 AM

## 2017-11-22 ENCOUNTER — Encounter (HOSPITAL_COMMUNITY): Payer: Self-pay | Admitting: Physical Medicine and Rehabilitation

## 2017-11-22 ENCOUNTER — Inpatient Hospital Stay (HOSPITAL_COMMUNITY)
Admission: RE | Admit: 2017-11-22 | Discharge: 2017-12-02 | DRG: 945 | Disposition: A | Discharge status: Home / Self Care | Payer: Medicaid Other | Source: Intra-hospital | Attending: Physical Medicine & Rehabilitation | Admitting: Physical Medicine & Rehabilitation

## 2017-11-22 ENCOUNTER — Encounter (HOSPITAL_COMMUNITY): Payer: Self-pay | Admitting: Emergency Medicine

## 2017-11-22 ENCOUNTER — Other Ambulatory Visit: Payer: Self-pay

## 2017-11-22 DIAGNOSIS — I10 Essential (primary) hypertension: Secondary | ICD-10-CM

## 2017-11-22 DIAGNOSIS — Z87891 Personal history of nicotine dependence: Secondary | ICD-10-CM | POA: Diagnosis not present

## 2017-11-22 DIAGNOSIS — Z7982 Long term (current) use of aspirin: Secondary | ICD-10-CM | POA: Diagnosis not present

## 2017-11-22 DIAGNOSIS — Z79890 Hormone replacement therapy (postmenopausal): Secondary | ICD-10-CM

## 2017-11-22 DIAGNOSIS — Z794 Long term (current) use of insulin: Secondary | ICD-10-CM

## 2017-11-22 DIAGNOSIS — Z86711 Personal history of pulmonary embolism: Secondary | ICD-10-CM

## 2017-11-22 DIAGNOSIS — I129 Hypertensive chronic kidney disease with stage 1 through stage 4 chronic kidney disease, or unspecified chronic kidney disease: Secondary | ICD-10-CM | POA: Diagnosis present

## 2017-11-22 DIAGNOSIS — Z8673 Personal history of transient ischemic attack (TIA), and cerebral infarction without residual deficits: Secondary | ICD-10-CM

## 2017-11-22 DIAGNOSIS — E1122 Type 2 diabetes mellitus with diabetic chronic kidney disease: Secondary | ICD-10-CM | POA: Diagnosis present

## 2017-11-22 DIAGNOSIS — R5381 Other malaise: Principal | ICD-10-CM

## 2017-11-22 DIAGNOSIS — R569 Unspecified convulsions: Secondary | ICD-10-CM

## 2017-11-22 DIAGNOSIS — E669 Obesity, unspecified: Secondary | ICD-10-CM

## 2017-11-22 DIAGNOSIS — E1169 Type 2 diabetes mellitus with other specified complication: Secondary | ICD-10-CM

## 2017-11-22 DIAGNOSIS — R609 Edema, unspecified: Secondary | ICD-10-CM | POA: Diagnosis not present

## 2017-11-22 DIAGNOSIS — N179 Acute kidney failure, unspecified: Secondary | ICD-10-CM | POA: Diagnosis present

## 2017-11-22 DIAGNOSIS — E7800 Pure hypercholesterolemia, unspecified: Secondary | ICD-10-CM | POA: Diagnosis present

## 2017-11-22 DIAGNOSIS — R600 Localized edema: Secondary | ICD-10-CM

## 2017-11-22 DIAGNOSIS — Z9981 Dependence on supplemental oxygen: Secondary | ICD-10-CM

## 2017-11-22 DIAGNOSIS — N183 Chronic kidney disease, stage 3 (moderate): Secondary | ICD-10-CM | POA: Diagnosis present

## 2017-11-22 DIAGNOSIS — D62 Acute posthemorrhagic anemia: Secondary | ICD-10-CM | POA: Diagnosis present

## 2017-11-22 DIAGNOSIS — G40901 Epilepsy, unspecified, not intractable, with status epilepticus: Secondary | ICD-10-CM | POA: Diagnosis present

## 2017-11-22 DIAGNOSIS — E662 Morbid (severe) obesity with alveolar hypoventilation: Secondary | ICD-10-CM | POA: Diagnosis present

## 2017-11-22 DIAGNOSIS — E039 Hypothyroidism, unspecified: Secondary | ICD-10-CM | POA: Diagnosis present

## 2017-11-22 DIAGNOSIS — E66813 Obesity, class 3: Secondary | ICD-10-CM

## 2017-11-22 DIAGNOSIS — E8809 Other disorders of plasma-protein metabolism, not elsewhere classified: Secondary | ICD-10-CM | POA: Diagnosis present

## 2017-11-22 DIAGNOSIS — E8779 Other fluid overload: Secondary | ICD-10-CM | POA: Diagnosis not present

## 2017-11-22 DIAGNOSIS — G479 Sleep disorder, unspecified: Secondary | ICD-10-CM | POA: Diagnosis present

## 2017-11-22 DIAGNOSIS — N182 Chronic kidney disease, stage 2 (mild): Secondary | ICD-10-CM

## 2017-11-22 DIAGNOSIS — Z7951 Long term (current) use of inhaled steroids: Secondary | ICD-10-CM

## 2017-11-22 DIAGNOSIS — Z8249 Family history of ischemic heart disease and other diseases of the circulatory system: Secondary | ICD-10-CM

## 2017-11-22 DIAGNOSIS — Z6841 Body Mass Index (BMI) 40.0 and over, adult: Secondary | ICD-10-CM | POA: Diagnosis not present

## 2017-11-22 DIAGNOSIS — E871 Hypo-osmolality and hyponatremia: Secondary | ICD-10-CM | POA: Diagnosis present

## 2017-11-22 DIAGNOSIS — K5900 Constipation, unspecified: Secondary | ICD-10-CM | POA: Diagnosis not present

## 2017-11-22 DIAGNOSIS — E8770 Fluid overload, unspecified: Secondary | ICD-10-CM

## 2017-11-22 DIAGNOSIS — E46 Unspecified protein-calorie malnutrition: Secondary | ICD-10-CM | POA: Diagnosis present

## 2017-11-22 DIAGNOSIS — R7309 Other abnormal glucose: Secondary | ICD-10-CM | POA: Diagnosis not present

## 2017-11-22 DIAGNOSIS — Z833 Family history of diabetes mellitus: Secondary | ICD-10-CM

## 2017-11-22 LAB — BASIC METABOLIC PANEL
ANION GAP: 8 (ref 5–15)
BUN: 12 mg/dL (ref 6–20)
CALCIUM: 8.5 mg/dL — AB (ref 8.9–10.3)
CO2: 30 mmol/L (ref 22–32)
Chloride: 99 mmol/L (ref 98–111)
Creatinine, Ser: 1.22 mg/dL (ref 0.61–1.24)
GFR calc Af Amer: >60 mL/min (ref >60)
GFR calc non Af Amer: >60 mL/min (ref >60)
GLUCOSE: 220 mg/dL — AB (ref 70–99)
POTASSIUM: 3.9 mmol/L (ref 3.5–5.1)
Sodium: 137 mmol/L (ref 135–145)

## 2017-11-22 LAB — GLUCOSE, CAPILLARY
GLUCOSE-CAPILLARY: 241 mg/dL — AB (ref 70–99)
GLUCOSE-CAPILLARY: 308 mg/dL — AB (ref 70–99)
Glucose-Capillary: 173 mg/dL — ABNORMAL HIGH (ref 70–99)
Glucose-Capillary: 252 mg/dL — ABNORMAL HIGH (ref 70–99)

## 2017-11-22 LAB — CBC
HEMATOCRIT: 40.3 % (ref 39.0–52.0)
Hemoglobin: 12.8 g/dL — ABNORMAL LOW (ref 13.0–17.0)
MCH: 28.3 pg (ref 26.0–34.0)
MCHC: 31.8 g/dL (ref 30.0–36.0)
MCV: 89.2 fL (ref 78.0–100.0)
Platelets: 312 10*3/uL (ref 150–400)
RBC: 4.52 MIL/uL (ref 4.22–5.81)
RDW: 15.3 % (ref 11.5–15.5)
WBC: 6.3 10*3/uL (ref 4.0–10.5)

## 2017-11-22 LAB — MAGNESIUM: Magnesium: 1.9 mg/dL (ref 1.7–2.4)

## 2017-11-22 MED ORDER — METOPROLOL SUCCINATE ER 50 MG PO TB24: 50.0000 mg | ORAL_TABLET | Freq: Every day | ORAL | Status: DC

## 2017-11-22 MED ORDER — INSULIN ASPART 100 UNIT/ML ~~LOC~~ SOLN
0.0000 [IU] | Freq: Every day | SUBCUTANEOUS | Status: DC
  Administered 2017-11-22: 4 [IU] via SUBCUTANEOUS
  Administered 2017-11-23 – 2017-11-26 (×2): 3 [IU] via SUBCUTANEOUS
  Administered 2017-11-28 – 2017-11-30 (×2): 2 [IU] via SUBCUTANEOUS

## 2017-11-22 MED ORDER — DIPHENHYDRAMINE HCL 12.5 MG/5ML PO ELIX
12.5000 mg | ORAL_SOLUTION | Freq: Four times a day (QID) | ORAL | Status: DC | PRN
  Administered 2017-11-25: 12.5 mg via ORAL
  Filled 2017-11-22: qty 10

## 2017-11-22 MED ORDER — ENOXAPARIN SODIUM 40 MG/0.4ML ~~LOC~~ SOLN
40.0000 mg | SUBCUTANEOUS | Status: DC
  Administered 2017-11-22 – 2017-12-01 (×9): 40 mg via SUBCUTANEOUS
  Filled 2017-11-22 (×10): qty 0.4

## 2017-11-22 MED ORDER — PHENYTOIN SODIUM EXTENDED 100 MG PO CAPS: 300.0000 mg | ORAL_CAPSULE | Freq: Every day | ORAL | Status: DC

## 2017-11-22 MED ORDER — BUDESONIDE 0.25 MG/2ML IN SUSP
0.2500 mg | Freq: Two times a day (BID) | RESPIRATORY_TRACT | Status: DC
  Administered 2017-11-23 – 2017-11-28 (×8): 0.25 mg via RESPIRATORY_TRACT
  Filled 2017-11-22 (×15): qty 2

## 2017-11-22 MED ORDER — INSULIN GLARGINE 100 UNIT/ML ~~LOC~~ SOLN: 20.0000 [IU] | Freq: Every day | SUBCUTANEOUS | Status: DC

## 2017-11-22 MED ORDER — PROCHLORPERAZINE MALEATE 5 MG PO TABS: 5.0000 mg | ORAL_TABLET | Freq: Four times a day (QID) | ORAL | Status: DC | PRN

## 2017-11-22 MED ORDER — ACETAMINOPHEN 325 MG PO TABS: 325.0000 mg | ORAL_TABLET | ORAL | Status: DC | PRN

## 2017-11-22 MED ORDER — POLYETHYLENE GLYCOL 3350 17 G PO PACK
17.0000 g | PACK | Freq: Every day | ORAL | Status: DC | PRN
  Administered 2017-11-26 – 2017-11-27 (×2): 17 g via ORAL
  Filled 2017-11-22 (×3): qty 1

## 2017-11-22 MED ORDER — FLUTICASONE FUROATE-VILANTEROL 200-25 MCG/INH IN AEPB
1.0000 | INHALATION_SPRAY | Freq: Every day | RESPIRATORY_TRACT | Status: DC
  Administered 2017-11-23 – 2017-12-01 (×9): 1 via RESPIRATORY_TRACT
  Filled 2017-11-22: qty 28

## 2017-11-22 MED ORDER — HYDRALAZINE HCL 20 MG/ML IJ SOLN: 10.0000 mg | INTRAMUSCULAR | Status: DC | PRN

## 2017-11-22 MED ORDER — TRAZODONE HCL 50 MG PO TABS
25.0000 mg | ORAL_TABLET | Freq: Every evening | ORAL | Status: DC | PRN
  Administered 2017-11-22 – 2017-11-27 (×4): 50 mg via ORAL
  Filled 2017-11-22 (×5): qty 1

## 2017-11-22 MED ORDER — INSULIN ASPART 100 UNIT/ML ~~LOC~~ SOLN: 0.0000 [IU] | Freq: Every day | SUBCUTANEOUS | Status: DC

## 2017-11-22 MED ORDER — ACETAMINOPHEN 325 MG PO TABS
325.0000 mg | ORAL_TABLET | ORAL | Status: DC | PRN
  Administered 2017-11-22 – 2017-12-01 (×5): 650 mg via ORAL
  Filled 2017-11-22 (×6): qty 2

## 2017-11-22 MED ORDER — INSULIN ASPART 100 UNIT/ML ~~LOC~~ SOLN: 0.0000 [IU] | Freq: Three times a day (TID) | SUBCUTANEOUS | Status: DC

## 2017-11-22 MED ORDER — PANTOPRAZOLE SODIUM 40 MG PO TBEC: 40.0000 mg | DELAYED_RELEASE_TABLET | Freq: Every day | ORAL | Status: DC

## 2017-11-22 MED ORDER — METOPROLOL SUCCINATE ER 50 MG PO TB24
50.0000 mg | ORAL_TABLET | Freq: Every day | ORAL | Status: DC
  Administered 2017-11-23 – 2017-12-02 (×10): 50 mg via ORAL
  Filled 2017-11-22 (×10): qty 1

## 2017-11-22 MED ORDER — ASPIRIN EC 81 MG PO TBEC: 81.0000 mg | DELAYED_RELEASE_TABLET | Freq: Every day | ORAL | Status: DC

## 2017-11-22 MED ORDER — DIPHENHYDRAMINE HCL 25 MG PO CAPS: 25.0000 mg | ORAL_CAPSULE | Freq: Four times a day (QID) | ORAL | Status: DC | PRN

## 2017-11-22 MED ORDER — HEPARIN SODIUM (PORCINE) 5000 UNIT/ML IJ SOLN: 5000.0000 [IU] | Freq: Three times a day (TID) | INTRAMUSCULAR | Status: DC

## 2017-11-22 MED ORDER — PANTOPRAZOLE SODIUM 40 MG PO TBEC
40.0000 mg | DELAYED_RELEASE_TABLET | Freq: Every day | ORAL | Status: DC
  Administered 2017-11-23 – 2017-12-02 (×10): 40 mg via ORAL
  Filled 2017-11-22 (×10): qty 1

## 2017-11-22 MED ORDER — BISACODYL 10 MG RE SUPP: 10.0000 mg | Freq: Every day | RECTAL | Status: DC | PRN

## 2017-11-22 MED ORDER — LACOSAMIDE 200 MG PO TABS: 200.0000 mg | ORAL_TABLET | Freq: Two times a day (BID) | ORAL | Status: DC

## 2017-11-22 MED ORDER — ACETAMINOPHEN 325 MG PO TABS: 650.0000 mg | ORAL_TABLET | Freq: Four times a day (QID) | ORAL | Status: DC | PRN

## 2017-11-22 MED ORDER — PHENYTOIN SODIUM EXTENDED 100 MG PO CAPS: 400.0000 mg | ORAL_CAPSULE | Freq: Every morning | ORAL | Status: DC

## 2017-11-22 MED ORDER — PROCHLORPERAZINE EDISYLATE 10 MG/2ML IJ SOLN: 5.0000 mg | Freq: Four times a day (QID) | INTRAMUSCULAR | Status: DC | PRN

## 2017-11-22 MED ORDER — ALBUTEROL SULFATE HFA 108 (90 BASE) MCG/ACT IN AERS: 1.0000 | INHALATION_SPRAY | Freq: Four times a day (QID) | RESPIRATORY_TRACT | Status: DC | PRN

## 2017-11-22 MED ORDER — ALBUTEROL SULFATE (2.5 MG/3ML) 0.083% IN NEBU: 3.0000 mL | INHALATION_SOLUTION | RESPIRATORY_TRACT | Status: DC | PRN

## 2017-11-22 MED ORDER — RESOURCE THICKENUP CLEAR PO POWD: ORAL | Status: DC | PRN

## 2017-11-22 MED ORDER — PHENYTOIN SODIUM EXTENDED 100 MG PO CAPS
300.0000 mg | ORAL_CAPSULE | Freq: Every day | ORAL | Status: DC
  Administered 2017-11-22 – 2017-12-01 (×10): 300 mg via ORAL
  Filled 2017-11-22 (×10): qty 3

## 2017-11-22 MED ORDER — LEVOTHYROXINE SODIUM 25 MCG PO TABS: 50.0000 ug | ORAL_TABLET | Freq: Every day | ORAL | Status: DC

## 2017-11-22 MED ORDER — INSULIN ASPART 100 UNIT/ML ~~LOC~~ SOLN
0.0000 [IU] | Freq: Three times a day (TID) | SUBCUTANEOUS | Status: DC
  Administered 2017-11-22: 5 [IU] via SUBCUTANEOUS
  Administered 2017-11-23 (×3): 3 [IU] via SUBCUTANEOUS
  Administered 2017-11-24: 5 [IU] via SUBCUTANEOUS
  Administered 2017-11-24 – 2017-11-25 (×3): 3 [IU] via SUBCUTANEOUS
  Administered 2017-11-25: 5 [IU] via SUBCUTANEOUS
  Administered 2017-11-25: 3 [IU] via SUBCUTANEOUS
  Administered 2017-11-26: 8 [IU] via SUBCUTANEOUS
  Administered 2017-11-26: 5 [IU] via SUBCUTANEOUS
  Administered 2017-11-26: 8 [IU] via SUBCUTANEOUS
  Administered 2017-11-27: 3 [IU] via SUBCUTANEOUS
  Administered 2017-11-27: 2 [IU] via SUBCUTANEOUS
  Administered 2017-11-27: 11 [IU] via SUBCUTANEOUS
  Administered 2017-11-28 (×3): 2 [IU] via SUBCUTANEOUS
  Administered 2017-11-29: 3 [IU] via SUBCUTANEOUS
  Administered 2017-11-29 (×2): 2 [IU] via SUBCUTANEOUS
  Administered 2017-11-30 – 2017-12-01 (×3): 3 [IU] via SUBCUTANEOUS
  Administered 2017-12-01: 2 [IU] via SUBCUTANEOUS
  Administered 2017-12-01: 8 [IU] via SUBCUTANEOUS
  Administered 2017-12-02: 2 [IU] via SUBCUTANEOUS

## 2017-11-22 MED ORDER — CHLORHEXIDINE GLUCONATE 0.12 % MT SOLN
15.0000 mL | Freq: Two times a day (BID) | OROMUCOSAL | Status: DC
  Administered 2017-11-23 – 2017-11-30 (×9): 15 mL via OROMUCOSAL
  Filled 2017-11-22 (×13): qty 15

## 2017-11-22 MED ORDER — INSULIN ASPART 100 UNIT/ML ~~LOC~~ SOLN
3.0000 [IU] | Freq: Three times a day (TID) | SUBCUTANEOUS | Status: DC
  Administered 2017-11-22 – 2017-12-02 (×27): 3 [IU] via SUBCUTANEOUS

## 2017-11-22 MED ORDER — FLEET ENEMA 7-19 GM/118ML RE ENEM
1.0000 | ENEMA | Freq: Once | RECTAL | Status: AC | PRN
  Administered 2017-11-28: 1 via RECTAL
  Filled 2017-11-22: qty 1

## 2017-11-22 MED ORDER — ORAL CARE MOUTH RINSE
15.0000 mL | Freq: Two times a day (BID) | OROMUCOSAL | Status: DC
  Administered 2017-11-22 – 2017-11-24 (×4): 15 mL via OROMUCOSAL

## 2017-11-22 MED ORDER — PROCHLORPERAZINE 25 MG RE SUPP: 12.5000 mg | Freq: Four times a day (QID) | RECTAL | Status: DC | PRN

## 2017-11-22 MED ORDER — MUSCLE RUB 10-15 % EX CREA
TOPICAL_CREAM | Freq: Four times a day (QID) | CUTANEOUS | Status: DC | PRN
  Administered 2017-11-23: 1 via TOPICAL
  Filled 2017-11-22: qty 85

## 2017-11-22 MED ORDER — LEVETIRACETAM 500 MG PO TABS
1000.0000 mg | ORAL_TABLET | Freq: Two times a day (BID) | ORAL | Status: DC
  Administered 2017-11-22 – 2017-12-02 (×20): 1000 mg via ORAL
  Filled 2017-11-22 (×20): qty 2

## 2017-11-22 MED ORDER — ASPIRIN 81 MG PO CHEW
81.0000 mg | CHEWABLE_TABLET | Freq: Every day | ORAL | Status: DC
  Administered 2017-11-23 – 2017-12-02 (×10): 81 mg via ORAL
  Filled 2017-11-22 (×10): qty 1

## 2017-11-22 MED ORDER — ENSURE ENLIVE PO LIQD: 237.0000 mL | Freq: Two times a day (BID) | ORAL | Status: DC

## 2017-11-22 MED ORDER — LEVETIRACETAM 500 MG PO TABS: 1000.0000 mg | ORAL_TABLET | Freq: Two times a day (BID) | ORAL | Status: DC

## 2017-11-22 MED ORDER — DICLOFENAC SODIUM 1 % TD GEL
4.0000 g | Freq: Four times a day (QID) | TRANSDERMAL | Status: DC | PRN
  Filled 2017-11-22: qty 100

## 2017-11-22 MED ORDER — PHENYTOIN SODIUM EXTENDED 100 MG PO CAPS
400.0000 mg | ORAL_CAPSULE | Freq: Every day | ORAL | Status: DC
  Administered 2017-11-23 – 2017-12-02 (×10): 400 mg via ORAL
  Filled 2017-11-22 (×10): qty 4

## 2017-11-22 MED ORDER — INSULIN ASPART 100 UNIT/ML ~~LOC~~ SOLN: 4.0000 [IU] | Freq: Three times a day (TID) | SUBCUTANEOUS | Status: DC

## 2017-11-22 MED ORDER — INSULIN GLARGINE 100 UNIT/ML ~~LOC~~ SOLN
30.0000 [IU] | Freq: Every day | SUBCUTANEOUS | Status: DC
  Administered 2017-11-22 – 2017-12-01 (×9): 30 [IU] via SUBCUTANEOUS
  Filled 2017-11-22 (×10): qty 0.3

## 2017-11-22 MED ORDER — TROLAMINE SALICYLATE 10 % EX CREA: TOPICAL_CREAM | Freq: Four times a day (QID) | CUTANEOUS | Status: DC | PRN

## 2017-11-22 MED ORDER — PRO-STAT SUGAR FREE PO LIQD
30.0000 mL | Freq: Three times a day (TID) | ORAL | Status: DC
  Administered 2017-11-22 – 2017-12-02 (×17): 30 mL via ORAL
  Filled 2017-11-22 (×21): qty 30

## 2017-11-22 MED ORDER — LACOSAMIDE 200 MG PO TABS
200.0000 mg | ORAL_TABLET | Freq: Two times a day (BID) | ORAL | Status: DC
  Administered 2017-11-22 – 2017-12-02 (×20): 200 mg via ORAL
  Filled 2017-11-22 (×21): qty 1

## 2017-11-22 MED ORDER — LEVOTHYROXINE SODIUM 25 MCG PO TABS
50.0000 ug | ORAL_TABLET | Freq: Every day | ORAL | Status: DC
  Administered 2017-11-23 – 2017-12-02 (×10): 50 ug via ORAL
  Filled 2017-11-22 (×10): qty 2

## 2017-11-22 MED ORDER — ALUM & MAG HYDROXIDE-SIMETH 200-200-20 MG/5ML PO SUSP: 30.0000 mL | ORAL | Status: DC | PRN

## 2017-11-22 MED ORDER — HYDRALAZINE HCL 50 MG PO TABS
100.0000 mg | ORAL_TABLET | Freq: Three times a day (TID) | ORAL | Status: DC
  Administered 2017-11-22 – 2017-12-02 (×27): 100 mg via ORAL
  Filled 2017-11-22 (×29): qty 2

## 2017-11-22 MED ORDER — GUAIFENESIN-DM 100-10 MG/5ML PO SYRP: 5.0000 mL | ORAL_SOLUTION | Freq: Four times a day (QID) | ORAL | Status: DC | PRN

## 2017-11-22 NOTE — Progress Notes (Addendum)
RT went in pt room to give pulmicort treatment, pt states he does not take that medication at home, that he only takes albuterol at home PRN. Pt also states that he does not feel like he needs any treatments at this time. Pts BS are clear/diminished bilaterally. RT also offered pt his BIPAP for the night and pt prefers his home machine, which his wife will bring from home for him tomorrow. RT explained to pt that if he changes his mind and would like one of the hospital provided machines that we would be happy to do so. RT will continue to monitor.

## 2017-11-22 NOTE — Progress Notes (Signed)
Cristina Gong, RN  Rehab Admission Coordinator  Physical Medicine and Rehabilitation  PMR Pre-admission  Signed  Date of Service:  11/22/2017 12:21 PM       Related encounter: Admission (Discharged) from 10/27/2017 in [facility]      Signed         Show:Clear all [x] Manual[x] Template[x] Copied  Added by: [x] Julious Payer Vertis Kelch, RN  [] Hover for details PMR Admission Coordinator Pre-Admission Assessment  Patient: Daniel [name] is an 52 y.o., male MRN: [id number] DOB: 02/06/1966 Height: 5\' 9"  (175.3 cm) Weight: 134.4 kg                                                                                                                                                  Insurance Information PRIMARY: uninsured        Medicaid Application Date:       Case Manager:  Disability Application Date:       Case Worker:   Wife had applied for Medicaid MAD 9/6 with Leigh Casaus at [phone]. I left her a voicemail on 10/1 to contact me to discuss current status of application. Pt has been with long term disability with his company and lost his BCBS coverage pta. He has not been able to afford his insulin pta  Emergency Contact Information         Contact Information    Name Relation Home Work [place], [place] Spouse [phone]  [phone]   Elmer Sow Sister  [place]   Philomena Course Mother [phone]  [phone]     Current Medical History  Patient Admitting Diagnosis: debility  History of Present Illness:HPI:Ryen C Neville is a 52 year old malewith history of PE, OSA, morbid obesity, 3-4 weeks of progressive encephalopathy with hallucination and falls who was admitted to outside hospital for work-up. He was found to have malignant hypertension with periods of unresponsiveness due to seizures and was started on Keppra and Vimpat. He continued to have these episodes and was transferred to [facility]  on 10/27/2017 for continuous EEG monitoring. He was intubated for airway protection due to status epilepticus and neurology following for assistance. Hospital course significant for fevers due to Haemophilus pneumonia CAP, severe hypoglycemia, dysphagia, diarrhea as well as encephalopathy. He tolerated extubation on zero 9/13 and has been refusing to use CPAP. He developed recurrent fevers on 9/17 and was pan cultured and started on ceftriaxone empirically. ID consulted for input and recommended discontinuation of antibiotics due to concerns of drug fever v/srelated to seizures.Left upper extremity Dopplers done and were positive for superficial thrombosis in the cephalic vein-this has been treated with local measures. He has effervesced and mentation is improving. He had recurrent episode of lethargy with waxing and waning of mental status. CT of head negative and neurology recommended increasing fosphenytoin dose to 150  mg 3 times daily.   He has also had issues with acute on chronic renal failure with decreasing urine output and hyponatremia.  Nephrology felt this was due to volume depletion as well as urinary retention. Lisinopril DC'd and patient treated with IV fluids as well as Foley.   Blood sugars continue to be poorly controlled with patient reporting baseline blood sugars being greater than 400's.  Past Medical History      Past Medical History:  Diagnosis Date  . Arthritis    "elbows and wrists" (11/13/2017)  . CHF (congestive heart failure) ([place]) 08/2016  . CKD stage 3 due to type 2 diabetes mellitus ([place]) 06/2016  . Diabetes mellitus type 2 in obese ([place])   . High cholesterol   . Hypertension   . Hypothyroidism   . Morbid obesity ([place])   . On home oxygen therapy    "3L; now only at night" (11/13/2017)  . OSA on CPAP   . Pneumonia 2017   "double pneumonia"  . Pneumonia 10/2017   "while in ICU" (11/13/2017)  . Pulmonary embolism ([place])   . Seizure  ([place])    "10/24/2017-10/28/2017; couldn't get them stopped til medically induced coma. after they brought him out he started having more; can't find RX to stop them" (11/13/2017)  . Stroke [facility])    "scans showed several mini strokes in the past; nothing recent" (11/13/2017)    Family History  family history is not on file.  Prior Rehab/Hospitalizations:  Has the patient had major surgery during 100 days prior to admission? No  Current Medications   Current Facility-Administered Medications:  .  acetaminophen (TYLENOL) tablet 650 mg, 650 mg, Oral, Q6H PRN, 650 mg at 11/21/17 2332 **OR** acetaminophen (TYLENOL) suppository 650 mg, 650 mg, Rectal, Q6H PRN, Vance Gather B, MD, 650 mg at 11/07/17 1650 .  albuterol (PROVENTIL) (2.5 MG/3ML) 0.083% nebulizer solution 3 mL, 3 mL, Inhalation, Q4H PRN, Patrecia Pour, MD, 3 mL at 11/18/17 0929 .  aspirin chewable tablet 81 mg, 81 mg, Oral, Daily, Patrecia Pour, MD, 81 mg at 11/22/17 1036 .  bisacodyl (DULCOLAX) suppository 10 mg, 10 mg, Rectal, Daily PRN, Vance Gather B, MD .  budesonide (PULMICORT) nebulizer solution 0.25 mg, 0.25 mg, Nebulization, BID, Vance Gather B, MD, 0.25 mg at 11/22/17 0832 .  chlorhexidine (PERIDEX) 0.12 % solution 15 mL, 15 mL, Mouth Rinse, BID, Patrecia Pour, MD, 15 mL at 11/21/17 2139 .  diclofenac sodium (VOLTAREN) 1 % transdermal gel 4 g, 4 g, Topical, QID PRN, Bodenheimer, Charles A, NP, 4 g at 11/19/17 1016 .  diphenhydrAMINE (BENADRYL) capsule 25 mg, 25 mg, Oral, Q6H PRN, Elodia Florence., MD, 25 mg at 11/22/17 0001 .  feeding supplement (ENSURE ENLIVE) (ENSURE ENLIVE) liquid 237 mL, 237 mL, Oral, BID BM, Vance Gather B, MD, 237 mL at 11/22/17 1041 .  heparin injection 5,000 Units, 5,000 Units, Subcutaneous, Q8H, Vance Gather B, MD, 5,000 Units at 11/22/17 0546 .  hydrALAZINE (APRESOLINE) injection 10 mg, 10 mg, Intravenous, Q4H PRN, Vance Gather B, MD, 10 mg at 11/15/17 0411 .  hydrALAZINE (APRESOLINE) tablet 100  mg, 100 mg, Oral, Q8H, Vance Gather B, MD, 100 mg at 11/22/17 0546 .  insulin aspart (novoLOG) injection 0-20 Units, 0-20 Units, Subcutaneous, TID WC, Elodia Florence., MD, 4 Units at 11/22/17 0820 .  insulin aspart (novoLOG) injection 0-5 Units, 0-5 Units, Subcutaneous, QHS, Elodia Florence., MD, Stopped at 11/21/17 2226 .  insulin aspart (  novoLOG) injection 4 Units, 4 Units, Subcutaneous, TID WC, Elodia Florence., MD, 4 Units at 11/21/17 1700 .  insulin glargine (LANTUS) injection 20 Units, 20 Units, Subcutaneous, QHS, Elodia Florence., MD, 20 Units at 11/21/17 2150 .  labetalol (NORMODYNE,TRANDATE) injection 10 mg, 10 mg, Intravenous, Q2H PRN, Patrecia Pour, MD, 10 mg at 11/04/17 1205 .  lacosamide (VIMPAT) tablet 200 mg, 200 mg, Oral, BID, Elodia Florence., MD, 200 mg at 11/22/17 1036 .  levETIRAcetam (KEPPRA) tablet 1,000 mg, 1,000 mg, Oral, BID, Elodia Florence., MD, 1,000 mg at 11/22/17 1036 .  levothyroxine (SYNTHROID, LEVOTHROID) tablet 50 mcg, 50 mcg, Oral, QAC breakfast, Patrecia Pour, MD, 50 mcg at 11/22/17 0824 .  MEDLINE mouth rinse, 15 mL, Mouth Rinse, q12n4p, Vance Gather B, MD, 15 mL at 11/21/17 1600 .  metoprolol succinate (TOPROL-XL) 24 hr tablet 50 mg, 50 mg, Oral, Daily, Vance Gather B, MD, 50 mg at 11/22/17 1037 .  pantoprazole (PROTONIX) EC tablet 40 mg, 40 mg, Oral, Daily, Vance Gather B, MD, 40 mg at 11/22/17 1036 .  phenytoin (DILANTIN) ER capsule 300 mg, 300 mg, Oral, QHS, Bertis Ruddy, RPH, 300 mg at 11/21/17 2140 .  phenytoin (DILANTIN) ER capsule 400 mg, 400 mg, Oral, q morning - 10a, Bertis Ruddy, RPH, 400 mg at 11/22/17 1036 .  RESOURCE THICKENUP CLEAR, , Oral, PRN, Vance Gather B, MD .  sodium chloride flush (NS) 0.9 % injection 10-40 mL, 10-40 mL, Intracatheter, Q12H, Vance Gather B, MD, 10 mL at 11/22/17 1044 .  sodium chloride flush (NS) 0.9 % injection 10-40 mL, 10-40 mL, Intracatheter, PRN, Patrecia Pour, MD, 40 mL at 11/20/17  0845  Patients Current Diet:     Diet Order                  Diet regular Room service appropriate? Yes; Fluid consistency: Thin  Diet effective now               Precautions / Restrictions Precautions Precautions: Fall Precaution Comments: NG tube Restrictions Weight Bearing Restrictions: No   Has the patient had 2 or more falls or a fall with injury in the past year?Yes over past month pta with gradual functional and cognitive decline.  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 None of the above  Prior Functional Level Prior Function Level of Independence: Independent Gait / Transfers Assistance Needed: Walked without help until a few days before coming to hospital  ADL's / Homemaking Assistance Needed: Needed wife Assist about a month prior to coming to hospital Comments: Pt was independent until 1 month ago when confusion began to increase.   Self Care: Did the patient need help bathing, dressing, using the toilet or eating?  Independent  Indoor Mobility: Did the patient need assistance with walking from room to room (with or without device)? Independent  Stairs: Did the patient need assistance with internal or external stairs (with or without device)? Independent  Functional Cognition: Did the patient need help planning regular tasks such as shopping or remembering to take medications? Independent  Current Functional Level Cognition  Overall Cognitive Status: Impaired/Different from baseline Current Attention Level: Selective Orientation Level: Disoriented to  place Following Commands: Follows one step commands  consistently Safety/Judgement: Decreased awareness of safety, Decreased awareness of deficits General Comments: Pt more alert today, very talkative and appropriately joking at times.  Completely unaware he is in the hospital.     Extremity Assessment (includes Sensation/Coordination)  Upper Extremity Assessment: (limited B shoulder ROM and edema in B hands) RUE Deficits / Details: 2-/5 strength with significant edema LUE Deficits / Details: 3-/5 strength with edema noted.   Lower Extremity Assessment: Defer to PT evaluation RLE Deficits / Details: grossly 2-/5 LLE Deficits / Details: grossly 2-/5    ADLs  Overall ADL's : Needs assistance/impaired Grooming: Wash/dry hands, Wash/dry face, Set up, Sitting Grooming Details (indicate cue type and reason): increased time and encouragmenet to engage in self care tasks, at time reporting "my wife will do it for me' Upper Body Dressing : Minimal assistance(donning gown to wear as a robe) Toileting- Clothing Manipulation and Hygiene: Total assistance, +2 for physical assistance, +2 for safety/equipment, Bed level Toileting - Clothing Manipulation Details (indicate cue type and reason): soiled brief and bed pads Functional mobility during ADLs: Minimal assistance General ADL Comments: PATIENT WAS MIN A WITH SIT TO STAND FROM CHIAR AND SARA LIFT.     Mobility  Overal bed mobility: Needs Assistance Bed Mobility: Supine to Sit Rolling: Mod assist Supine to sit: Min assist Sit to supine: Min assist General bed mobility comments: increased time and effort; min guard for safety    Transfers  Overall transfer level: Needs assistance Equipment used: Rolling walker (2 wheeled)(gait belt) Transfers: Sit to/from Stand Sit to Stand: Min assist, +2 physical assistance General transfer comment: assist to power up into standing and for balance upon standing from EOB and recliner; cues for safe hand placement    Ambulation / Gait / Stairs /  Wheelchair Mobility  Ambulation/Gait Ambulation/Gait assistance: Mod assist, +2 safety/equipment Gait Distance (Feet): (42ft X2) Assistive device: Rolling walker (2 wheeled) Gait Pattern/deviations: Step-to pattern, Decreased step length - right, Decreased step length - left, Decreased dorsiflexion - right, Decreased dorsiflexion - left, Wide base of support, Trunk flexed General Gait Details: assistance for balance when weight shifting and to guide RW; multimodal cues for hips extension and vc for safe proximity to RW and increased bilat step lengths Gait velocity: decreased    Posture / Balance Dynamic Sitting Balance Sitting balance - Comments: pt able to sit EOB with grossly min guard/min A  Balance Overall balance assessment: Needs assistance Sitting-balance support: Feet supported, Bilateral upper extremity supported Sitting balance-Leahy Scale: Fair Sitting balance - Comments: pt able to sit EOB with grossly min guard/min A  Standing balance support: Bilateral upper extremity supported Standing balance-Leahy Scale: Poor Standing balance comment: stood in tilt bed, challenge dynamic reaching with B UES and midline positioning     Special needs/care consideration BiPAP/CPAP refuses to use CPM n/a Continuous Drip IV n/a Dialysis n/a Life Vest n/a Oxygen n/a Special Bed n/a; seizure precautions Trach Size n/a Wound Vac n/a Skin intact Bowel mgmt: continent LBM 10/1 Bladder mgmt: external catheter Diabetic mgmt Hgb A1c 15.8; has had difficulty over past month purchasing insulin due to loss of his [employer] insurance 1 month pta   Previous Home Environment Living Arrangements: Spouse/significant other  Lives With: Spouse Available Help at Discharge: Family, Available 24 hours/day Type of Home: House Home Layout: One level Home Access: Stairs to enter Entrance Stairs-Rails: Right, Left, Can reach both Entrance Stairs-Number of Steps: 3 to 4 steps Bathroom Shower/Tub:  Tub/shower  unit, Architectural technologist: Standard Bathroom Accessibility: Yes How Accessible: Accessible via walker Home Care Services: No Additional Comments: wife verified info pta  Discharge Living Setting Plans for Discharge Living Setting: Patient's home, Lives with (comment)(spouse) Type of Home at Discharge: House Discharge Home Layout: One level Discharge Home Access: Stairs to enter Entrance Stairs-Rails: Right, Left, Can reach both Entrance Stairs-Number of Steps: 3 to 4 Discharge Bathroom Shower/Tub: Tub/shower unit, Curtain Discharge Bathroom Toilet: Standard Discharge Bathroom Accessibility: Yes How Accessible: Accessible via walker Does the patient have any problems obtaining your medications?: Yes (Describe)(lost his BCBS insurance last month and unable to affor his i)  Producer, television/film/video Systems Patient Roles: Spouse, Parent Contact Information: wife, Wells Guiles at [phone] Anticipated Caregiver: wife Anticipated Caregiver's Contact Information: see above Ability/Limitations of Caregiver: no limitations Caregiver Availability: 24/7 Discharge Plan Discussed with Primary Caregiver: Yes Is Caregiver In Agreement with Plan?: Yes Does Caregiver/Family have Issues with Lodging/Transportation while Pt is in Rehab?: No(wife staying alot with him in hospital; asking about meal vo)  Goals/Additional Needs Patient/Family Goal for Rehab: supervision to min assist PT and OT, Mod I SLP Expected length of stay: [facility]OS 10 to 14; due to uninsured, pt want to go home asap Equipment Needs: seizure precautions Pt/Family Agrees to Admission and willing to participate: Yes Program Orientation Provided & Reviewed with Pt/Caregiver Including Roles  & Responsibilities: Yes  Barriers to Discharge: Insurance for SNF coverage  Decrease burden of Care through IP rehab admission: n/a  Possible need for SNF placement upon discharge: not anticipated  Patient Condition: This  patient's condition remains as documented in the consult dated 11/20/2017, in which the Rehabilitation Physician determined and documented that the patient's condition is appropriate for intensive rehabilitative care in an inpatient rehabilitation facility. Will admit to inpatient rehab today.  Preadmission Screen Completed By:  Cleatrice Burke, 11/22/2017 12:21 PM ______________________________________________________________________   Discussed status with Dr. Posey Pronto on 11/22/2017 at  1227 and received telephone approval for admission today.  Admission Coordinator:  Cleatrice Burke, time 6734 Date 11/22/2017           Cosigned by: Jamse Arn, MD at 11/22/2017 1:15 PM  Revision History

## 2017-11-22 NOTE — Progress Notes (Signed)
Inpatient Rehabilitation Admissions Coordinator  I met with patient and wife at bedside and they are in agreement to admit to inpt rehab today. I have contacted Dr. Maryland Pink for d/c order, RN Cm and SW. I will make the arrangements to admit today.  Danne Baxter, RN, MSN Rehab Admissions Coordinator [phone] 11/22/2017 11:57 AM

## 2017-11-22 NOTE — Progress Notes (Signed)
Pt picked up by CIR nurse techs and transported off unit via bed with spouse and belongings to the side. Pt IV remains intact prior to discharge to CIR; telemetry removed. Francis Gaines Zekiel Torian RN.

## 2017-11-22 NOTE — Progress Notes (Signed)
Physical Therapy Treatment Patient Details Name: Daniel [name] MRN: [id number] DOB: 11-02-1965 Today's Date: 11/22/2017    History of Present Illness 52 yo male presented to [facility] with altered mental status, confusion, headaches, hallucinations, and falls. SBP was over 200. In ER at [facility] he was started on esmolol gtt. Developed seizure in [place] at [facility]. He continued to have seizures and transferred to [facility]. He was started on burst suppression with versed and required intubation 9/07. Developed fever and increased sputum 9/10.  Intubated 9/10-9/13.      PT Comments    Patient continues to make progress toward PT goals and tolerated session well. Continue to recommend CIR for further skilled PT services to maximize independence and safety with mobility.    Follow Up Recommendations  CIR     Equipment Recommendations  Other (comment)(TBD next venue)    Recommendations for Other Services Rehab consult     Precautions / Restrictions Precautions Precautions: Fall    Mobility  Bed Mobility Overal bed mobility: Needs Assistance Bed Mobility: Supine to Sit     Supine to sit: Min guard     General bed mobility comments: for safety; use of rail  Transfers Overall transfer level: Needs assistance Equipment used: Rolling walker (2 wheeled) Transfers: Sit to/from Stand Sit to Stand: Min assist;+2 physical assistance         General transfer comment: cues for safe hand placement; assist to power up and to steady  Ambulation/Gait Ambulation/Gait assistance: Mod assist;+2 safety/equipment Gait Distance (Feet): (186ft total with 3 seated rest breaks ) Assistive device: Rolling walker (2 wheeled) Gait Pattern/deviations: Decreased step length - right;Decreased step length - left;Decreased dorsiflexion - right;Decreased dorsiflexion - left;Wide base of support;Trunk flexed;Step-through pattern Gait velocity: decreased   General Gait Details: notable bilat LE  weakness with increased gait distance and need for seated rest breaks; assistance to maintain safe distance from RW and for balance; multimodal cues for posture and vc for safe use of AD   Stairs             Wheelchair Mobility    Modified Rankin (Stroke Patients Only)       Balance Overall balance assessment: Needs assistance Sitting-balance support: Feet supported;Bilateral upper extremity supported Sitting balance-Leahy Scale: Fair     Standing balance support: Bilateral upper extremity supported Standing balance-Leahy Scale: Poor                              Cognition Arousal/Alertness: Awake/alert Behavior During Therapy: WFL for tasks assessed/performed Overall Cognitive Status: Impaired/Different from baseline Area of Impairment: Following commands;Safety/judgement;Problem solving;Memory;Awareness                   Current Attention Level: Selective Memory: Decreased short-term memory Following Commands: Follows one step commands consistently Safety/Judgement: Decreased awareness of safety;Decreased awareness of deficits Awareness: Emergent Problem Solving: Requires verbal cues        Exercises      General Comments        Pertinent Vitals/Pain Pain Assessment: Faces Faces Pain Scale: Hurts little more Pain Location: L ankle  Pain Descriptors / Indicators: Guarding;Sore Pain Intervention(s): Limited activity within patient's tolerance;Repositioned;Monitored during session    Home Living         Home Access: Stairs to enter Entrance Stairs-Rails: Right;Left;Can reach both Home Layout: One level   Additional Comments: wife verified info pta    Prior Function Level of Independence: Independent  PT Goals (current goals can now be found in the care plan section) Progress towards PT goals: Progressing toward goals    Frequency    Min 3X/week      PT Plan Current plan remains appropriate     Co-evaluation              AM-PAC PT "6 Clicks" Daily Activity  Outcome Measure  Difficulty turning over in bed (including adjusting bedclothes, sheets and blankets)?: Unable Difficulty moving from lying on back to sitting on the side of the bed? : Unable Difficulty sitting down on and standing up from a chair with arms (e.g., wheelchair, bedside commode, etc,.)?: Unable Help needed moving to and from a bed to chair (including a wheelchair)?: A Little Help needed walking in hospital room?: A Little Help needed climbing 3-5 steps with a railing? : A Lot 6 Click Score: 11    End of Session Equipment Utilized During Treatment: Gait belt Activity Tolerance: Patient tolerated treatment well Patient left: with call bell/phone within reach;with family/visitor present;in chair Nurse Communication: Mobility status PT Visit Diagnosis: Muscle weakness (generalized) (M62.81)     Time: 1000-1025 PT Time Calculation (min) (ACUTE ONLY): 25 min  Charges:  $Gait Training: 23-37 mins                     Earney Navy, PTA Acute Rehabilitation Services Pager: [phone] Office: [phone]     Darliss Cheney 11/22/2017, 12:58 PM

## 2017-11-22 NOTE — Progress Notes (Signed)
Jamse Arn, MD    Jamse Arn, MD  Physician  Physical Medicine and Rehabilitation      Consult Note  Signed     Date of Service:  11/20/2017 10:44 AM         Related encounter: Admission (Discharged) from 10/27/2017 in [facility]             Signed          Expand All Collapse All            Expand widget buttonCollapse widget button    Show:Clear all   ManualTemplateCopied  Added by:     Bary Leriche, PA-C  Jamse Arn, MD   Hover for detailscustomization button                                                                                                                                                             untitled image              Physical Medicine and Rehabilitation Consult        Reason for Consult: Debility due to status epilepticus.    Referring Physician:  Dr. Fayrene Helper         HPI: Daniel [name] is a 52 y.o. male with history of PE, OSA, morbid obesity, 3 to 4 weeks of progressive encephalopathy with hallucinations and falls who was admitted to outside hospital for work-up.  History taken from chart review and patient.  He was found to have malignant hypertension with periods of unresponsiveness due to seizures.  He was started on Keppra and Vimpat with continued episodes and transferred to [facility] on 10/27/2017 for continuous EEG monitoring.  Neurology consulted for assistance with status epilepticus and he was intubated for airway protection.  Hospital course significant for fevers question due to Hemophilus pneumoniae CAP, acute on chronic renal failure with decreasing urine output, issues with hyperglycemia, dysphagia, issues with diarrhea and encephalopathy. He  tolerated extubation on 09/13 and has been refusing to use CPAP. He developed recurrent fevers on 9/17 and was pan cultured.  Hypernatremia and worsening of renal status felt to be due to volume depletion as well as urinary retention.  Lisinopril placed on hold and patient treated with IV fluids for hydration.       Dr. Johnnye Sima recommended dose attenuation of ceftriaxone and questioned fevers due to seizures versus medication related. LUE + superficial thrombosis in cephalic vein---treated with local measures.  He has defervesced and mentation is improving.  Blood sugars continue to be poorly controlled with patient reporting baseline blood sugars greater than 400.  He is showing improvement in  activity tolerance but continues to be limited by confusion, cognitive deficits with poor awareness of deficits, poor safety awareness and balance deficits. CIR recommended due to functional decline.         Review of Systems   Constitutional: Negative for chills and fever.   HENT: Negative for hearing loss and tinnitus.    Eyes: Negative for blurred vision, double vision and photophobia.   Respiratory: Negative for cough.    Cardiovascular: Negative for chest pain and palpitations.   Gastrointestinal: Negative for abdominal pain, heartburn and nausea.   Musculoskeletal: Positive for myalgias.   Skin: Negative for rash.   Neurological: Positive for weakness. Negative for headaches.   Psychiatric/Behavioral: Positive for memory loss.   All other systems reviewed and are negative.                Past Medical History:    Diagnosis   Date    .   Arthritis            "elbows and wrists" (11/13/2017)    .   CHF (congestive heart failure) ([place])   08/2016    .   CKD stage 3 due to type 2 diabetes mellitus ([place])   06/2016    .   Diabetes mellitus type 2 in obese ([place])        .   High cholesterol        .   Hypertension        .    Hypothyroidism        .   Morbid obesity ([place])        .   On home oxygen therapy            "3L; now only at night" (11/13/2017)    .   OSA on CPAP        .   Pneumonia   2017        "double pneumonia"    .   Pneumonia   10/2017        "while in ICU" (11/13/2017)    .   Pulmonary embolism ([place])        .   Seizure ([place])            "10/24/2017-10/28/2017; couldn't get them stopped til medically induced coma. after they brought him out he started having more; can't find RX to stop them" (11/13/2017)    .   Stroke [facility])            "scans showed several mini strokes in the past; nothing recent" (11/13/2017)                Past Surgical History:    Procedure   Laterality   Date    .   CARDIAC CATHETERIZATION       12/2016                Family History    Problem   Relation   Age of Onset    .   Alpha-1 antitrypsin deficiency   Neg Hx        .   Asthma   Neg Hx        .   Breast cancer   Neg Hx        .   Stroke   Neg Hx        .   Colon cancer   Neg Hx        .  Coronary artery disease   Neg Hx        .   COPD   Neg Hx        .   Cystic fibrosis   Neg Hx        .   Diabetes   Neg Hx        .   Deep vein thrombosis   Neg Hx        .   Hypertension   Neg Hx        .   Hyperlipidemia   Neg Hx        .   Kidney disease   Neg Hx        .   Liver disease   Neg Hx        .   Lung cancer   Neg Hx        .   Lupus   Neg Hx        .   Neurofibromatosis   Neg Hx        .   Osteoporosis   Neg Hx        .   Ovarian cancer   Neg Hx        .   Positive PPD/TB Exposure   Neg Hx        .   Prostate cancer   Neg Hx        .   Pulmonary embolism   Neg Hx        .   Pulmonary fibrosis   Neg Hx        .   Rheum  arthritis   Neg Hx        .   Sarcoidosis   Neg Hx        .   Sleep apnea   Neg Hx        .   Thyroid disease   Neg Hx        .   Tuberculosis   Neg Hx        .   Tuberous sclerosis   Neg Hx              Social History:  Married. Disabled for a couple of years due to lung issues. He reports that he has never smoked. He quit smokeless tobacco use about 4 years ago.  His smokeless tobacco use included chew. He reports that he does not drink alcohol anymore. He reports that he does not use drugs.              Allergies    Allergen   Reactions    .   Montelukast   Other (See Comments)            Possibly cause headaches per spouse                 Medications Prior to Admission    Medication   Sig   Dispense   Refill    .   albuterol (PROVENTIL HFA) 108 (90 Base) MCG/ACT inhaler   Inhale 1 puff into the lungs every 6 (six) hours as needed for wheezing or shortness of breath.             Marland Kitchen   aspirin EC 81 MG tablet   Take 81 mg by mouth daily.             .   fluticasone furoate-vilanterol (BREO ELLIPTA) 200-25  MCG/INH AEPB   Inhale 1 puff into the lungs daily.             .   hydrALAZINE (APRESOLINE) 100 MG tablet   Take 100 mg by mouth 3 (three) times daily.             Marland Kitchen   HYDROcodone-acetaminophen (NORCO/VICODIN) 5-325 MG tablet   Take 1 tablet by mouth every 8 (eight) hours as needed (pain).             .   Insulin Detemir (LEVEMIR FLEXTOUCH) 100 UNIT/ML Pen   Inject 120 Units into the skin daily.            Marland Kitchen   levothyroxine (SYNTHROID, LEVOTHROID) 50 MCG tablet   Take 50 mcg by mouth daily.             Marland Kitchen   lisinopril (PRINIVIL,ZESTRIL) 10 MG tablet   Take 10 mg by mouth daily.       11    .   metFORMIN (GLUCOPHAGE) 500 MG tablet   Take 500 mg by mouth 2 (two) times daily with a meal.        11    .   omeprazole  (PRILOSEC) 20 MG capsule   Take 20 mg by mouth daily as needed (acid reflux/heartburn).             .   torsemide (DEMADEX) 20 MG tablet   Take 20 mg by mouth daily.                   Home:  Home Living  Family/patient expects to be discharged to:: Inpatient rehab  Living Arrangements: Spouse/significant other  Available Help at Discharge: Family, Available 24 hours/day  Type of Home: House  Home Equipment: None  Additional Comments: Information per chart. Pt unable to provide and family left room during evaluation.    Functional History:  Prior Function  Level of Independence: Needs assistance  Gait / Transfers Assistance Needed: Walked without help until a few days before coming to hospital   ADL's / Homemaking Assistance Needed: Needed wife Assist about a month prior to coming to hospital  Comments: Pt was independent until 1 month ago when confusion began to increase.   Functional Status:   Mobility:  Bed Mobility  Overal bed mobility: Needs Assistance  Bed Mobility: Supine to Sit  Rolling: Mod assist  Supine to sit: Min guard  Sit to supine: Max assist, +2 for physical assistance  General bed mobility comments: increased time and effort; min guard for safety  Transfers  Overall transfer level: Needs assistance  Equipment used: Rolling walker (2 wheeled)(gait belt)  Transfers: Sit to/from Stand  Sit to Stand: Min assist, +2 physical assistance  General transfer comment: assist to power up into standing and for balance upon standing from EOB and recliner; cues for safe hand placement  Ambulation/Gait  Ambulation/Gait assistance: Mod assist, +2 safety/equipment  Gait Distance (Feet): (27ft X2)  Assistive device: Rolling walker (2 wheeled)  Gait Pattern/deviations: Step-to pattern, Decreased step length - right, Decreased step length - left, Decreased dorsiflexion - right, Decreased dorsiflexion - left, Wide base of support,  Trunk flexed  General Gait Details: assistance for balance when weight shifting and to guide RW; multimodal cues for hips extension and vc for safe proximity to RW and increased bilat step lengths  Gait velocity: decreased       ADL:  ADL  Overall ADL's : Needs assistance/impaired  Grooming: Wash/dry hands, Wash/dry face, Oral care, Standing(standing in lift bed)  Grooming Details (indicate cue type and reason): increased time and encouragmenet to engage in self care tasks, at time reporting "my wife will do it for me'  Toileting- Water quality scientist and Hygiene: Total assistance, +2 for physical assistance, +2 for safety/equipment, Bed level  Toileting - Clothing Manipulation Details (indicate cue type and reason): soiled brief and bed pads  General ADL Comments: session focused on dynamic reaching with B UEs in tilt bed standing      Cognition:  Cognition  Overall Cognitive Status: Impaired/Different from baseline  Orientation Level: Oriented X4  Cognition  Arousal/Alertness: Awake/alert  Behavior During Therapy: WFL for tasks assessed/performed  Overall Cognitive Status: Impaired/Different from baseline  Area of Impairment: Attention, Following commands, Safety/judgement, Problem solving, Memory, Awareness  Orientation Level: Disoriented to, Time, Place  Current Attention Level: Selective  Memory: Decreased short-term memory  Following Commands: Follows one step commands with increased time, Follows one step commands consistently, Follows multi-step commands inconsistently  Safety/Judgement: Decreased awareness of safety, Decreased awareness of deficits  Awareness: Emergent  Problem Solving: Requires verbal cues  General Comments: Pt more alert today, very talkative and appropriately joking at times.  Completely unaware he is in the hospital.         Blood pressure (!) 110/46, pulse 86, temperature 98.9 F (37.2 C), temperature source Oral,  resp. rate 20, height 5\' 9"  (1.753 m), weight 135.7 kg, SpO2 97 %.  Physical Exam   Nursing note and vitals reviewed.  Constitutional: He appears well-developed. He is sleeping and cooperative. He is easily aroused.  Morbidly obese male. NAD.    HENT:   Head: Normocephalic and atraumatic.   Eyes: EOM are normal. Right eye exhibits no discharge. Left eye exhibits no discharge.   Neck: Normal range of motion. Neck supple.   Cardiovascular: Normal rate and regular rhythm.   Respiratory: Effort normal and breath sounds normal.   GI: Soft. Bowel sounds are normal.  Musculoskeletal: He exhibits edema (2+ LUE). He exhibits no tenderness.  Neurological: He is alert and easily aroused.  Tangential and distracted.  Kept left eye closed during exam--reports "habit".   He was able to answer basic orientation questions once rapport established and redirected.  Able to follow simple motor commands.  Motor: 4-4+/5 proximal distal throughout   Skin: Skin is warm and dry.  Psychiatric: His affect is blunt. His speech is delayed.         Lab Results Last 24 Hours  Imaging Results (Last 48 hours)                     Assessment/Plan:  Diagnosis: Debility   Labs independently reviewed.  Records reviewed and summated above.     1.Does the need for close, 24 hr/day medical supervision in concert with the patient's rehab needs make it unreasonable for this patient to be served in a less intensive setting? YesCo-Morbidities requiring supervision/potential   2. complications: PE, OSA (monitor for daytime somnolence and energy levels, encouraged compliance), morbid obesity (encourage weight loss), encephalopathy with hallucinations, fevers (cont to monitor for signs and symptoms of infection, further workup if indicated), acute on chronic renal failure (avoid nephrotoxic meds), medication noncompliance (current), uncontrolled blood glucose (Monitor in accordance with exercise and adjust meds as necessary), labile blood pressure (monitor), ABLA (transfuse if necessary to ensure appropriate perfusion for increased activity tolerance)   3.Due to safety, skin/wound care, disease management, pain management and patient education, does the patient require 24 hr/day rehab nursing? Yes   4.Does the patient require coordinated care of a physician, rehab nurse, PT (1-2 hrs/day, 5 days/week), OT (1-2 hrs/day, 5 days/week) and SLP (1-2 hrs/day, 5 days/week) to address physical and functional deficits in the context of the above medical diagnosis(es)? Yes Addressing deficits in the following areas: balance, endurance, locomotion, strength, transferring, bowel/bladder control, bathing, dressing, feeding, toileting, cognition and psychosocial support   5.Can the patient actively participate in an intensive therapy program of at least 3 hrs of therapy per day at least 5 days per week? Yes   6.The potential for patient to make measurable gains while on inpatient rehab is good   7.Anticipated functional outcomes upon discharge from inpatient rehab are supervision and min assist  with PT, supervision and min assist with OT,  modified independent with SLP.   8.Estimated rehab length of stay to reach the above functional goals is: 13-16 days.   9.Anticipated D/C setting: Home   10.Anticipated post D/C treatments: HH therapy and Home excercise program   11.Overall Rehab/Functional Prognosis: good      RECOMMENDATIONS:  This patient's condition is appropriate for continued rehabilitative care in the following setting: CIR if caregiver support available and patient willing to comply with recommendations  Patient has agreed to participate in recommended program. Potentially  Note that insurance prior authorization may be required for reimbursement for recommended care.     Comment: Rehab Admissions Coordinator to follow up.        I have personally performed a face to face diagnostic evaluation, including, but not limited to relevant history and physical exam findings, of this patient and developed relevant assessment and plan.  Additionally, I have reviewed and concur with the physician assistant's documentation above.      Delice Lesch, MD, ABPMR  Bary Leriche, PA-C  11/20/2017                Revision History                                        Routing History                                         Jamse Arn, MD    Jamse Arn, MD  Physician  Physical Medicine and Rehabilitation      Consult Note  Signed     Date of Service:  11/20/2017 10:44 AM         Related encounter: Admission (Discharged) from 10/27/2017 in [facility]             Signed          Expand All Collapse All            Expand widget buttonCollapse widget button    Show:Clear all   ManualTemplateCopied  Added by:     Bary Leriche, PA-C  Jamse Arn, MD   Hover for detailscustomization  button                                                                                                                                                             untitled image              Physical Medicine and Rehabilitation Consult        Reason for Consult: Debility due to status epilepticus.    Referring Physician:  Dr. Fayrene Helper         HPI: Daniel [name] is a 52 y.o. male with history of PE, OSA, morbid obesity, 3 to 4 weeks of progressive encephalopathy with hallucinations and falls who was admitted to outside hospital for work-up.  History taken from chart review and patient.  He was found to have malignant hypertension with periods of unresponsiveness due to seizures.  He was started on Keppra and Vimpat with continued episodes and transferred to [facility] on 10/27/2017 for continuous EEG monitoring.  Neurology consulted for assistance with status epilepticus and he was intubated for airway protection.  Hospital course significant for fevers question due to Hemophilus pneumoniae CAP, acute on chronic renal failure with decreasing urine output, issues with hyperglycemia, dysphagia, issues with diarrhea and encephalopathy. He tolerated extubation on 09/13 and has been refusing to use CPAP. He developed recurrent fevers on 9/17 and was pan cultured.  Hypernatremia and worsening of renal status felt to be due to volume depletion as well as urinary retention.  Lisinopril placed on hold and patient treated with IV fluids for hydration.       Dr. Johnnye Sima recommended dose attenuation of ceftriaxone and questioned fevers due to seizures versus medication related. LUE + superficial thrombosis in cephalic vein---treated with local measures.  He has defervesced and mentation is improving.  Blood sugars continue to be  poorly controlled with patient reporting baseline blood sugars greater than 400.  He is showing improvement in activity tolerance but continues to be limited by confusion, cognitive deficits with poor awareness of deficits, poor safety awareness and balance deficits. CIR recommended due to functional decline.  Review of Systems   Constitutional: Negative for chills and fever.   HENT: Negative for hearing loss and tinnitus.    Eyes: Negative for blurred vision, double vision and photophobia.   Respiratory: Negative for cough.    Cardiovascular: Negative for chest pain and palpitations.   Gastrointestinal: Negative for abdominal pain, heartburn and nausea.   Musculoskeletal: Positive for myalgias.   Skin: Negative for rash.   Neurological: Positive for weakness. Negative for headaches.   Psychiatric/Behavioral: Positive for memory loss.   All other systems reviewed and are negative.                Past Medical History:    Diagnosis   Date    .   Arthritis            "elbows and wrists" (11/13/2017)    .   CHF (congestive heart failure) ([place])   08/2016    .   CKD stage 3 due to type 2 diabetes mellitus ([place])   06/2016    .   Diabetes mellitus type 2 in obese ([place])        .   High cholesterol        .   Hypertension        .   Hypothyroidism        .   Morbid obesity ([place])        .   On home oxygen therapy            "3L; now only at night" (11/13/2017)    .   OSA on CPAP        .   Pneumonia   2017        "double pneumonia"    .   Pneumonia   10/2017        "while in ICU" (11/13/2017)    .   Pulmonary embolism ([place])        .   Seizure ([place])            "10/24/2017-10/28/2017; couldn't get them stopped til medically induced coma. after they brought him out he started having more; can't find RX to stop them" (11/13/2017)    .   Stroke [facility])             "scans showed several mini strokes in the past; nothing recent" (11/13/2017)                Past Surgical History:    Procedure   Laterality   Date    .   CARDIAC CATHETERIZATION       12/2016                Family History    Problem   Relation   Age of Onset    .   Alpha-1 antitrypsin deficiency   Neg Hx        .   Asthma   Neg Hx        .   Breast cancer   Neg Hx        .   Stroke   Neg Hx        .   Colon cancer   Neg Hx        .   Coronary artery disease   Neg Hx        .   COPD   Neg Hx        .   Cystic fibrosis  Neg Hx        .   Diabetes   Neg Hx        .   Deep vein thrombosis   Neg Hx        .   Hypertension   Neg Hx        .   Hyperlipidemia   Neg Hx        .   Kidney disease   Neg Hx        .   Liver disease   Neg Hx        .   Lung cancer   Neg Hx        .   Lupus   Neg Hx        .   Neurofibromatosis   Neg Hx        .   Osteoporosis   Neg Hx        .   Ovarian cancer   Neg Hx        .   Positive PPD/TB Exposure   Neg Hx        .   Prostate cancer   Neg Hx        .   Pulmonary embolism   Neg Hx        .   Pulmonary fibrosis   Neg Hx        .   Rheum arthritis   Neg Hx        .   Sarcoidosis   Neg Hx        .   Sleep apnea   Neg Hx        .   Thyroid disease   Neg Hx        .   Tuberculosis   Neg Hx        .   Tuberous sclerosis   Neg Hx              Social History:  Married. Disabled for a couple of years due to lung issues. He reports that he has never smoked. He quit smokeless tobacco use about 4 years ago.  His smokeless tobacco use included chew. He reports that he does not drink alcohol anymore. He reports that he does not use drugs.              Allergies    Allergen    Reactions    .   Montelukast   Other (See Comments)            Possibly cause headaches per spouse                 Medications Prior to Admission    Medication   Sig   Dispense   Refill    .   albuterol (PROVENTIL HFA) 108 (90 Base) MCG/ACT inhaler   Inhale 1 puff into the lungs every 6 (six) hours as needed for wheezing or shortness of breath.             Marland Kitchen   aspirin EC 81 MG tablet   Take 81 mg by mouth daily.             .   fluticasone furoate-vilanterol (BREO ELLIPTA) 200-25 MCG/INH AEPB   Inhale 1 puff into the lungs daily.             .   hydrALAZINE (APRESOLINE) 100 MG tablet   Take 100 mg  by mouth 3 (three) times daily.             Marland Kitchen   HYDROcodone-acetaminophen (NORCO/VICODIN) 5-325 MG tablet   Take 1 tablet by mouth every 8 (eight) hours as needed (pain).             .   Insulin Detemir (LEVEMIR FLEXTOUCH) 100 UNIT/ML Pen   Inject 120 Units into the skin daily.            Marland Kitchen   levothyroxine (SYNTHROID, LEVOTHROID) 50 MCG tablet   Take 50 mcg by mouth daily.             Marland Kitchen   lisinopril (PRINIVIL,ZESTRIL) 10 MG tablet   Take 10 mg by mouth daily.       11    .   metFORMIN (GLUCOPHAGE) 500 MG tablet   Take 500 mg by mouth 2 (two) times daily with a meal.        11    .   omeprazole (PRILOSEC) 20 MG capsule   Take 20 mg by mouth daily as needed (acid reflux/heartburn).             .   torsemide (DEMADEX) 20 MG tablet   Take 20 mg by mouth daily.                   Home:  Home Living  Family/patient expects to be discharged to:: Inpatient rehab  Living Arrangements: Spouse/significant other  Available Help at Discharge: Family, Available 24 hours/day  Type of Home: House  Home Equipment: None  Additional Comments: Information per chart. Pt unable to provide and family left room during evaluation.    Functional History:  Prior  Function  Level of Independence: Needs assistance  Gait / Transfers Assistance Needed: Walked without help until a few days before coming to hospital   ADL's / Homemaking Assistance Needed: Needed wife Assist about a month prior to coming to hospital  Comments: Pt was independent until 1 month ago when confusion began to increase.   Functional Status:   Mobility:  Bed Mobility  Overal bed mobility: Needs Assistance  Bed Mobility: Supine to Sit  Rolling: Mod assist  Supine to sit: Min guard  Sit to supine: Max assist, +2 for physical assistance  General bed mobility comments: increased time and effort; min guard for safety  Transfers  Overall transfer level: Needs assistance  Equipment used: Rolling walker (2 wheeled)(gait belt)  Transfers: Sit to/from Stand  Sit to Stand: Min assist, +2 physical assistance  General transfer comment: assist to power up into standing and for balance upon standing from EOB and recliner; cues for safe hand placement  Ambulation/Gait  Ambulation/Gait assistance: Mod assist, +2 safety/equipment  Gait Distance (Feet): (22ft X2)  Assistive device: Rolling walker (2 wheeled)  Gait Pattern/deviations: Step-to pattern, Decreased step length - right, Decreased step length - left, Decreased dorsiflexion - right, Decreased dorsiflexion - left, Wide base of support, Trunk flexed  General Gait Details: assistance for balance when weight shifting and to guide RW; multimodal cues for hips extension and vc for safe proximity to RW and increased bilat step lengths  Gait velocity: decreased       ADL:  ADL  Overall ADL's : Needs assistance/impaired  Grooming: Wash/dry hands, Wash/dry face, Oral care, Standing(standing in lift bed)  Grooming Details (indicate cue type and reason): increased time and encouragmenet to engage in self care tasks, at time reporting "my wife will do it  for me'  Toileting- Clothing Manipulation and Hygiene:  Total assistance, +2 for physical assistance, +2 for safety/equipment, Bed level  Toileting - Clothing Manipulation Details (indicate cue type and reason): soiled brief and bed pads  General ADL Comments: session focused on dynamic reaching with B UEs in tilt bed standing      Cognition:  Cognition  Overall Cognitive Status: Impaired/Different from baseline  Orientation Level: Oriented X4  Cognition  Arousal/Alertness: Awake/alert  Behavior During Therapy: WFL for tasks assessed/performed  Overall Cognitive Status: Impaired/Different from baseline  Area of Impairment: Attention, Following commands, Safety/judgement, Problem solving, Memory, Awareness  Orientation Level: Disoriented to, Time, Place  Current Attention Level: Selective  Memory: Decreased short-term memory  Following Commands: Follows one step commands with increased time, Follows one step commands consistently, Follows multi-step commands inconsistently  Safety/Judgement: Decreased awareness of safety, Decreased awareness of deficits  Awareness: Emergent  Problem Solving: Requires verbal cues  General Comments: Pt more alert today, very talkative and appropriately joking at times.  Completely unaware he is in the hospital.         Blood pressure (!) 110/46, pulse 86, temperature 98.9 F (37.2 C), temperature source Oral, resp. rate 20, height 5\' 9"  (1.753 m), weight 135.7 kg, SpO2 97 %.  Physical Exam   Nursing note and vitals reviewed.  Constitutional: He appears well-developed. He is sleeping and cooperative. He is easily aroused.  Morbidly obese male. NAD.    HENT:   Head: Normocephalic and atraumatic.   Eyes: EOM are normal. Right eye exhibits no discharge. Left eye exhibits no discharge.   Neck: Normal range of motion. Neck supple.   Cardiovascular: Normal rate and regular rhythm.   Respiratory: Effort normal and breath sounds normal.   GI: Soft. Bowel sounds are normal.   Musculoskeletal: He exhibits edema (2+ LUE). He exhibits no tenderness.  Neurological: He is alert and easily aroused.  Tangential and distracted.  Kept left eye closed during exam--reports "habit".   He was able to answer basic orientation questions once rapport established and redirected.  Able to follow simple motor commands.  Motor: 4-4+/5 proximal distal throughout   Skin: Skin is warm and dry.  Psychiatric: His affect is blunt. His speech is delayed.         Lab Results Last 24 Hours  Imaging Results (Last 48 hours)                    Assessment/Plan:  Diagnosis: Debility   Labs independently reviewed.  Records reviewed and summated above.     1.Does the need for close, 24 hr/day medical supervision in concert with the patient's rehab needs make it unreasonable for this patient to be served in a less intensive setting? YesCo-Morbidities requiring supervision/potential   2. complications: PE, OSA (monitor for daytime somnolence and energy levels, encouraged compliance), morbid obesity (encourage weight loss), encephalopathy with hallucinations, fevers (cont to monitor for signs and symptoms of infection, further workup  if indicated), acute on chronic renal failure (avoid nephrotoxic meds), medication noncompliance (current), uncontrolled blood glucose (Monitor in accordance with exercise and adjust meds as necessary), labile blood pressure (monitor), ABLA (transfuse if necessary to ensure appropriate perfusion for increased activity tolerance)   3.Due to safety, skin/wound care, disease management, pain management and patient education, does the patient require 24 hr/day rehab nursing? Yes   4.Does the patient require coordinated care of a physician, rehab nurse, PT (1-2 hrs/day, 5 days/week), OT (1-2 hrs/day, 5 days/week) and SLP (1-2 hrs/day, 5 days/week) to address physical and functional deficits in the context of the above medical diagnosis(es)? Yes Addressing deficits in the following areas: balance, endurance, locomotion, strength, transferring, bowel/bladder control, bathing, dressing, feeding, toileting, cognition and psychosocial support   5.Can the patient actively participate in an intensive therapy program of at least 3 hrs of therapy per day at least 5 days per week? Yes   6.The potential for patient to make measurable gains while on inpatient rehab is good   7.Anticipated functional outcomes upon discharge from inpatient rehab are supervision and min assist  with PT, supervision and min assist with OT, modified independent with SLP.   8.Estimated rehab length of stay to reach the above functional goals is: 13-16 days.   9.Anticipated D/C setting: Home   10.Anticipated post D/C treatments: HH therapy and Home excercise program   11.Overall Rehab/Functional Prognosis: good      RECOMMENDATIONS:  This patient's condition is appropriate for continued rehabilitative care in the following setting: CIR if caregiver support available and patient willing to comply with recommendations  Patient has agreed to participate in recommended program. Potentially  Note that insurance prior  authorization may be required for reimbursement for recommended care.     Comment: Rehab Admissions Coordinator to follow up.        I have personally performed a face to face diagnostic evaluation, including, but not limited to relevant history and physical exam findings, of this patient and developed relevant assessment and plan.  Additionally, I have reviewed and concur with the physician assistant's documentation above.      Delice Lesch, MD, ABPMR  Bary Leriche, PA-C  11/20/2017                Revision History                                        Routing History

## 2017-11-22 NOTE — H&P (Addendum)
Physical Medicine and Rehabilitation Admission H&P    CC: Debility due to Status epilepticus and body habitus.   HPI:  Daniel [name] is a 52 year old male with history of PE, OSA, morbid obesity, 3-4 weeks of progressive encephalopathy with hallucination and falls who was admitted to outside hospital for work-up.  History taken from chart review and wife.  He was found to have malignant hypertension with periods of unresponsiveness due to seizures and was started on Keppra and Vimpat.  He continued to have these episodes and was transferred to [facility] on 10/27/2017 for continuous EEG monitoring.  He was intubated for airway protection due to status epilepticus and neurology following for assistance.  Hospital course significant for fevers due to Haemophilus pneumonia CAP, severe hypoglycemia, dysphagia, diarrhea as well as encephalopathy.  He tolerated extubation on zero 9/13 and has been refusing to use CPAP.  He developed recurrent fevers on 9/17 and was pancultured and started on ceftriaxone empirically.  ID consulted for input and recommended discontinuation of antibiotics due to concerns of drug fever v/s related to seizures.  Left upper extremity Dopplers done and were positive for superficial thrombosis in the cephalic vein-this has been treated with local measures.  He has defervesced and mentation is improving.  He had recurrent episode of lethargy with waxing and waning of mental status.  CT of head reviewed, unremarkable for acute intracranial process.  Neurology recommended increasing fosphenytoin dose to 150 mg 3 times daily.  Status epilepticus has resolved and neurology recommends continuing Keppra, Vimpat and Dilantin as well as control of hyperglycemia.    On 9/17, He has also had issues with acute on chronic renal failure with decrease in urine output and hypernatremia.  Dr. Marval Regal felt this was due to volume depletion as well as urinary retention.  Renal ultrasound  showed echogenicity within normal limits. Lisinopril and demadex placed on hold and patient treated with IV fluids as well as has required I/O cath's due to urinary retention.  Hematuria and proteinuria felt to be due to diabetic nephropathy but recommends a work-up after discharge.  Blood sugars continue to be difficulty to control with patient reporting baseline blood sugars around 400s.  He continues to refuse to use CPAP consistently.  Inappropriate behaviors and encephalopathy is resolving.  Therapies ongoing he was noted to be severely deconditioned.  CIR recommended due to functional deficits   Review of Systems  Constitutional: Negative for chills and fever.  HENT: Negative for hearing loss and tinnitus.   Eyes: Negative for blurred vision and double vision.  Respiratory: Positive for shortness of breath. Negative for cough.   Cardiovascular: Negative for chest pain and palpitations.  Gastrointestinal: Negative for diarrhea, heartburn and nausea.  Genitourinary: Positive for frequency. Negative for urgency.  Musculoskeletal: Positive for myalgias.  Neurological: Positive for focal weakness. Negative for dizziness, sensory change and headaches.  Psychiatric/Behavioral: Positive for memory loss.  All other systems reviewed and are negative.     Past Medical History:  Diagnosis Date  . Arthritis    "elbows and wrists" (11/13/2017)  . CHF (congestive heart failure) ([place]) 08/2016  . CKD stage 3 due to type 2 diabetes mellitus ([place]) 06/2016  . Diabetes mellitus type 2 in obese ([place])   . High cholesterol   . Hypertension   . Hypothyroidism   . Morbid obesity ([place])   . On home oxygen therapy    "3L; now only at night" (11/13/2017)  . OSA on CPAP   .  Pneumonia 2017   "double pneumonia"  . Pneumonia 10/2017   "while in ICU" (11/13/2017)  . Pulmonary embolism ([place])   . Seizure ([place])    "10/24/2017-10/28/2017; couldn't get them stopped til medically induced coma. after they brought him out  he started having more; can't find RX to stop them" (11/13/2017)  . Stroke [facility])    "scans showed several mini strokes in the past; nothing recent" (11/13/2017)    Past Surgical History:  Procedure Laterality Date  . CARDIAC CATHETERIZATION  12/2016    Family History  Problem Relation Age of Onset  . Alpha-1 antitrypsin deficiency Neg Hx   . Asthma Neg Hx   . Breast cancer Neg Hx   . Stroke Neg Hx   . Colon cancer Neg Hx   . Coronary artery disease Neg Hx   . COPD Neg Hx   . Cystic fibrosis Neg Hx   . Diabetes Neg Hx   . Deep vein thrombosis Neg Hx   . Hypertension Neg Hx   . Hyperlipidemia Neg Hx   . Kidney disease Neg Hx   . Liver disease Neg Hx   . Lung cancer Neg Hx   . Lupus Neg Hx   . Neurofibromatosis Neg Hx   . Osteoporosis Neg Hx   . Ovarian cancer Neg Hx   . Positive PPD/TB Exposure Neg Hx   . Prostate cancer Neg Hx   . Pulmonary embolism Neg Hx   . Pulmonary fibrosis Neg Hx   . Rheum arthritis Neg Hx   . Sarcoidosis Neg Hx   . Sleep apnea Neg Hx   . Thyroid disease Neg Hx   . Tuberculosis Neg Hx   . Tuberous sclerosis Neg Hx     Social History: Married.  Wife disabled due to back injury and he has been out of work for a couple of years due to lung issues. Has not driven a car due to visual issues?  He quit smokeless tobacco use about 4 years ago.  His smokeless tobacco use included chew. He reports that he does not drink alcohol. He reports that he does not use drugs.    Allergies  Allergen Reactions  . Montelukast Other (See Comments)    Possibly cause headaches per spouse    Medications Prior to Admission  Medication Sig Dispense Refill  . albuterol (PROVENTIL HFA) 108 (90 Base) MCG/ACT inhaler Inhale 1 puff into the lungs every 6 (six) hours as needed for wheezing or shortness of breath.     Marland Kitchen aspirin EC 81 MG tablet Take 81 mg by mouth daily.     . fluticasone furoate-vilanterol (BREO ELLIPTA) 200-25 MCG/INH AEPB Inhale 1 puff into the lungs daily.      . hydrALAZINE (APRESOLINE) 100 MG tablet Take 100 mg by mouth 3 (three) times daily.     Marland Kitchen HYDROcodone-acetaminophen (NORCO/VICODIN) 5-325 MG tablet Take 1 tablet by mouth every 8 (eight) hours as needed (pain).     . Insulin Detemir (LEVEMIR FLEXTOUCH) 100 UNIT/ML Pen Inject 120 Units into the skin daily.    Marland Kitchen levothyroxine (SYNTHROID, LEVOTHROID) 50 MCG tablet Take 50 mcg by mouth daily.     Marland Kitchen lisinopril (PRINIVIL,ZESTRIL) 10 MG tablet Take 10 mg by mouth daily.  11  . metFORMIN (GLUCOPHAGE) 500 MG tablet Take 500 mg by mouth 2 (two) times daily with a meal.   11  . omeprazole (PRILOSEC) 20 MG capsule Take 20 mg by mouth daily as needed (acid reflux/heartburn).     Marland Kitchen  torsemide (DEMADEX) 20 MG tablet Take 20 mg by mouth daily.       Drug Regimen Review  Drug regimen was reviewed and remains appropriate with no significant issues identified  Home: Home Living Family/patient expects to be discharged to:: Inpatient rehab Living Arrangements: Spouse/significant other Available Help at Discharge: Family, Available 24 hours/day Type of Home: House Home Equipment: None Additional Comments: Information per chart. Pt unable to provide and family left room during evaluation.    Functional History: Prior Function Level of Independence: Needs assistance Gait / Transfers Assistance Needed: Walked without help until a few days before coming to hospital  ADL's / Homemaking Assistance Needed: Needed wife Assist about a month prior to coming to hospital Comments: Pt was independent until 1 month ago when confusion began to increase.   Functional Status:  Mobility: Bed Mobility Overal bed mobility: Needs Assistance Bed Mobility: Supine to Sit Rolling: Mod assist Supine to sit: Min assist Sit to supine: Min assist General bed mobility comments: increased time and effort; min guard for safety Transfers Overall transfer level: Needs assistance Equipment used: Rolling walker (2 wheeled)(gait  belt) Transfers: Sit to/from Stand Sit to Stand: Min assist, +2 physical assistance General transfer comment: assist to power up into standing and for balance upon standing from EOB and recliner; cues for safe hand placement Ambulation/Gait Ambulation/Gait assistance: Mod assist, +2 safety/equipment Gait Distance (Feet): (39f X2) Assistive device: Rolling walker (2 wheeled) Gait Pattern/deviations: Step-to pattern, Decreased step length - right, Decreased step length - left, Decreased dorsiflexion - right, Decreased dorsiflexion - left, Wide base of support, Trunk flexed General Gait Details: assistance for balance when weight shifting and to guide RW; multimodal cues for hips extension and vc for safe proximity to RW and increased bilat step lengths Gait velocity: decreased    ADL: ADL Overall ADL's : Needs assistance/impaired Grooming: Wash/dry hands, Wash/dry face, Set up, Sitting Grooming Details (indicate cue type and reason): increased time and encouragmenet to engage in self care tasks, at time reporting "my wife will do it for me' Upper Body Dressing : Minimal assistance(donning gown to wear as a robe) Toileting- Clothing Manipulation and Hygiene: Total assistance, +2 for physical assistance, +2 for safety/equipment, Bed level Toileting - Clothing Manipulation Details (indicate cue type and reason): soiled brief and bed pads Functional mobility during ADLs: Minimal assistance General ADL Comments: PATIENT WAS MIN A WITH SIT TO STAND FROM CHIAR AND SARA LIFT.   Cognition: Cognition Overall Cognitive Status: Impaired/Different from baseline Orientation Level: Disoriented to place Cognition Arousal/Alertness: Awake/alert Behavior During Therapy: WFL for tasks assessed/performed Overall Cognitive Status: Impaired/Different from baseline Area of Impairment: Attention, Following commands, Safety/judgement, Problem solving, Memory, Awareness Orientation Level: Disoriented to, Time,  Place Current Attention Level: Selective Memory: Decreased short-term memory Following Commands: Follows one step commands consistently Safety/Judgement: Decreased awareness of safety, Decreased awareness of deficits Awareness: Emergent Problem Solving: Requires verbal cues General Comments: Pt more alert today, very talkative and appropriately joking at times.  Completely unaware he is in the hospital.    Blood pressure (!) 147/76, pulse 77, temperature 98.2 F (36.8 C), temperature source Oral, resp. rate 20, height 5' 9"  (1.753 m), weight 134.4 kg, SpO2 98 %. Physical Exam  Nursing note and vitals reviewed. Constitutional: He appears well-developed.  Morbidly obese male, NAD  HENT:  Head: Atraumatic.  Eyes: EOM are normal. Right eye exhibits no discharge. Left eye exhibits no discharge.  Neck: Normal range of motion. Neck supple.  Cardiovascular: Normal rate and  regular rhythm.  Respiratory: Effort normal and breath sounds normal.  GI: Soft. Bowel sounds are normal.  Musculoskeletal:   Lower extremity edema Neurological:  A&O x2 Tangential and needed redirection frequently.  Memory deficits with paranoia and distrust of information. Wife reports cognition continues to fluctuates with bouts of confusion and irrational speech.  Keeps left eye closed Motor: RUE/RLE: 5/5 proximal distal LUE: 4+/5 proximal distal LLE: Hip flexion, knee extension 4/5, ankle dorsiflexion 3+/5  Sensation diminished to light touch bilateral feet Skin: Skin is warm and dry.  Psychiatric: His affect is blunt. Cognition and memory are impaired.    Results for orders placed or performed during the hospital encounter of 10/27/17 (from the past 48 hour(s))  Glucose, capillary     Status: Abnormal   Collection Time: 11/20/17  4:10 PM  Result Value Ref Range   Glucose-Capillary 286 (H) 70 - 99 mg/dL   Comment 1 Notify RN    Comment 2 Document in Chart   Glucose, capillary     Status: Abnormal    Collection Time: 11/20/17 10:30 PM  Result Value Ref Range   Glucose-Capillary 240 (H) 70 - 99 mg/dL   Comment 1 Notify RN    Comment 2 Document in Chart   Glucose, capillary     Status: Abnormal   Collection Time: 11/21/17  7:00 AM  Result Value Ref Range   Glucose-Capillary 189 (H) 70 - 99 mg/dL   Comment 1 Notify RN    Comment 2 Document in Chart   Basic metabolic panel     Status: Abnormal   Collection Time: 11/21/17  8:05 AM  Result Value Ref Range   Sodium 136 135 - 145 mmol/L   Potassium 3.9 3.5 - 5.1 mmol/L   Chloride 103 98 - 111 mmol/L   CO2 25 22 - 32 mmol/L   Glucose, Bld 200 (H) 70 - 99 mg/dL   BUN 13 6 - 20 mg/dL   Creatinine, Ser 1.17 0.61 - 1.24 mg/dL   Calcium 8.0 (L) 8.9 - 10.3 mg/dL   GFR calc non Af Amer >60 >60 mL/min   GFR calc Af Amer >60 >60 mL/min    Comment: (NOTE) The eGFR has been calculated using the CKD EPI equation. This calculation has not been validated in all clinical situations. eGFR's persistently <60 mL/min signify possible Chronic Kidney Disease.    Anion gap 8 5 - 15    Comment: Performed at [facility] [address] [place], [place] [address]  CBC     Status: Abnormal   Collection Time: 11/21/17  8:05 AM  Result Value Ref Range   WBC 5.1 4.0 - 10.5 K/uL   RBC 4.59 4.22 - 5.81 MIL/uL   Hemoglobin 12.7 (L) 13.0 - 17.0 g/dL   HCT 40.9 39.0 - 52.0 %   MCV 89.1 78.0 - 100.0 fL   MCH 27.7 26.0 - 34.0 pg   MCHC 31.1 30.0 - 36.0 g/dL   RDW 15.4 11.5 - 15.5 %   Platelets 225 150 - 400 K/uL    Comment: Performed at [facility], [place] [address], [place], [place] [address]  Phenytoin level, total     Status: Abnormal   Collection Time: 11/21/17  8:05 AM  Result Value Ref Range   Phenytoin Lvl 3.0 (L) 10.0 - 20.0 ug/mL    Comment: Performed at [facility] [address] [place], [place] [address]  Glucose, capillary     Status:  Abnormal   Collection Time: 11/21/17 11:17 AM  Result Value Ref Range    Glucose-Capillary 197 (H) 70 - 99 mg/dL  TSH     Status: None   Collection Time: 11/21/17 12:42 PM  Result Value Ref Range   TSH 2.098 0.350 - 4.500 uIU/mL    Comment: Performed by a 3rd Generation assay with a functional sensitivity of <=0.01 uIU/mL. Performed at [facility], [place] [address], [place], [place] [address]   Hepatic function panel     Status: Abnormal   Collection Time: 11/21/17 12:42 PM  Result Value Ref Range   Total Protein 6.1 (L) 6.5 - 8.1 g/dL   Albumin 2.5 (L) 3.5 - 5.0 g/dL   AST 16 15 - 41 U/L   ALT 16 0 - 44 U/L   Alkaline Phosphatase 73 38 - 126 U/L   Total Bilirubin 0.4 0.3 - 1.2 mg/dL   Bilirubin, Direct 0.2 0.0 - 0.2 mg/dL   Indirect Bilirubin 0.2 (L) 0.3 - 0.9 mg/dL    Comment: Performed at [facility] [address], [place], [place] [address]  Ammonia     Status: None   Collection Time: 11/21/17 12:42 PM  Result Value Ref Range   Ammonia 33 9 - 35 umol/L    Comment: Performed at [facility], [place] [address] [place], [place] [address]  Blood gas, venous     Status: Abnormal   Collection Time: 11/21/17 12:45 PM  Result Value Ref Range   pH, Ven 7.419 7.250 - 7.430   pCO2, Ven 53.0 44.0 - 60.0 mmHg   pO2, Ven 32.7 32.0 - 45.0 mmHg   Bicarbonate 33.6 (H) 20.0 - 28.0 mmol/L   Acid-Base Excess 8.9 (H) 0.0 - 2.0 mmol/L   O2 Saturation 59.7 %   Patient temperature 98.6    Collection site LEFT BRACHIAL    Drawn by DRAWN BY RN    Sample type VENOUS   Glucose, capillary     Status: Abnormal   Collection Time: 11/21/17  4:46 PM  Result Value Ref Range   Glucose-Capillary 333 (H) 70 - 99 mg/dL   Comment 1 Notify RN    Comment 2 Document in Chart   Glucose, capillary     Status: Abnormal   Collection Time: 11/21/17 10:09 PM  Result Value Ref Range   Glucose-Capillary 194 (H) 70 - 99 mg/dL   Comment 1 Notify RN    Comment 2 Document in Chart   CBC     Status: Abnormal   Collection Time: 11/22/17  5:51 AM  Result Value Ref Range     WBC 6.3 4.0 - 10.5 K/uL   RBC 4.52 4.22 - 5.81 MIL/uL   Hemoglobin 12.8 (L) 13.0 - 17.0 g/dL   HCT 40.3 39.0 - 52.0 %   MCV 89.2 78.0 - 100.0 fL   MCH 28.3 26.0 - 34.0 pg   MCHC 31.8 30.0 - 36.0 g/dL   RDW 15.3 11.5 - 15.5 %   Platelets 312 150 - 400 K/uL    Comment: Performed at [facility], [place] [address] [place],  [address]  Basic metabolic panel     Status: Abnormal   Collection Time: 11/22/17  5:51 AM  Result Value Ref Range   Sodium 137 135 - 145 mmol/L   Potassium 3.9 3.5 - 5.1 mmol/L   Chloride 99 98 - 111 mmol/L   CO2 30 22 - 32 mmol/L   Glucose,  Bld 220 (H) 70 - 99 mg/dL   BUN 12 6 - 20 mg/dL   Creatinine, Ser 1.22 0.61 - 1.24 mg/dL   Calcium 8.5 (L) 8.9 - 10.3 mg/dL   GFR calc non Af Amer >60 >60 mL/min   GFR calc Af Amer >60 >60 mL/min    Comment: (NOTE) The eGFR has been calculated using the CKD EPI equation. This calculation has not been validated in all clinical situations. eGFR's persistently <60 mL/min signify possible Chronic Kidney Disease.    Anion gap 8 5 - 15    Comment: Performed at [facility] [address], [place], [place] [address]  Magnesium     Status: None   Collection Time: 11/22/17  5:51 AM  Result Value Ref Range   Magnesium 1.9 1.7 - 2.4 mg/dL    Comment: Performed at [facility] [address] [place], [place] [address]  Glucose, capillary     Status: Abnormal   Collection Time: 11/22/17  6:30 AM  Result Value Ref Range   Glucose-Capillary 173 (H) 70 - 99 mg/dL   Comment 1 Notify RN    Comment 2 Document in Chart   Glucose, capillary     Status: Abnormal   Collection Time: 11/22/17 11:51 AM  Result Value Ref Range   Glucose-Capillary 252 (H) 70 - 99 mg/dL   No results found.     Medical Problem List and Plan: 1.  Weakness, limitation self-care secondary to debility. 2.  DVT Prophylaxis/Anticoagulation: Pharmaceutical: Lovenox 3. Pain Management: tylenol prn.  4. Mood: LCSW to follow for  evaluation and support.  5. Neuropsych: This patient is not capable of making decisions on his own behalf. 6. Skin/Wound Care:  Routine pressure relief measures.  7. Fluids/Electrolytes/Nutrition: Strict I/O. Monitor weights daily. Low salt diet.  8.  T2DM: Educated patient on dietary restrictions.  Monitor blood sugars AC at bedtime.  Titrate medications for tighter control 9.  HTN: Monitor blood pressures twice daily.  10.  Acute on chronic renal failure: Renal status improved from BUN/[place] 50/2.26--> 12/1.22.  Will need work-up on hematuria and proteinuria after discharge. 11.  New onset seizures: Continue Keppra, Vimpat, Dilantin.  LFTs within normal limits Dilantin level low at 3.0--albumin 2.5-->corrects to 5.4. Will have pharmacy follow for assistance/adjustment of dose.   12.  Fluid overload: Resolving--likely due to illness and hypoalbuminemia. Continue to monitor daily weights. Will change glucerna to prostat to avoid hyperglycemia.  13 Obesity hypoventilation syndrome: Encourage CPAP use at bedtime 14. Morbid Obesity: will consult dietician for dietary education.discussed importance of weight loss and activity to help promote health and mobility. Will need reinforcement.     Post Admission Physician Evaluation: 1. Preadmission assessment reviewed and changes made below. 2. Functional deficits secondary  to debility. 3. Patient is admitted to receive collaborative, interdisciplinary care between the physiatrist, rehab nursing staff, and therapy team. 4. Patient's level of medical complexity and substantial therapy needs in context of that medical necessity cannot be provided at a lesser intensity of care such as a SNF. 5. Patient has experienced substantial functional loss from his/her baseline which was documented above under the "Functional History" and "Functional Status" headings.  Judging by the patient's diagnosis, physical exam, and functional history, the patient has potential for  functional progress which will result in measurable gains while on inpatient rehab.  These gains will be of substantial and practical use upon discharge  in facilitating mobility and self-care at the household level. 6.  Physiatrist will provide 24 hour management of medical needs as well as oversight of the therapy plan/treatment and provide guidance as appropriate regarding the interaction of the two. 7. 24 hour rehab nursing will assist with safety, disease management, medication administration and patient education  and help integrate therapy concepts, techniques,education, etc. 8. PT will assess and treat for/with: Lower extremity strength, range of motion, stamina, balance, functional mobility, safety, adaptive techniques and equipment, coping skills, pain control, education. Goals are: Supervision/min a. 9. OT will assess and treat for/with: ADL's, functional mobility, safety, upper extremity strength, adaptive techniques and equipment, wound mgt, ego support, and community reintegration.   Goals are: Supervision/min a. Therapy may proceed with showering this patient. 10. SLP will assess and treat for/with: Language, cognition.  Goals are: Supervision. 11. Case Management and Social Worker will assess and treat for psychological issues and discharge planning. 12. Team conference will be held weekly to assess progress toward goals and to determine barriers to discharge. 13. Patient will receive at least 3 hours of therapy per day at least 5 days per week. 14. ELOS: 9-13 days.       15. Prognosis:  good  I have personally performed a face to face diagnostic evaluation, including, but not limited to relevant history and physical exam findings, of this patient and developed relevant assessment and plan.  Additionally, I have reviewed and concur with the physician assistant's documentation above.  The patient's status has not changed. The original post admission physician evaluation remains  appropriate, and any changes from the pre-admission screening or documentation from the acute chart are noted above.    Delice Lesch, MD, ABPMR Bary Leriche, PA-C 11/22/2017

## 2017-11-22 NOTE — Progress Notes (Signed)
CSW alerted that patient being admitted to CIR Today. No further CSW needs at this time, signing off.  Laveda Abbe, [place] Clinical Social Worker [phone]

## 2017-11-22 NOTE — Progress Notes (Signed)
Pt discharge to CIR and report called off to nurse Kaylor on Rehab. Pt to be transported off unit via bed with spouse and belongings to the side. Delia Heady RN

## 2017-11-22 NOTE — PMR Pre-admission (Signed)
PMR Admission Coordinator Pre-Admission Assessment  Patient: Daniel [name] is an 52 y.o., male MRN: [id number] DOB: [birth year] Height: 5\' 9"  (175.3 cm) Weight: 134.4 kg              Insurance Information PRIMARY: uninsured        Medicaid Application Date:       Case Manager:  Disability Application Date:       Case Worker:   Wife had applied for Medicaid MAD 9/6 with Leigh Casaus at [phone]. I left her a voicemail on 10/1 to contact me to discuss current status of application. Pt has been with long term disability with his company and lost his BCBS coverage pta. He has not been able to afford his insulin pta  Emergency Contact Information Contact Information    Name Relation Home Work [place], [place] Spouse [phone]  [phone]   Elmer Sow Sister  [place]   Philomena Course Mother [phone]  [phone]     Current Medical History  Patient Admitting Diagnosis: debility  History of Present Illness:HPI:  Daniel [name] is a 52 year old male with history of PE, OSA, morbid obesity, 3-4 weeks of progressive encephalopathy with hallucination and falls who was admitted to outside hospital for work-up.  He was found to have malignant hypertension with periods of unresponsiveness due to seizures and was started on Keppra and Vimpat.  He continued to have these episodes and was transferred to [facility] on 10/27/2017 for continuous EEG monitoring.  He was intubated for airway protection due to status epilepticus and neurology following for assistance.  Hospital course significant for fevers due to Haemophilus pneumonia CAP, severe hypoglycemia, dysphagia, diarrhea as well as encephalopathy.  He tolerated extubation on zero 9/13 and has been refusing to use CPAP.  He developed recurrent fevers on 9/17 and was pan cultured and started on ceftriaxone empirically.  ID consulted for input and recommended discontinuation of antibiotics due to concerns of  drug fever v/s related to seizures.  Left upper extremity Dopplers done and were positive for superficial thrombosis in the cephalic vein-this has been treated with local measures.  He has effervesced and mentation is improving.  He had recurrent episode of lethargy with waxing and waning of mental status.  CT of head negative and neurology recommended increasing fosphenytoin dose to 150 mg 3 times daily.   He has also had issues with acute on chronic renal failure with decreasing urine output and hyponatremia.   Nephrology felt this was due to volume depletion as well as urinary retention.  Lisinopril DC'd and patient treated with IV fluids as well as Foley.    Blood sugars continue to be poorly controlled with patient reporting baseline blood sugars being greater than 400's.  Past Medical History  Past Medical History:  Diagnosis Date  . Arthritis    "elbows and wrists" (11/13/2017)  . CHF (congestive heart failure) ([place]) 08/2016  . CKD stage 3 due to type 2 diabetes mellitus ([place]) 06/2016  . Diabetes mellitus type 2 in obese ([place])   . High cholesterol   . Hypertension   . Hypothyroidism   . Morbid obesity ([place])   . On home oxygen therapy    "3L; now only at night" (11/13/2017)  . OSA on CPAP   . Pneumonia 2017   "double pneumonia"  . Pneumonia 10/2017   "while in ICU" (11/13/2017)  . Pulmonary embolism ([place])   . Seizure ([place])    "10/24/2017-10/28/2017; couldn't  get them stopped til medically induced coma. after they brought him out he started having more; can't find RX to stop them" (11/13/2017)  . Stroke [facility])    "scans showed several mini strokes in the past; nothing recent" (11/13/2017)    Family History  family history is not on file.  Prior Rehab/Hospitalizations:  Has the patient had major surgery during 100 days prior to admission? No  Current Medications   Current Facility-Administered Medications:  .  acetaminophen (TYLENOL) tablet 650 mg, 650 mg, Oral, Q6H PRN, 650 mg  at 11/21/17 2332 **OR** acetaminophen (TYLENOL) suppository 650 mg, 650 mg, Rectal, Q6H PRN, Vance Gather B, MD, 650 mg at 11/07/17 1650 .  albuterol (PROVENTIL) (2.5 MG/3ML) 0.083% nebulizer solution 3 mL, 3 mL, Inhalation, Q4H PRN, Patrecia Pour, MD, 3 mL at 11/18/17 0929 .  aspirin chewable tablet 81 mg, 81 mg, Oral, Daily, Patrecia Pour, MD, 81 mg at 11/22/17 1036 .  bisacodyl (DULCOLAX) suppository 10 mg, 10 mg, Rectal, Daily PRN, Vance Gather B, MD .  budesonide (PULMICORT) nebulizer solution 0.25 mg, 0.25 mg, Nebulization, BID, Vance Gather B, MD, 0.25 mg at 11/22/17 0832 .  chlorhexidine (PERIDEX) 0.12 % solution 15 mL, 15 mL, Mouth Rinse, BID, Patrecia Pour, MD, 15 mL at 11/21/17 2139 .  diclofenac sodium (VOLTAREN) 1 % transdermal gel 4 g, 4 g, Topical, QID PRN, Bodenheimer, Charles A, NP, 4 g at 11/19/17 1016 .  diphenhydrAMINE (BENADRYL) capsule 25 mg, 25 mg, Oral, Q6H PRN, Elodia Florence., MD, 25 mg at 11/22/17 0001 .  feeding supplement (ENSURE ENLIVE) (ENSURE ENLIVE) liquid 237 mL, 237 mL, Oral, BID BM, Vance Gather B, MD, 237 mL at 11/22/17 1041 .  heparin injection 5,000 Units, 5,000 Units, Subcutaneous, Q8H, Vance Gather B, MD, 5,000 Units at 11/22/17 0546 .  hydrALAZINE (APRESOLINE) injection 10 mg, 10 mg, Intravenous, Q4H PRN, Vance Gather B, MD, 10 mg at 11/15/17 0411 .  hydrALAZINE (APRESOLINE) tablet 100 mg, 100 mg, Oral, Q8H, Vance Gather B, MD, 100 mg at 11/22/17 0546 .  insulin aspart (novoLOG) injection 0-20 Units, 0-20 Units, Subcutaneous, TID WC, Elodia Florence., MD, 4 Units at 11/22/17 0820 .  insulin aspart (novoLOG) injection 0-5 Units, 0-5 Units, Subcutaneous, QHS, Elodia Florence., MD, Stopped at 11/21/17 2226 .  insulin aspart (novoLOG) injection 4 Units, 4 Units, Subcutaneous, TID WC, Elodia Florence., MD, 4 Units at 11/21/17 1700 .  insulin glargine (LANTUS) injection 20 Units, 20 Units, Subcutaneous, QHS, Elodia Florence., MD, 20 Units at  11/21/17 2150 .  labetalol (NORMODYNE,TRANDATE) injection 10 mg, 10 mg, Intravenous, Q2H PRN, Patrecia Pour, MD, 10 mg at 11/04/17 1205 .  lacosamide (VIMPAT) tablet 200 mg, 200 mg, Oral, BID, Elodia Florence., MD, 200 mg at 11/22/17 1036 .  levETIRAcetam (KEPPRA) tablet 1,000 mg, 1,000 mg, Oral, BID, Elodia Florence., MD, 1,000 mg at 11/22/17 1036 .  levothyroxine (SYNTHROID, LEVOTHROID) tablet 50 mcg, 50 mcg, Oral, QAC breakfast, Patrecia Pour, MD, 50 mcg at 11/22/17 0824 .  MEDLINE mouth rinse, 15 mL, Mouth Rinse, q12n4p, Vance Gather B, MD, 15 mL at 11/21/17 1600 .  metoprolol succinate (TOPROL-XL) 24 hr tablet 50 mg, 50 mg, Oral, Daily, Vance Gather B, MD, 50 mg at 11/22/17 1037 .  pantoprazole (PROTONIX) EC tablet 40 mg, 40 mg, Oral, Daily, Vance Gather B, MD, 40 mg at 11/22/17 1036 .  phenytoin (DILANTIN) ER capsule 300 mg,  300 mg, Oral, QHS, Bertis Ruddy, RPH, 300 mg at 11/21/17 2140 .  phenytoin (DILANTIN) ER capsule 400 mg, 400 mg, Oral, q morning - 10a, Bertis Ruddy, RPH, 400 mg at 11/22/17 1036 .  RESOURCE THICKENUP CLEAR, , Oral, PRN, Vance Gather B, MD .  sodium chloride flush (NS) 0.9 % injection 10-40 mL, 10-40 mL, Intracatheter, Q12H, Vance Gather B, MD, 10 mL at 11/22/17 1044 .  sodium chloride flush (NS) 0.9 % injection 10-40 mL, 10-40 mL, Intracatheter, PRN, Patrecia Pour, MD, 40 mL at 11/20/17 0845  Patients Current Diet:  Diet Order            Diet regular Room service appropriate? Yes; Fluid consistency: Thin  Diet effective now              Precautions / Restrictions Precautions Precautions: Fall Precaution Comments: NG tube Restrictions Weight Bearing Restrictions: No   Has the patient had 2 or more falls or a fall with injury in the past year?Yes over past month pta with gradual functional and cognitive decline.  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 None of the above  Prior Functional Level Prior Function Level of Independence: Independent Gait / Transfers Assistance Needed: Walked without help until a few days before coming to hospital  ADL's / Homemaking Assistance Needed: Needed wife Assist about a month prior to coming to hospital Comments: Pt was independent until 1 month ago when confusion began to increase.   Self Care: Did the patient need help bathing, dressing, using the toilet or eating?  Independent  Indoor Mobility: Did the patient need assistance with walking from room to room (with or without device)? Independent  Stairs: Did the patient need assistance with internal or external stairs (with or without device)? Independent  Functional Cognition: Did the patient need help planning regular tasks such as shopping or remembering to take medications? Independent  Current Functional Level Cognition  Overall Cognitive Status: Impaired/Different from baseline Current Attention Level: Selective Orientation Level: Disoriented to place Following Commands: Follows one step commands consistently Safety/Judgement: Decreased awareness of safety, Decreased awareness of deficits General Comments: Pt more alert today, very talkative and appropriately joking at times.  Completely unaware he is in the hospital.     Extremity Assessment (includes Sensation/Coordination)  Upper Extremity Assessment: (limited B shoulder ROM and edema in B hands) RUE Deficits / Details: 2-/5 strength with significant edema LUE Deficits / Details: 3-/5 strength with edema noted.   Lower Extremity Assessment: Defer to PT evaluation RLE Deficits / Details: grossly 2-/5 LLE Deficits / Details: grossly 2-/5     ADLs  Overall ADL's : Needs assistance/impaired Grooming: Wash/dry hands, Wash/dry face, Set up, Sitting Grooming Details (indicate cue type and reason): increased time and encouragmenet to engage in self care tasks, at time reporting "my wife will do it for me' Upper Body Dressing : Minimal assistance(donning gown to wear as a robe) Toileting- Clothing Manipulation and Hygiene: Total assistance, +2 for physical assistance, +2 for safety/equipment, Bed level Toileting - Clothing Manipulation Details (indicate cue type and reason): soiled brief and bed pads Functional mobility during ADLs: Minimal assistance General ADL Comments: PATIENT WAS MIN A WITH SIT TO STAND FROM CHIAR AND  SARA LIFT.     Mobility  Overal bed mobility: Needs Assistance Bed Mobility: Supine to Sit Rolling: Mod assist Supine to sit: Min assist Sit to supine: Min assist General bed mobility comments: increased time and effort; min guard for safety    Transfers  Overall transfer level: Needs assistance Equipment used: Rolling walker (2 wheeled)(gait belt) Transfers: Sit to/from Stand Sit to Stand: Min assist, +2 physical assistance General transfer comment: assist to power up into standing and for balance upon standing from EOB and recliner; cues for safe hand placement    Ambulation / Gait / Stairs / Wheelchair Mobility  Ambulation/Gait Ambulation/Gait assistance: Mod assist, +2 safety/equipment Gait Distance (Feet): (46ft X2) Assistive device: Rolling walker (2 wheeled) Gait Pattern/deviations: Step-to pattern, Decreased step length - right, Decreased step length - left, Decreased dorsiflexion - right, Decreased dorsiflexion - left, Wide base of support, Trunk flexed General Gait Details: assistance for balance when weight shifting and to guide RW; multimodal cues for hips extension and vc for safe proximity to RW and increased bilat step lengths Gait velocity: decreased    Posture / Balance Dynamic Sitting  Balance Sitting balance - Comments: pt able to sit EOB with grossly min guard/min A  Balance Overall balance assessment: Needs assistance Sitting-balance support: Feet supported, Bilateral upper extremity supported Sitting balance-Leahy Scale: Fair Sitting balance - Comments: pt able to sit EOB with grossly min guard/min A  Standing balance support: Bilateral upper extremity supported Standing balance-Leahy Scale: Poor Standing balance comment: stood in tilt bed, challenge dynamic reaching with B UES and midline positioning     Special needs/care consideration BiPAP/CPAP refuses to use CPM n/a Continuous Drip IV n/a Dialysis n/a Life Vest n/a Oxygen n/a Special Bed n/a; seizure precautions Trach Size n/a Wound Vac n/a Skin intact Bowel mgmt: continent LBM 10/1 Bladder mgmt: external catheter Diabetic mgmt Hgb A1c 15.8; has had difficulty over past month purchasing insulin due to loss of his [employer] insurance 1 month pta   Previous Home Environment Living Arrangements: Spouse/significant other  Lives With: Spouse Available Help at Discharge: Family, Available 24 hours/day Type of Home: House Home Layout: One level Home Access: Stairs to enter Entrance Stairs-Rails: Right, Left, Can reach both Entrance Stairs-Number of Steps: 3 to 4 steps Bathroom Shower/Tub: Tub/shower unit, Architectural technologist: Standard Bathroom Accessibility: Yes How Accessible: Accessible via walker Home Care Services: No Additional Comments: wife verified info pta  Discharge Living Setting Plans for Discharge Living Setting: Patient's home, Lives with (comment)(spouse) Type of Home at Discharge: House Discharge Home Layout: One level Discharge Home Access: Stairs to enter Entrance Stairs-Rails: Right, Left, Can reach both Entrance Stairs-Number of Steps: 3 to 4 Discharge Bathroom Shower/Tub: Tub/shower unit, Curtain Discharge Bathroom Toilet: Standard Discharge Bathroom Accessibility: Yes How  Accessible: Accessible via walker Does the patient have any problems obtaining your medications?: Yes (Describe)(lost his BCBS insurance last month and unable to affor his i)  Producer, television/film/video Systems Patient Roles: Spouse, Parent Contact Information: wife, Wells Guiles at [phone] Anticipated Caregiver: wife Anticipated Caregiver's Contact Information: see above Ability/Limitations of Caregiver: no limitations Caregiver Availability: 24/7 Discharge Plan Discussed with Primary Caregiver: Yes Is Caregiver In Agreement with Plan?: Yes Does Caregiver/Family have Issues with Lodging/Transportation while Pt is in Rehab?: No(wife staying alot with him in hospital; asking about meal vo)  Goals/Additional Needs Patient/Family Goal for Rehab: supervision to min assist PT and OT, Mod I SLP Expected length of stay: [facility] 10 to 14; due to uninsured, pt want to go  home asap Equipment Needs: seizure precautions Pt/Family Agrees to Admission and willing to participate: Yes Program Orientation Provided & Reviewed with Pt/Caregiver Including Roles  & Responsibilities: Yes  Barriers to Discharge: Insurance for SNF coverage  Decrease burden of Care through IP rehab admission: n/a  Possible need for SNF placement upon discharge: not anticipated  Patient Condition: This patient's condition remains as documented in the consult dated 11/20/2017, in which the Rehabilitation Physician determined and documented that the patient's condition is appropriate for intensive rehabilitative care in an inpatient rehabilitation facility. Will admit to inpatient rehab today.  Preadmission Screen Completed By:  Cleatrice Burke, 11/22/2017 12:21 PM ______________________________________________________________________   Discussed status with Dr. Posey Pronto on 11/22/2017 at  1227 and received telephone approval for admission today.  Admission Coordinator:  Cleatrice Burke, time 7195 Date 11/22/2017

## 2017-11-22 NOTE — Care Management Note (Signed)
Case Management Note  Patient Details  Name: Daniel [name] MRN: [id number] Date of Birth: May 16, 1965  Subjective/Objective:                    Action/Plan: Patient is discharging to CIR today. CM signing off.  Expected Discharge Date:  11/22/17               Expected Discharge Plan:  Skilled Nursing Facility  In-House Referral:  Clinical Social Work  Discharge planning Services  CM Consult  Post Acute Care Choice:    Choice offered to:     DME Arranged:    DME Agency:     HH Arranged:    [place] Agency:     Status of Service:  Completed, signed off  If discussed at [employer], dates discussed:    Additional Comments:  Pollie Friar, RN 11/22/2017, 2:12 PM

## 2017-11-22 NOTE — Plan of Care (Signed)
  Problem: Consults Goal: Diabetes Guidelines if Diabetic/Glucose > 140 Description If diabetic or lab glucose is > 140 mg/dl - Initiate Diabetes/Hyperglycemia Guidelines & Document Interventions  Outcome: Not Progressing   Problem: RH BOWEL ELIMINATION Goal: RH STG MANAGE BOWEL WITH ASSISTANCE Description STG Manage Bowel with min Assistance.  Outcome: Not Progressing Goal: RH STG MANAGE BOWEL W/MEDICATION W/ASSISTANCE Description STG Manage Bowel with Medication with min Assistance.  Outcome: Not Progressing   Problem: RH BLADDER ELIMINATION Goal: RH STG MANAGE BLADDER WITH ASSISTANCE Description STG Manage Bladder With min Assistance  Outcome: Not Progressing   Problem: RH SKIN INTEGRITY Goal: RH STG SKIN FREE OF INFECTION/BREAKDOWN Outcome: Not Progressing   Problem: RH SAFETY Goal: RH STG ADHERE TO SAFETY PRECAUTIONS W/ASSISTANCE/DEVICE Description STG Adhere to Safety Precautions With min-mod Assistance/Device.  Outcome: Not Progressing Goal: RH STG DECREASED RISK OF FALL WITH ASSISTANCE Description STG Decreased Risk of Fall With min-mod Assistance.  Outcome: Not Progressing   Problem: RH PAIN MANAGEMENT Goal: RH STG PAIN MANAGED AT OR BELOW PT'S PAIN GOAL Outcome: Not Progressing   Problem: RH KNOWLEDGE DEFICIT GENERAL Goal: RH STG INCREASE KNOWLEDGE OF SELF CARE AFTER HOSPITALIZATION Outcome: Not Progressing  New admit,

## 2017-11-22 NOTE — Progress Notes (Signed)
Inpatient Diabetes Program Recommendations  AACE/ADA: New Consensus Statement on Inpatient Glycemic Control (2015)  Target Ranges:  Prepandial:   less than 140 mg/dL      Peak postprandial:   less than 180 mg/dL (1-2 hours)      Critically ill patients:  140 - 180 mg/dL   Lab Results  Component Value Date   GLUCAP 173 (H) 11/22/2017   HGBA1C 15.8 (H) 10/28/2017    Review of Glycemic Control Results for TARREN, Daniel [name] (MRN [id number]) as of 11/22/2017 11:09  Ref. Range 11/21/2017 11:17 11/21/2017 16:46 11/21/2017 22:09 11/22/2017 06:30  Glucose-Capillary Latest Ref Range: 70 - 99 mg/dL 197 (H) 333 (H) 194 (H) 173 (H)   Diabetes history:Type 2 DM Outpatient Diabetes medications:Levemir 120 units daily, Metformin 500 mg bid Current orders for Inpatient glycemic control: Novolog resistant tid with meals and HS, Lantus 20 units q HS, Novolog 4 units TID, Novolog 0-5 units QHS  Inpatient Diabetes Program Recommendations:  Consider increasing Lantus to 25 units QHS and increasing Novolog 6 units TID (assuming that patient is consuming >50% of meal).  Per note from 10/1: "Patient admits to not taking insulin as prescribed due to cost.Patient will need close outpatient follow up to improve glycemic control with PCP and to have someone helping him with injections (patient confirms that [place], wife will help). In preparation for discharge, recommend Novolin 70/30 BID. Patient states that this is an affordable option."  Thanks, Bronson Curb, MSN, RNC-OB Diabetes Coordinator [phone] (8a-5p)

## 2017-11-22 NOTE — IPOC Note (Signed)
Overall Plan of Care [facility]) Patient Details Name: Daniel [name] MRN: [id number] DOB: [birth year]  Admitting Diagnosis: [facility] Problems: Active Problems:   Debility   Morbid obesity ([place])   Hypervolemia   Seizures (HCC)   Hyponatremia   Stage 2 chronic kidney disease     Functional Problem List: Nursing Behavior, Bladder, Bowel, Medication Management, Nutrition, Pain, Safety, Skin Integrity  PT Balance, Behavior, Edema, Endurance, Perception, Safety, Sensory, Skin Integrity  OT Balance, Cognition, Endurance, Pain, Safety  SLP Cognition  TR         Basic ADL's: OT Grooming, Bathing, Dressing, Toileting     Advanced  ADL's: OT Simple Meal Preparation     Transfers: PT Bed Mobility, Bed to Chair, Car, Floor, Manufacturing systems engineer, Metallurgist: PT Ambulation, Emergency planning/management officer, Stairs     Additional Impairments: OT None  SLP Social Cognition   Problem Solving, Memory, Attention, Awareness  TR      Anticipated Outcomes Item Anticipated Outcome  Self Feeding independent  Swallowing      Basic self-care  supervision to modified independent  Toileting  modified independent   Bathroom Transfers supervision to modified independent  Bowel/Bladder  Cont B/B LBM 11/22/17  Transfers  Mod I with LRAD   Locomotion  Supervision assist with LRAD at ambulatory level   Communication     Cognition  supervision   Pain  0/10, Tolerable pain 5/10. Experience leg pain at night (numbness, tingling, pain)  Safety/Judgment  Refrain from falls/Injuries(call light proper footwear, bed/chair alarm)   Therapy Plan: PT Intensity: Minimum of 1-2 x/day ,45 to 90 minutes PT Frequency: 5 out of 7 days PT Duration Estimated Length of Stay: 8-11 days  OT Intensity: Minimum of 1-2 x/day, 45 to 90 minutes OT Frequency: 5 out of 7 days OT Duration/Estimated Length of Stay: 10-12 days SLP Intensity: Minumum of 1-2 x/day, 30 to 90 minutes SLP Frequency: 3 to  5 out of 7 days SLP Duration/Estimated Length of Stay: 7-10 days     Team Interventions: Nursing Interventions Patient/Family Education, Bladder Management, Bowel Management, Pain Management, Medication Management, Skin Care/Wound Management, Cognitive Remediation/Compensation  PT interventions Ambulation/gait training, Balance/vestibular training, Cognitive remediation/compensation, Community reintegration, Discharge planning, Disease management/prevention, DME/adaptive equipment instruction, Functional mobility training, Neuromuscular re-education, Pain management, Patient/family education, Psychosocial support, Skin care/wound management, Splinting/orthotics, Stair training, Functional electrical stimulation, Therapeutic Exercise, Therapeutic Activities, UE/LE Coordination activities, UE/LE Strength taining/ROM, Wheelchair propulsion/positioning, Visual/perceptual remediation/compensation  OT Interventions Balance/vestibular training, Discharge planning, Pain management, Self Care/advanced ADL retraining, Therapeutic Activities, Cognitive remediation/compensation, Functional mobility training, Patient/family education, Therapeutic Exercise, Community reintegration, Engineer, drilling, Neuromuscular re-education, UE/LE Strength taining/ROM  SLP Interventions Cognitive remediation/compensation, English as a second language teacher, Functional tasks, Environmental controls, Internal/external aids, Patient/family education  TR Interventions    SW/CM Interventions Discharge Planning, Psychosocial Support, Patient/Family Education   Barriers to Discharge MD  Medical stability  Nursing      PT Inaccessible home environment, Decreased caregiver support, Home environment Child psychotherapist    OT      SLP      SW       Team Discharge Planning: Destination: PT-Home ,OT- Home , SLP-Home Projected Follow-up: PT-Home health PT, OT-  24 hour supervision/assistance, SLP-24 hour supervision/assistance Projected  Equipment Needs: PT-Rolling walker with 5" wheels, To be determined, OT- To be determined, SLP-None recommended by SLP Equipment Details: PT- , OT-  Patient/family involved in discharge planning: PT- Patient,  OT-Patient, SLP-Patient  MD ELOS: 8-11 days. Medical Rehab  Prognosis:  Excellent Assessment: 52 year old male with history of PE, OSA, morbid obesity, 3-4 weeks of progressive encephalopathy with hallucination and falls who was admitted to outside hospital for work-up.  He was found to have malignant hypertension with periods of unresponsiveness due to seizures and was started on Keppra and Vimpat.  He continued to have these episodes and was transferred to [facility] on 10/27/2017 for continuous EEG monitoring.  He was intubated for airway protection due to status epilepticus and neurology following for assistance.  Hospital course significant for fevers due to Haemophilus pneumonia CAP, severe hypoglycemia, dysphagia, diarrhea as well as encephalopathy.  He tolerated extubation on zero 9/13 and has been refusing to use CPAP.  He developed recurrent fevers on 9/17 and was pancultured and started on ceftriaxone empirically.  ID consulted for input and recommended discontinuation of antibiotics due to concerns of drug fever v/s related to seizures.  Left upper extremity Dopplers done and were positive for superficial thrombosis in the cephalic vein-this has been treated with local measures.  He has defervesced and mentation is improving.  He had recurrent episode of lethargy with waxing and waning of mental status.  CT of head negative and neurology recommended increasing fosphenytoin dose to 150 mg 3 times daily.  Status epilepticus has resolved and neurology recommends continuing Keppra, Vimpat and Dilantin as well as control of hyperglycemia.    On 9/17, He has also had issues with acute on chronic renal failure with decrease in urine output and hypernatremia.  Dr. Marval Regal felt this was  due to volume depletion as well as urinary retention.  Renal ultrasound showed echogenicity within normal limits. Lisinopril and demadex placed on hold and patient treated with IV fluids as well as has required I/O cath's due to urinary retention.  Hematuria and proteinuria felt to be due to diabetic nephropathy but recommends a work-up after discharge.  Blood sugars continue to be difficulty to control with patient reporting baseline blood sugars around 400s.  He continues to refuse to use CPAP consistently.  Inappropriate behaviors and encephalopathy is resolving.  Therapies ongoing he was noted to be severely deconditioned.  Will set goals for Supervision/Mod I with PT/OT/SLP.  See Team Conference Notes for weekly updates to the plan of care

## 2017-11-22 NOTE — Progress Notes (Signed)
A/O, no noted distress. Denies pain. Pain normal arise at night with BLE, numbness, tingling, and pain. Skin intact. Call light at hand, bed alarm administered. Pt educated to the unit. Cont B/B. LBM 10/2//19

## 2017-11-22 NOTE — Discharge Summary (Signed)
[facility]  Physician Discharge Summary   Patient ID: Daniel [name] MRN: [id number] DOB/AGE: 53-Jan-1967 53 y.o.  Admit date: 10/27/2017 Discharge date: 11/22/2017  PCP: Antonietta Jewel, MD  DISCHARGE DIAGNOSES:  Status epilepticus, resolved Acute metabolic encephalopathy, resolved Hyponatremia, resolved Insulin-dependent diabetes mellitus Acute kidney injury on chronic kidney disease stage III, improved   PATIENT BEING DISCHARGED TO [facility]  RECOMMENDATIONS FOR OUTPATIENT FOLLOW UP: 1. May need to monitor Dilantin levels 2. Monitor electrolytes periodically.   DISCHARGE CONDITION: fair  Diet recommendation: Modified carbohydrate  Filed Weights   11/19/17 0500 11/20/17 0500 11/21/17 0600  Weight: (!) 137.5 kg 135.7 kg 134.4 kg    INITIAL HISTORY: Daniel C Daltonis a 52 y.o.malewith a history of seizure disorder, IDDM, OSA, OHS, morbid obesity, HTN, IDDM, and PE who presented to [facility] with confusion, headache, hallucinations, and falls found to have hypertensive urgency, started on esmolol gtt, and developed seizures in the ED. He was admitted to [facility] and intubated 9/7. Developed increasing secretions found to have Hemophilus pneumonia, treated with antibiotics, ultimately extubated 9/13. Has had an episode of diminished responsiveness for which neurology was reconsulted and reloaded dilantin due to subtherapeutic level. EEG repeated, showing generalized slowing. Nephrology was consulted for AKI and hypernatremia which are improving with IV fluids. Mental status has overall improved with bouts of delirium.    Consultants:   Neurology  PCCM  ID  Nephrology  Rehab  Procedures:  Upper extremity venous Doppler studies Right: No evidence of thrombosis in the subclavian.  Left: No evidence of deep vein thrombosis in the upper extremity. Findings consistent with age indeterminate superficial vein thrombosis involving the left  cephalic vein.  EEG 9/17 IMPRESSION: This EEG is characterized by slowing which is consistent with normal drowse. Can not rule out the possibility of slowing related to general cerebral disturbance such as a metabolic encephalopathy. Clinical correlation recommended. No epileptiform activity is noted.   EEG 9/07 >> focal non convulsive status epilepticus from Rt occipital region LP 9/8 RBC 99, WBC 4, glucose 104, protein 81   CSF 9/07 >> negative  Blood 9/10 >> negative  Sputum 9/10 >>Haemophilus influenzae  C diff PCR 9/12 >>negative    HOSPITAL COURSE:   Status epilepticus, thought to be hyperglycemia-induced - Continue AEDs per neurology: vimpat, dilantin, keppra.  -Patient remains stable. No further seizure activity.  Acute metabolic encephalopathy Patient mental status has significantly improved.  He seems to be close to baseline.  Does have some early morning sluggishness.    Hypernatremia:  Resolved with IVF and po intake.  IDDM: Uncontrolled with hyperglycemia. HbA1c 15.8%.   -Lantus dose was adjusted (increased to 20 units).  Add mealtime insulin. He is also on SSI. Of note, was on much higher dose of insulin at home, continue to follow for adjustment.  AKI on stage III CKD: Partially due to urinary retention, hypovolemia. Nephrology was consulted, started IVFs, holding diuretic and ACE.Renal function is now back to baseline. SPEP without M-spike; K/L LC ratio normal at 1.36.Based on current creatinine, patient does not have chronic kidney disease. - Nephrology signed off 9/20.  -We will need outpatient follow-up by PCP for proteinuria and hematuria noted on UA. Possibly due to diabetic nephropathy.   Hypoalbuminemia Suspect malnutrition despite morbid obesity:  - Dietitian consulted; start supplementation with magic cup TIDWC and ensure enlive po BID thickened.   Peripheral edema:  Mostly due to inactivity, hypoalbuminemia  (malnutrition and proteinuria contributing), peripheral IV's/SVT.   Hypokalemia  Hypomagnesemia  Electrolytes have been stable.  Dysphagia:  S/p FEES with SLP on 9/25 - recommended regular diet and thin liquids, no SLP follow up. Consider GI follow up with concern for primary esophageal issue per speech.  Acute on chronic hypoxic respiratory failure: Ongoing poor respiratory status due to OSA, OHS, morbid obesity, obstructive lung disease, CAP. Has been on O2 since 2018 admission for pulmonary infiltrates, possible pneumonia. - Completed antibiotics 9/17.  - CPAP qHS when able - Continue supplemental oxygen   Essential hypertension  Continue BP medications.   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 Continue home medications.  Hypothyroidism TSH 1.415 - Continue synthroid.  Diarrhea:  Has resolved, not a cause of hypokalemia currently. Negative CDiff  Left cephalic vein SVT:  Warm compresses. No DVT.  Patient complains of bilateral feet pain for the last many weeks.  Feet were examined.  Good pulses.  No wounds noted.  No ulcers.  Could be neuropathy.  Patient on multiple antiepileptics at this time.  Would hesitate to put him on something like gabapentin as it could interact with other medication and potentially worsen his encephalopathy.  Continue with topical agents.    Overall stable.  Okay for discharge to inpatient rehabilitation.    PERTINENT LABS:  The results of significant diagnostics from this hospitalization (including imaging, microbiology, ancillary and laboratory) are listed below for reference.      Labs: Basic Metabolic Panel: Recent Labs  Lab 11/16/17 0530 11/17/17 0424 11/18/17 0450 11/19/17 0615 11/20/17 0709 11/21/17 0805 11/22/17 0551  NA 141 140 139 139 137 136 137  K 3.3* 3.9 3.6 3.7 4.5 3.9 3.9  CL 106 103 100 101 103 103 99  CO2 28 29 30 29 26 25 30   GLUCOSE 196* 252* 213* 216* 235* 200* 220*  BUN 8 9 8 9 15 13 12    CREATININE 1.10 1.22 1.07 1.12 1.23 1.17 1.22  CALCIUM 8.0* 8.2* 8.4* 8.0* 8.1* 8.0* 8.5*  MG 1.6* 2.1 1.8  --   --   --  1.9  PHOS 3.8 3.1 3.3 3.9 3.8  --   --    Liver Function Tests: Recent Labs  Lab 11/17/17 0424 11/18/17 0450 11/19/17 0615 11/20/17 0709 11/21/17 1242  AST  --   --   --   --  16  ALT  --   --   --   --  16  ALKPHOS  --   --   --   --  73  BILITOT  --   --   --   --  0.4  PROT  --   --   --   --  6.1*  ALBUMIN 2.3* 2.2* 2.2* 2.3* 2.5*    Recent Labs  Lab 11/21/17 1242  AMMONIA 33   CBC: Recent Labs  Lab 11/16/17 0530 11/17/17 0424 11/18/17 0450 11/21/17 0805 11/22/17 0551  WBC 6.2 5.9 5.9 5.1 6.3  HGB 11.3* 12.0* 11.6* 12.7* 12.8*  HCT 36.6* 38.2* 37.0* 40.9 40.3  MCV 89.9 90.1 88.5 89.1 89.2  PLT 321 362 359 225 312   CBG: Recent Labs  Lab 11/21/17 1117 11/21/17 1646 11/21/17 2209 11/22/17 0630 11/22/17 1151  GLUCAP 197* 333* 194* 173* 252*     IMAGING STUDIES Dg Abd 1 View  Result Date: 10/28/2017 CLINICAL DATA:  Orogastric tube placement. EXAM: ABDOMEN - 1 VIEW COMPARISON:  10/28/2017 FINDINGS: Limited abdominal radiograph of the upper abdomen demonstrates incompletely visualized enteric catheter, curving on itself in the expected location  of gastric body. The diaphragm is collimated and therefore the location of the catheter is difficult to determine. Nonobstructive bowel gas pattern. IMPRESSION: Enteric catheter likely within gastric body, however incompletely visualized. Electronically Signed   By: Fidela Salisbury M.D.   On: 10/28/2017 13:38   Dg Abd 1 View  Result Date: 10/28/2017 CLINICAL DATA:  OG tube placement. EXAM: ABDOMEN - 1 VIEW COMPARISON:  One-view abdomen of the same day at 12:44 p.m. FINDINGS: The tip of the OG tube is in the proximal stomach, just beyond the GE junction. The bowel gas pattern is normal. IMPRESSION: Tip of OG tube is just beyond the GE junction. Electronically Signed   By: San Morelle M.D.    On: 10/28/2017 13:35   Ct Head Wo Contrast  Result Date: 11/06/2017 CLINICAL DATA:  52 y/o M; postictal, altered mental status, recent fall before admission, history of seizures. EXAM: CT HEAD WITHOUT CONTRAST TECHNIQUE: Contiguous axial images were obtained from the base of the skull through the vertex without intravenous contrast. COMPARISON:  10/25/2017 MRI head.  10/24/2017 CT head. FINDINGS: Brain: No evidence of acute infarction, hemorrhage, hydrocephalus, extra-axial collection or mass lesion/mass effect. 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 No hyperdense vessel. Skull: Normal. Negative for fracture or focal lesion. Sinuses/Orbits: Mild right posterior ethmoid air cell mucosal thickening. Additional visible paranasal sinuses and the mastoid air cells are normally aerated. Orbits are unremarkable. Other: None. IMPRESSION: 1. No acute intracranial abnormality. 2. 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 Electronically Signed   By: Kristine Garbe M.D.   On: 11/06/2017 21:52   US Renal  Result Date: 11/07/2017 CLINICAL DATA:  Acute renal failure EXAM: RENAL / URINARY TRACT ULTRASOUND COMPLETE COMPARISON:  Ultrasound 06/25/2016 FINDINGS: Right Kidney: Length: 12.9 cm. Echogenicity within normal limits. No mass or hydronephrosis visualized. Left Kidney: Length: 14 cm. Echogenicity within normal limits. No mass or hydronephrosis visualized. Bladder: Appears normal for degree of bladder distention. Incidental note made of slightly echogenic liver. IMPRESSION: Negative renal ultrasound Electronically Signed   By: Donavan Foil M.D.   On: 11/07/2017 21:15   Dg Chest Port 1 View  Result Date: 11/20/2017 CLINICAL DATA:  PICC placement EXAM: PORTABLE CHEST 1 VIEW COMPARISON:  11/09/2017 FINDINGS: PICC line has been retracted and now loops in the right upper  chest, possibly into the internal jugular vein with the tip in the right brachiocephalic vein. Cardiomegaly. Low lung volumes without confluent opacity or effusion. IMPRESSION: Right PICC line loops in the right upper chest, likely looping in the right internal jugular vein with the tip in the right brachiocephalic vein. Electronically Signed   By: Rolm Baptise M.D.   On: 11/20/2017 10:23   Dg Chest Port 1 View  Addendum Date: 11/09/2017   ADDENDUM REPORT: 11/09/2017 16:06 ADDENDUM: Recommend retracting the PICC line 3 cm. Electronically Signed   By: Kathreen Devoid   On: 11/09/2017 16:06   Result Date: 11/09/2017 CLINICAL DATA:  Right-sided PICC line insertion EXAM: PORTABLE CHEST 1 VIEW COMPARISON:  None. FINDINGS: There is a right-sided PICC line with the tip projecting over the right atrium. There is no focal parenchymal opacity. There is no pleural effusion or pneumothorax. The heart and mediastinal contours are unremarkable. The osseous structures are unremarkable. IMPRESSION: There is a right-sided PICC line with the tip projecting over the right atrium. Electronically Signed: By: Kathreen Devoid On: 11/09/2017 15:52  Dg Chest Port 1 View  Result Date: 11/05/2017 CLINICAL DATA:  Fever EXAM: PORTABLE CHEST 1 VIEW COMPARISON:  11/03/2017 FINDINGS: Heart size is normal. Soft feeding tube enters the abdomen. Mild patchy atelectasis and or infiltrate at both lung bases. No dense consolidation or lobar collapse. No edema or effusions. IMPRESSION: Patchy density at both lung bases consistent with atelectasis or mild pneumonia. Electronically Signed   By: Nelson Chimes M.D.   On: 11/05/2017 13:49   Dg Chest Port 1 View  Result Date: 11/03/2017 CLINICAL DATA:  Respiratory failure EXAM: PORTABLE CHEST 1 VIEW COMPARISON:  11/02/2017 FINDINGS: Heart is again enlarged but accentuated by the frontal technique. Endotracheal tube, nasogastric catheter and left jugular central line are again seen and stable. Elevation  the right hemidiaphragm is again seen but stable. Mild left basilar atelectasis is noted. No effusion or pneumothorax is seen. IMPRESSION: Mild left basilar atelectasis. Electronically Signed   By: Inez Catalina M.D.   On: 11/03/2017 07:35   Dg Chest Port 1 View  Result Date: 11/02/2017 CLINICAL DATA:  Respiratory failure EXAM: PORTABLE CHEST 1 VIEW COMPARISON:  11/01/2017 FINDINGS: Endotracheal tube, left central line and NG tube remain in place, unchanged. Mild elevation of the right hemidiaphragm, stable. Mild cardiomegaly and vascular congestion. No confluent airspace opacities or effusions. IMPRESSION: Cardiomegaly, vascular congestion. Stable mild elevation of the right hemidiaphragm. Electronically Signed   By: Rolm Baptise M.D.   On: 11/02/2017 07:49   Dg Chest Port 1 View  Result Date: 11/01/2017 CLINICAL DATA:  Hypoxia EXAM: PORTABLE CHEST 1 VIEW COMPARISON:  October 31, 2017 FINDINGS: Endotracheal tube tip is 3.5 cm above the carina. Nasogastric tube tip and side port are in the stomach. Central catheter tip is in the superior vena cava. No pneumothorax. There is bibasilar atelectasis. Lungs elsewhere are clear. Heart is mildly enlarged with pulmonary vascularity normal. No adenopathy. No bone lesions. IMPRESSION: Tube and catheter positions as described without evident pneumothorax. Bibasilar atelectasis. Stable cardiac prominence. Electronically Signed   By: Lowella Grip III M.D.   On: 11/01/2017 07:30   Dg Chest Port 1 View  Result Date: 10/31/2017 CLINICAL DATA:  Ventilator dependent respiratory failure. Follow-up atelectasis. EXAM: PORTABLE CHEST 1 VIEW COMPARISON:  10/30/2017, 10/29/2017 and earlier. FINDINGS: Endotracheal tube tip remains in satisfactory position projecting approximately 5 cm above the carina. LEFT jugular central venous catheter tip overlies the UPPER SVC. Nasogastric tube courses below the diaphragm into the stomach. Cardiac silhouette moderately enlarged,  unchanged. Markedly suboptimal inspiration which accounts for bibasilar atelectasis, LEFT greater than RIGHT, increased since yesterday. No new pulmonary parenchymal abnormalities elsewhere. Mild pulmonary venous hypertension without overt edema. IMPRESSION: 1.  Support apparatus satisfactory. 2. Markedly suboptimal inspiration which accounts for bibasilar atelectasis, LEFT greater than RIGHT, increased since yesterday. 3. No new abnormalities otherwise. Electronically Signed   By: Evangeline Dakin M.D.   On: 10/31/2017 09:24   Dg Chest Port 1 View  Result Date: 10/30/2017 CLINICAL DATA:  Acute respiratory failure, endotracheal tube. EXAM: PORTABLE CHEST 1 VIEW COMPARISON:  10/29/2017. FINDINGS: Endotracheal tube terminates 3.2 cm above the carina. Nasogastric tube terminates in the stomach. Left IJ central line tip projects over the SVC. Heart size stable. Lungs are low in volume with left lower lobe airspace opacification. No definite pleural fluid. IMPRESSION: Low lung volumes with left lower lobe airspace opacification, possibly due to atelectasis. Difficult to exclude aspiration or developing pneumonia. Electronically Signed   By: Lorin Picket M.D.   On: 10/30/2017 08:26  Dg Chest Port 1 View  Result Date: 10/29/2017 CLINICAL DATA:  Intubation. EXAM: PORTABLE CHEST 1 VIEW COMPARISON:  10/28/2017 FINDINGS: Endotracheal tube in satisfactory position. Left IJ approach central venous catheter in stable position. Enteric catheter within the expected location of gastric cardia. Enlarged cardiac silhouette. Low lung volumes with bilateral streaky airspace opacities. Osseous structures are without acute abnormality. Soft tissues are grossly normal. IMPRESSION: Low lung volumes with bilateral streaky airspace opacities may represent atelectasis or peribronchial airspace consolidation. No evidence of pneumothorax. Electronically Signed   By: Fidela Salisbury M.D.   On: 10/29/2017 08:05   Dg Chest Port 1  View  Result Date: 10/28/2017 CLINICAL DATA:  52 year old male with a history of line placement and intubation EXAM: PORTABLE CHEST 1 VIEW COMPARISON:  10/24/2017 FINDINGS: Cardiomediastinal silhouette unchanged with cardiomegaly. Persistently low lung volumes with crowding of the interstitium. No pneumothorax or new pleural effusion. Persistent right hemidiaphragm elevation. Interval placement of left IJ central venous catheter which appears to terminate in the superior vena cava. Interval placement of endotracheal tube, which terminates suitably above the carina approximately 5.2 cm. Interval placement of gastric tube which projects over the mediastinum and terminates out of the field of view. IMPRESSION: Endotracheal tube suitably position above the carina. Interval placement of left IJ central venous catheter which appears to terminate superior vena cava. No evidence of pneumothorax. New gastric tube. Low lung volumes with likely atelectasis. Electronically Signed   By: Corrie Mckusick D.O.   On: 10/28/2017 13:35   Dg Abd Portable 1v  Result Date: 10/28/2017 CLINICAL DATA:  Orogastric tube placement. EXAM: PORTABLE ABDOMEN - 1 VIEW COMPARISON:  None. FINDINGS: Orogastric tube tip is seen overlying the gastroesophageal junction. No evidence of dilated bowel loops. IMPRESSION: Orogastric tube tip overlies the GE junction. Electronically Signed   By: Earle Gell M.D.   On: 10/28/2017 15:16   [place] Ekg Site Rite  Result Date: 11/09/2017 If Site Rite image not attached, placement could not be confirmed due to current cardiac rhythm.   DISCHARGE EXAMINATION: Vitals:   11/21/17 2333 11/22/17 0517 11/22/17 0834 11/22/17 0842  BP: (!) 145/77 (!) 155/81  (!) 147/76  Pulse: 72 73  77  Resp: 18 18  20   Temp: 97.7 F (36.5 C) 97.8 F (36.6 C)  98.2 F (36.8 C)  TempSrc: Oral Oral  Oral  SpO2: 94% 97% 95% 98%  Weight:      Height:       General appearance: alert, cooperative, appears stated age and no  distress Resp: clear to auscultation bilaterally Cardio: regular rate and rhythm, S1, S2 normal, no murmur, click, rub or gallop GI: soft, non-tender; bowel sounds normal; no masses,  no organomegaly  DISPOSITION: CIR     Current Inpatient Medications:  Scheduled: . aspirin  81 mg Oral Daily  . budesonide (PULMICORT) nebulizer solution  0.25 mg Nebulization BID  . chlorhexidine  15 mL Mouth Rinse BID  . feeding supplement (ENSURE ENLIVE)  237 mL Oral BID BM  . heparin injection (subcutaneous)  5,000 Units Subcutaneous Q8H  . hydrALAZINE  100 mg Oral Q8H  . insulin aspart  0-20 Units Subcutaneous TID WC  . insulin aspart  0-5 Units Subcutaneous QHS  . insulin aspart  4 Units Subcutaneous TID WC  . insulin glargine  20 Units Subcutaneous QHS  . lacosamide  200 mg Oral BID  . levETIRAcetam  1,000 mg Oral BID  . levothyroxine  50 mcg Oral QAC breakfast  . mouth  rinse  15 mL Mouth Rinse q12n4p  . metoprolol succinate  50 mg Oral Daily  . pantoprazole  40 mg Oral Daily  . phenytoin  300 mg Oral QHS  . phenytoin  400 mg Oral q morning - 10a  . sodium chloride flush  10-40 mL Intracatheter Q12H   Continuous:  RMB:[id number] **OR** [DISCONTINUED] acetaminophen, albuterol, bisacodyl, diclofenac sodium, diphenhydrAMINE, hydrALAZINE, RESOURCE THICKENUP CLEAR, sodium chloride flush   TOTAL DISCHARGE TIME: 35 mins  [facility] Pager [phone]  11/22/2017, 12:22 PM

## 2017-11-23 ENCOUNTER — Inpatient Hospital Stay (HOSPITAL_COMMUNITY): Payer: Medicaid Other | Admitting: Physical Therapy

## 2017-11-23 ENCOUNTER — Inpatient Hospital Stay (HOSPITAL_COMMUNITY): Payer: Medicaid Other | Admitting: Speech Pathology

## 2017-11-23 ENCOUNTER — Inpatient Hospital Stay (HOSPITAL_COMMUNITY): Payer: Medicaid Other | Admitting: Occupational Therapy

## 2017-11-23 DIAGNOSIS — E8779 Other fluid overload: Secondary | ICD-10-CM

## 2017-11-23 DIAGNOSIS — N182 Chronic kidney disease, stage 2 (mild): Secondary | ICD-10-CM

## 2017-11-23 DIAGNOSIS — D62 Acute posthemorrhagic anemia: Secondary | ICD-10-CM

## 2017-11-23 DIAGNOSIS — E871 Hypo-osmolality and hyponatremia: Secondary | ICD-10-CM

## 2017-11-23 LAB — COMPREHENSIVE METABOLIC PANEL
ALT: 17 U/L (ref 0–44)
ANION GAP: 3 — AB (ref 5–15)
AST: 31 U/L (ref 15–41)
Albumin: 2.4 g/dL — ABNORMAL LOW (ref 3.5–5.0)
Alkaline Phosphatase: 62 U/L (ref 38–126)
BUN: 18 mg/dL (ref 6–20)
CHLORIDE: 102 mmol/L (ref 98–111)
CO2: 29 mmol/L (ref 22–32)
Calcium: 7.8 mg/dL — ABNORMAL LOW (ref 8.9–10.3)
Creatinine, Ser: 1.15 mg/dL (ref 0.61–1.24)
GFR calc non Af Amer: >60 mL/min (ref >60)
Glucose, Bld: 200 mg/dL — ABNORMAL HIGH (ref 70–99)
Potassium: 4.7 mmol/L (ref 3.5–5.1)
SODIUM: 134 mmol/L — AB (ref 135–145)
Total Bilirubin: 1.4 mg/dL — ABNORMAL HIGH (ref 0.3–1.2)
Total Protein: 5.2 g/dL — ABNORMAL LOW (ref 6.5–8.1)

## 2017-11-23 LAB — CBC WITH DIFFERENTIAL/PLATELET
Abs Immature Granulocytes: 0.1 10*3/uL (ref 0.0–0.1)
BASOS PCT: 1 %
Basophils Absolute: 0.1 10*3/uL (ref 0.0–0.1)
EOS ABS: 0.3 10*3/uL (ref 0.0–0.7)
Eosinophils Relative: 5 %
HEMATOCRIT: 38.2 % — AB (ref 39.0–52.0)
Hemoglobin: 12 g/dL — ABNORMAL LOW (ref 13.0–17.0)
IMMATURE GRANULOCYTES: 1 %
LYMPHS ABS: 1.9 10*3/uL (ref 0.7–4.0)
Lymphocytes Relative: 31 %
MCH: 28 pg (ref 26.0–34.0)
MCHC: 31.4 g/dL (ref 30.0–36.0)
MCV: 89 fL (ref 78.0–100.0)
MONOS PCT: 8 %
Monocytes Absolute: 0.5 10*3/uL (ref 0.1–1.0)
NEUTROS PCT: 54 %
Neutro Abs: 3.2 10*3/uL (ref 1.7–7.7)
PLATELETS: 281 10*3/uL (ref 150–400)
RBC: 4.29 MIL/uL (ref 4.22–5.81)
RDW: 15.3 % (ref 11.5–15.5)
WBC: 5.9 10*3/uL (ref 4.0–10.5)

## 2017-11-23 LAB — GLUCOSE, CAPILLARY
GLUCOSE-CAPILLARY: 161 mg/dL — AB (ref 70–99)
Glucose-Capillary: 194 mg/dL — ABNORMAL HIGH (ref 70–99)
Glucose-Capillary: 209 mg/dL — ABNORMAL HIGH (ref 70–99)
Glucose-Capillary: 279 mg/dL — ABNORMAL HIGH (ref 70–99)

## 2017-11-23 MED ORDER — GLUCERNA SHAKE PO LIQD
237.0000 mL | Freq: Two times a day (BID) | ORAL | Status: DC
  Administered 2017-11-23 – 2017-12-01 (×6): 237 mL via ORAL

## 2017-11-23 NOTE — Plan of Care (Signed)
Nutrition Education Note  RD consulted for nutrition education regarding diabetes.  Spoke with pt who was resting in wheelchair at time of visit. Pt often tangential throughout conversation and needed consistent redirection. Pt also confused at times. No family members present at time of visit.  Pt states that PTA, he was trying to follow a diabetic diet. Pt shares that he and his wife Pt is aware of which foods contain carbohydrates (pt lists pinto beans and potatoes as examples). Pt reports typically eating 1-2 meals daily. A meal may include baked or grilled meat with onions and green beans.  Pt states, "I want to know what I can eat, not what I can't eat." RD educated that there are no foods that pt cannot have but that he needs to be aware of portion sizes of carbohydrate containing foods. Pt expressed understanding. RD encouraged addition of non-starchy vegetables and protein to meals if pt is still hungry. Discussed that meals pt will receive during admission are diabetic friendly and that pt can use these meals as an example for meals once he discharges.  Pt states that he is currently eating about 50% of all meals and gives the other half to his wife due to financial difficulties.  Lab Results  Component Value Date   HGBA1C 15.8 (H) 10/28/2017   RD provided "Carbohydrate Counting for People with Diabetes" handout from the Academy of [employer] and Dietetics. Discussed different food groups and their effects on blood sugar, emphasizing carbohydrate-containing foods. Provided list of carbohydrates and recommended serving sizes of common foods.  Discussed importance of controlled and consistent carbohydrate intake throughout the day. Provided examples of ways to balance meals/snacks and encouraged intake of high-fiber, whole grain complex carbohydrates. Teach back method used.  Expect fair compliance.  Body mass index is 43.3 kg/m. Pt meets criteria for obesity class III based on current  BMI.  Current diet order is Carb Modified, patient is consuming approximately 80% of meals at this time. Labs and medications reviewed. Pt requested supplement switched to diabetic friendly option. RD d/c Ensure Enlive and ordered Glucerna. No further nutrition interventions warranted at this time. RD contact information provided. If additional nutrition issues arise, please re-consult RD.   Gaynell Face, MS, RD, LDN Inpatient Clinical Dietitian Pager: [phone] Weekend/After Hours: [phone]

## 2017-11-23 NOTE — Progress Notes (Signed)
New Site PHYSICAL MEDICINE & REHABILITATION     PROGRESS NOTE  Subjective/Complaints:  Patient seen lying in bed this morning.  He states he slept well overnight.  Wife at bedside.  Patient is confused.  ROS: Denies CP, S OB, nausea, vomiting, diarrhea.  Objective: Vital Signs: Blood pressure (!) 161/94, pulse 74, temperature 98 F (36.7 C), resp. rate 17, height 5\' 9"  (1.753 m), weight 133 kg, SpO2 98 %. No results found. Recent Labs    11/22/17 0551 11/23/17 0532  WBC 6.3 5.9  HGB 12.8* 12.0*  HCT 40.3 38.2*  PLT 312 281   Recent Labs    11/22/17 0551 11/23/17 0532  NA 137 134*  K 3.9 4.7  CL 99 102  GLUCOSE 220* 200*  BUN 12 18  CREATININE 1.22 1.15  CALCIUM 8.5* 7.8*   CBG (last 3)  Recent Labs    11/22/17 1751 11/22/17 2139 11/23/17 0701  GLUCAP 241* 308* 194*    Wt Readings from Last 3 Encounters:  11/23/17 133 kg  11/21/17 134.4 kg  09/15/17 (!) 139.1 kg    Physical Exam:  BP (!) 161/94 (BP Location: Left Arm)   Pulse 74   Temp 98 F (36.7 C)   Resp 17   Ht 5\' 9"  (1.753 m)   Wt 133 kg   SpO2 98%   BMI 43.30 kg/m  Constitutional: He appears well-developed.  Obese.  HENT: Normocephalic and atraumatic.  Eyes: EOM are normal.  No discharge.  Cardiovascular: Normal rate and regular rhythm.  No JVD. Respiratory: Effort normal and breath sounds normal.  GI: Soft. Bowel sounds are normal.  Musculoskeletal: He exhibits edema  in lower extremities.  He exhibits no tenderness.  Neurological: He is alert and oriented x1 Tangential and distracted.  Kept left eye closed during exam--reports "habit".   Able to follow simple motor commands.  Motor: RUE/RLE: 5/5 proximal distant LUE: 4+/5 proximal to distal LLE: Hip flexion, knee extension 4/5, ankle dorsiflexion 3-/5 Skin: Skin is warm and dry.  Psychiatric: His affect is blunt. His speech is delayed.   Assessment/Plan: 1. Functional deficits secondary to debility which require 3+ hours per day  of interdisciplinary therapy in a comprehensive inpatient rehab setting. Physiatrist is providing close team supervision and 24 hour management of active medical problems listed below. Physiatrist and rehab team continue to assess barriers to discharge/monitor patient progress toward functional and medical goals.  Function:  Bathing Bathing position      Bathing parts      Bathing assist        Upper Body Dressing/Undressing Upper body dressing                    Upper body assist        Lower Body Dressing/Undressing Lower body dressing                                  Lower body assist        Toileting Toileting          Toileting assist     Transfers Chair/bed transfer             Locomotion Ambulation           Wheelchair          Cognition Comprehension    Expression    Social Interaction    Problem Solving    Memory  Medical Problem List and Plan: 1.  Weakness, limitation self-care secondary to debility.  Begin CIR 2.  DVT Prophylaxis/Anticoagulation: Pharmaceutical: Lovenox 3. Pain Management: tylenol prn.  4. Mood: LCSW to follow for evaluation and support.  5. Neuropsych: This patient is not capable of making decisions on his own behalf. 6. Skin/Wound Care:  Routine pressure relief measures.  7. Fluids/Electrolytes/Nutrition: Strict I/O. Monitor weights daily. Low salt diet.  8.  T2DM: Educated patient on dietary restrictions.  Monitor blood sugars AC at bedtime.  Titrate medications for tighter control  Lantus 30 units at bedtime  NovoLog 3 units 3 times daily  SSI  Monitor with increased mobility 9.  HTN: Blood pressures twice daily.   Continue hydralazine 100 3 times daily  Monitor with increased mobility 10. Chronic renal failure: Will need work-up on hematuria and proteinuria after discharge.  Creatinine 1.15 on 10/3 11.  New onset seizures: Continue Keppra, Vimpat, Dilantin.  LFTs within normal  limits. Will have pharmacy follow for assistance/adjustment of dose.   12.  Fluid overload: Resolving--likely due to illness and hypoalbuminemia. Continue to monitor daily weights. Changed glucerna to prostat to avoid hyperglycemia.    Filed Weights   11/22/17 1600 11/22/17 1722 11/23/17 0420  Weight: 135.6 kg 135.6 kg 133 kg   13 Obesity hypoventilation syndrome: Encourage CPAP use at bedtime 14. Morbid Obesity: Consulted dietician for dietary education. Discussed importance of weight loss and activity to help promote health and mobility. Will need reinforcement.  15.  Hypoalbuminemia  Supplement initiated 16.  Hyponatremia  Sodium 134 on 10/3  Continue to monitor 17.  Acute blood loss anemia  Hemoglobin 12.0 on 10/3  Continue to monitor  LOS (Days) 1 A FACE TO FACE EVALUATION WAS PERFORMED  Leshea Jaggers Lorie Phenix 11/23/2017 8:30 AM

## 2017-11-23 NOTE — Progress Notes (Signed)
Patients wife brought his CPAP from home I hooked up oxygen tubing to apply to Cpap.  Patient states he self administers.  I advised if he has any issues to have RN call RT

## 2017-11-23 NOTE — Progress Notes (Signed)
Patient information reviewed and entered into eRehab system by Daiva Nakayama, RN, CRRN, [place] Coordinator.  Information including medical coding, functional ability and quality indicators will be reviewed and updated through discharge.

## 2017-11-23 NOTE — Evaluation (Signed)
Speech Language Pathology Assessment and Plan  Patient Details  Name: Daniel [name] MRN: [id number] Date of Birth: [birth year]  SLP Diagnosis: Cognitive Impairments  Rehab Potential: Good ELOS: 7-10 days     Today's Date: 11/23/2017 SLP Individual Time: 1300 - 1400     Problem List:  Patient Active Problem List   Diagnosis Date Noted  . Hyponatremia   . Stage 2 chronic kidney disease   . Debility 11/22/2017  . Morbid obesity ([place])   . Hypervolemia   . Seizures ([place])   . OSA (obstructive sleep apnea)   . Noncompliance   . FUO (fever of unknown origin)   . AKI (acute kidney injury) ([place])   . Diabetes mellitus type 2 in nonobese ([facility])   . Diabetes mellitus type 2 in obese ([place])   . Acute blood loss anemia   . Labile blood pressure   . Fever   . Status epilepticus ([place])   . Recurrent seizures ([place]) 10/27/2017  . Altered sensorium 10/27/2017  . Dyspnea on exertion 08/14/2017  . Chronic respiratory failure with hypoxia ([place]) 08/14/2017  . Sleep apnea 08/14/2017  . Obesity hypoventilation syndrome ([place]) 08/14/2017  . Fatigue 08/14/2017  . Hypertension 08/14/2017  . Hyperlipidemia 12/23/2016  . Hypothyroid 12/23/2016  . 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 CKD (chronic kidney disease), stage III ([place]) 06/24/2016  . Type 2 diabetes mellitus without complications ([place]) 59/56/3875   Past Medical History:  Past Medical History:  Diagnosis Date  . Arthritis    "elbows and wrists" (11/13/2017)  . CHF (congestive heart failure) ([place]) 08/2016  . CKD stage 3 due to type 2 diabetes mellitus ([place]) 06/2016  . Diabetes mellitus type 2 in obese ([place])   . High cholesterol   . Hypertension   . Hypothyroidism   . Migraines   . Morbid obesity ([place])   . On home oxygen therapy    "3L; now only at night" (11/13/2017)  . OSA on CPAP   . Pneumonia 2017   "double pneumonia"  . Pneumonia 10/2017   "while in ICU" (11/13/2017)  . Pulmonary  embolism ([place])   . Seizure ([place])    "10/24/2017-10/28/2017; couldn't get them stopped til medically induced coma. after they brought him out he started having more; can't find RX to stop them" (11/13/2017)  . Stroke [facility])    "scans showed several mini strokes in the past; nothing recent" (11/13/2017)   Past Surgical History:  Past Surgical History:  Procedure Laterality Date  . CARDIAC CATHETERIZATION  12/2016    Assessment / Plan / Recommendation Clinical Impression   [name] is a 52 year old male with history of PE, OSA, morbid obesity, 3-4 weeks of progressive encephalopathy with hallucination and falls who was admitted to outside hospital for work-up.  History taken from chart review and wife.  He was found to have malignant hypertension with periods of unresponsiveness due to seizures and was started on Keppra and Vimpat.  He continued to have these episodes and was transferred to [facility] on 10/27/2017 for continuous EEG monitoring.  He was intubated for airway protection due to status epilepticus and neurology following for assistance.  Hospital course significant for fevers due to Haemophilus pneumonia CAP, severe hypoglycemia, dysphagia, diarrhea as well as encephalopathy.  He tolerated extubation on zero 9/13 and has been refusing to use CPAP.  He developed recurrent fevers on 9/17 and was pancultured and started on ceftriaxone empirically.  ID  consulted for input and recommended discontinuation of antibiotics due to concerns of drug fever v/s related to seizures.  Left upper extremity Dopplers done and were positive for superficial thrombosis in the cephalic vein-this has been treated with local measures.  He has defervesced and mentation is improving.  He had recurrent episode of lethargy with waxing and waning of mental status.  CT of head reviewed, unremarkable for acute intracranial process.  Neurology recommended increasing fosphenytoin dose to 150 mg 3 times daily.  Status  epilepticus has resolved and neurology recommends continuing Keppra, Vimpat and Dilantin as well as control of hyperglycemia.    On 9/17, He has also had issues with acute on chronic renal failure with decrease in urine output and hypernatremia.  Dr. Marval Regal felt this was due to volume depletion as well as urinary retention.  Renal ultrasound showed echogenicity within normal limits. Lisinopril and demadex placed on hold and patient treated with IV fluids as well as has required I/O cath's due to urinary retention.  Hematuria and proteinuria felt to be due to diabetic nephropathy but recommends a work-up after discharge.  Blood sugars continue to be difficulty to control with patient reporting baseline blood sugars around 400s.  He continues to refuse to use CPAP consistently.  Inappropriate behaviors and encephalopathy is resolving.  Therapies ongoing he was noted to be severely deconditioned.  CIR recommended due to functional deficits.  SLP evaluation was completed on 11/23/2017 with the following results:  Pt presents with moderate cognitive deficits most evident in the areas of attention and delayed recall.  SLP administered portions of the Cognistat although test was not given in its entirety due to time constraints.  Pt scored WFL on attention, registration, comprehension, repetition, naming, and constructional subtests.  Pt scored in the range of severe impairment for memory.  Despite score for attention falling within normal range, pt functionally needed frequent redirection to the task or topic at hand. Pt does endorse that he was verbose at baseline and would often forget the question being asked due to the length and tangentiality of his response.  Overall, pt denies any cognitive changes since being hospitalized and wife was unavailable over the phone to verify baseline cognition.  Given the deficits mentioned above, pt would benefit from skilled ST while inpatient in order to maximize functional  independence and reduce burden of care prior to discharge.  Anticipate that pt will need 24/7 supervision at discharge but will defer follow up recommendations pending progress made while inpatient.    Skilled Therapeutic Interventions          Cognitive-linguistic evaluation completed with results and recommendations reviewed with patient.     SLP Assessment  Patient will need skilled [facility] during CIR admission    Recommendations  Recommendations for Other Services: Neuropsych consult Patient destination: Home Follow up Recommendations: 24 hour supervision/assistance Equipment Recommended: None recommended by SLP    SLP Frequency 3 to 5 out of 7 days   SLP Duration  SLP Intensity  SLP Treatment/Interventions 7-10 days   Minumum of 1-2 x/day, 30 to 90 minutes  Cognitive remediation/compensation;Cueing hierarchy;Functional tasks;Environmental controls;Internal/external aids;Patient/family education    Pain Pain Assessment Pain Scale: 0-10 Pain Score: 0-No pain  Prior Functioning Cognitive/Linguistic Baseline: Information not available Type of Home: House  Lives With: Spouse Available Help at Discharge: Family Vocation: On disability  Function:  Short Term Goals: Week 1: SLP Short Term Goal 1 (Week 1): STG=LTG due to ELOS   Refer to  Care Plan for Long Term Goals  Recommendations for other services: Neuropsych  Discharge Criteria: Patient will be discharged from SLP if patient refuses treatment 3 consecutive times without medical reason, if treatment goals not met, if there is a change in medical status, if patient makes no progress towards goals or if patient is discharged from hospital.  The above assessment, treatment plan, treatment alternatives and goals were discussed and mutually agreed upon: by patient  Emilio Math 11/23/2017, 4:07 PM

## 2017-11-23 NOTE — Evaluation (Signed)
Occupational Therapy Assessment and Plan  Patient Details  Name: Daniel [name] MRN: [id number] Date of Birth: 1965/11/25  OT Diagnosis: altered mental status, cognitive deficits and muscle weakness (generalized) Rehab Potential: Rehab Potential (ACUTE ONLY): Excellent ELOS: 10-12 days   Today's Date: 11/23/2017 OT Individual Time: 5397-6734 OT Individual Time Calculation (min): 61 min     Problem List:  Patient Active Problem List   Diagnosis Date Noted  . Hyponatremia   . Stage 2 chronic kidney disease   . Debility 11/22/2017  . Morbid obesity ([place])   . Hypervolemia   . Seizures ([place])   . OSA (obstructive sleep apnea)   . Noncompliance   . FUO (fever of unknown origin)   . AKI (acute kidney injury) ([place])   . Diabetes mellitus type 2 in nonobese (HCC)   . Diabetes mellitus type 2 in obese ([place])   . Acute blood loss anemia   . Labile blood pressure   . Fever   . Status epilepticus ([place])   . Recurrent seizures ([place]) 10/27/2017  . Altered sensorium 10/27/2017  . Dyspnea on exertion 08/14/2017  . Chronic respiratory failure with hypoxia ([place]) 08/14/2017  . Sleep apnea 08/14/2017  . Obesity hypoventilation syndrome ([place]) 08/14/2017  . Fatigue 08/14/2017  . Hypertension 08/14/2017  . Hyperlipidemia 12/23/2016  . Hypothyroid 12/23/2016  . 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 CKD (chronic kidney disease), stage III ([place]) 06/24/2016  . Type 2 diabetes mellitus without complications ([place]) 19/37/9024    Past Medical History:  Past Medical History:  Diagnosis Date  . Arthritis    "elbows and wrists" (11/13/2017)  . CHF (congestive heart failure) ([place]) 08/2016  . CKD stage 3 due to type 2 diabetes mellitus ([place]) 06/2016  . Diabetes mellitus type 2 in obese ([place])   . High cholesterol   . Hypertension   . Hypothyroidism   . Migraines   . Morbid obesity ([place])   . On home oxygen therapy    "3L; now only at night" (11/13/2017)  .  OSA on CPAP   . Pneumonia 2017   "double pneumonia"  . Pneumonia 10/2017   "while in ICU" (11/13/2017)  . Pulmonary embolism ([place])   . Seizure ([place])    "10/24/2017-10/28/2017; couldn't get them stopped til medically induced coma. after they brought him out he started having more; can't find RX to stop them" (11/13/2017)  . Stroke [facility])    "scans showed several mini strokes in the past; nothing recent" (11/13/2017)   Past Surgical History:  Past Surgical History:  Procedure Laterality Date  . CARDIAC CATHETERIZATION  12/2016    Assessment & Plan Clinical Impression: Patient is a 52 y.o. year old male with recent admission to the hospital on 10/27/2017 for continuous EEG monitoring.  He was intubated for airway protection due to status epilepticus and neurology following for assistance.  Hospital course significant for fevers due to Haemophilus pneumonia CAP, severe hypoglycemia, dysphagia, diarrhea as well as encephalopathy.  He tolerated extubation on zero 9/13 and has been refusing to use CPAP.  He developed recurrent fevers on 9/17 and was pancultured and started on ceftriaxone empirically.  ID consulted for input and recommended discontinuation of antibiotics due to concerns of drug fever v/s related to seizures.  Left upper extremity Dopplers done and were positive for superficial thrombosis in the cephalic vein-this has been treated with local measures.  He has defervesced and mentation is improving.  He had recurrent episode of  lethargy with waxing and waning of mental status.  CT of head reviewed, unremarkable for acute intracranial process .  Patient transferred to CIR on 11/22/2017 .    Patient currently requires mod with basic self-care skills secondary to muscle weakness, decreased awareness, decreased problem solving and decreased memory and decreased standing balance, decreased postural control and decreased balance strategies.  Prior to hospitalization, patient could complete ADLs with  independent .  Patient will benefit from skilled intervention to decrease level of assist with basic self-care skills and increase independence with basic self-care skills prior to discharge home with care partner.  Anticipate patient will require intermittent supervision and follow up home health.  OT - End of Session Activity Tolerance: Tolerates 30+ min activity with multiple rests Endurance Deficit: Yes OT Assessment Rehab Potential (ACUTE ONLY): Excellent OT Patient demonstrates impairments in the following area(s): Balance;Cognition;Endurance;Pain;Safety OT Basic ADL's Functional Problem(s): Grooming;Bathing;Dressing;Toileting OT Advanced ADL's Functional Problem(s): Simple Meal Preparation OT Transfers Functional Problem(s): Toilet;Tub/Shower OT Additional Impairment(s): None OT Plan OT Intensity: Minimum of 1-2 x/day, 45 to 90 minutes OT Frequency: 5 out of 7 days OT Duration/Estimated Length of Stay: 10-12 days OT Treatment/Interventions: Balance/vestibular training;Discharge planning;Pain management;Self Care/advanced ADL retraining;Therapeutic Activities;Cognitive remediation/compensation;Functional mobility training;Patient/family education;Therapeutic Exercise;Community reintegration;DME/adaptive equipment instruction;Neuromuscular re-education;UE/LE Strength taining/ROM OT Self Feeding Anticipated Outcome(s): independent OT Basic Self-Care Anticipated Outcome(s): supervision to modified independent OT Toileting Anticipated Outcome(s): modified independent OT Bathroom Transfers Anticipated Outcome(s): supervision to modified independent OT Recommendation Patient destination: Home Follow Up Recommendations: 24 hour supervision/assistance Equipment Recommended: To be determined   Skilled Therapeutic Intervention Pt worked on Dance movement psychotherapist during session.  He was able to ambulate to and from the shower with min assist using the RW for support.  Mod instructional cueing  for divided attention during bathing tasks as when therapist would ask a question about PLOF pt would voyage off into a long drawn out conversation and would not continue with his bathing.  He was able to complete sit to stand in the shower with min assist but demonstrated LOB posteriorly in standing X 1.  Mod demonstrational cueing for orientation of LB clothing with mod assist for donning underpants and pants over his feet.  Supervision for UB dressing.  Pt with decreased memory forgetting that this therapist had helped remove his old dressing from his PICC line, and stated that he thought the PT had removed it earlier.  Pt left in wheelchair at end of session with safety alarm belt and call button in reach.    OT Evaluation Precautions/Restrictions  Precautions Precautions: Fall Restrictions Weight Bearing Restrictions: No  Pain Pain Assessment Pain Scale: 0-10 Pain Score: 0-No pain Home Living/Prior Functioning Home Living Family/patient expects to be discharged to:: Private residence Living Arrangements: Spouse/significant other Available Help at Discharge: Family Type of Home: House Home Access: Stairs to enter Technical brewer of Steps: 3 to 4 steps  Entrance Stairs-Rails: Right, Left Home Layout: One level Bathroom Shower/Tub: Tub/shower unit, Air cabin crew Accessibility: Yes Additional Comments: wife verified info pta  Lives With: Spouse IADL History Homemaking Responsibilities: No Current License: Yes Occupation: On disability Prior Function Level of Independence: Independent with basic ADLs  Able to Take Stairs?: Yes Driving: Yes Vocation: On disability Comments: Pt was independent until 1 month ago when confusion began to increase.  ADL ADL Eating: Independent Where Assessed-Eating: Chair Grooming: Supervision/safety Where Assessed-Grooming: Wheelchair Upper Body Bathing: Supervision/safety Where Assessed-Upper Body Bathing:  Shower Lower Body Bathing: Moderate assistance Where Assessed-Lower Body Bathing:  Shower Upper Body Dressing: Supervision/safety Where Assessed-Upper Body Dressing: Wheelchair Lower Body Dressing: Moderate assistance Where Assessed-Lower Body Dressing: Wheelchair Toileting: Moderate assistance Where Assessed-Toileting: Bedside Commode Toilet Transfer: Minimal assistance Toilet Transfer Method: Counselling psychologist: Bedside commode Tub/Shower Transfer: Minimal assistance Tub/Shower Transfer Method: Optometrist: Facilities manager: Minimal assistance Social research officer, government Method: Heritage manager: Gaffer Baseline Vision/History: Wears glasses Patient Visual Report: No change from baseline Vision Assessment?: No apparent visual deficits Perception  Perception: Within Functional Limits Praxis Praxis: Intact Cognition Overall Cognitive Status: Impaired/Different from baseline Arousal/Alertness: Awake/alert Orientation Level: Person;Place;Situation Person: Oriented Place: Oriented Situation: Oriented Year: 2019 Month: October Day of Week: Correct Memory: Impaired Memory Impairment: Storage deficit;Decreased recall of new information Immediate Memory Recall: Sock;Blue;Bed Memory Recall: Sock;Blue Memory Recall Sock: Without Cue Memory Recall Blue: Without Cue Attention: Sustained;Selective Sustained Attention: Appears intact Selective Attention: Impaired Selective Attention Impairment: Functional basic;Functional complex;Verbal complex Awareness: Impaired Awareness Impairment: Anticipatory impairment Problem Solving: Impaired Problem Solving Impairment: Functional complex Executive Function: Organizing Organizing: Impaired Organizing Impairment: Functional complex;Verbal complex Behaviors: Other (comment)(hyperverbose and tangential) Safety/Judgment:  Impaired Sensation Sensation Light Touch: Appears Intact(BUE intact with gross testing) Hot/Cold: Appears Intact Proprioception: Appears Intact Stereognosis: Appears Intact Coordination Gross Motor Movements are Fluid and Coordinated: Yes Fine Motor Movements are Fluid and Coordinated: Yes Coordination and Movement Description: Coordination WFLs in BUEs Motor  Motor Motor - Skilled Clinical Observations: generalized weakness Mobility  Transfers Sit to Stand: Minimal Assistance - Patient > 75%  Trunk/Postural Assessment  Cervical Assessment Cervical Assessment: Within Functional Limits Thoracic Assessment Thoracic Assessment: Within Functional Limits Lumbar Assessment Lumbar Assessment: Within Functional Limits Postural Control Righting Reactions: delayed posteriorly in standing  Balance Balance Balance Assessed: Yes Static Sitting Balance Static Sitting - Balance Support: Feet supported Static Sitting - Level of Assistance: 7: Independent Dynamic Sitting Balance Dynamic Sitting - Balance Support: During functional activity Dynamic Sitting - Level of Assistance: 5: Stand by assistance Static Standing Balance Static Standing - Balance Support: During functional activity Static Standing - Level of Assistance: 4: Min assist Dynamic Standing Balance Dynamic Standing - Balance Support: During functional activity Dynamic Standing - Level of Assistance: 3: Mod assist Extremity/Trunk Assessment RUE Assessment RUE Assessment: Exceptions to [facility] Active Range of Motion (AROM) Comments: WFLS General Strength Comments: shoulder flexion 3+/5, elbow flexion/extension 4/5, grip 3+/5 LUE Assessment LUE Assessment: Exceptions to [facility] Active Range of Motion (AROM) Comments: AROM WFLS for all joints General Strength Comments: shoulder flexion 3+/5, elbow flexion/extension 4/5, grip 3+/5     Refer to Care Plan for Long Term Goals  Recommendations for other services: None     Discharge Criteria: Patient will be discharged from OT if patient refuses treatment 3 consecutive times without medical reason, if treatment goals not met, if there is a change in medical status, if patient makes no progress towards goals or if patient is discharged from hospital.  The above assessment, treatment plan, treatment alternatives and goals were discussed and mutually agreed upon: by patient  Hiawatha Dressel OTR/L 11/23/2017, 5:47 PM

## 2017-11-23 NOTE — Evaluation (Signed)
Physical Therapy Assessment and Plan  Patient Details  Name: Daniel [name] MRN: [id number] Date of Birth: 1965/12/11  PT Diagnosis: Abnormality of gait, Difficulty walking, Impaired cognition, Impaired sensation and Muscle weakness Rehab Potential: Good ELOS: 8-11 days    Today's Date: 11/23/2017 PT Individual Time: 0800-0909 AND 1530-1600 PT Individual Time Calculation (min): 69 min  30 min   Problem List:  Patient Active Problem List   Diagnosis Date Noted  . Debility 11/22/2017  . Morbid obesity ([place])   . Hypervolemia   . Seizures ([place])   . OSA (obstructive sleep apnea)   . Noncompliance   . FUO (fever of unknown origin)   . AKI (acute kidney injury) ([place])   . Diabetes mellitus type 2 in nonobese (HCC)   . Diabetes mellitus type 2 in obese ([place])   . Acute blood loss anemia   . Labile blood pressure   . Fever   . Status epilepticus ([place])   . Recurrent seizures ([place]) 10/27/2017  . Altered sensorium 10/27/2017  . Dyspnea on exertion 08/14/2017  . Chronic respiratory failure with hypoxia ([place]) 08/14/2017  . Sleep apnea 08/14/2017  . Obesity hypoventilation syndrome ([place]) 08/14/2017  . Fatigue 08/14/2017  . Hypertension 08/14/2017  . Hyperlipidemia 12/23/2016  . Hypothyroid 12/23/2016  . Acute combined systolic and diastolic congestive heart failure ([place]) 06/24/2016  . CKD (chronic kidney disease), stage III ([place]) 06/24/2016  . Type 2 diabetes mellitus without complications ([place]) 50/93/2671    Past Medical History:  Past Medical History:  Diagnosis Date  . Arthritis    "elbows and wrists" (11/13/2017)  . CHF (congestive heart failure) ([place]) 08/2016  . CKD stage 3 due to type 2 diabetes mellitus ([place]) 06/2016  . Diabetes mellitus type 2 in obese ([place])   . High cholesterol   . Hypertension   . Hypothyroidism   . Migraines   . Morbid obesity ([place])   . On home oxygen therapy    "3L; now only at night" (11/13/2017)  . OSA on CPAP   . Pneumonia 2017   "double  pneumonia"  . Pneumonia 10/2017   "while in ICU" (11/13/2017)  . Pulmonary embolism ([place])   . Seizure ([place])    "10/24/2017-10/28/2017; couldn't get them stopped til medically induced coma. after they brought him out he started having more; can't find RX to stop them" (11/13/2017)  . Stroke [facility])    "scans showed several mini strokes in the past; nothing recent" (11/13/2017)   Past Surgical History:  Past Surgical History:  Procedure Laterality Date  . CARDIAC CATHETERIZATION  12/2016    Assessment & Plan Clinical Impression: Patient is a 52 year old male with history of PE, OSA, morbid obesity, 3-4 weeks of progressive encephalopathy with hallucination and falls who was admitted to outside hospital for work-up.  History taken from chart review and wife.  He was found to have malignant hypertension with periods of unresponsiveness due to seizures and was started on Keppra and Vimpat.  He continued to have these episodes and was transferred to [facility] on 10/27/2017 for continuous EEG monitoring.  He was intubated for airway protection due to status epilepticus and neurology following for assistance.  Hospital course significant for fevers due to Haemophilus pneumonia CAP, severe hypoglycemia, dysphagia, diarrhea as well as encephalopathy.  He tolerated extubation on zero 9/13 and has been refusing to use CPAP.  He developed recurrent fevers on 9/17 and was pancultured and started on ceftriaxone empirically.  ID consulted for  input and recommended discontinuation of antibiotics due to concerns of drug fever v/s related to seizures.  Left upper extremity Dopplers done and were positive for superficial thrombosis in the cephalic vein-this has been treated with local measures.  He has defervesced and mentation is improving.  He had recurrent episode of lethargy with waxing and waning of mental status.  CT of head reviewed, unremarkable for acute intracranial process.  Neurology recommended increasing  fosphenytoin dose to 150 mg 3 times daily.  Status epilepticus has resolved and neurology recommends continuing Keppra, Vimpat and Dilantin as well as control of hyperglycemia.    On 9/17, He has also had issues with acute on chronic renal failure with decrease in urine output and hypernatremia.  Dr. Marval Regal felt this was due to volume depletion as well as urinary retention.  Renal ultrasound showed echogenicity within normal limits. Lisinopril and demadex placed on hold and patient treated with IV fluids as well as has required I/O cath's due to urinary retention.  Hematuria and proteinuria felt to be due to diabetic nephropathy but recommends a work-up after discharge.  Blood sugars continue to be difficulty to control with patient reporting baseline blood sugars around 400s.  He continues to refuse to use CPAP consistently.  Inappropriate behaviors and encephalopathy is resolving.   Patient transferred to CIR on 11/22/2017 .   Patient currently requires mod with mobility secondary to muscle weakness and muscle joint tightness, decreased cardiorespiratoy endurance, decreased attention, decreased awareness, decreased problem solving, decreased safety awareness, decreased memory and delayed processing and decreased sitting balance, decreased standing balance, decreased postural control and decreased balance strategies.  Prior to hospitalization, patient was independent  with mobility and lived with Spouse in a House home.  Home access is 3 to 4 steps Stairs to enter.  Patient will benefit from skilled PT intervention to maximize safe functional mobility, minimize fall risk and decrease caregiver burden for planned discharge home with intermittent assist.  Anticipate patient will benefit from follow up [facility] at discharge.     Skilled Therapeutic Intervention Session 1.  Pt received supine in bed and agreeable to PT. Supine>sit transfer with supervision assist and min cues for safety.  PT instructed  patient in PT Evaluation and initiated treatment intervention; see below for results. PT educated patient in [place], rehab potential, rehab goals, and discharge recommendations. Patient returned to room and left sitting in [facility] with call bell in reach and all needs met.     Session 2.  Pt received supine in bed and agreeable to PT. Supine>sit transfer with supevision assist and min cues for safety. Pt required increased time for awareness of situation once sitting EOB. PT instructed pt in stand pivot transfer to [facility] with RW and min assist for safety. Gait training completed with min assist for 93f x 2 with BUE support on RW. LLE foot drop noted with fatigue. WC mobility through hall with supervision assist x 732f [facility] and all needs met.      PT Evaluation Precautions/Restrictions   fall General   Vital Signs Pain   denies Home Living/Prior Functioning Home Living Available Help at Discharge: Family Type of Home: House Home Access: Stairs to enter [employer] Steps: 3 to 4 steps  Entrance Stairs-Rails: Right;Left Home Layout: One level Bathroom Shower/Tub: TuProduct/process development scientistStandard Bathroom Accessibility: Yes Additional Comments: wife verified info pta  Lives With: Spouse Prior Function Level of  Independence: Independent with basic ADLs;Independent with homemaking with ambulation;Independent with gait;Independent with transfers  Able to Take Stairs?: Yes Driving: No Vocation: On disability Vision/Perception  Vision - Assessment Additional Comments: pt noted to keep keep L eye closed initially, able to keep open with cues. pt reports premorbid habit.  Perception Perception: Within Functional Limits Praxis Praxis: Intact  Cognition Overall Cognitive Status: Impaired/Different from baseline Arousal/Alertness: Awake/alert Orientation Level: Oriented to person;Oriented to  place;Oriented to situation Attention: Selective Selective Attention: Impaired Selective Attention Impairment: Functional basic;Verbal basic Memory: Impaired Memory Impairment: Storage deficit;Retrieval deficit Awareness: Impaired Awareness Impairment: Anticipatory impairment Problem Solving: Impaired Problem Solving Impairment: Functional complex Executive Function: Organizing Organizing: Impaired Organizing Impairment: Functional complex;Verbal complex Behaviors: Other (comment)(hyperverbose and tangential) Safety/Judgment: Impaired Sensation Sensation Light Touch: Impaired Detail Peripheral sensation comments: impaired in the distal BLE  Light Touch Impaired Details: Impaired RLE;Impaired LLE Coordination Gross Motor Movements are Fluid and Coordinated: No Fine Motor Movements are Fluid and Coordinated: Yes Coordination and Movement Description: decreased coordination in BLE due to strength deficits.  Motor  Motor Motor: Other (comment) Motor - Skilled Clinical Observations: generalized weakness  Mobility Bed Mobility Bed Mobility: Rolling Right;Rolling Left;Sit to Supine;Supine to Sit Rolling Right: Supervision/verbal cueing Rolling Left: Supervision/Verbal cueing Supine to Sit: Supervision/Verbal cueing Sit to Supine: Supervision/Verbal cueing Transfers Transfers: Sit to Stand;Stand Pivot Transfers Sit to Stand: Minimal Assistance - Patient > 75% Stand Pivot Transfers: Minimal Assistance - Patient > 75% Transfer (Assistive device): Rolling walker Locomotion  Gait Ambulation: Yes Gait Assistance: Minimal Assistance - Patient > 75% Gait Distance (Feet): 50 Feet Assistive device: Rolling walker Gait Assistance Details: Pt unable to perform gait without AD due to LE weakness.  Gait Gait: Yes Gait Pattern: Right flexed knee in stance;Lateral hip instability;Wide base of support;Left foot flat Stairs / Additional Locomotion Stairs: Yes Stairs Assistance: Minimal  Assistance - Patient > 75% Stair Management Technique: Two rails Number of Stairs: 4 Height of Stairs: 6 Wheelchair Mobility Wheelchair Mobility: Yes Wheelchair Assistance: Chartered loss adjuster: Both upper extremities Wheelchair Parts Management: Supervision/cueing  Trunk/Postural Assessment  Cervical Assessment Cervical Assessment: Within Scientist, physiological Assessment: Within Functional Limits Lumbar Assessment Lumbar Assessment: Within Functional Limits Postural Control Righting Reactions: delayed posteriorly in standing  Balance Balance Balance Assessed: Yes Dynamic Sitting Balance Dynamic Sitting - Level of Assistance: 5: Stand by assistance Static Standing Balance Static Standing - Level of Assistance: 4: Min assist Dynamic Standing Balance Dynamic Standing - Level of Assistance: 3: Mod assist Extremity Assessment      RLE Assessment RLE Assessment: Exceptions to [facility] General Strength Comments: grossly 4+/5 proximal to distal  LLE Assessment LLE Assessment: Exceptions to [facility] General Strength Comments: 4+/5 hip flexion/ adduction/abduction and knee extension. 4/5 knee flexion. 3/5 ankle DF    Refer to Care Plan for Long Term Goals  Recommendations for other services: Therapeutic Recreation  Kitchen group, Stress management and Outing/community reintegration  Discharge Criteria: Patient will be discharged from PT if patient refuses treatment 3 consecutive times without medical reason, if treatment goals not met, if there is a change in medical status, if patient makes no progress towards goals or if patient is discharged from hospital.  The above assessment, treatment plan, treatment alternatives and goals were discussed and mutually agreed upon: by patient  Lorie Phenix 11/23/2017, 8:38 AM

## 2017-11-24 ENCOUNTER — Inpatient Hospital Stay (HOSPITAL_COMMUNITY): Payer: Medicaid Other | Admitting: Speech Pathology

## 2017-11-24 ENCOUNTER — Inpatient Hospital Stay (HOSPITAL_COMMUNITY): Payer: Medicaid Other | Admitting: Physical Therapy

## 2017-11-24 ENCOUNTER — Inpatient Hospital Stay (HOSPITAL_COMMUNITY): Payer: Medicaid Other | Admitting: Occupational Therapy

## 2017-11-24 DIAGNOSIS — R609 Edema, unspecified: Secondary | ICD-10-CM

## 2017-11-24 LAB — GLUCOSE, CAPILLARY
GLUCOSE-CAPILLARY: 199 mg/dL — AB (ref 70–99)
GLUCOSE-CAPILLARY: 155 mg/dL — AB (ref 70–99)
GLUCOSE-CAPILLARY: 190 mg/dL — AB (ref 70–99)
Glucose-Capillary: 220 mg/dL — ABNORMAL HIGH (ref 70–99)

## 2017-11-24 NOTE — Progress Notes (Signed)
Social Work  Social Work Assessment and Plan  Patient Details  Name: Daniel [name] MRN: [id number] Date of Birth: [birth year]  Today's Date: 11/24/2017  Problem List:  Patient Active Problem List   Diagnosis Date Noted  . Sleep disturbance   . Peripheral edema   . Hyponatremia   . Stage 2 chronic kidney disease   . Debility 11/22/2017  . Morbid obesity ([place])   . Hypervolemia   . Seizures ([place])   . OSA (obstructive sleep apnea)   . Noncompliance   . FUO (fever of unknown origin)   . AKI (acute kidney injury) ([place])   . Diabetes mellitus type 2 in nonobese (H[facility])   . Diabetes mellitus type 2 in obese ([place])   . Acute blood loss anemia   . Labile blood pressure   . Fever   . Status epilepticus ([place])   . Recurrent seizures ([place]) 10/27/2017  . Altered sensorium 10/27/2017  . Dyspnea on exertion 08/14/2017  . Chronic respiratory failure with hypoxia ([place]) 08/14/2017  . Sleep apnea 08/14/2017  . Obesity hypoventilation syndrome ([place]) 08/14/2017  . Fatigue 08/14/2017  . Hypertension 08/14/2017  . Hyperlipidemia 12/23/2016  . Hypothyroid 12/23/2016  . 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 CKD (chronic kidney disease), stage III ([place]) 06/24/2016  . Type 2 diabetes mellitus without complications ([place]) 36/62/9476   Past Medical History:  Past Medical History:  Diagnosis Date  . Arthritis    "elbows and wrists" (11/13/2017)  . CHF (congestive heart failure) ([place]) 08/2016  . CKD stage 3 due to type 2 diabetes mellitus ([place]) 06/2016  . Diabetes mellitus type 2 in obese ([place])   . High cholesterol   . Hypertension   . Hypothyroidism   . Migraines   . Morbid obesity ([place])   . On home oxygen therapy    "3L; now only at night" (11/13/2017)  . OSA on CPAP   . Pneumonia 2017   "double pneumonia"  . Pneumonia 10/2017   "while in ICU" (11/13/2017)  . Pulmonary embolism ([place])   . Seizure ([place])    "10/24/2017-10/28/2017; couldn't get them  stopped til medically induced coma. after they brought him out he started having more; can't find RX to stop them" (11/13/2017)  . Stroke [facility])    "scans showed several mini strokes in the past; nothing recent" (11/13/2017)   Past Surgical History:  Past Surgical History:  Procedure Laterality Date  . CARDIAC CATHETERIZATION  12/2016   Social History:  reports that he has never smoked. He quit smokeless tobacco use about 4 years ago.  His smokeless tobacco use included chew. He reports that he drank alcohol. He reports that he does not use drugs.  Family / Support Systems Marital Status: Married How Long?: 2010 Patient Roles: Parent, Spouse Spouse/Significant Other: wife, Daniel [name] @ (C) [phone] Children: Pt and wife have two adult sons each from prior marriages Other Supports: sister, Daniel [name] @ (C) [phone] Anticipated Caregiver: wife Ability/Limitations of Caregiver: no limitations Caregiver Availability: 24/7 Family Dynamics: Spouse very encouraging to pt and supportive. She denies any concerns about providing assist to pt upon d/c.  Social History Preferred language: English Religion: [employer] Cultural Background: NA Read: Yes Write: Yes Employment Status: Disabled Date Retired/Disabled/Unemployed: 2017 Legal Hisotry/Current Legal Issues: None Guardian/Conservator: None - per MD, pt is not capable of making decisions on his own behalf.   Abuse/Neglect Abuse/Neglect Assessment Can Be Completed: Yes Physical Abuse: Denies Verbal Abuse: Denies Sexual  Abuse: Denies Exploitation of patient/patient's resources: Denies Self-Neglect: Denies  Emotional Status Pt's affect, behavior adn adjustment status: Pt very talkative and requires occasional redirection to complete assessment interview.  He becomes tearful when he talks about his acute medical decline and the "stress it put on my wife..."  Quickly apologizes about tearfulness and states, "I don't usually do  that."  Pt denies any significant emotional distress.  Feel would benefit from neuropsychology consult and will refer. Recent Psychosocial Issues: Financial stressors since he was injured on job and unable to work for a couple of years.   Pyschiatric History: None Substance Abuse History: None  Patient / Family Perceptions, Expectations & Goals Pt/Family understanding of illness & functional limitations: Pt reports he has no recall of his hospital admission.  States, "my wife said I just went out...started having seizures..."  He and wife do have a basic understanding of his health issues, pna and of current functional limitations/ need for CIR. Premorbid pt/family roles/activities: Pt independent overall but chronic breathing issues. Anticipated changes in roles/activities/participation: Wife to provide primary caregiver support Pt/family expectations/goals: "I just want to get home."  [employer]: None Premorbid Home Care/DME Agencies: None Transportation available at discharge: yes Resource referrals recommended: Neuropsychology  Discharge Planning Living Arrangements: Spouse/significant other Support Systems: Spouse/significant other, Children, Other relatives, Friends/neighbors Type of Residence: Private residence Insurance Resources: Teacher, adult education Resources: Family Support Financial Screen Referred: Previously completed Living Expenses: Education officer, community Management: Patient Does the patient have any problems obtaining your medications?: Yes (Describe)(uninsured) Home Management: pt and wife Patient/Family Preliminary Plans: Pt to d/c home with wife who can provide 24/7 support Social Work Anticipated Follow Up Needs: HH/OP Expected length of stay: 10-12 days  Clinical Impression Very talkative gentleman here following seizure and resp failure now with debility.  Wife very involved and prepared to provide 24/7 assistance upon d/c.  Pt able to compete  assessment with some redirection due to sometimes tangential.  Slightly tearful when he talks about his situation and family support.  Will follow for support and d/c planning needs.  Daniel [name] 11/24/2017, 4:29 PM

## 2017-11-24 NOTE — Plan of Care (Signed)
  Problem: RH BOWEL ELIMINATION Goal: RH STG MANAGE BOWEL W/MEDICATION W/ASSISTANCE Description STG Manage Bowel with Medication with min Assistance.  Outcome: Progressing  Administered bowel regimen Problem: RH SAFETY Goal: RH STG ADHERE TO SAFETY PRECAUTIONS W/ASSISTANCE/DEVICE Description STG Adhere to Safety Precautions With min-mod Assistance/Device.  Outcome: Progressing  Call light within reach, bed/chair alarm, proper footwear Goal: RH STG DECREASED RISK OF FALL WITH ASSISTANCE Description STG Decreased Risk of Fall With min-mod Assistance.  Outcome: Progressing   Problem: RH PAIN MANAGEMENT Goal: RH STG PAIN MANAGED AT OR BELOW PT'S PAIN GOAL Outcome: Progressing  Administer pain regimen as ordered.

## 2017-11-24 NOTE — Progress Notes (Signed)
Physical Therapy Session Note  Patient Details  Name: Daniel [name] MRN: [id number] Date of Birth: [birth year]  Today's Date: 11/24/2017 PT Individual Time: 1400-1445     Short Term Goals: Week 1:  PT Short Term Goal 1 (Week 1): STG=LTG due ELOS  Skilled Therapeutic Interventions/Progress Updates:   Pt asleep in bed. Wife reports that he just fell asleep and would like to rest for a little while. PT returned after 30 min and Pt received supine in bed and agreeable to PT. Supine>sit transfer with min assist from Wife and cues for proper UE support once in sitting.    Stand pivot transfer to [facility] with min assist to WC. With min cues for safety.    WC mobility x 12ft with supervision assist from PT for safety pt noted to use RLE to control WC in turns and reduce stress on UE  Stand pivot transfer to nustep with supervision assist from PT. Nustep BLE endurance training, level 6, 5 min +3 min with 2 min rest break.  PT instructed pt in gait training x 17ft with min assist for safety and UE support on RW.        Therapy Documentation Precautions:  Precautions Precautions: Fall Restrictions Weight Bearing Restrictions: No General:   missed minutes: 30 ( pt asleep)   Pain: Pain Assessment Pain Scale: 0-10 Pain Score: 0-No pain   Therapy/Group: Individual Therapy  Lorie Phenix 11/24/2017, 2:21 PM

## 2017-11-24 NOTE — Progress Notes (Signed)
MEDICATION RELATED CONSULT NOTE - Follow-up  Pharmacy Consult for Phenytoin Indication: Status epilepticus / seizures  Allergies  Allergen Reactions  . Montelukast Other (See Comments)    Possibly cause headaches per spouse   Patient Measurements: Height: 5\' 9"  (175.3 cm) Weight: 293 lb 3.4 oz (133 kg) IBW/kg (Calculated) : 70.7  Vital Signs: Temp: 97.8 F (36.6 C) (10/04 0607) Temp Source: Oral (10/04 0607) BP: 148/77 (10/04 0607) Pulse Rate: 83 (10/04 0607)  Labs: Recent Labs    11/21/17 1242 11/22/17 0551 11/23/17 0532  WBC  --  6.3 5.9  HGB  --  12.8* 12.0*  HCT  --  40.3 38.2*  PLT  --  312 281  CREATININE  --  1.22 1.15  MG  --  1.9  --   ALBUMIN 2.5*  --  2.4*  PROT 6.1*  --  5.2*  AST 16  --  31  ALT 16  --  17  ALKPHOS 73  --  62  BILITOT 0.4  --  1.4*  BILIDIR 0.2  --   --   IBILI 0.2*  --   --    Estimated Creatinine Clearance: 100.4 mL/min (by C-G formula based on SCr of 1.15 mg/dL).  Assessment: 52 yo M presented with status epilepticus. Started on several anti-seizure medications. Seizure medications switched to po 9/25 to prepare for discharge. Remains stable without seizure activity.    Goal of Therapy:  Total Phenytoin level 10-20 mcg/ml Prevention of seizures  Plan:  Continue phenytoin 400mg  PO qAM and 300mg  PO qPM Monitor clinical picture Consider total phenytoin level on 10/7  Elenor Quinones, PharmD, BCPS Clinical Pharmacist Phone number [phone] 11/24/2017 8:19 AM

## 2017-11-24 NOTE — Progress Notes (Deleted)
[place] PHYSICAL MEDICINE & REHABILITATION     PROGRESS NOTE  Subjective/Complaints:  Patient seen lying in bed this morning.  He states he did not sleep well overnight.  Family defers.  Patient and family are equally agitated with each other.  Patient complaining that family member ate his sandwich last night and is upset.  He states he had a rough day of therapy yesterday, but persevered.  ROS: Denies CP, S OB, nausea, vomiting, diarrhea.  Objective: Vital Signs: Blood pressure (!) 148/77, pulse 83, temperature 97.8 F (36.6 C), temperature source Oral, resp. rate 18, height 5\' 9"  (1.753 m), weight 133 kg, SpO2 96 %. No results found. Recent Labs    11/22/17 0551 11/23/17 0532  WBC 6.3 5.9  HGB 12.8* 12.0*  HCT 40.3 38.2*  PLT 312 281   Recent Labs    11/22/17 0551 11/23/17 0532  NA 137 134*  K 3.9 4.7  CL 99 102  GLUCOSE 220* 200*  BUN 12 18  CREATININE 1.22 1.15  CALCIUM 8.5* 7.8*   CBG (last 3)  Recent Labs    11/23/17 1634 11/23/17 2208 11/24/17 0647  GLUCAP 161* 279* 220*    Wt Readings from Last 3 Encounters:  11/23/17 133 kg  11/24/17 133.4 kg  09/15/17 (!) 139.1 kg    Physical Exam:  BP (!) 148/77 (BP Location: Left Arm)   Pulse 83   Temp 97.8 F (36.6 C) (Oral)   Resp 18   Ht 5\' 9"  (1.753 m)   Wt 133 kg   SpO2 96%   BMI 43.30 kg/m  Constitutional: He appears well-developed.  Obese.  HENT: Normocephalic and atraumatic.  Eyes: EOM are normal.  No discharge.  Cardiovascular: Normal rate and regular rhythm.  No JVD. Respiratory: Effort normal and breath sounds normal.  GI: Soft. Bowel sounds are normal.  Musculoskeletal: He exhibits  edema  in lower extremities.  He exhibits no tenderness.  Neurological: He is alert and oriented x1, states he does not keep track of those things Tangential and distracted.  Kept left eye closed during exam--reports "habit".   Able to follow simple motor commands.  Motor: RUE/RLE: 5/5 proximal to  distal LUE: 4+/5 proximal to distal LLE: Hip flexion, knee extension 4/5, ankle dorsiflexion 2/5 Skin: Skin is warm and dry.  Psychiatric: His affect is blunt. His speech is delayed.   Assessment/Plan: 1. Functional deficits secondary to debility which require 3+ hours per day of interdisciplinary therapy in a comprehensive inpatient rehab setting. Physiatrist is providing close team supervision and 24 hour management of active medical problems listed below. Physiatrist and rehab team continue to assess barriers to discharge/monitor patient progress toward functional and medical goals.  Function:  Bathing Bathing position      Bathing parts      Bathing assist        Upper Body Dressing/Undressing Upper body dressing                    Upper body assist        Lower Body Dressing/Undressing Lower body dressing                                  Lower body assist        Toileting Toileting          Toileting assist     Transfers Chair/bed transfer  Psychologist, sport and exercise Comprehension    Expression    Social Interaction    Problem Solving    Memory      Medical Problem List and Plan: 1.  Weakness, limitation self-care secondary to debility.  Continue CIR 2.  DVT Prophylaxis/Anticoagulation: Pharmaceutical: Lovenox 3. Pain Management: tylenol prn.  4. Mood: LCSW to follow for evaluation and support.  5. Neuropsych: This patient is not capable of making decisions on his own behalf. 6. Skin/Wound Care:  Routine pressure relief measures.  7. Fluids/Electrolytes/Nutrition: Strict I/O. Monitor weights daily. Low salt diet.  8.  T2DM: Educated patient on dietary restrictions.  Monitor blood sugars AC at bedtime.  Titrate medications for tighter control  Lantus 30 units at bedtime  NovoLog 3 units 3 times daily  SSI  Remains elevated, will consider further increase tomorrow 9.   HTN: Blood pressures twice daily.   Continue hydralazine 100 3 times daily  Labile on 10/4 10. Chronic renal failure: Will need work-up on hematuria and proteinuria after discharge.  Creatinine 1.15 on 10/3  Labs ordered for Monday 11.  New onset seizures: Continue Keppra, Vimpat, Dilantin.  LFTs within normal limits. Will have pharmacy follow for assistance/adjustment of dose.   12.  Fluid overload: Resolving--likely due to illness and hypoalbuminemia. Continue to monitor daily weights. Changed glucerna to prostat to avoid hyperglycemia.    Filed Weights   11/22/17 1600 11/22/17 1722 11/23/17 0420  Weight: 135.6 kg 135.6 kg 133 kg   13 Obesity hypoventilation syndrome: Encourage CPAP use at bedtime 14. Morbid Obesity: Consulted dietician for dietary education. Discussed importance of weight loss and activity to help promote health and mobility. Will need reinforcement.  15.  Hypoalbuminemia  Supplement initiated 16.  Hyponatremia  Sodium 134 on 10/3  Labs ordered for Monday  Continue to monitor 17.  Acute blood loss anemia  Hemoglobin 12.0 on 10/3  Labs ordered for Monday  Continue to monitor  LOS (Days) 2 A FACE TO FACE EVALUATION WAS PERFORMED  Ankit Lorie Phenix 11/24/2017 8:40 AM

## 2017-11-24 NOTE — Progress Notes (Signed)
Speech Language Pathology Daily Session Note  Patient Details  Name: Daniel [name] MRN: [id number] Date of Birth: [birth year]  Today's Date: 11/24/2017 SLP Individual Time: 1105-1200 SLP Individual Time Calculation (min): 55 min  Short Term Goals: Week 1: SLP Short Term Goal 1 (Week 1): STG=LTG due to ELOS   Skilled Therapeutic Interventions:  Pt was seen for skilled ST targeting goals for cognition.  Pt's wife was present during today's therapy session and verified that pt's cognition is better than it was immediately prior to hospitalization but that is not yet back to baseline.  SLP explained the role of ST interventions while on CIR for maximizing pt's functional independence and reducing burden of care prior to discharge.  SLP facilitated the session with a basic money management task to address goals for memory, attention, and problem solving.  Pt needed mod-max cues to count money due to working memory defiicts.  Mental math calculations were easier for pt to complete with cues to use paper and pencil otherwise pt would repeatedly ask therapist to repeat instructions and targeted values.  Pt appeared to gain better insight into his deficits after completing task as handling money was very easy to him prior to admission.  Pt was returned to room and left in wheelchair with his wife at bedside.  Continue per current plan of care.      Pain Pain Assessment Pain Scale: 0-10 Pain Score: 0-No pain  Therapy/Group: Individual Therapy  Alfhild Partch, Selinda Orion 11/24/2017, 1:08 PM

## 2017-11-24 NOTE — Progress Notes (Signed)
[place] PHYSICAL MEDICINE & REHABILITATION PROGRESS NOTE  Subjective/Complaints:  Patient seen lying in bed this morning.  He states he did not sleep well overnight.  Family defers.  Patient and family are equally agitated with each other.  Patient complaining that family member ate his sandwich last night and is upset.  He states he had a rough day of therapy yesterday, but persevered.  ROS: Denies CP, S OB, nausea, vomiting, diarrhea.  Objective: Vital Signs: Blood pressure (!) 148/77, pulse 83, temperature 97.8 F (36.6 C), temperature source Oral, resp. rate 18, height 5\' 9"  (1.753 m), weight 133 kg, SpO2 96 %. No results found. Recent Labs    11/22/17 0551 11/23/17 0532  WBC 6.3 5.9  HGB 12.8* 12.0*  HCT 40.3 38.2*  PLT 312 281   Recent Labs    11/22/17 0551 11/23/17 0532  NA 137 134*  K 3.9 4.7  CL 99 102  CO2 30 29  GLUCOSE 220* 200*  BUN 12 18  CREATININE 1.22 1.15  CALCIUM 8.5* 7.8*    Physical Exam: BP (!) 148/77 (BP Location: Left Arm)   Pulse 83   Temp 97.8 F (36.6 C) (Oral)   Resp 18   Ht 5\' 9"  (1.753 m)   Wt 133 kg   SpO2 96%   BMI 43.30 kg/m  Constitutional: He appearswell-developed.  Obese. HENT: Normocephalicand atraumatic.  Eyes:EOMare normal.  No discharge.  Cardiovascular:Normal rate and regular rhythm.  No JVD. Respiratory:Effort normaland breath sounds normal.  PX:TGGY.Bowel sounds are normal.  Musculoskeletal: He exhibits edema in lower extremities.  He exhibits notenderness.  Neurological: He isalert and oriented x1, states he does not keep track of those things Tangential and distracted.  Kept left eye closed during exam--reports "habit".  Able to follow simple motor commands.  Motor: RUE/RLE: 5/5 proximal to distal LUE: 4+/5 proximal to distal LLE: Hip flexion, knee extension 4/5, ankle dorsiflexion 2/5 Skin: Skin iswarmand dry.  Psychiatric: His affect isblunt. His speech  isdelayed.   Assessment/Plan: 1. Functional deficits secondary to debility which require 3+ hours per day of interdisciplinary therapy in a comprehensive inpatient rehab setting.  Physiatrist is providing close team supervision and 24 hour management of active medical problems listed below.  Physiatrist and rehab team continue to assess barriers to discharge/monitor patient progress toward functional and medical goals  Care Tool:  Bathing    Body parts bathed by patient: Right arm, Left arm, Chest, Abdomen, Front perineal area, Right upper leg, Left upper leg, Face   Body parts bathed by helper: Left lower leg, Right lower leg, Buttocks     Bathing assist Assist Level: Moderate Assistance - Patient 50 - 74%     Upper Body Dressing/Undressing Upper body dressing   What is the patient wearing?: Pull over shirt    Upper body assist Assist Level: Set up assist    Lower Body Dressing/Undressing Lower body dressing      What is the patient wearing?: Underwear/pull up, Pants     Lower body assist Assist for lower body dressing: Moderate Assistance - Patient 50 - 74%     Toileting Toileting    Toileting assist Assist for toileting: Independent with assistive device     Transfers Chair/bed transfer  Transfers assist  Chair/bed transfer activity did not occur: N/A  Chair/bed transfer assist level: Minimal Assistance - Patient > 75%     Locomotion Ambulation   Ambulation assist   Ambulation activity did not occur: Safety/medical concerns  Assist level:  Minimal Assistance - Patient > 75% Assistive device: (none) Max distance: 10'   Walk 10 feet activity   Assist  Walk 10 feet activity did not occur: Safety/medical concerns  Assist level: Minimal Assistance - Patient > 75% Assistive device: Walker-rolling   Walk 50 feet activity   Assist Walk 50 feet with 2 turns activity did not occur: Safety/medical concerns         Walk 150 feet  activity   Assist Walk 150 feet activity did not occur: Safety/medical concerns         Walk 10 feet on uneven surface  activity   Assist Walk 10 feet on uneven surfaces activity did not occur: Safety/medical concerns         Wheelchair     Assist   Type of Wheelchair: Manual    Wheelchair assist level: Supervision/Verbal cueing Max wheelchair distance: 114ft     Wheelchair 50 feet with 2 turns activity    Assist        Assist Level: Supervision/Verbal cueing   Wheelchair 150 feet activity     Assist     Assist Level: Supervision/Verbal cueing      Medical Problem List and Plan: 1. Weakness, limitation self-care secondary to debility.             Continue CIR 2. DVT Prophylaxis/Anticoagulation: Pharmaceutical: Lovenox 3. Pain Management: tylenol prn.  4. Mood: LCSW to follow for evaluation and support.  5. Neuropsych: This patient is not capable of making decisions on his own behalf. 6. Skin/Wound Care: Routine pressure relief measures.  7. Fluids/Electrolytes/Nutrition: Strict I/O. Monitor weights daily. Low salt diet.  8. T2DM: Educated patient on dietary restrictions. Monitor blood sugars AC at bedtime. Titrate medications for tighter control             Lantus 30 units at bedtime             NovoLog 3 units 3 times daily             SSI             Remains elevated, will consider further increase tomorrow 9. HTN: Blood pressures twice daily.              Continue hydralazine 100 3 times daily             Labile on 10/4 10. Chronic renal failure: Will need work-up on hematuria and proteinuria after discharge.             Creatinine 1.15 on 10/3             Labs ordered for Monday 11. New onset seizures: Continue Keppra, Vimpat, Dilantin. LFTs within normal limits. Will have pharmacy follow for assistance/adjustment of dose.  12. Fluid overload: Resolving--likely due to illness and hypoalbuminemia. Continue to monitor daily  weights. Changed glucerna to prostat to avoid hyperglycemia.                    Filed Weights   11/22/17 1600 11/22/17 1722 11/23/17 0420  Weight: 135.6 kg 135.6 kg 133 kg   13 Obesity hypoventilation syndrome: Encourage CPAP use at bedtime 14. Morbid Obesity: Consulted dietician for dietary education. Discussed importance of weight loss and activity to help promote health and mobility. Will need reinforcement.  15.  Hypoalbuminemia             Supplement initiated 16.  Hyponatremia  Sodium 134 on 10/3             Labs ordered for Monday             Continue to monitor 17.  Acute blood loss anemia             Hemoglobin 12.0 on 10/3             Labs ordered for Monday             Continue to monitor    LOS: 2 days A FACE TO FACE EVALUATION WAS PERFORMED  Aeron Lheureux Lorie Phenix 11/24/2017, 8:49 AM

## 2017-11-24 NOTE — Progress Notes (Signed)
Occupational Therapy Session Note  Patient Details  Name: Daniel [name] MRN: [id number] Date of Birth: [birth year]  Today's Date: 11/24/2017 OT Individual Time: 0800-0900 OT Individual Time Calculation (min): 60 min    Short Term Goals: Week 1:  OT Short Term Goal 1 (Week 1): Pt will complete LB bathing sit to stand with supervision and AE PRN. OT Short Term Goal 2 (Week 1): Pt will complete LB dressing sit to stand with supervision and AE. OT Short Term Goal 3 (Week 1): Pt will complete toilet transfer with supervision using RW to elevated toilet or 3:1.   Skilled Therapeutic Interventions/Progress Updates:    Pt sitting EOB to start session finishing breakfast.  He was not oriented to day of the week but was able to state the month.  His wife was present to start session and stated that in the morning he has exhibited difficulty with knowing where he was and the day of the week.  After finishing breakfast, pt then donned his shoes with increased time.  Pt still demonstrating increased external distraction with conversation.  He then completed functional mobility with use of the RW over to the sink for grooming tasks of washing his face and shaving.  He stood for 2 mins with min assist, demonstrating occasional LOB posteriorly before requesting to sit down.  He stood again for approximately the same amount of time before requesting to sit, while he washed his face.  In sitting he donned shaving cream and completed shaving with overall min assist for thoroughness and mod instructional cueing for selective attention secondary to increased conversation.  Finished session with pt in the wheelchair at bed side with bed alarm in place and call button and phone in reach.  Pt still not remembering the day of the week at end of session, even though he was told at the start.    Therapy Documentation Precautions:  Precautions Precautions: Fall Restrictions Weight Bearing Restrictions: No  Pain: Pain  Assessment Pain Score: 0-No pain ADL: Therapy/Group: Individual Therapy  Teague Goynes OTR/L 11/24/2017, 9:42 AM

## 2017-11-24 NOTE — Progress Notes (Signed)
Patient is sleeping and wife refused breathing treatment and asked [place] not to wake husband if he is sleeping.  CPAP was not put on due to wife refusing while husband is asleep.

## 2017-11-25 ENCOUNTER — Inpatient Hospital Stay (HOSPITAL_COMMUNITY): Payer: Medicaid Other | Admitting: Speech Pathology

## 2017-11-25 ENCOUNTER — Inpatient Hospital Stay (HOSPITAL_COMMUNITY): Payer: Medicaid Other | Admitting: Physical Therapy

## 2017-11-25 ENCOUNTER — Inpatient Hospital Stay (HOSPITAL_COMMUNITY): Payer: Medicaid Other | Admitting: Occupational Therapy

## 2017-11-25 DIAGNOSIS — R0989 Other specified symptoms and signs involving the circulatory and respiratory systems: Secondary | ICD-10-CM

## 2017-11-25 LAB — GLUCOSE, CAPILLARY
GLUCOSE-CAPILLARY: 210 mg/dL — AB (ref 70–99)
GLUCOSE-CAPILLARY: 165 mg/dL — AB (ref 70–99)
Glucose-Capillary: 165 mg/dL — ABNORMAL HIGH (ref 70–99)
Glucose-Capillary: 179 mg/dL — ABNORMAL HIGH (ref 70–99)

## 2017-11-25 MED ORDER — INSULIN GLARGINE 100 UNIT/ML ~~LOC~~ SOLN
5.0000 [IU] | Freq: Every day | SUBCUTANEOUS | Status: DC
  Administered 2017-11-25 – 2017-11-29 (×5): 5 [IU] via SUBCUTANEOUS
  Filled 2017-11-25 (×6): qty 0.05

## 2017-11-25 NOTE — Progress Notes (Signed)
Physical Therapy Session Note  Patient Details  Name: Daniel [name] MRN: [id number] Date of Birth: 06-01-1965  Today's Date: 11/25/2017 PT Individual Time: 0805-0915 PT Individual Time Calculation (min): 70 min   Short Term Goals: Week 1:  PT Short Term Goal 1 (Week 1): STG=LTG due ELOS  Skilled Therapeutic Interventions/Progress Updates:    Pt received sitting up EOB and agreeable to PT.   Dressing upper and lower body sitting EOB with supervision assist from PT for safety and set up.. Stand pivot transfer to [facility] with supervision assist and UE support on RW.   Standing balance on blue wedge with cognitive task to complete pipe tree with moderate difficulty 4 bouts x 45-sec 1 min to complete. Supervision-min assist from PT for safety and to prevent posterior LOB  Gait training instructed by PT with RW x 170f with supervision assist fading to min assist for the last 147fdue to intermittent foot drag on the L. Cues for step height and length to improve safety throughout.   BLE strengthening step ups on 6 in step x 5 BLE with BUE support. Min assist from PT overall with once instance of mod assist to prevent   WC mobility x 12069fith supervision assist from PT with min cues for improved turning technique.   Patient returned to room and left sitting in [facility] reach and all needs met.           Therapy Documentation Precautions:  Precautions Precautions: Fall Restrictions Weight Bearing Restrictions: No    Vital Signs: Therapy Vitals Pulse Rate: 83 BP: 138/76 Oxygen Therapy SpO2: 95 % O2 Device: Room Air Pain: Pain Assessment Pain Scale: 0-10 Pain Score: 0-No pain    Therapy/Group: Individual Therapy  AusLorie Phenix/06/2017, 9:18 AM

## 2017-11-25 NOTE — Plan of Care (Signed)
  Problem: Consults Goal: Diabetes Guidelines if Diabetic/Glucose > 140 Description If diabetic or lab glucose is > 140 mg/dl - Initiate Diabetes/Hyperglycemia Guidelines & Document Interventions  Outcome: Progressing Goal: RH GENERAL PATIENT EDUCATION Description See Patient Education module for education specifics. Outcome: Progressing   Problem: RH BOWEL ELIMINATION Goal: RH STG MANAGE BOWEL WITH ASSISTANCE Description STG Manage Bowel with min Assistance.  Outcome: Progressing Goal: RH STG MANAGE BOWEL W/MEDICATION W/ASSISTANCE Description STG Manage Bowel with Medication with min Assistance.  Outcome: Progressing   Problem: RH BLADDER ELIMINATION Goal: RH STG MANAGE BLADDER WITH ASSISTANCE Description STG Manage Bladder With min Assistance  Outcome: Progressing   Problem: RH SKIN INTEGRITY Goal: RH STG SKIN FREE OF INFECTION/BREAKDOWN Outcome: Progressing   Problem: RH SAFETY Goal: RH STG ADHERE TO SAFETY PRECAUTIONS W/ASSISTANCE/DEVICE Description STG Adhere to Safety Precautions With min-mod Assistance/Device.  Outcome: Progressing Goal: RH STG DECREASED RISK OF FALL WITH ASSISTANCE Description STG Decreased Risk of Fall With min-mod Assistance.  Outcome: Progressing   Problem: RH PAIN MANAGEMENT Goal: RH STG PAIN MANAGED AT OR BELOW PT'S PAIN GOAL Outcome: Progressing   Problem: RH KNOWLEDGE DEFICIT GENERAL Goal: RH STG INCREASE KNOWLEDGE OF SELF CARE AFTER HOSPITALIZATION Outcome: Progressing

## 2017-11-25 NOTE — Progress Notes (Signed)
[facility] PHYSICAL MEDICINE & REHABILITATION PROGRESS NOTE  Subjective/Complaints:  Patient seen laying in bed this morning.  He states he slept fairly overnight, but later states that he was sleeping well.  He is not oriented, but able to recall the events of yesterday.  ROS:  Denies CP, S OB, nausea, vomiting, diarrhea.  Objective: Vital Signs: Blood pressure 138/76, pulse 83, temperature 97.8 F (36.6 C), temperature source Oral, resp. rate 16, height 5\' 9"  (1.753 m), weight 130.7 kg, SpO2 95 %. No results found. Recent Labs    11/23/17 0532  WBC 5.9  HGB 12.0*  HCT 38.2*  PLT 281   Recent Labs    11/23/17 0532  NA 134*  K 4.7  CL 102  CO2 29  GLUCOSE 200*  BUN 18  CREATININE 1.15  CALCIUM 7.8*    Physical Exam: BP 138/76   Pulse 83   Temp 97.8 F (36.6 C) (Oral)   Resp 16   Ht 5\' 9"  (1.753 m)   Wt 130.7 kg   SpO2 95%   BMI 42.55 kg/m  Constitutional: He appearswell-developed.  Obese. HENT: Normocephalicand atraumatic.  Eyes:EOMare normal.  No discharge.  Cardiovascular:RRR.  No JVD. Respiratory:Effort normal and breath sounds normal.  TJ:QZES.Bowel sounds are normal.  Musculoskeletal: He exhibits edema in lower extremities, stable.  He exhibits notenderness.  Neurological: He isalert and oriented x2, states he does not keep track of those things Kept left eye closed during exam--reports "habit".  Able to follow simple motor commands.  Motor: RUE/RLE: 5/5 proximal to distal LUE: 4+/5 proximal to distal LLE: Hip flexion, knee extension 4/5, ankle dorsiflexion 2+/5 Skin: Skin iswarmand dry.  Psychiatric: His affect isblunt. His speech isdelayed.   Assessment/Plan: 1. Functional deficits secondary to debility which require 3+ hours per day of interdisciplinary therapy in a comprehensive inpatient rehab setting.  Physiatrist is providing close team supervision and 24 hour management of active medical problems listed  below.  Physiatrist and rehab team continue to assess barriers to discharge/monitor patient progress toward functional and medical goals  Care Tool:  Bathing    Body parts bathed by patient: Right arm, Left arm, Chest, Abdomen, Front perineal area, Right upper leg, Left upper leg, Face   Body parts bathed by helper: Left lower leg, Right lower leg, Buttocks     Bathing assist Assist Level: Moderate Assistance - Patient 50 - 74%     Upper Body Dressing/Undressing Upper body dressing   What is the patient wearing?: Pull over shirt    Upper body assist Assist Level: Set up assist    Lower Body Dressing/Undressing Lower body dressing      What is the patient wearing?: Underwear/pull up, Pants     Lower body assist Assist for lower body dressing: Moderate Assistance - Patient 50 - 74%     Toileting Toileting    Toileting assist Assist for toileting: Minimal Assistance - Patient > 75%     Transfers Chair/bed transfer  Transfers assist  Chair/bed transfer activity did not occur: N/A  Chair/bed transfer assist level: Supervision/Verbal cueing     Locomotion Ambulation   Ambulation assist   Ambulation activity did not occur: Safety/medical concerns  Assist level: Minimal Assistance - Patient > 75% Assistive device: Walker-rolling Max distance: 143   Walk 10 feet activity   Assist  Walk 10 feet activity did not occur: Safety/medical concerns  Assist level: Supervision/Verbal cueing Assistive device: Walker-rolling   Walk 50 feet activity   Assist Walk 50  feet with 2 turns activity did not occur: Safety/medical concerns  Assist level: Supervision/Verbal cueing Assistive device: Walker-platform    Walk 150 feet activity   Assist Walk 150 feet activity did not occur: Safety/medical concerns         Walk 10 feet on uneven surface  activity   Assist Walk 10 feet on uneven surfaces activity did not occur: Safety/medical concerns          Wheelchair     Assist   Type of Wheelchair: Manual    Wheelchair assist level: Supervision/Verbal cueing Max wheelchair distance: 110ft    Wheelchair 50 feet with 2 turns activity    Assist        Assist Level: Supervision/Verbal cueing   Wheelchair 150 feet activity     Assist     Assist Level: Supervision/Verbal cueing      Medical Problem List and Plan: 1. Weakness, limitation self-care secondary to debility.             Continue CIR 2. DVT Prophylaxis/Anticoagulation: Pharmaceutical: Lovenox 3. Pain Management: tylenol prn.  4. Mood: LCSW to follow for evaluation and support.  5. Neuropsych: This patient is not capable of making decisions on his own behalf. 6. Skin/Wound Care: Routine pressure relief measures.  7. Fluids/Electrolytes/Nutrition: Strict I/O. Monitor weights daily. Low salt diet.  8. T2DM: Educated patient on dietary restrictions. Monitor blood sugars AC at bedtime. Titrate medications for tighter control             Lantus 30 units at bedtime  Lantus 5 units daily added on 10/5             NovoLog 3 units 3 times daily             SSI 9. HTN: Blood pressures twice daily.              Continue hydralazine 100 3 times daily             Labile on 10/5 10. Chronic renal failure: Will need work-up on hematuria and proteinuria after discharge.             Creatinine 1.15 on 10/3             Labs ordered for Monday 11. New onset seizures: Continue Keppra, Vimpat, Dilantin. LFTs within normal limits. Will have pharmacy follow for assistance/adjustment of dose.  12. Fluid overload: Resolving--likely due to illness and hypoalbuminemia. Continue to monitor daily weights. Changed glucerna to prostat to avoid hyperglycemia.                    Filed Weights   11/22/17 1600 11/22/17 1722 11/23/17 0420  Weight: 135.6 kg 135.6 kg 133 kg   13 Obesity hypoventilation syndrome: Encourage CPAP use at bedtime 14. Morbid Obesity:  Consulted dietician for dietary education. Discussed importance of weight loss and activity to help promote health and mobility. Will need reinforcement.  15.  Hypoalbuminemia             Supplement initiated 16.  Hyponatremia             Sodium 134 on 10/3             Labs ordered for Monday             Continue to monitor 17.  Acute blood loss anemia             Hemoglobin 12.0 on 10/3  Labs ordered for Monday             Continue to monitor    LOS: 3 days A FACE TO FACE EVALUATION WAS PERFORMED  Ankit Lorie Phenix 11/25/2017, 12:10 PM

## 2017-11-25 NOTE — Progress Notes (Signed)
Occupational Therapy Session Note  Patient Details  Name: Daniel [name] MRN: [id number] Date of Birth: [birth year]  Today's Date: 11/25/2017 OT Individual Time: 1000-11  total minutes 60   Short Term Goals: Week 1:  OT Short Term Goal 1 (Week 1): Pt will complete LB bathing sit to stand with supervision and AE PRN. OT Short Term Goal 2 (Week 1): Pt will complete LB dressing sit to stand with supervision and AE. OT Short Term Goal 3 (Week 1): Pt will complete toilet transfer with supervision using RW to elevated toilet or 3:1.   Skilled Therapeutic Interventions/Progress Updates: patient stated he wasextremely fatigued from earlier PT session and stated he was too fatigued to complete bathing and dressing and toileting even though he desired to do so.  He also stated he was a little uncomfortable complete self care with a male other than his wife.    He continued stated he needed to ly down and rest.   He completed peri and buttock cleansing with Min A for thoroughness (wife assisted while this clinician provided CGA at left knee to guard leg in case he did not give warning he needed to sit.   He was able to complete doffing dirty short and cleans shorts and underwear standing at walking with Assist to stabilize and walker and cues to move feet with minimal assist also from wife.     He completed w/c to bed transfer with supervision and bed mobility with min A.   He was left in the supportive hands ofhis wife and had dozed off to sleep as this clinician was exiting the room.     Therapy Documentation Precautions:  Precautions Precautions: Fall Restrictions Weight Bearing Restrictions: No  Pain:denied  Therapy/Group: Individual Therapy  Alfredia Ferguson [facility] 11/25/2017, 3:40 PM

## 2017-11-25 NOTE — Progress Notes (Signed)
Speech Language Pathology Daily Session Note  Patient Details  Name: Daniel [name] MRN: [id number] Date of Birth: 1965/03/30  Today's Date: 11/25/2017 SLP Individual Time: 1712-7871 SLP Individual Time Calculation (min): 40 min  Short Term Goals: Week 1: SLP Short Term Goal 1 (Week 1): STG=LTG due to ELOS   Skilled Therapeutic Interventions: Skilled treatment session focused on cognitive goals. SLP facilitated session by providing extra time and Min A verbal cues for functional problem solving during basic money management task. Patient's function impacted by decreased working memory and required Mod-Max A verbal cues for use of compensatory strategies due to impaired mental flexibility and awareness. Patient left upright in bed with alarm on and all needs within reach. Continue with current plan of care.           Pain No/Denies Pain  Therapy/Group: Individual Therapy  Tommaso Cavitt 11/25/2017, 2:58 PM

## 2017-11-26 DIAGNOSIS — G479 Sleep disorder, unspecified: Secondary | ICD-10-CM

## 2017-11-26 LAB — GLUCOSE, CAPILLARY
GLUCOSE-CAPILLARY: 207 mg/dL — AB (ref 70–99)
Glucose-Capillary: 227 mg/dL — ABNORMAL HIGH (ref 70–99)
Glucose-Capillary: 300 mg/dL — ABNORMAL HIGH (ref 70–99)
Glucose-Capillary: 271 mg/dL — ABNORMAL HIGH (ref 70–99)
Glucose-Capillary: 266 mg/dL — ABNORMAL HIGH (ref 70–99)

## 2017-11-26 MED ORDER — NON FORMULARY: 1.5000 mg | Freq: Every day | Status: DC

## 2017-11-26 MED ORDER — GABAPENTIN 100 MG PO CAPS
100.0000 mg | ORAL_CAPSULE | Freq: Three times a day (TID) | ORAL | Status: DC
  Administered 2017-11-26 – 2017-12-02 (×18): 100 mg via ORAL
  Filled 2017-11-26 (×18): qty 1

## 2017-11-26 MED ORDER — MELATONIN 3 MG PO TABS
1.5000 mg | ORAL_TABLET | Freq: Every day | ORAL | Status: DC
  Administered 2017-11-27 – 2017-12-01 (×5): 1.5 mg via ORAL
  Filled 2017-11-26 (×6): qty 0.5

## 2017-11-26 NOTE — Progress Notes (Signed)
Patient has home CPAP set up at bedside and ready for use. States he can place on himself. Made him aware if he changed his mind and needed assistance to have the RN to call RT.

## 2017-11-26 NOTE — Progress Notes (Signed)
[name] PHYSICAL MEDICINE & REHABILITATION PROGRESS NOTE  Subjective/Complaints:  Patient seen lying in bed this morning.  He states he slept fairly overnight.  His confusion appears to be improving.  He has for medications for his left foot and to help him sleep.  ROS:  + Left foot pain.  Denies CP, S OB, nausea, vomiting, diarrhea.  Objective: Vital Signs: Blood pressure 137/64, pulse 78, temperature 97.8 F (36.6 C), temperature source Oral, resp. rate 17, height 5\' 9"  (1.753 m), weight 133.9 kg, SpO2 96 %. No results found. No results for input(s): WBC, HGB, HCT, PLT in the last 72 hours. No results for input(s): NA, K, CL, CO2, GLUCOSE, BUN, CREATININE, CALCIUM in the last 72 hours.  Physical Exam: BP 137/64 (BP Location: Left Arm)   Pulse 78   Temp 97.8 F (36.6 C) (Oral)   Resp 17   Ht 5\' 9"  (1.753 m)   Wt 133.9 kg   SpO2 96%   BMI 43.59 kg/m  Constitutional: He appearswell-developed.  Obese. HENT: Normocephalicand atraumatic.  Eyes:EOMare normal.  No discharge.  Cardiovascular:RRR.  No JVD. Respiratory:Effort normal and breath sounds normal.  ZH:GDJM.Bowel sounds are normal.  Musculoskeletal: He exhibits  edema in lower extremities, stable.  He exhibits notenderness.  Neurological: He isalert and oriented x3, with confusion Kept left eye closed during exam--reports "habit".  Able to follow simple motor commands.  Motor:  LUE: 4+/5 proximal to distal, stable LLE: Hip flexion, knee extension 4/5, ankle dorsiflexion 2+/5, stable Skin: Skin iswarmand dry.  Psychiatric: His affect isblunt. His speech isdelayed.   Assessment/Plan: 1. Functional deficits secondary to debility which require 3+ hours per day of interdisciplinary therapy in a comprehensive inpatient rehab setting.  Physiatrist is providing close team supervision and 24 hour management of active medical problems listed below.  Physiatrist and rehab team continue to assess barriers  to discharge/monitor patient progress toward functional and medical goals  Care Tool:  Bathing  Bathing activity did not occur: Refused Body parts bathed by patient: Right arm, Left arm, Chest, Abdomen, Front perineal area, Right upper leg, Left upper leg, Face   Body parts bathed by helper: Front perineal area Body parts n/a: Buttocks   Bathing assist Assist Level: Minimal Assistance - Patient > 75%     Upper Body Dressing/Undressing Upper body dressing   What is the patient wearing?: Pull over shirt    Upper body assist Assist Level: Set up assist    Lower Body Dressing/Undressing Lower body dressing      What is the patient wearing?: Underwear/pull up, Pants     Lower body assist Assist for lower body dressing: Minimal Assistance - Patient > 75%     Toileting Toileting Toileting Activity did not occur (Clothing management and hygiene only): N/A (no void or bm)  Toileting assist Assist for toileting: Minimal Assistance - Patient > 75%     Transfers Chair/bed transfer  Transfers assist  Chair/bed transfer activity did not occur: N/A  Chair/bed transfer assist level: Contact Guard/Touching assist     Locomotion Ambulation   Ambulation assist   Ambulation activity did not occur: N/A  Assist level: Minimal Assistance - Patient > 75% Assistive device: Walker-platform Max distance: 143   Walk 10 feet activity   Assist  Walk 10 feet activity did not occur: Safety/medical concerns  Assist level: Supervision/Verbal cueing Assistive device: Walker-rolling   Walk 50 feet activity   Assist Walk 50 feet with 2 turns activity did not occur: Safety/medical concerns  Assist level: Supervision/Verbal cueing Assistive device: Walker-platform    Walk 150 feet activity   Assist Walk 150 feet activity did not occur: Safety/medical concerns         Walk 10 feet on uneven surface  activity   Assist Walk 10 feet on uneven surfaces activity did not  occur: Safety/medical concerns         Wheelchair     Assist   Type of Wheelchair: Manual    Wheelchair assist level: Supervision/Verbal cueing Max wheelchair distance: 120    Wheelchair 50 feet with 2 turns activity    Assist        Assist Level: Supervision/Verbal cueing   Wheelchair 150 feet activity     Assist     Assist Level: Supervision/Verbal cueing      Medical Problem List and Plan: 1. Weakness, limitation self-care secondary to debility.             Continue CIR 2. DVT Prophylaxis/Anticoagulation: Pharmaceutical: Lovenox 3. Pain Management: tylenol prn.   Gabapentin 100 3 times daily started on 10/6 4. Mood: LCSW to follow for evaluation and support.  5. Neuropsych: This patient is not capable of making decisions on his own behalf. 6. Skin/Wound Care: Routine pressure relief measures.  7. Fluids/Electrolytes/Nutrition: Strict I/O. Monitor weights daily. Low salt diet.  8. T2DM: Educated patient on dietary restrictions. Monitor blood sugars AC at bedtime. Titrate medications for tighter control             Lantus 30 units at bedtime  Lantus 5 units daily added on 10/5             NovoLog 3 units 3 times daily             SSI  Elevated on 10/6, will consider further increase medications tomorrow 9. HTN: Blood pressures twice daily.              Continue hydralazine 100 3 times daily             Relatively controlled on 10/6 10. Chronic renal failure: Will need work-up on hematuria and proteinuria after discharge.             Creatinine 1.15 on 10/3             Labs ordered for tomorrow 11. New onset seizures: Continue Keppra, Vimpat, Dilantin. LFTs within normal limits. Will have pharmacy follow for assistance/adjustment of dose.  12. Fluid overload: Resolving--likely due to illness and hypoalbuminemia. Continue to monitor daily weights. Changed glucerna to prostat to avoid hyperglycemia.                    Filed Weights    11/22/17 1600 11/22/17 1722 11/23/17 0420  Weight: 135.6 kg 135.6 kg 133 kg   13 Obesity hypoventilation syndrome: Encourage CPAP use at bedtime 14. Morbid Obesity: Consulted dietician for dietary education. Discussed importance of weight loss and activity to help promote health and mobility. Will need reinforcement.  15.  Hypoalbuminemia             Supplement initiated 16.  Hyponatremia             Sodium 134 on 10/3             Labs ordered for tomorrow             Continue to monitor 17.  Acute blood loss anemia             Hemoglobin  12.0 on 10/3             Labs ordered for tomorrow             Continue to monitor 18.  Sleep disturbance  Melatonin started on 10/6   LOS: 4 days A FACE TO FACE EVALUATION WAS PERFORMED  Daniel [name] 11/26/2017, 5:29 PM

## 2017-11-27 ENCOUNTER — Inpatient Hospital Stay (HOSPITAL_COMMUNITY): Payer: Medicaid Other | Admitting: Speech Pathology

## 2017-11-27 ENCOUNTER — Inpatient Hospital Stay (HOSPITAL_COMMUNITY): Payer: Medicaid Other | Admitting: Occupational Therapy

## 2017-11-27 ENCOUNTER — Inpatient Hospital Stay (HOSPITAL_COMMUNITY): Payer: Medicaid Other

## 2017-11-27 ENCOUNTER — Inpatient Hospital Stay (HOSPITAL_COMMUNITY): Payer: Medicaid Other | Admitting: Physical Therapy

## 2017-11-27 LAB — CBC WITH DIFFERENTIAL/PLATELET
ABS IMMATURE GRANULOCYTES: 0 10*3/uL (ref 0.0–0.1)
Basophils Absolute: 0 10*3/uL (ref 0.0–0.1)
Basophils Relative: 0 %
Eosinophils Absolute: 0.4 10*3/uL (ref 0.0–0.7)
Eosinophils Relative: 4 %
HCT: 43.2 % (ref 39.0–52.0)
Hemoglobin: 13.3 g/dL (ref 13.0–17.0)
IMMATURE GRANULOCYTES: 0 %
LYMPHS PCT: 26 %
Lymphs Abs: 2.5 10*3/uL (ref 0.7–4.0)
MCH: 28 pg (ref 26.0–34.0)
MCHC: 30.8 g/dL (ref 30.0–36.0)
MCV: 90.9 fL (ref 78.0–100.0)
Monocytes Absolute: 0.6 10*3/uL (ref 0.1–1.0)
Monocytes Relative: 6 %
Neutro Abs: 6.1 10*3/uL (ref 1.7–7.7)
Neutrophils Relative %: 64 %
PLATELETS: 275 10*3/uL (ref 150–400)
RBC: 4.75 MIL/uL (ref 4.22–5.81)
RDW: 15.4 % (ref 11.5–15.5)
WBC: 9.7 10*3/uL (ref 4.0–10.5)

## 2017-11-27 LAB — BASIC METABOLIC PANEL
ANION GAP: 12 (ref 5–15)
BUN: 20 mg/dL (ref 6–20)
CO2: 25 mmol/L (ref 22–32)
Calcium: 8.7 mg/dL — ABNORMAL LOW (ref 8.9–10.3)
Chloride: 98 mmol/L (ref 98–111)
Creatinine, Ser: 1.43 mg/dL — ABNORMAL HIGH (ref 0.61–1.24)
GFR, EST NON AFRICAN AMERICAN: 55 mL/min — AB (ref >60)
Glucose, Bld: 197 mg/dL — ABNORMAL HIGH (ref 70–99)
Potassium: 4 mmol/L (ref 3.5–5.1)
SODIUM: 135 mmol/L (ref 135–145)

## 2017-11-27 LAB — GLUCOSE, CAPILLARY
GLUCOSE-CAPILLARY: 136 mg/dL — AB (ref 70–99)
GLUCOSE-CAPILLARY: 309 mg/dL — AB (ref 70–99)
GLUCOSE-CAPILLARY: 158 mg/dL — AB (ref 70–99)
Glucose-Capillary: 199 mg/dL — ABNORMAL HIGH (ref 70–99)

## 2017-11-27 NOTE — Progress Notes (Signed)
Occupational Therapy Session Note  Patient Details  Name: Daniel [name] MRN: [id number] Date of Birth: 01-02-1966  Today's Date: 11/27/2017 OT Individual Time: 6606-0045 OT Individual Time Calculation (min): 57 min    Short Term Goals: Week 1:  OT Short Term Goal 1 (Week 1): Pt will complete LB bathing sit to stand with supervision and AE PRN. OT Short Term Goal 2 (Week 1): Pt will complete LB dressing sit to stand with supervision and AE. OT Short Term Goal 3 (Week 1): Pt will complete toilet transfer with supervision using RW to elevated toilet or 3:1.   Skilled Therapeutic Interventions/Progress Updates:    Pt declined work on bathing and dressing.  Pt already dressed with wife present at beginning of session.  Pt reporting being tired of being here in the hospital and also having lots of noise outside of the room this morning, not allowing him to rest as well.  Pt's spouse left to go home and run errands.  When therapist went to get the RW for pt to use with transfer to the wheelchair, just outside of the room, he stated he didn't need it and had not been using it this weekend, even though notes reflected that he had.  Therapist donned gait belt and hand pt stand up without walker.  He demonstrated LOB immediately requiring mod assist.  Therapist then had him walk toward the door, which required min assist for 2-3 steps then he demonstrated LOB to the right, which therapist helped guide him down to the couch in his room.  Re-directed pt to the need for the RW, and he agreed.  Had him ambulate to the nurses station with min assist.  Once there he was able to rest in the wheelchair.  Took pt to the tub shower room where he practiced tub shower transfers using the tub bench.  He was able to complete with min assist.  He reports having a hand held shower at home but may wait and just sponge bathe, as they cannot afford a bench at this time.  Educated pt on the use of online resources as well to get  equipment.  Next had pt work on static and dynamic standing balance at the gym.  Had pt stand in front of the mat with feet closer together as well as normal stance with eyes closed.  LOB X 2 backwards secondary to increased posterior lean, requiring min assist to correct.  When resting, pt became tearful saying he feels like a "sissy" now and can't do anything anymore.  Therapist provided encouragement that he would improve and that it would take time and hard work, but he would be able to do it.  Had pt transfer back to the wheelchair after a few more trails of standing with return to the room via wheelchair.  Call button and phone in reach with safety belt alarm in place.  Will benefit from neuropsych visit.    Therapy Documentation Precautions:  Precautions Precautions: Fall Restrictions Weight Bearing Restrictions: No  Pain: Pain Assessment Pain Scale: Faces Pain Score: 0-No pain ADL: See Care Plan Section  Therapy/Group: Individual Therapy  Daniel [name] OTR/L 11/27/2017, 12:14 PM

## 2017-11-27 NOTE — Progress Notes (Signed)
Patient refused to wear BiPAP RT will continue to monitor throughout the night as needed.

## 2017-11-27 NOTE — Progress Notes (Signed)
MEDICATION RELATED CONSULT NOTE - Follow-up  Pharmacy Consult for Phenytoin Indication: Status epilepticus / seizures  Allergies  Allergen Reactions  . Montelukast Other (See Comments)    Possibly cause headaches per spouse   Patient Measurements: Height: 5\' 9"  (175.3 cm) Weight: 291 lb 10.7 oz (132.3 kg) IBW/kg (Calculated) : 70.7  Vital Signs: Temp: 98 F (36.7 C) (10/07 0630) Temp Source: Oral (10/07 0630) BP: 143/82 (10/07 0630) Pulse Rate: 71 (10/07 0630)  Labs: No results for input(s): WBC, HGB, HCT, PLT, APTT, CREATININE, LABCREA, CREATININE, CREAT24HRUR, MG, PHOS, ALBUMIN, PROT, ALBUMIN, AST, ALT, ALKPHOS, BILITOT, BILIDIR, IBILI in the last 72 hours. Estimated Creatinine Clearance: 100.1 mL/min (by C-G formula based on SCr of 1.15 mg/dL).  Assessment: 52 yo M presented with status epilepticus. Started on several anti-seizure medications. Seizure medications switched to po 9/25 to prepare for discharge. Remains stable without seizure activity.    Goal of Therapy:  Total Phenytoin level 10-20 mcg/ml Prevention of seizures  Plan:  Continue phenytoin 400mg  PO qAM and 300mg  PO qPM Monitor clinical picture Consider total phenytoin level if needed  Elenor Quinones, PharmD, BCPS Clinical Pharmacist Phone number [phone] 11/27/2017 8:10 AM

## 2017-11-27 NOTE — Progress Notes (Signed)
[facility] PH[facility]ICAL MEDICINE & REHABILITATION PROGRESS NOTE  Subjective/Complaints:  Patient seen sitting up in the edge of his bed this morning.  He states he slept fairly overnight.  He is extremely agitated and upset because he states that there was noise outside of his room this morning that woke him up.  ROS:  Denies CP, S OB, nausea, vomiting, diarrhea.  Objective: Vital Signs: Blood pressure (!) 143/82, pulse 71, temperature 98 F (36.7 C), temperature source Oral, resp. rate 16, height 5\' 9"  (1.753 m), weight 132.3 kg, SpO2 96 %. No results found. No results for input(s): WBC, HGB, HCT, PLT in the last 72 hours. No results for input(s): NA, K, CL, CO2, GLUCOSE, BUN, CREATININE, CALCIUM in the last 72 hours.  Physical Exam: BP (!) 143/82 (BP Location: Left Arm)   Pulse 71   Temp 98 F (36.7 C) (Oral)   Resp 16   Ht 5\' 9"  (1.753 m)   Wt 132.3 kg   SpO2 96%   BMI 43.07 kg/m  Constitutional: He appearswell-developed.  Obese. HENT: Normocephalicand atraumatic.  Eyes:EOMare normal.  No discharge.  Cardiovascular:RRR.  No JVD. Respiratory:Effort normal and breath sounds normal.  PP:IRJJ.Bowel sounds are normal.  Musculoskeletal: He exhibits  edema in lower extremities, stable.  He exhibits notenderness.  Neurological: He isalert and oriented x3, with cues, states that he does not "keep up with those types of things" Able to follow simple motor commands.  Motor:  LUE: 4+/5 proximal to distal, stable LLE: Hip flexion, knee extension 4/5, ankle dorsiflexion 3+/5 Skin: Skin iswarmand dry.  Psychiatric: His affect isblunt. His speech isdelayed.   Assessment/Plan: 1. Functional deficits secondary to debility which require 3+ hours per day of interdisciplinary therapy in a comprehensive inpatient rehab setting.  Physiatrist is providing close team supervision and 24 hour management of active medical problems listed below.  Physiatrist and rehab team  continue to assess barriers to discharge/monitor patient progress toward functional and medical goals  Care Tool:  Bathing  Bathing activity did not occur: Refused Body parts bathed by patient: Right arm, Left arm, Chest, Abdomen, Front perineal area, Right upper leg, Left upper leg, Face   Body parts bathed by helper: Front perineal area Body parts n/a: Buttocks   Bathing assist Assist Level: Minimal Assistance - Patient > 75%     Upper Body Dressing/Undressing Upper body dressing   What is the patient wearing?: Pull over shirt    Upper body assist Assist Level: Set up assist    Lower Body Dressing/Undressing Lower body dressing      What is the patient wearing?: Underwear/pull up, Pants     Lower body assist Assist for lower body dressing: Minimal Assistance - Patient > 75%     Toileting Toileting Toileting Activity did not occur (Clothing management and hygiene only): N/A (no void or bm)  Toileting assist Assist for toileting: Minimal Assistance - Patient > 75%     Transfers Chair/bed transfer  Transfers assist  Chair/bed transfer activity did not occur: N/A  Chair/bed transfer assist level: Contact Guard/Touching assist     Locomotion Ambulation   Ambulation assist   Ambulation activity did not occur: N/A  Assist level: Minimal Assistance - Patient > 75% Assistive device: Walker-platform Max distance: 143   Walk 10 feet activity   Assist  Walk 10 feet activity did not occur: Safety/medical concerns  Assist level: Supervision/Verbal cueing Assistive device: Walker-rolling   Walk 50 feet activity   Assist Walk 50 feet with  2 turns activity did not occur: Safety/medical concerns  Assist level: Supervision/Verbal cueing Assistive device: Walker-platform    Walk 150 feet activity   Assist Walk 150 feet activity did not occur: Safety/medical concerns         Walk 10 feet on uneven surface  activity   Assist Walk 10 feet on uneven  surfaces activity did not occur: Safety/medical concerns         Wheelchair     Assist   Type of Wheelchair: Manual    Wheelchair assist level: Supervision/Verbal cueing Max wheelchair distance: 120    Wheelchair 50 feet with 2 turns activity    Assist        Assist Level: Supervision/Verbal cueing   Wheelchair 150 feet activity     Assist     Assist Level: Supervision/Verbal cueing      Medical Problem List and Plan: 1. Weakness, limitation self-care secondary to debility.             Continue CIR 2. DVT Prophylaxis/Anticoagulation: Pharmaceutical: Lovenox 3. Pain Management: tylenol prn.   Gabapentin 100 3 times daily started on 10/6  Improving 4. Mood: LCSW to follow for evaluation and support.  5. Neuropsych: This patient is not capable of making decisions on his own behalf. 6. Skin/Wound Care: Routine pressure relief measures.  7. Fluids/Electrolytes/Nutrition: Strict I/O. Monitor weights daily. Low salt diet.  8. T2DM: Educated patient on dietary restrictions. Monitor blood sugars AC at bedtime. Titrate medications for tighter control             Lantus 30 units at bedtime  Lantus 5 units daily added on 10/5             NovoLog 3 units 3 times daily             SSI  Labile on 10/7, likely due to diet over the weekend 9. HTN: Blood pressures twice daily.              Continue hydralazine 100 3 times daily             Relatively controlled on 10/7 10. Chronic renal failure: Will need work-up on hematuria and proteinuria after discharge.             Creatinine 1.15 on 10/3             Labs ordered for pending 11. New onset seizures: Continue Keppra, Vimpat, Dilantin. LFTs within normal limits. Will have pharmacy follow for assistance/adjustment of dose.  12. Fluid overload: Resolving--likely due to illness and hypoalbuminemia. Continue to monitor daily weights. Changed glucerna to prostat to avoid hyperglycemia.                     Filed Weights   11/22/17 1600 11/22/17 1722 11/23/17 0420  Weight: 135.6 kg 135.6 kg 133 kg   13 Obesity hypoventilation syndrome: Encourage CPAP use at bedtime 14. Morbid Obesity: Consulted dietician for dietary education. Discussed importance of weight loss and activity to help promote health and mobility. Will need reinforcement.  15.  Hypoalbuminemia             Supplement initiated 16.  Hyponatremia             Sodium 134 on 10/3             Labs ordered for pending             Continue to monitor 17.  Acute blood loss anemia  Hemoglobin 12.0 on 10/3             Labs ordered for pending             Continue to monitor 18.  Sleep disturbance  Melatonin started on 10/6   LOS: 5 days A FACE TO FACE EVALUATION WAS PERFORMED   Lorie Phenix 11/27/2017, 8:18 AM

## 2017-11-27 NOTE — Progress Notes (Signed)
Physical Therapy Session Note  Patient Details  Name: Daniel [name] MRN: [id number] Date of Birth: 1965-04-03  Today's Date: 11/27/2017 PT Individual Time: 1000-1100 PT Individual Time Calculation (min): 60 min   Short Term Goals: Week 1:  PT Short Term Goal 1 (Week 1): STG=LTG due ELOS  Skilled Therapeutic Interventions/Progress Updates:  Pt presented in w/c agreeable to therapy. Pt stating increased difficulty walking today no c/o pain just that legs felt weak. Pt propelled w/c to rehab gym with x 2 breaks for increased endurance. Performed stand pivot transfer mat minA with cues for hand placement and safety. Participated in STS x 5 for BLE strengthening and reinforcement of hand safety with transfers. Performed toe taps to 4in step for wt shifting and endurance. Pt required frequent breaks due to fatigue. Performed seated and standing BLE therex including LAQ with 3# cuff, standing hip abd/add, hip flexion x 10 bilaterally. Pt performed standing tolerance with RW while playing game of horseshoes. PTA noted RLE shaking then R knee flexed. Pt with LOB posteriorlaterally with PTA and second therapist providing total A back to mat. After seated rest pt performed stand pivot CGA to w/c and propelled back to room. Pt required frequent re-direction throughout session to attain to task. Pt remained in w/c at end of session with belt alarm on, current needs met, and lab tech present for blood draw.      Therapy Documentation Precautions:  Precautions Precautions: Fall Restrictions Weight Bearing Restrictions: No General:   Vital Signs: Therapy Vitals Temp: (!) 97.5 F (36.4 C) Temp Source: Oral Pulse Rate: 72 Resp: 20 BP: 128/65 Patient Position (if appropriate): Lying Oxygen Therapy SpO2: 95 % O2 Device: Room Air Pain: Pain Assessment Pain Scale: 0-10 Pain Score: 0-No pain Mobility:   Locomotion :    Trunk/Postural Assessment :    Balance:   Exercises:   Other Treatments:       Therapy/Group: Individual Therapy  Cylee Dattilo 11/27/2017, 4:00 PM

## 2017-11-27 NOTE — Progress Notes (Signed)
Speech Language Pathology Daily Session Note  Patient Details  Name: ETHER WOLTERS MRN: [id number] Date of Birth: [birth year]  Today's Date: 11/27/2017 SLP Individual Time: 1130-1200 SLP Individual Time Calculation (min): 30 min  Short Term Goals: Week 1: SLP Short Term Goal 1 (Week 1): STG=LTG due to ELOS   Skilled Therapeutic Interventions:Skilled ST services focused on cognitive skills. SLP facilitated problem solving skills and recall compensatory strategies (written aid) utilizing basic money management tasks, pt required  Mod A cues, however only completed one task due to pt's internal distarctions. Pt became upset about cognitive and physical changes, unable to be redirected to task. Pt began to cry fixated on not being able to provide for his grandchildren, planned a college fund. SLP provided extensive emotional support, strategies to block out negative thoughts and predicating future events, but instead focus on the task at hand and improving at presented level. Pt agreed to focus on the request task in upcoming OT session. SLP communicated with OT. Pt was left in room with call bell within reach and bed alarm set. Recommend to continue skilled ST services.      Function:   Pain Pain Assessment Pain Score: 0-No pain  Therapy/Group: Individual Therapy  Sherryll Skoczylas  [facility] 11/27/2017, 3:06 PM

## 2017-11-27 NOTE — Progress Notes (Signed)
Occupational Therapy Session Note  Patient Details  Name: Daniel [name] MRN: [id number] Date of Birth: [birth year]  Today's Date: 11/27/2017 OT Individual Time: 3419-6222 OT Individual Time Calculation (min): 53 min    Short Term Goals: Week 1:  OT Short Term Goal 1 (Week 1): Pt will complete LB bathing sit to stand with supervision and AE PRN. OT Short Term Goal 2 (Week 1): Pt will complete LB dressing sit to stand with supervision and AE. OT Short Term Goal 3 (Week 1): Pt will complete toilet transfer with supervision using RW to elevated toilet or 3:1.   Skilled Therapeutic Interventions/Progress Updates:    Session focused on sit <> stand transfers, functional activity tolerance, and functional reaching. Upon entering room and providing pt with tx options, pt becoming inappropriate and sexually suggestive with therapist in re to shower. With cueing pt able to be redirected to task outside of room. Pt completed bed mobility with CGA and required CGA to stand and pivot to w/c. Pt completed sit <>stand from therapy mat x5 with no over LOB. Pt completed functional reaching distally, crossing midline and then throwing bean bags anteriorly with 90% accuracy to target. Pt returned to room and engaged in d/c planning/discussion with this OT and wife. Pt left sitting up in w/c with wife present and all needs met.   Therapy Documentation Precautions:  Precautions Precautions: Fall Restrictions Weight Bearing Restrictions: No   Vital Signs: Therapy Vitals Temp: (!) 97.5 F (36.4 C) Temp Source: Oral Pulse Rate: 72 Resp: 20 BP: 128/65 Patient Position (if appropriate): Lying Oxygen Therapy SpO2: 95 % O2 Device: Room Air Pain: Pain Assessment Pain Scale: 0-10 Pain Score: 0-No pain  Therapy/Group: Individual Therapy  Curtis Sites 11/27/2017, 3:30 PM

## 2017-11-28 ENCOUNTER — Inpatient Hospital Stay (HOSPITAL_COMMUNITY): Payer: Medicaid Other | Admitting: Physical Therapy

## 2017-11-28 ENCOUNTER — Inpatient Hospital Stay (HOSPITAL_COMMUNITY): Payer: Medicaid Other

## 2017-11-28 ENCOUNTER — Inpatient Hospital Stay (HOSPITAL_COMMUNITY): Payer: Medicaid Other | Admitting: Occupational Therapy

## 2017-11-28 DIAGNOSIS — R7309 Other abnormal glucose: Secondary | ICD-10-CM | POA: Insufficient documentation

## 2017-11-28 DIAGNOSIS — G479 Sleep disorder, unspecified: Secondary | ICD-10-CM

## 2017-11-28 DIAGNOSIS — R609 Edema, unspecified: Secondary | ICD-10-CM

## 2017-11-28 LAB — GLUCOSE, CAPILLARY
Glucose-Capillary: 142 mg/dL — ABNORMAL HIGH (ref 70–99)
Glucose-Capillary: 133 mg/dL — ABNORMAL HIGH (ref 70–99)
Glucose-Capillary: 133 mg/dL — ABNORMAL HIGH (ref 70–99)
Glucose-Capillary: 218 mg/dL — ABNORMAL HIGH (ref 70–99)

## 2017-11-28 MED ORDER — FUROSEMIDE 40 MG PO TABS
40.0000 mg | ORAL_TABLET | Freq: Once | ORAL | Status: AC
  Administered 2017-11-28: 40 mg via ORAL
  Filled 2017-11-28: qty 1

## 2017-11-28 NOTE — Progress Notes (Signed)
Patient was unavailable for breathing treatment earlier.  When I went back later to administer breathing treatment, patient stated that he would have the RN call me if he decided he needed the treatment.  RT returned treatment to pyxis.

## 2017-11-28 NOTE — Progress Notes (Signed)
Occupational Therapy Session Note  Patient Details  Name: Daniel [name] MRN: [id number] Date of Birth: Nov 17, 1965  Today's Date: 11/28/2017 OT Individual Time: 9450-3888 OT Individual Time Calculation (min): 56 min    Short Term Goals: Week 1:  OT Short Term Goal 1 (Week 1): Pt will complete LB bathing sit to stand with supervision and AE PRN. OT Short Term Goal 2 (Week 1): Pt will complete LB dressing sit to stand with supervision and AE. OT Short Term Goal 3 (Week 1): Pt will complete toilet transfer with supervision using RW to elevated toilet or 3:1.   Skilled Therapeutic Interventions/Progress Updates:    Pt completed dressing sit to stand from the EOB.  He declines shower as he does not want to shower in front on anyone.  Wife present and states that she is worried about this at home.  Discussed need for shower seat and had pt's spouse stay for therapy sessions.  He donned all UB clothing with setup and LB clothing sit to stand with min assist.  Pt donned shoes without tying and let the shoe tongue go down into the shoe instead of adjusting it.  Re-directed pt about this but he stated "It would be alright, I can't feel good in my feet anyway."  Therapist discussed the importance of placing shoes on correctly as he is diabetic and it can lead to issues with wounds or abrasions.  Spouse and pt voice understanding of this, but unsure how much will carry over with the patient.  He next ambulated down the hallway with min assist and use of the RW.  He needed a rest break after approximately 50 ft, so wheelchair was brought behind him.  Next had pt complete tub transfer using the tub bench, so spouse could see how he completes it.  Next took pt to the therapy gym where he worked on standing balance and endurance from the mat while engaged in reaching task.  Pt still with occasional left knee flexion noted in standing, but not severe.  LOB posteriorly at times with reaching requiring min assist to  correct.  Pt with negative statements about his current situation and progress at times.  Both therapist and spouse providing positive aspects of his recovery, however he is resistant to acknowledging them.  Returned to room at end of session with functional mobility X 2 secondary to pt needing rest break half way.  Pt left in wheelchair with spouse present.  Belt alarm left off as spouse will supervise and stay for PT session per her report.    Therapy Documentation Precautions:  Precautions Precautions: Fall Restrictions Weight Bearing Restrictions: No  Pain: Pain Assessment Pain Score: 0-No pain ADL: See Care Plan Section for details   Therapy/Group: Individual Therapy  Kaliah Haddaway OTR/L 11/28/2017, 9:09 AM

## 2017-11-28 NOTE — Progress Notes (Signed)
Pt refused PRN Miralax and PRN dulcolax; educated

## 2017-11-28 NOTE — Progress Notes (Signed)
Pt c/o constipation and pain from attempt to pass stool; disimpaction attempt unsuccessful; no results from miralax and fleet enema administration; on-call contacted; recommended soap suds enema; will continue to monitor for effectiveness

## 2017-11-28 NOTE — Progress Notes (Signed)
[place] PHYSICAL MEDICINE & REHABILITATION PROGRESS NOTE  Subjective/Complaints:  Patient seen laying in bed this morning.  He states he slept fairly overnight.  He is looking forward to going home tomorrow.  ROS:  Denies CP, S OB, nausea, vomiting, diarrhea.  Objective: Vital Signs: Blood pressure (!) 146/76, pulse 69, temperature 97.6 F (36.4 C), temperature source Oral, resp. rate 18, height 5\' 9"  (1.753 m), weight 134.3 kg, SpO2 98 %. No results found. Recent Labs    11/27/17 1107  WBC 9.7  HGB 13.3  HCT 43.2  PLT 275   Recent Labs    11/27/17 1107  NA 135  K 4.0  CL 98  CO2 25  GLUCOSE 197*  BUN 20  CREATININE 1.43*  CALCIUM 8.7*    Physical Exam: BP (!) 146/76 (BP Location: Left Arm)   Pulse 69   Temp 97.6 F (36.4 C) (Oral)   Resp 18   Ht 5\' 9"  (1.753 m)   Wt 134.3 kg   SpO2 98%   BMI 43.72 kg/m  Constitutional: He appearswell-developed.  Obese. HENT: Normocephalicand atraumatic.  Eyes:EOMare normal.  No discharge.  Cardiovascular:RRR.  No JVD. Respiratory:Effort normal and breath sounds normal.  XJ:OITG.Bowel sounds are normal.  Musculoskeletal: He exhibits  edema in lower extremities, stable.  He exhibits notenderness.  Neurological: He isalert and oriented, except for date of month Able to follow simple motor commands.  Motor:  LUE: 4+/5 proximal to distal, stable LLE: Hip flexion, knee extension 4/5, ankle dorsiflexion 3+/5, stable Skin: Skin iswarmand dry.  Psychiatric: His affect isblunt.  Agitated.   Assessment/Plan: 1. Functional deficits secondary to debility which require 3+ hours per day of interdisciplinary therapy in a comprehensive inpatient rehab setting.  Physiatrist is providing close team supervision and 24 hour management of active medical problems listed below.  Physiatrist and rehab team continue to assess barriers to discharge/monitor patient progress toward functional and medical goals  Care  Tool:  Bathing  Bathing activity did not occur: Refused Body parts bathed by patient: Right arm, Left arm, Chest, Abdomen, Front perineal area, Right upper leg, Left upper leg, Face   Body parts bathed by helper: Front perineal area Body parts n/a: Buttocks   Bathing assist Assist Level: Minimal Assistance - Patient > 75%     Upper Body Dressing/Undressing Upper body dressing   What is the patient wearing?: Pull over shirt    Upper body assist Assist Level: Set up assist    Lower Body Dressing/Undressing Lower body dressing      What is the patient wearing?: Underwear/pull up, Pants     Lower body assist Assist for lower body dressing: Minimal Assistance - Patient > 75%     Toileting Toileting Toileting Activity did not occur (Clothing management and hygiene only): N/A (no void or bm)  Toileting assist Assist for toileting: Minimal Assistance - Patient > 75%     Transfers Chair/bed transfer  Transfers assist     Chair/bed transfer assist level: Contact Guard/Touching assist     Locomotion Ambulation   Ambulation assist   Ambulation activity did not occur: Safety/medical concerns  Assist level: Minimal Assistance - Patient > 75% Assistive device: Walker-rolling Max distance: 50'   Walk 10 feet activity   Assist  Walk 10 feet activity did not occur: Safety/medical concerns  Assist level: Minimal Assistance - Patient > 75% Assistive device: Walker-rolling   Walk 50 feet activity   Assist Walk 50 feet with 2 turns activity did not occur:  Safety/medical concerns  Assist level: Supervision/Verbal cueing Assistive device: Walker-platform    Walk 150 feet activity   Assist Walk 150 feet activity did not occur: Safety/medical concerns         Walk 10 feet on uneven surface  activity   Assist Walk 10 feet on uneven surfaces activity did not occur: Safety/medical concerns         Wheelchair     Assist   Type of Wheelchair:  Manual    Wheelchair assist level: Supervision/Verbal cueing Max wheelchair distance: 120    Wheelchair 50 feet with 2 turns activity    Assist        Assist Level: Supervision/Verbal cueing   Wheelchair 150 feet activity     Assist     Assist Level: Supervision/Verbal cueing      Medical Problem List and Plan: 1. Weakness, limitation self-care secondary to debility.             Continue CIR  Plan for d/c tomorrow  Will see patient for transitional care management in 1-2 weeks post-discharge 2. DVT Prophylaxis/Anticoagulation: Pharmaceutical: Lovenox 3. Pain Management: tylenol prn.   Gabapentin 100 3 times daily started on 10/6  Improved  4. Mood: LCSW to follow for evaluation and support.  5. Neuropsych: This patient is not capable of making decisions on his own behalf. 6. Skin/Wound Care: Routine pressure relief measures.  7. Fluids/Electrolytes/Nutrition: Strict I/O. Monitor weights daily. Low salt diet.  8. T2DM: Educated patient on dietary restrictions. Monitor blood sugars AC at bedtime. Titrate medications for tighter control             Lantus 30 units at bedtime  Lantus 5 units daily added on 10/5             NovoLog 3 units 3 times daily             SSI  Labile on 10/8 9. HTN: Blood pressures twice daily.              Continue hydralazine 100 3 times daily             Relatively controlled on 10/8 10. Chronic renal failure: Will need work-up on hematuria and proteinuria after discharge.             Creatinine 1.43 on 10/7  Encourage fluids 11. New onset seizures: Continue Keppra, Vimpat, Dilantin. LFTs within normal limits. Will have pharmacy follow for assistance/adjustment of dose.  12. Fluid overload: Likely due to illness and hypoalbuminemia. Continue to monitor daily weights. Changed glucerna to prostat to avoid hyperglycemia.               Filed Weights   11/26/17 0500 11/27/17 0500 11/28/17 0500  Weight: 133.9 kg 132.3 kg 134.3  kg   Stable on 10/8  Lasix 40 mg x1 ordered on 10/8 13 Obesity hypoventilation syndrome: Encourage CPAP use at bedtime 14. Morbid Obesity: Consulted dietician for dietary education. Discussed importance of weight loss and activity to help promote health and mobility. Will need reinforcement.  15.  Hypoalbuminemia             Supplement initiated 16.  Hyponatremia             Sodium 135 on 10/7             Continue to monitor 17.  Acute blood loss anemia: Resolved  Hemoglobin 13.3 on 10/7             Continue  to monitor 18.  Sleep disturbance  Melatonin started on 10/6   LOS: 6 days A FACE TO FACE EVALUATION WAS PERFORMED  Ankit Lorie Phenix 11/28/2017, 8:03 AM

## 2017-11-28 NOTE — Progress Notes (Signed)
Physical Therapy Session Note  Patient Details  Name: Daniel [name] MRN: [id number] Date of Birth: September 07, 1965  Today's Date: 11/28/2017 PT Individual Time: 1000-1100 PT Individual Time Calculation (min): 60 min   Short Term Goals: Week 1:  PT Short Term Goal 1 (Week 1): STG=LTG due ELOS  Skilled Therapeutic Interventions/Progress Updates: Pt presented in w/c in some emotional distress due to current changes. PTA provided emotional support and provided edu to pt and wife regarding current progress and anticipated level of assist required at d/c. Pt then required some re-direction regarding participating in therapy. Pt ambulated 83f with RW CGA with verbal cues for staying within RW. Pt noted to have increased R knee instability with fatigue.  Transported to ADL apt and discussed home set up in bedroom. Pt performed bed mobility in standard bed with CGA fading to supervision with multiple attempts. Pt then ambulated from ADL apt to door of room in same manner as prior. Pt remained in room at end of session and left with chair belt in place, call button within reach and needs met.      Therapy Documentation Precautions:  Precautions Precautions: Fall Restrictions Weight Bearing Restrictions: No General:   Vital Signs: Therapy Vitals Temp: 98 F (36.7 C) Temp Source: Oral Pulse Rate: 72 Resp: 19 BP: 126/74 Patient Position (if appropriate): Lying Oxygen Therapy SpO2: 99 % O2 Device: Room Air    Therapy/Group: Individual Therapy  Maisyn Nouri  Himani Corona, PTA  11/28/2017, 4:00 PM

## 2017-11-28 NOTE — Progress Notes (Signed)
Speech Language Pathology Daily Session Note  Patient Details  Name: Daniel [name] MRN: [id number] Date of Birth: 02-05-1966  Today's Date: 11/28/2017 SLP Individual Time: 1420-1530 SLP Individual Time Calculation (min): 70 min  Short Term Goals: Week 1: SLP Short Term Goal 1 (Week 1): STG=LTG due to ELOS   Skilled Therapeutic Interventions:  Pt was seen for skilled ST targeting cognitive goals.  Pt needed cues for safety when transferring out of bed due to impulsivity and almost slipping off the side of the bed when attempting to put on his shoes.  Pt again today began perseverating on negative self talk which detracted from his ability to initially participate in therapy.  However, with firm redirection and encouragement by giving examples of pt's progress, he was more agreeable to participating in therapy.  SLP facilitated the session with a novel card game to address attention, working memory, and problem solving goals.  Pt initially needed min assist cues to plan and execute a problem solving strategy, but as task progressed therapist was able to fade cues to supervision.  Pt was returned to room and left in bed with bed alarm set and call bell within reach.  Continue per current plan of care.        Pain Pain Assessment Pain Scale: 0-10 Pain Score: 0-No pain  Therapy/Group: Individual Therapy  Judine Arciniega, Selinda Orion 11/28/2017, 9:06 PM

## 2017-11-28 NOTE — Progress Notes (Signed)
RT asked about patient wearing his Bipap.  Patient stated that he would let RN know to give me a call if he decided to wear his Bipap.

## 2017-11-29 ENCOUNTER — Inpatient Hospital Stay (HOSPITAL_COMMUNITY): Payer: Medicaid Other | Admitting: Speech Pathology

## 2017-11-29 ENCOUNTER — Encounter (HOSPITAL_COMMUNITY): Payer: Medicaid Other | Admitting: Psychology

## 2017-11-29 ENCOUNTER — Inpatient Hospital Stay (HOSPITAL_COMMUNITY): Payer: Medicaid Other | Admitting: Occupational Therapy

## 2017-11-29 ENCOUNTER — Inpatient Hospital Stay (HOSPITAL_COMMUNITY): Payer: Medicaid Other

## 2017-11-29 ENCOUNTER — Inpatient Hospital Stay (HOSPITAL_COMMUNITY): Payer: Medicaid Other | Admitting: *Deleted

## 2017-11-29 ENCOUNTER — Inpatient Hospital Stay (HOSPITAL_COMMUNITY): Payer: Medicaid Other | Admitting: Physical Therapy

## 2017-11-29 LAB — CBC
HEMATOCRIT: 39.8 % (ref 39.0–52.0)
Hemoglobin: 12.2 g/dL — ABNORMAL LOW (ref 13.0–17.0)
MCH: 27.2 pg (ref 26.0–34.0)
MCHC: 30.7 g/dL (ref 30.0–36.0)
MCV: 88.6 fL (ref 80.0–100.0)
NRBC: 0 % (ref 0.0–0.2)
PLATELETS: 234 10*3/uL (ref 150–400)
RBC: 4.49 MIL/uL (ref 4.22–5.81)
RDW: 15.4 % (ref 11.5–15.5)
WBC: 9.3 10*3/uL (ref 4.0–10.5)

## 2017-11-29 LAB — BASIC METABOLIC PANEL
ANION GAP: 9 (ref 5–15)
BUN: 21 mg/dL — ABNORMAL HIGH (ref 6–20)
CALCIUM: 8.6 mg/dL — AB (ref 8.9–10.3)
CO2: 30 mmol/L (ref 22–32)
CREATININE: 1.33 mg/dL — AB (ref 0.61–1.24)
Chloride: 100 mmol/L (ref 98–111)
GFR, EST NON AFRICAN AMERICAN: 60 mL/min — AB (ref >60)
Glucose, Bld: 163 mg/dL — ABNORMAL HIGH (ref 70–99)
Potassium: 4.2 mmol/L (ref 3.5–5.1)
SODIUM: 139 mmol/L (ref 135–145)

## 2017-11-29 LAB — GLUCOSE, CAPILLARY
GLUCOSE-CAPILLARY: 165 mg/dL — AB (ref 70–99)
GLUCOSE-CAPILLARY: 152 mg/dL — AB (ref 70–99)
Glucose-Capillary: 143 mg/dL — ABNORMAL HIGH (ref 70–99)
Glucose-Capillary: 147 mg/dL — ABNORMAL HIGH (ref 70–99)

## 2017-11-29 MED ORDER — ACETAMINOPHEN 325 MG PO TABS: 325.0000 mg | ORAL_TABLET | ORAL | Status: AC | PRN

## 2017-11-29 MED ORDER — INSULIN GLARGINE 100 UNIT/ML ~~LOC~~ SOLN
10.0000 [IU] | Freq: Every day | SUBCUTANEOUS | Status: DC
  Administered 2017-11-30 – 2017-12-02 (×3): 10 [IU] via SUBCUTANEOUS
  Filled 2017-11-29 (×3): qty 0.1

## 2017-11-29 MED ORDER — POLYETHYLENE GLYCOL 3350 17 G PO PACK
17.0000 g | PACK | Freq: Two times a day (BID) | ORAL | Status: DC
  Administered 2017-11-29 – 2017-12-02 (×6): 17 g via ORAL
  Filled 2017-11-29 (×7): qty 1

## 2017-11-29 MED ORDER — INSULIN GLARGINE 100 UNIT/ML ~~LOC~~ SOLN
5.0000 [IU] | Freq: Once | SUBCUTANEOUS | Status: AC
  Administered 2017-11-29: 5 [IU] via SUBCUTANEOUS
  Filled 2017-11-29: qty 0.05

## 2017-11-29 NOTE — Progress Notes (Signed)
Occupational Therapy Session Note  Patient Details  Name: Daniel [name] MRN: [id number] Date of Birth: 06-30-1965  Today's Date: 11/29/2017 OT Individual Time: 7654-6503 OT Individual Time Calculation (min): 58 min    Short Term Goals: Week 1:  OT Short Term Goal 1 (Week 1): Pt will complete LB bathing sit to stand with supervision and AE PRN. OT Short Term Goal 2 (Week 1): Pt will complete LB dressing sit to stand with supervision and AE. OT Short Term Goal 3 (Week 1): Pt will complete toilet transfer with supervision using RW to elevated toilet or 3:1.   Skilled Therapeutic Interventions/Progress Updates:    Upon entering the room, pt supine in bed with wife present in the room. Pt with no c/o pain this session. Pt agreeable to shower but very modest. Pt ambulating with RW and steady assistance into bathroom onto TTB. Pt removing clothing and passing out through curtain. Pt needing assistance to wash buttocks and B LEs with wife assisting per pt request. Pt donning underwear with steady assistance for balance and ambulating to commode for toileting needs. Pt unable to void and returning to sit EOB to complete dressing tasks. OT provided education to caregiver and pt regarding importance of pt increasing independence by doing as much of task as possible. Pt required steady assistance with balance while pulling pants over B hips in standing. Pt returning to supine at end of session with bed alarm activated and call bell within reach.   Therapy Documentation Precautions:  Precautions Precautions: Fall Restrictions Weight Bearing Restrictions: No General:   Vital Signs: Therapy Vitals Temp: 98.3 F (36.8 C) Temp Source: Oral Pulse Rate: 73 Resp: 16 BP: 127/78 Patient Position (if appropriate): Lying Oxygen Therapy SpO2: 97 % O2 Device: Room Air Pain: Pain Assessment Pain Scale: 0-10 Pain Score: 0-No pain ADL: ADL Eating: Independent Where Assessed-Eating: Chair Grooming:  Supervision/safety Where Assessed-Grooming: Wheelchair Upper Body Bathing: Supervision/safety Where Assessed-Upper Body Bathing: Shower Lower Body Bathing: Moderate assistance Where Assessed-Lower Body Bathing: Shower Upper Body Dressing: Supervision/safety Where Assessed-Upper Body Dressing: Wheelchair Lower Body Dressing: Moderate assistance Where Assessed-Lower Body Dressing: Wheelchair Toileting: Moderate assistance Where Assessed-Toileting: Bedside Commode Toilet Transfer: Minimal assistance Toilet Transfer Method: Counselling psychologist: Engineer, technical sales Transfer: Minimal assistance Tub/Shower Transfer Method: Optometrist: Facilities manager: Environmental education officer Method: Heritage manager: Radio broadcast assistant   Therapy/Group: Individual Therapy  Gypsy Decant 11/29/2017, 4:21 PM

## 2017-11-29 NOTE — Progress Notes (Signed)
Physical Therapy Session Note  Patient Details  Name: Daniel [name] MRN: [id number] Date of Birth: 01-28-1966  Today's Date: 11/29/2017 PT Individual Time1100-1200   60 min   Short Term Goals: Week 1:  PT Short Term Goal 1 (Week 1): STG=LTG due ELOS  Skilled Therapeutic Interventions/Progress Updates:   Pt received sitting in WC and agreeable to PT. PT instructed pt in gait training with RW x 150f with supervision assist-CGA for safety. PT applied foot up brace and instructed pt in gait training x 1586fwith supervision assist fading to min assist due to poor safety and increased foot drag Bil due to LE weakness. PT instructed pt in modified Level A [place] strengthening exercises. Min assist from PT for improved safety as well provide multimodal cues cues technique to improve strengthening aspect of movements. [facility] returned to [facility] and [facility] and all needs met.         Therapy Documentation Precautions:  Precautions Precautions: Fall Restrictions Weight Bearing Restrictions: No Vital Signs: Therapy Vitals Temp: 98.4 F (36.9 C) Temp Source: Oral Pulse Rate: 77 Resp: 18 BP: 124/72 Patient Position (if appropriate): Lying Oxygen Therapy SpO2: 98 % O2 Device: Room Air Pain: denies   Therapy/Group: Individual Therapy  AuLorie Phenix0/10/2017, 8:04 AM

## 2017-11-29 NOTE — Progress Notes (Signed)
[place] PHYSICAL MEDICINE & REHABILITATION PROGRESS NOTE  Subjective/Complaints:  Patient seen laying in bed this morning.  He states he slept fairly overnight due to constipation, which resolved with medications.  Wife is not present today and patient appears much calmer.  ROS:  Denies CP, S OB, nausea, vomiting, diarrhea.  Objective: Vital Signs: Blood pressure 124/72, pulse 77, temperature 98.4 F (36.9 C), temperature source Oral, resp. rate 18, height 5\' 9"  (1.753 m), weight 134.3 kg, SpO2 98 %. No results found. Recent Labs    11/27/17 1107 11/29/17 0556  WBC 9.7 9.3  HGB 13.3 12.2*  HCT 43.2 39.8  PLT 275 234   Recent Labs    11/27/17 1107 11/29/17 0556  NA 135 139  K 4.0 4.2  CL 98 100  CO2 25 30  GLUCOSE 197* 163*  BUN 20 21*  CREATININE 1.43* 1.33*  CALCIUM 8.7* 8.6*    Physical Exam: BP 124/72 (BP Location: Left Arm)   Pulse 77   Temp 98.4 F (36.9 C) (Oral)   Resp 18   Ht 5\' 9"  (1.753 m)   Wt 134.3 kg   SpO2 98%   BMI 43.72 kg/m  Constitutional: He appearswell-developed.  Obese. HENT: Normocephalicand atraumatic.  Eyes:EOMare normal.  No discharge.  Cardiovascular:RRR.  No JVD. Respiratory:Effort normal and breath sounds normal.  QJ:FHLK.Bowel sounds are normal.  Musculoskeletal: He exhibits  edema in lower extremities, stable.  He exhibits notenderness.  Neurological: He isalert and oriented, except for date of month, again states that he does not keep track of these things Able to follow simple motor commands.  Motor:  LUE: 4+/5 proximal to distal, unchanged LLE: Hip flexion, knee extension 4/5, ankle dorsiflexion 3+/5, unchanged Skin: Skin iswarmand dry.  Psychiatric: His affect isblunt.  Agitated.   Assessment/Plan: 1. Functional deficits secondary to debility which require 3+ hours per day of interdisciplinary therapy in a comprehensive inpatient rehab setting.  Physiatrist is providing close team supervision and 24  hour management of active medical problems listed below.  Physiatrist and rehab team continue to assess barriers to discharge/monitor patient progress toward functional and medical goals  Care Tool:  Bathing  Bathing activity did not occur: Refused Body parts bathed by patient: Right arm, Left arm, Chest, Abdomen, Front perineal area, Right upper leg, Left upper leg, Face   Body parts bathed by helper: Front perineal area Body parts n/a: Buttocks   Bathing assist Assist Level: Minimal Assistance - Patient > 75%     Upper Body Dressing/Undressing Upper body dressing   What is the patient wearing?: Pull over shirt    Upper body assist Assist Level: Set up assist    Lower Body Dressing/Undressing Lower body dressing      What is the patient wearing?: Pants     Lower body assist Assist for lower body dressing: Minimal Assistance - Patient > 75%     Toileting Toileting Toileting Activity did not occur (Clothing management and hygiene only): N/A (no void or bm)  Toileting assist Assist for toileting: Minimal Assistance - Patient > 75%     Transfers Chair/bed transfer  Transfers assist     Chair/bed transfer assist level: Contact Guard/Touching assist     Locomotion Ambulation   Ambulation assist   Ambulation activity did not occur: Safety/medical concerns  Assist level: Contact Guard/Touching assist Assistive device: Walker-rolling Max distance: 1ft   Walk 10 feet activity   Assist  Walk 10 feet activity did not occur: Safety/medical concerns  Assist  level: Contact Guard/Touching assist Assistive device: Walker-rolling   Walk 50 feet activity   Assist Walk 50 feet with 2 turns activity did not occur: Safety/medical concerns  Assist level: Contact Guard/Touching assist Assistive device: Walker-rolling    Walk 150 feet activity   Assist Walk 150 feet activity did not occur: Safety/medical concerns         Walk 10 feet on uneven surface   activity   Assist Walk 10 feet on uneven surfaces activity did not occur: Safety/medical concerns         Wheelchair     Assist   Type of Wheelchair: Manual    Wheelchair assist level: Supervision/Verbal cueing Max wheelchair distance: 120    Wheelchair 50 feet with 2 turns activity    Assist        Assist Level: Supervision/Verbal cueing   Wheelchair 150 feet activity     Assist     Assist Level: Supervision/Verbal cueing      Medical Problem List and Plan: 1. Weakness, limitation self-care secondary to debility.             Continue CIR  Team conference today to discuss current and goals and coordination of care, home and environmental barriers, and discharge planning with nursing, case manager, and therapies.  2. DVT Prophylaxis/Anticoagulation: Pharmaceutical: Lovenox 3. Pain Management: tylenol prn.   Gabapentin 100 3 times daily started on 10/6  Improved  4. Mood: LCSW to follow for evaluation and support.  5. Neuropsych: This patient is not capable of making decisions on his own behalf. 6. Skin/Wound Care: Routine pressure relief measures.  7. Fluids/Electrolytes/Nutrition: Strict I/O. Monitor weights daily. Low salt diet.  8. T2DM: Educated patient on dietary restrictions. Monitor blood sugars AC at bedtime. Titrate medications for tighter control             Lantus 30 units at bedtime  Lantus 5 units daily added on 10/5, increased to 10 units on 10/9             NovoLog 3 units 3 times daily             SSI 9. HTN: Blood pressures twice daily.              Continue hydralazine 100 3 times daily             Relatively controlled on 10/9 10. Chronic renal failure: Will need work-up on hematuria and proteinuria after discharge.             Creatinine 1.33 on 10/9  Encourage fluids 11. New onset seizures: Continue Keppra, Vimpat, Dilantin. LFTs within normal limits. Will have pharmacy follow for assistance/adjustment of dose.  12.  Fluid overload: Likely due to illness and hypoalbuminemia. Continue to monitor daily weights. Changed glucerna to prostat to avoid hyperglycemia.               Filed Weights   11/26/17 0500 11/27/17 0500 11/28/17 0500  Weight: 133.9 kg 132.3 kg 134.3 kg   Stable on 10/9  Lasix 40 mg x1 ordered on 10/8 13 Obesity hypoventilation syndrome: Encourage CPAP use at bedtime 14. Morbid Obesity: Consulted dietician for dietary education. Discussed importance of weight loss and activity to help promote health and mobility. Will need reinforcement.  15.  Hypoalbuminemia             Supplement initiated 16.  Hyponatremia: Resolved             Sodium 139 on 10/9  Continue to monitor 17.  Acute blood loss anemia:   Hemoglobin 12.2 on 10/9             Continue to monitor 18.  Sleep disturbance  Melatonin started on 10/6   LOS: 7 days A FACE TO FACE EVALUATION WAS PERFORMED  Ankit Lorie Phenix 11/29/2017, 8:07 AM

## 2017-11-29 NOTE — Progress Notes (Signed)
Speech Language Pathology Daily Session Note  Patient Details  Name: Daniel [name] MRN: [id number] Date of Birth: 03-21-1965  Today's Date: 11/29/2017 SLP Individual Time: 7711-6579 SLP Individual Time Calculation (min): 43 min  Short Term Goals: Week 1: SLP Short Term Goal 1 (Week 1): STG=LTG due to ELOS   Skilled Therapeutic Interventions:Skilled ST services focused on cognitive skills. SLP educated in creating associations, repetition and visualization to aid in recall. SLP facilitated recall strategies utilizing "call it" task, pt required max-mod A cues. Pt demonstrated selective attention to topic, presented task, in 5 min intervals with Max A cues demonstrating tangential speech. Pt was left in room with call bell within reach and bed alarm set. SLP reccomends to continue skilled services.     Function:  Eating Eating                 Cognition Comprehension    Expression      Social Interaction    Problem Solving    Memory      Pain Pain Assessment Pain Scale: 0-10 Pain Score: 0-No pain  Therapy/Group: Individual Therapy  [name] 11/29/2017, 1:02 PM

## 2017-11-29 NOTE — Progress Notes (Signed)
Speech Language Pathology Daily Session Note  Patient Details  Name: Daniel [name] MRN: [id number] Date of Birth: Oct 13, 1965  Today's Date: 11/29/2017 SLP Individual Time: 1980-2217 SLP Individual Time Calculation (min): 43 min  Short Term Goals: Week 1: SLP Short Term Goal 1 (Week 1): STG=LTG due to ELOS   Skilled Therapeutic Interventions:  Pt was seen for skilled ST targeting cognitive goals.  Pt was in bed upon arrival but was agreeable to getting up to wheelchair to participate in therapy.  During functional conversations with therapist, pt reported feeling very pleased about a visit with a "doctor" who came and discussed diabetes management (no note yet in chart but I would suspect possible consult with diabetes coordinator).  Pt felt that talking with this particular person was very informative and helpful and pt was able to recall at least 2 recommendations for diet and lifestyle changes with mod I.  Pt also asked appropriate, insightful questions about realistic prognostic expectations for recovery which were not at all negative or self deprecating.  Pt did repeat himself once or twice throughout therapy session but otherwise he demonstrated appropriate recall for important information.  Pt was returned to room and left in wheelchair with wife at bedside.  Continue per current plan of care.      Pain Pain Assessment Pain Scale: 0-10 Pain Score: 0-No pain  Therapy/Group: Individual Therapy  Daniel [name], [name] 11/29/2017, 11:03 AM

## 2017-11-29 NOTE — Progress Notes (Signed)
RN sent A-11 to materials management at 2335 per [employer] tubing system data and paged materials management several times; soap suds enema kit has arrived on unit during 0100 hour; will attempt to administer

## 2017-11-29 NOTE — Consult Note (Signed)
Neuropsychological Consultation   Patient:   Daniel [name]   DOB:   1965-05-08  MR Number:  [id number]  Location:  [facility] [address] CENTER [address] [place] [id number] [place] [address] Dept: [place]: [phone]           Date of Service:   11/29/2017  Start Time:   8 AM End Time:   9 AM  Provider/Observer:  Ilean Skill, Psy.D.       Clinical Neuropsychologist       Billing Code/Service: [phone] 4 Units  Chief Complaint:    Daniel [name] is a 52 year old male with history of PE, OSA, obesity, 3-4 weeks progressive encephalopathy with hallucinations and falls.  Admitted to outside hospital for work-up.  Patient had malignant hypertension with periods of unresponsiveness due to seizures.  Brought to [facility] on 10/27/2017 for continuous EEG monitory.  Number of medical issues present during hospital course including pneumonia.  Patient has prior history of CPAP/BiPAP with dx of severe sleep apnea.    Patient has had issues of alterations in mental status.  Status epilepticus has resolved.  Patient has continued to have renal issues and diabetes.  Patient has also refused to use CPAP consistently.  Due to severe deconditioned state referred for CIR.    Reason for Service:  The patient was referred for neuropsychological consultation due to adjustment and coping issues.   Below is the HPI for the current admission.   HPI:  Daniel [name] is a 52 year old male with history of PE, OSA, morbid obesity, 3-4 weeks of progressive encephalopathy with hallucination and falls who was admitted to outside hospital for work-up.  History taken from chart review and wife.  He was found to have malignant hypertension with periods of unresponsiveness due to seizures and was started on Keppra and Vimpat.  He continued to have these episodes and was transferred to [facility] on 10/27/2017 for continuous EEG monitoring.  He was intubated  for airway protection due to status epilepticus and neurology following for assistance.  Hospital course significant for fevers due to Haemophilus pneumonia CAP, severe hypoglycemia, dysphagia, diarrhea as well as encephalopathy.  He tolerated extubation on zero 9/13 and has been refusing to use CPAP.  He developed recurrent fevers on 9/17 and was pancultured and started on ceftriaxone empirically.  ID consulted for input and recommended discontinuation of antibiotics due to concerns of drug fever v/s related to seizures.  Left upper extremity Dopplers done and were positive for superficial thrombosis in the cephalic vein-this has been treated with local measures.  He has defervesced and mentation is improving.  He had recurrent episode of lethargy with waxing and waning of mental status.  CT of head reviewed, unremarkable for acute intracranial process.  Neurology recommended increasing fosphenytoin dose to 150 mg 3 times daily.  Status epilepticus has resolved and neurology recommends continuing Keppra, Vimpat and Dilantin as well as control of hyperglycemia.    On 9/17, He has also had issues with acute on chronic renal failure with decrease in urine output and hypernatremia.  Dr. Marval Regal felt this was due to volume depletion as well as urinary retention.  Renal ultrasound showed echogenicity within normal limits. Lisinopril and demadex placed on hold and patient treated with IV fluids as well as has required I/O cath's due to urinary retention.  Hematuria and proteinuria felt to be due to diabetic nephropathy but recommends a work-up after  discharge.  Blood sugars continue to be difficulty to control with patient reporting baseline blood sugars around 400s.  He continues to refuse to use CPAP consistently.  Inappropriate behaviors and encephalopathy is resolving.  Therapies ongoing he was noted to be severely deconditioned.  CIR recommended due to functional deficits  Current Status:  Daniel [name] has  continued to have worry about his medical status including reporting fears about having his BiPAP taken as an excuse for not using.  Pt and wife report that he has been using CPAP but that is questionable.  Patient has some irrational fears around "Government" monitoring his use of CPAP etc.  Patient likely has some poorly informed ideas about his medical care and behavioral goals.    Behavioral Observation: Daniel [name]  presents as a 52 y.o.-year-old Right Caucasian Male who appeared his stated age. his dress was Appropriate and he was Well Groomed and his manners were Appropriate to the situation.  his participation was indicative of Appropriate and Redirectable behaviors.  There were any physical disabilities noted.  he displayed an appropriate level of cooperation and motivation.     Interactions:    Active Appropriate and Redirectable  Attention:   abnormal and Preoccupation with internal ideations.  Memory:   abnormal; remote memory intact, recent memory impaired  Visuo-spatial:  not examined  Speech (Volume):  normal  Speech:   normal; normal  Thought Process:  Coherent, Circumstantial and Tangential  Though Content:  WNL; not suicidal and not homicidal  Orientation:   person, place, time/date and situation  Judgment:   Poor  Planning:   Poor  Affect:    Anxious and Depressed  Mood:    Dysphoric  Insight:   Shallow  Intelligence:   normal  Medical History:   Past Medical History:  Diagnosis Date  . Arthritis    "elbows and wrists" (11/13/2017)  . CHF (congestive heart failure) ([place]) 08/2016  . CKD stage 3 due to type 2 diabetes mellitus ([place]) 06/2016  . Diabetes mellitus type 2 in obese ([place])   . High cholesterol   . Hypertension   . Hypothyroidism   . Migraines   . Morbid obesity ([place])   . On home oxygen therapy    "3L; now only at night" (11/13/2017)  . OSA on CPAP   . Pneumonia 2017   "double pneumonia"  . Pneumonia 10/2017   "while in ICU" (11/13/2017)   . Pulmonary embolism ([place])   . Seizure ([place])    "10/24/2017-10/28/2017; couldn't get them stopped til medically induced coma. after they brought him out he started having more; can't find RX to stop them" (11/13/2017)  . Stroke [facility])    "scans showed several mini strokes in the past; nothing recent" (11/13/2017)   Family Med/Psych History:  Family History  Problem Relation Age of Onset  . Glaucoma Mother   . Diabetes Maternal Grandmother   . Diabetes Maternal Grandfather   . Alpha-1 antitrypsin deficiency Neg Hx   . Asthma Neg Hx   . Breast cancer Neg Hx   . Stroke Neg Hx   . Colon cancer Neg Hx   . Coronary artery disease Neg Hx   . COPD Neg Hx   . Cystic fibrosis Neg Hx   . Deep vein thrombosis Neg Hx   . Hypertension Neg Hx   . Hyperlipidemia Neg Hx   . Kidney disease Neg Hx   . Liver disease Neg Hx   . Lung cancer Neg Hx   .  Lupus Neg Hx   . Neurofibromatosis Neg Hx   . Osteoporosis Neg Hx   . Ovarian cancer Neg Hx   . Positive PPD/TB Exposure Neg Hx   . Prostate cancer Neg Hx   . Pulmonary embolism Neg Hx   . Pulmonary fibrosis Neg Hx   . Rheum arthritis Neg Hx   . Sarcoidosis Neg Hx   . Sleep apnea Neg Hx   . Thyroid disease Neg Hx   . Tuberculosis Neg Hx   . Tuberous sclerosis Neg Hx     Risk of Suicide/Violence: low Patient denies SI or HI  Impression/DX:  Daniel [name] is a 52 year old male with history of PE, OSA, obesity, 3-4 weeks progressive encephalopathy with hallucinations and falls.  Admitted to outside hospital for work-up.  Patient had malignant hypertension with periods of unresponsiveness due to seizures.  Brought to [facility] on 10/27/2017 for continuous EEG monitory.  Number of medical issues present during hospital course including pneumonia.  Patient has prior history of CPAP/BiPAP with dx of severe sleep apnea.    Patient has had issues of alterations in mental status.  Status epilepticus has resolved.  Patient has continued to have renal issues and diabetes.   Patient has also refused to use CPAP consistently.  Due to severe deconditioned state referred for CIR.    Daniel [name] has continued to have worry about his medical status including reporting fears about having his BiPAP taken as an excuse for not using.  Pt and wife report that he has been using CPAP but that is questionable.  Patient has some irrational fears around "Government" monitoring his use of CPAP etc.  Patient likely has some poorly informed ideas about his medical care and behavioral goals.     Disposition/Plan:  Patient has some some well ingrained ideas that are likely playing a roll with increased anxiety and resistance to some changes.  Will see the patient again to continue to work on coping and adjustment issues.        Electronically Signed   _______________________ Ilean Skill, Psy.D.

## 2017-11-30 ENCOUNTER — Inpatient Hospital Stay (HOSPITAL_COMMUNITY): Payer: Medicaid Other | Admitting: Occupational Therapy

## 2017-11-30 ENCOUNTER — Inpatient Hospital Stay (HOSPITAL_COMMUNITY): Payer: Medicaid Other | Admitting: Speech Pathology

## 2017-11-30 ENCOUNTER — Inpatient Hospital Stay (HOSPITAL_COMMUNITY): Payer: Medicaid Other | Admitting: Physical Therapy

## 2017-11-30 LAB — PHENYTOIN LEVEL, TOTAL: PHENYTOIN LVL: 7 ug/mL — AB (ref 10.0–20.0)

## 2017-11-30 LAB — GLUCOSE, CAPILLARY
GLUCOSE-CAPILLARY: 116 mg/dL — AB (ref 70–99)
Glucose-Capillary: 157 mg/dL — ABNORMAL HIGH (ref 70–99)
Glucose-Capillary: 192 mg/dL — ABNORMAL HIGH (ref 70–99)
Glucose-Capillary: 205 mg/dL — ABNORMAL HIGH (ref 70–99)

## 2017-11-30 LAB — ALBUMIN: ALBUMIN: 2.9 g/dL — AB (ref 3.5–5.0)

## 2017-11-30 MED ORDER — FUROSEMIDE 20 MG PO TABS
20.0000 mg | ORAL_TABLET | Freq: Every day | ORAL | Status: DC
  Administered 2017-11-30 – 2017-12-02 (×3): 20 mg via ORAL
  Filled 2017-11-30 (×3): qty 1

## 2017-11-30 MED ORDER — LIVING WELL WITH DIABETES BOOK
Freq: Once | Status: AC
  Administered 2017-11-30: 18:00:00
  Filled 2017-11-30: qty 1

## 2017-11-30 NOTE — Progress Notes (Signed)
MEDICATION RELATED CONSULT NOTE - Follow-up  Pharmacy Consult for Phenytoin Indication: Status epilepticus / seizures  Allergies  Allergen Reactions  . Montelukast Other (See Comments)    Possibly cause headaches per spouse   Patient Measurements: Height: 5\' 9"  (175.3 cm) Weight: 296 lb 1.2 oz (134.3 kg) IBW/kg (Calculated) : 70.7  Vital Signs: Temp: 98 F (36.7 C) (10/10 0500) Temp Source: Oral (10/10 0500) BP: 126/68 (10/10 0500) Pulse Rate: 88 (10/10 0500)  Labs: Recent Labs    11/27/17 1107 11/29/17 0556 11/30/17 0708  WBC 9.7 9.3  --   HGB 13.3 12.2*  --   HCT 43.2 39.8  --   PLT 275 234  --   CREATININE 1.43* 1.33*  --   ALBUMIN  --   --  2.9*   Estimated Creatinine Clearance: 87.3 mL/min (A) (by C-G formula based on SCr of 1.33 mg/dL (H)).  Assessment: 52 yo M presented with status epilepticus. Started on several anti-seizure medications. Seizure medications switched to po 9/25 to prepare for discharge. Pharmacy consulted to assist with phenytoin dosing.   A corrected phenytoin level this morning is therapeutic (measured 7, alb 2.9, corrected level 10.3, goal range 10-20 mcg/ml). Will continue current dosing. The patient remains stable without seizure activity.    Goal of Therapy:  Total Corrected Phenytoin level 10-20 mcg/ml Prevention of seizures  Plan:  Continue phenytoin 400mg  PO qAM and 300mg  PO qPM Monitor clinical picture Will consider rechecking another level in 1-2 weeks (if still here) to confirm still therapeutic  Thank you for allowing pharmacy to be a part of this patient's care.  Alycia Rossetti, PharmD, BCPS Clinical Pharmacist Please check AMION for all [place] numbers 11/30/2017 8:40 AM

## 2017-11-30 NOTE — Progress Notes (Signed)
[facility] PHYSICAL MEDICINE & REHABILITATION PROGRESS NOTE  Subjective/Complaints:  Patient seen laying in bed this morning.  He states he slept well overnight.  He is more pleasant this morning.  He is wearing his CPAP.  He is appreciative of his care.  ROS:  Denies CP, S OB, nausea, vomiting, diarrhea.  Objective: Vital Signs: Blood pressure 126/68, pulse 88, temperature 98 F (36.7 C), temperature source Oral, resp. rate 17, height 5\' 9"  (1.753 m), weight 134.3 kg, SpO2 95 %. No results found. Recent Labs    11/27/17 1107 11/29/17 0556  WBC 9.7 9.3  HGB 13.3 12.2*  HCT 43.2 39.8  PLT 275 234   Recent Labs    11/27/17 1107 11/29/17 0556  NA 135 139  K 4.0 4.2  CL 98 100  CO2 25 30  GLUCOSE 197* 163*  BUN 20 21*  CREATININE 1.43* 1.33*  CALCIUM 8.7* 8.6*    Physical Exam: BP 126/68 (BP Location: Left Arm)   Pulse 88   Temp 98 F (36.7 C) (Oral)   Resp 17   Ht 5\' 9"  (1.753 m)   Wt 134.3 kg   SpO2 95%   BMI 43.72 kg/m  Constitutional: He appearswell-developed.  Obese. HENT: Normocephalicand atraumatic.  Eyes:EOMare normal.  No discharge.  Cardiovascular:RRR.  No JVD. Respiratory:Effort normal and breath sounds normal.  UJ:WJXB.Bowel sounds are normal.  Musculoskeletal: He exhibits  edema in lower extremities, unchanged.  He exhibits notenderness.  Neurological: He isalert and oriented x3, however it appears wife prepped patient Able to follow simple motor commands.  Motor:  LUE: 4+/5 proximal to distal, unchanged LLE: Hip flexion, knee extension 4/5, ankle dorsiflexion 3+/5, stable Skin: Skin iswarmand dry.  Psychiatric: Normal affect.  Normal behavior.    Assessment/Plan: 1. Functional deficits secondary to debility which require 3+ hours per day of interdisciplinary therapy in a comprehensive inpatient rehab setting.  Physiatrist is providing close team supervision and 24 hour management of active medical problems listed  below.  Physiatrist and rehab team continue to assess barriers to discharge/monitor patient progress toward functional and medical goals  Care Tool:  Bathing  Bathing activity did not occur: Refused Body parts bathed by patient: Right arm, Left arm, Chest, Abdomen, Front perineal area, Right upper leg, Left upper leg, Face   Body parts bathed by helper: Buttocks, Left lower leg, Right lower leg Body parts n/a: Buttocks   Bathing assist Assist Level: Minimal Assistance - Patient > 75%     Upper Body Dressing/Undressing Upper body dressing   What is the patient wearing?: Pull over shirt    Upper body assist Assist Level: Set up assist    Lower Body Dressing/Undressing Lower body dressing      What is the patient wearing?: Pants     Lower body assist Assist for lower body dressing: Minimal Assistance - Patient > 75%     Toileting Toileting Toileting Activity did not occur (Clothing management and hygiene only): N/A (no void or bm)  Toileting assist Assist for toileting: Minimal Assistance - Patient > 75%     Transfers Chair/bed transfer  Transfers assist     Chair/bed transfer assist level: Contact Guard/Touching assist     Locomotion Ambulation   Ambulation assist   Ambulation activity did not occur: Safety/medical concerns  Assist level: Contact Guard/Touching assist Assistive device: Walker-rolling Max distance: 92ft   Walk 10 feet activity   Assist  Walk 10 feet activity did not occur: Safety/medical concerns  Assist level: Contact  Guard/Touching assist Assistive device: Walker-rolling   Walk 50 feet activity   Assist Walk 50 feet with 2 turns activity did not occur: Safety/medical concerns  Assist level: Contact Guard/Touching assist Assistive device: Walker-rolling    Walk 150 feet activity   Assist Walk 150 feet activity did not occur: Safety/medical concerns         Walk 10 feet on uneven surface  activity   Assist Walk 10  feet on uneven surfaces activity did not occur: Safety/medical concerns         Wheelchair     Assist   Type of Wheelchair: Manual    Wheelchair assist level: Supervision/Verbal cueing Max wheelchair distance: 120    Wheelchair 50 feet with 2 turns activity    Assist        Assist Level: Supervision/Verbal cueing   Wheelchair 150 feet activity     Assist     Assist Level: Supervision/Verbal cueing      Medical Problem List and Plan: 1. Weakness, limitation self-care secondary to debility.             Continue CIR 2. DVT Prophylaxis/Anticoagulation: Pharmaceutical: Lovenox 3. Pain Management: tylenol prn.   Gabapentin 100 3 times daily started on 10/6  Improved  4. Mood: LCSW to follow for evaluation and support.  5. Neuropsych: This patient is not capable of making decisions on his own behalf. 6. Skin/Wound Care: Routine pressure relief measures.  7. Fluids/Electrolytes/Nutrition: Strict I/O. Monitor weights daily. Low salt diet.  8. T2DM: Educated patient on dietary restrictions. Monitor blood sugars AC at bedtime. Titrate medications for tighter control             Lantus 30 units at bedtime  Lantus 5 units daily added on 10/5, increased to 10 units on 10/9             NovoLog 3 units 3 times daily             SSI  Improving on 10/10 9. HTN: Blood pressures twice daily.              Continue hydralazine 100 3 times daily             1 spike, otherwise relatively controlled on 10/10 10. Chronic renal failure: Will need work-up on hematuria and proteinuria after discharge.             Creatinine 1.33 on 10/9  Encourage fluids 11. New onset seizures: Continue Keppra, Vimpat, Dilantin. LFTs within normal limits. Will have pharmacy follow for assistance/adjustment of dose.   Phenytoin level low on 10/10 12. Fluid overload: Likely due to illness and hypoalbuminemia. Continue to monitor daily weights. Changed glucerna to prostat to avoid  hyperglycemia.               Filed Weights   11/26/17 0500 11/27/17 0500 11/28/17 0500  Weight: 133.9 kg 132.3 kg 134.3 kg   Stable on 10/9  Lasix 40 mg x1 ordered on 10/8  Lasix 20 daily started on 10/10 13 Obesity hypoventilation syndrome: Encourage CPAP use at bedtime 14. Morbid Obesity: Consulted dietician for dietary education. Discussed importance of weight loss and activity to help promote health and mobility. Will need reinforcement.  15.  Hypoalbuminemia             Supplement initiated 16.  Hyponatremia: Resolved             Sodium 139 on 10/9  Continue to monitor 17.  Acute blood loss anemia:   Hemoglobin 12.2 on 10/9             Continue to monitor 18.  Sleep disturbance  Melatonin started on 10/6  Improving   LOS: 8 days A FACE TO FACE EVALUATION WAS PERFORMED  Kateryn Marasigan Lorie Phenix 11/30/2017, 8:33 AM

## 2017-11-30 NOTE — Progress Notes (Signed)
Social Work Patient ID: Daniel [name], male   DOB: 1965/04/05, 52 y.o.   MRN: [id number]  Met with pt and wife today to review team conf - both aware and agreeable with targeted d/c date of 10/12 and supervision goals.  Pt states that over the past couple of days, his confidence level as gone up and he is feeling ready for d/c Saturday.  Pt continues to be very talkative but pleasant.  Discussed DME and f/u referrals I am making.  Requests written info about diet he should follow upon d/c - RN aware. Continue to follow.  Alison Kubicki, LCSW

## 2017-11-30 NOTE — Progress Notes (Signed)
Recreational Therapy Assessment and Plan  Patient Details  Name: Daniel [name] MRN: [id number] Date of Birth: 10-23-1965 Today's Date: 11/30/2017  Rehab Potential: Good ELOS: discharge 10/12  Assessment  Problem List:      Patient Active Problem List   Diagnosis Date Noted  . Debility 11/22/2017  . Morbid obesity ([place])   . Hypervolemia   . Seizures ([place])   . OSA (obstructive sleep apnea)   . Noncompliance   . FUO (fever of unknown origin)   . AKI (acute kidney injury) ([place])   . Diabetes mellitus type 2 in nonobese ([facility])   . Diabetes mellitus type 2 in obese ([place])   . Acute blood loss anemia   . Labile blood pressure   . Fever   . Status epilepticus ([place])   . Recurrent seizures ([place]) 10/27/2017  . Altered sensorium 10/27/2017  . Dyspnea on exertion 08/14/2017  . Chronic respiratory failure with hypoxia ([place]) 08/14/2017  . Sleep apnea 08/14/2017  . Obesity hypoventilation syndrome ([place]) 08/14/2017  . Fatigue 08/14/2017  . Hypertension 08/14/2017  . Hyperlipidemia 12/23/2016  . Hypothyroid 12/23/2016  . Acute combined systolic and diastolic congestive heart failure ([place]) 06/24/2016  . CKD (chronic kidney disease), stage III ([place]) 06/24/2016  . Type 2 diabetes mellitus without complications ([place]) 19/75/8832    Past Medical History:      Past Medical History:  Diagnosis Date  . Arthritis    "elbows and wrists" (11/13/2017)  . CHF (congestive heart failure) ([place]) 08/2016  . CKD stage 3 due to type 2 diabetes mellitus ([place]) 06/2016  . Diabetes mellitus type 2 in obese ([place])   . High cholesterol   . Hypertension   . Hypothyroidism   . Migraines   . Morbid obesity ([place])   . On home oxygen therapy    "3L; now only at night" (11/13/2017)  . OSA on CPAP   . Pneumonia 2017   "double pneumonia"  . Pneumonia 10/2017   "while in ICU" (11/13/2017)  . Pulmonary embolism ([place])   . Seizure ([place])    "10/24/2017-10/28/2017; couldn't get them  stopped til medically induced coma. after they brought him out he started having more; can't find RX to stop them" (11/13/2017)  . Stroke [facility])    "scans showed several mini strokes in the past; nothing recent" (11/13/2017)   Past Surgical History:       Past Surgical History:  Procedure Laterality Date  . CARDIAC CATHETERIZATION  12/2016    Assessment & Plan Clinical Impression: Patient is a 52 year old male with history of PE, OSA, morbid obesity, 3-4 weeks of progressive encephalopathy with hallucination and falls who was admitted to outside hospital for work-up. History taken from chart review and wife. He was found to have malignant hypertension with periods of unresponsiveness due to seizures and was started on Keppra and Vimpat. He continued to have these episodes and was transferred to [facility] on 10/27/2017 for continuous EEG monitoring. He was intubated for airway protection due to status epilepticus and neurology following for assistance. Hospital course significant for fevers due to Haemophilus pneumonia CAP, severe hypoglycemia, dysphagia, diarrhea as well as encephalopathy. He tolerated extubation on zero 9/13 and has been refusing to use CPAP. He developed recurrent fevers on 9/17 and was pancultured and started on ceftriaxone empirically. ID consulted for input and recommended discontinuation of antibiotics due to concerns of drug fever v/s related to seizures. Left upper extremity Dopplers done and were positive for superficial thrombosis  in the cephalic vein-this has been treated with local measures. He has defervesced and mentation is improving. He had recurrent episode of lethargy with waxing and waning of mental status. CT of head reviewed, unremarkable for acute intracranial process. Neurology recommended increasing fosphenytoin dose to 150 mg 3 times daily. Status epilepticus has resolved and neurology recommends continuing Keppra, Vimpat and Dilantin  as well as control of hyperglycemia.   On 9/17, He has also had issues with acute on chronic renal failure with decrease in urine output and hypernatremia. Dr. Marval Regal felt this was due to volume depletion as well as urinary retention. Renal ultrasound showed echogenicity within normal limits. Lisinopril and demadex placed on hold and patient treated with IV fluids as well as has required I/O cath's due to urinary retention. Hematuria and proteinuria felt to be due to diabetic nephropathy but recommends a work-up after discharge. Blood sugars continue to be difficulty to control with patient reporting baseline blood sugars around 400s. He continues to refuse to use CPAP consistently. Inappropriate behaviors and encephalopathy is resolving.   Patient transferred to CIR on 11/22/2017 .   Pt presents with decreased activity tolerance, decreased functional mobility, decreased balance,  decreased attention, decreased awareness, decreased problem solving, decreased safety awareness, decreased memory and delayed processing Limiting pt's independence with leisure/community pursuits.  Met with pt and wife to discuss leisure interest.  Pt spent most of the session talking about his past with specific emphasis on heavy lifting, stating he used to be one of the strongest men on the [place], competing on a regular basis.  Pt descried himself as someone who was well respected for his size and strength everywhere he went while he was still competing and lifting but over the last few years and especially during this hospitalization had difficulty coping with his lifestyle changes.  Pt also stated fears of hurting his grandchildren by falling on them due to his dibility and that they couldn't be around him because of this.  Educated/discussed with pt and wife on ways to incorporate grandchildren into therapies and activities at home to emotional support for both the pt and the grandchildren.  Both stated  understanding.   Plan  no further TR as pt is discharging home 10/12  Recommendations for other services: None   Discharge Criteria: Patient will be discharged from TR if patient refuses treatment 3 consecutive times without medical reason.  If treatment goals not met, if there is a change in medical status, if patient makes no progress towards goals or if patient is discharged from hospital.  The above assessment, treatment plan, treatment alternatives and goals were discussed and mutually agreed upon: by patient  [place] 11/30/2017, 8:55 AM

## 2017-11-30 NOTE — Progress Notes (Signed)
Occupational Therapy Session Note  Patient Details  Name: Daniel [name] MRN: [id number] Date of Birth: 04-29-1965  Today's Date: 11/30/2017 OT Individual Time: 0805-0900 OT Individual Time Calculation (min): 55 min    Short Term Goals: Week 1:  OT Short Term Goal 1 (Week 1): Pt will complete LB bathing sit to stand with supervision and AE PRN. OT Short Term Goal 2 (Week 1): Pt will complete LB dressing sit to stand with supervision and AE. OT Short Term Goal 3 (Week 1): Pt will complete toilet transfer with supervision using RW to elevated toilet or 3:1.   Skilled Therapeutic Interventions/Progress Updates:    Pt completed functional mobility with use of the RW for support and min guard assist.  He needed min instructional cueing to maintain upright standing and not lean too far forward with the RW.  He needed a rest break after approximately 100'.  Complete mobility once again before transferring to the wheelchair.  Took pt to the dayroom where he completed 2 sets of 6 mins on level 5 resistance with the Nustep.  Average number of steps 55-60 per minute.  For the second set therapist applied safety belt around his legs to help prevent abduction of the RLE, as he will hitting it on the handle with each repetition.  Finished session with return to the room and call button and phone in reach and pt eating breakfast.    Therapy Documentation Precautions:  Precautions Precautions: Fall Restrictions Weight Bearing Restrictions: No   Pain: Pain Assessment Pain Score: 0-No pain  Therapy/Group: Individual Therapy  Shanicqua Coldren OTR/L 11/30/2017, 9:02 AM

## 2017-11-30 NOTE — Patient Care Conference (Signed)
Inpatient RehabilitationTeam Conference and Plan of Care Update Date: 11/29/2017   Time: 2:40 PM    Patient Name: Daniel [name]      Medical Record Number: [id number]  Date of Birth: October 02, 1965 Sex: Male         Room/Bed: 4M04C/4M04C-01 Payor Info: Payor: MEDICAID PENDING / Plan: MEDICAID PENDING / Product Type: *No Product type* /    Admitting Diagnosis: debility  Admit Date/Time:  11/22/2017  3:36 PM Admission Comments: No comment available   Primary Diagnosis:  <principal problem not specified> Principal Problem: <principal problem not specified>  Patient Active Problem List   Diagnosis Date Noted  . Labile blood glucose   . Sleep disturbance   . Peripheral edema   . Hyponatremia   . Stage 2 chronic kidney disease   . Debility 11/22/2017  . Morbid obesity ([place])   . Hypervolemia   . Seizures ([place])   . OSA (obstructive sleep apnea)   . Noncompliance   . FUO (fever of unknown origin)   . AKI (acute kidney injury) ([place])   . Diabetes mellitus type 2 in nonobese ([facility])   . Diabetes mellitus type 2 in obese ([place])   . Acute blood loss anemia   . Labile blood pressure   . Fever   . Status epilepticus ([place])   . Recurrent seizures ([place]) 10/27/2017  . Altered sensorium 10/27/2017  . Dyspnea on exertion 08/14/2017  . Chronic respiratory failure with hypoxia ([place]) 08/14/2017  . Sleep apnea 08/14/2017  . Obesity hypoventilation syndrome ([place]) 08/14/2017  . Fatigue 08/14/2017  . Hypertension 08/14/2017  . Hyperlipidemia 12/23/2016  . Hypothyroid 12/23/2016  . 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 CKD (chronic kidney disease), stage III ([place]) 06/24/2016  . Type 2 diabetes mellitus without complications ([place]) 67/59/1638    Expected Discharge Date: Expected Discharge Date: 12/02/17  Team Members Present: Physician leading conference: Dr. Delice Lesch Social Worker Present: Lennart Pall, LCSW Nurse Present: Rayetta Humphrey, RN PT Present:  Barrie Folk, PT;Rosita Dechalus, PTA OT Present: Willeen Cass, OT SLP Present: Windell Moulding, SLP PPS Coordinator present : Daiva Nakayama, RN, CRRN     Current Status/Progress Goal Weekly Team Focus  Medical   Weakness, limitation self-care secondary to debility.  Improve mobility, pain, DM/HTN, edema  See above   Bowel/Bladder   cont b/b; lbm 10/9 with miralax, fleet enema, and soap suds enema  maintain continence  encourage use of bowel meds; hourly rounding   Swallow/Nutrition/ Hydration             ADL's   Supervision for UB bathing and dressing, min assist for LB bathing and dressing.  Min assist for stand pivot transfers with the RW.  Limited endurance and still with decreased balance,  Depressed mood with decreased working memory and decreased safety awareness.    Supervision overall  selfcare retraining, safety, cognitive treatment, neuromuscular re-education, balance retraining, DME education   Mobility   Supervision assist overall for all mobility with intermittent min assist with gait due LLE weakness and poor attention to task   SUpervision assist ambualtion with LRAD and Modifieed independent bed mobility and transfers to and from bed.   improved attention, endurance, LE strengthening, improved safety awareness with gait and transfers.    Communication             Safety/Cognition/ Behavioral Observations  min assist-supervision   supervision   continue to work on mildly complex problem solving, attention, and recall.  Pain   c/o pain to rectum during BM; tylenol;   <3  assess q shift and prn   Skin   mild masd to groin  free from infection breakdown with min assist  assess q shift and prn    Rehab Goals Patient on target to meet rehab goals: Yes *See Care Plan and progress notes for long and short-term goals.     Barriers to Discharge  Current Status/Progress Possible Resolutions Date Resolved   Physician    Medical stability     See above  Therapies, optimize  DM/HTN, follow labs      Nursing                  PT                    OT                  SLP                SW                Discharge Planning/Teaching Needs:  pt to d/c home with wife who can provide 24/7 assistance.  Wife stays with pt and regularly attends therapy sessions.  Education ongoing.   Team Discussion:  Minor med changes needed.  +BM and feeling much better!  CG-S overall with amb but still with right knee instability and left ankle weakness.  S - min assist with ADLs.  Supervision goals overall. Better with ST today. Wife stays with pt and education ongoing.  Revisions to Treatment Plan:  NA    Continued Need for Acute Rehabilitation Level of Care: The patient requires daily medical management by a physician with specialized training in physical medicine and rehabilitation for the following conditions: Daily direction of a multidisciplinary physical rehabilitation program to ensure safe treatment while eliciting the highest outcome that is of practical value to the patient.: Yes Daily medical management of patient stability for increased activity during participation in an intensive rehabilitation regime.: Yes Daily analysis of laboratory values and/or radiology reports with any subsequent need for medication adjustment of medical intervention for : Diabetes problems;Blood pressure problems;Renal problems;Other   I attest that I was present, lead the team conference, and concur with the assessment and plan of the team.   Nathanael Krist 12/01/2017, 9:26 AM

## 2017-11-30 NOTE — Progress Notes (Signed)
Physical Therapy Session Note  Patient Details  Name: Daniel [name] MRN: [id number] Date of Birth: [birth year]  Today's Date: 11/30/2017 PT Individual Time: 0900-1000 PT Individual Time Calculation (min): 60 min   Short Term Goals: Week 1:  PT Short Term Goal 1 (Week 1): STG=LTG due ELOS  Skilled Therapeutic Interventions/Progress Updates:   Pt received sitting in WC and agreeable to PT  WC mobility x 163f with supervision assist. Pt required min cues for attention to task and continued use of BUE to maintain forward progression with use of momentum to reduce overall energy expenditure.   Gait training with RW x 1537fwith Supervision assist from PT. Dynamic gait training to weave through 5 cones x 2 to simulate home environment. Supervision assist from PT   Stair management training instruct by PT with CGA and BUE support on R 4 steps x 2. Min cues for step to gait pattern to improve safety.   LE strengthening to perform reciprcal foot taps on 6 in step x 5. Lateral foot taps on 6 in step x 5 BLE. Min cues from PT for safety as well as cues to improve terminal extension in the stance LE.   Throughout treatment, pt completed sit<>stnad x 10 with supervision assist and min cues for proper UE placement to improve safety and prevent pulling on RW or stair rails.   [facility] returned to room and [facility] reach and all needs met.           Therapy Documentation Precautions:  Precautions Precautions: Fall Restrictions Weight Bearing Restrictions: No Vital Signs: Oxygen Therapy O2 Device: Room Air Pain: Pain Assessment Pain Score: 0-No pain    Therapy/Group: Individual Therapy  AuLorie Phenix0/11/2017, 10:21 AM

## 2017-11-30 NOTE — Progress Notes (Deleted)
Co-sign Janine Ores , Clin Instr.

## 2017-11-30 NOTE — Progress Notes (Signed)
Occupational Therapy Session Note  Patient Details  Name: Daniel [name] MRN: [id number] Date of Birth: 11-16-1965  Today's Date: 11/30/2017 OT Individual Time: 2257-5051 OT Individual Time Calculation (min): 32 min    Short Term Goals: Week 1:  OT Short Term Goal 1 (Week 1): Pt will complete LB bathing sit to stand with supervision and AE PRN. OT Short Term Goal 2 (Week 1): Pt will complete LB dressing sit to stand with supervision and AE. OT Short Term Goal 3 (Week 1): Pt will complete toilet transfer with supervision using RW to elevated toilet or 3:1.   Skilled Therapeutic Interventions/Progress Updates:    Pt completed functional mobility to the therapy gym and back with min guard assist during session and use of the RW for support.  He required min instructional cueing for hand placement with stand to sit as he did not reach back to the surface when sitting.  Once in the gym, had pt work on cognitive task of completing simple PVC pipe puzzle while sitting.  He was able to maintain selective attention with min instructional cueing to complete puzzle in a timely manner with only one error.  When questioned about, it he was able to recognize the error and correct it with min assist.  Finished session with return to the room and pt left sitting on the EOB with spouse supervising.  Signed spouse off to assist pt to the bed and to the bathroom with use of the RW for safety.    Therapy Documentation Precautions:  Precautions Precautions: Fall Restrictions Weight Bearing Restrictions: No  Pain: Pain Assessment Pain Scale: 0-10 Pain Score: 0-No pain  Therapy/Group: Individual Therapy  Dejanique Ruehl OTR/L 11/30/2017, 4:30 PM

## 2017-11-30 NOTE — Progress Notes (Signed)
Speech Language Pathology Daily Session Note  Patient Details  Name: Daniel [name] MRN: [id number] Date of Birth: November 16, 1965  Today's Date: 11/30/2017 SLP Individual Time: 1300-1345 SLP Individual Time Calculation (min): 45 min  Short Term Goals: Week 1: SLP Short Term Goal 1 (Week 1): STG=LTG due to ELOS   Skilled Therapeutic Interventions:  Pt was seen for skilled ST targeting cognitive goals.  SLP facilitated the session with a previously taught card game targeting use of memory compensatory strategies: specifically word-picture associations.  Pt was able to generate and then recall associations with min assist-supervision cues.  Pt was able to name and then recall specific category members during generative naming portion of task for 90% accuracy with supervision cues.  Pt was returned to room and left in wheelchair with wife at bedside and call bell within reach.  Continue per current plan of care.      Pain Pain Assessment Pain Scale: 0-10 Pain Score: 0-No pain  Therapy/Group: Individual Therapy  Joanne Salah, Selinda Orion 11/30/2017, 2:21 PM

## 2017-12-01 ENCOUNTER — Inpatient Hospital Stay (HOSPITAL_COMMUNITY): Payer: Medicaid Other | Admitting: Physical Therapy

## 2017-12-01 ENCOUNTER — Inpatient Hospital Stay (HOSPITAL_COMMUNITY): Payer: Medicaid Other | Admitting: Speech Pathology

## 2017-12-01 ENCOUNTER — Inpatient Hospital Stay (HOSPITAL_COMMUNITY): Payer: Medicaid Other | Admitting: Occupational Therapy

## 2017-12-01 LAB — GLUCOSE, CAPILLARY
GLUCOSE-CAPILLARY: 122 mg/dL — AB (ref 70–99)
GLUCOSE-CAPILLARY: 251 mg/dL — AB (ref 70–99)
Glucose-Capillary: 176 mg/dL — ABNORMAL HIGH (ref 70–99)
Glucose-Capillary: 115 mg/dL — ABNORMAL HIGH (ref 70–99)

## 2017-12-01 MED ORDER — GABAPENTIN 100 MG PO CAPS: 100.0000 mg | ORAL_CAPSULE | Freq: Three times a day (TID) | ORAL | 0 refills | Status: AC

## 2017-12-01 MED ORDER — LEVOTHYROXINE SODIUM 50 MCG PO TABS: 50.0000 ug | ORAL_TABLET | Freq: Every day | ORAL | 0 refills | Status: AC

## 2017-12-01 MED ORDER — LACOSAMIDE 200 MG PO TABS: 200.0000 mg | ORAL_TABLET | Freq: Two times a day (BID) | ORAL | 0 refills | Status: DC

## 2017-12-01 MED ORDER — PHENYTOIN SODIUM EXTENDED 300 MG PO CAPS: 300.0000 mg | ORAL_CAPSULE | Freq: Every day | ORAL | 0 refills | Status: DC

## 2017-12-01 MED ORDER — LEVETIRACETAM 1000 MG PO TABS: 1000.0000 mg | ORAL_TABLET | Freq: Two times a day (BID) | ORAL | 0 refills | Status: DC

## 2017-12-01 MED ORDER — INSULIN ASPART 100 UNIT/ML FLEXPEN: 5.0000 [IU] | PEN_INJECTOR | Freq: Three times a day (TID) | SUBCUTANEOUS | 0 refills | Status: DC

## 2017-12-01 MED ORDER — POLYETHYLENE GLYCOL 3350 17 G PO PACK: 17.0000 g | PACK | Freq: Two times a day (BID) | ORAL | 0 refills | Status: DC

## 2017-12-01 MED ORDER — FUROSEMIDE 20 MG PO TABS: 20.0000 mg | ORAL_TABLET | Freq: Every day | ORAL | 0 refills | Status: DC

## 2017-12-01 MED ORDER — METOPROLOL SUCCINATE ER 50 MG PO TB24: 50.0000 mg | ORAL_TABLET | Freq: Every day | ORAL | 0 refills | Status: DC

## 2017-12-01 MED ORDER — INSULIN PEN NEEDLE 32G X 6 MM MISC: 1.0000 "application " | Freq: Two times a day (BID) | 0 refills | Status: AC

## 2017-12-01 MED ORDER — INSULIN GLARGINE 100 UNIT/ML SOLOSTAR PEN: PEN_INJECTOR | SUBCUTANEOUS | 0 refills | Status: DC

## 2017-12-01 MED ORDER — INSULIN GLARGINE 100 UNIT/ML ~~LOC~~ SOLN: 30.0000 [IU] | Freq: Every day | SUBCUTANEOUS | 11 refills | Status: DC

## 2017-12-01 MED ORDER — HYDRALAZINE HCL 100 MG PO TABS: 100.0000 mg | ORAL_TABLET | Freq: Three times a day (TID) | ORAL | 0 refills | Status: DC

## 2017-12-01 MED ORDER — MELATONIN 3 MG PO TABS: 1.5000 mg | ORAL_TABLET | Freq: Every day | ORAL | 0 refills | Status: DC

## 2017-12-01 MED ORDER — PANTOPRAZOLE SODIUM 40 MG PO TBEC: 40.0000 mg | DELAYED_RELEASE_TABLET | Freq: Every day | ORAL | 0 refills | Status: DC

## 2017-12-01 MED ORDER — PHENYTOIN SODIUM EXTENDED 200 MG PO CAPS: 400.0000 mg | ORAL_CAPSULE | Freq: Every day | ORAL | 0 refills | Status: DC

## 2017-12-01 MED ORDER — MUSCLE RUB 10-15 % EX CREA: 1.0000 "application " | TOPICAL_CREAM | Freq: Four times a day (QID) | CUTANEOUS | 0 refills | Status: DC

## 2017-12-01 NOTE — Progress Notes (Signed)
[facility] PHYSICAL MEDICINE & REHABILITATION PROGRESS NOTE  Subjective/Complaints:  Patient seen laying in bed this morning.  He states he slept well overnight.  He states he has persistent short-term memory deficits.  He is pleasant.  ROS:  Denies CP, S OB, nausea, vomiting, diarrhea.  Objective: Vital Signs: Blood pressure (!) 144/87, pulse 67, temperature 97.9 F (36.6 C), temperature source Oral, resp. rate 18, height 5\' 9"  (1.753 m), weight 133.6 kg, SpO2 97 %. No results found. Recent Labs    11/29/17 0556  WBC 9.3  HGB 12.2*  HCT 39.8  PLT 234   Recent Labs    11/29/17 0556  NA 139  K 4.2  CL 100  CO2 30  GLUCOSE 163*  BUN 21*  CREATININE 1.33*  CALCIUM 8.6*    Physical Exam: BP (!) 144/87 (BP Location: Left Arm)   Pulse 67   Temp 97.9 F (36.6 C) (Oral)   Resp 18   Ht 5\' 9"  (1.753 m)   Wt 133.6 kg   SpO2 97%   BMI 43.50 kg/m  Constitutional: He appearswell-developed.  Obese. HENT: Normocephalicand atraumatic.  Eyes:EOMare normal.  No discharge.  Cardiovascular:RRR.  No JVD. Respiratory:Effort normal and breath sounds normal.  UE:AVWU.Bowel sounds are normal.  Musculoskeletal: He exhibits  edema in lower extremities, improving.  He exhibits notenderness.  Neurological: He isalert and oriented x3, except for date of month Able to follow simple motor commands.  Motor:  LUE: 4+/5 proximal to distal, stable LLE: Hip flexion, knee extension 4/5, ankle dorsiflexion 3+/5, unchanged Skin: Skin iswarmand dry.  Psychiatric: Normal affect.  Normal behavior.    Assessment/Plan: 1. Functional deficits secondary to debility which require 3+ hours per day of interdisciplinary therapy in a comprehensive inpatient rehab setting.  Physiatrist is providing close team supervision and 24 hour management of active medical problems listed below.  Physiatrist and rehab team continue to assess barriers to discharge/monitor patient progress toward  functional and medical goals  Care Tool:  Bathing  Bathing activity did not occur: Refused Body parts bathed by patient: Right arm, Left arm, Chest, Abdomen, Front perineal area, Right upper leg, Left upper leg, Face   Body parts bathed by helper: Buttocks, Left lower leg, Right lower leg Body parts n/a: Buttocks   Bathing assist Assist Level: Minimal Assistance - Patient > 75%     Upper Body Dressing/Undressing Upper body dressing   What is the patient wearing?: Pull over shirt    Upper body assist Assist Level: Set up assist    Lower Body Dressing/Undressing Lower body dressing      What is the patient wearing?: Pants     Lower body assist Assist for lower body dressing: Minimal Assistance - Patient > 75%     Toileting Toileting Toileting Activity did not occur (Clothing management and hygiene only): N/A (no void or bm)  Toileting assist Assist for toileting: Minimal Assistance - Patient > 75%     Transfers Chair/bed transfer  Transfers assist     Chair/bed transfer assist level: Supervision/Verbal cueing     Locomotion Ambulation   Ambulation assist   Ambulation activity did not occur: Safety/medical concerns  Assist level: Supervision/Verbal cueing Assistive device: Walker-rolling Max distance: 121ft   Walk 10 feet activity   Assist  Walk 10 feet activity did not occur: Safety/medical concerns  Assist level: Supervision/Verbal cueing Assistive device: Walker-rolling   Walk 50 feet activity   Assist Walk 50 feet with 2 turns activity did not occur: Safety/medical  concerns  Assist level: Supervision/Verbal cueing Assistive device: Walker-rolling    Walk 150 feet activity   Assist Walk 150 feet activity did not occur: Safety/medical concerns  Assist level: Supervision/Verbal cueing Assistive device: Walker-rolling    Walk 10 feet on uneven surface  activity   Assist Walk 10 feet on uneven surfaces activity did not occur:  Safety/medical concerns         Wheelchair     Assist   Type of Wheelchair: Manual    Wheelchair assist level: Supervision/Verbal cueing Max wheelchair distance: 150rf    Wheelchair 50 feet with 2 turns activity    Assist        Assist Level: Supervision/Verbal cueing   Wheelchair 150 feet activity     Assist     Assist Level: Supervision/Verbal cueing      Medical Problem List and Plan: 1. Weakness, limitation self-care secondary to debility.             Continue CIR  Plan for d/c tomorrow  Will see patient for transitional care management in 1-2 weeks post-discharge 2. DVT Prophylaxis/Anticoagulation: Pharmaceutical: Lovenox 3. Pain Management: tylenol prn.   Gabapentin 100 3 times daily started on 10/6  Improved  4. Mood: LCSW to follow for evaluation and support.  5. Neuropsych: This patient is not capable of making decisions on his own behalf. 6. Skin/Wound Care: Routine pressure relief measures.  7. Fluids/Electrolytes/Nutrition: Strict I/O. Monitor weights daily. Low salt diet.  8. T2DM: Educated patient on dietary restrictions. Monitor blood sugars AC at bedtime. Titrate medications for tighter control             Lantus 30 units at bedtime  Lantus 5 units daily added on 10/5, increased to 10 units on 10/9             NovoLog 3 units 3 times daily             SSI  ?  Trending back up on 10/11, will need amatory follow-up 9. HTN: Blood pressures twice daily.              Continue hydralazine 100 3 times daily             Slightly elevated on 10/11 10. Chronic renal failure: Will need work-up on hematuria and proteinuria after discharge.             Creatinine 1.33 on 10/9  Encourage fluids 11. New onset seizures: Continue Keppra, Vimpat, Dilantin. LFTs within normal limits. Will have pharmacy follow for assistance/adjustment of dose.   Phenytoin level low on 10/10 12. Fluid overload: Likely due to illness and hypoalbuminemia.  Continue to monitor daily weights. Changed glucerna to prostat to avoid hyperglycemia.               Filed Weights   11/27/17 0500 11/28/17 0500 12/01/17 0500  Weight: 132.3 kg 134.3 kg 133.6 kg   Stable on 10/10  Lasix 40 mg x1 ordered on 10/8  Lasix 20 daily started on 10/10 13 Obesity hypoventilation syndrome: Encourage CPAP use at bedtime 14. Morbid Obesity: Consulted dietician for dietary education. Discussed importance of weight loss and activity to help promote health and mobility. Will need reinforcement.  15.  Hypoalbuminemia             Supplement initiated 16.  Hyponatremia: Resolved             Sodium 139 on 10/9  Continue to monitor 17.  Acute blood loss anemia:   Hemoglobin 12.2 on 10/9             Continue to monitor 18.  Sleep disturbance  Melatonin started on 10/6  Improving   LOS: 9 days A FACE TO FACE EVALUATION WAS PERFORMED  Daniel [name] Daniel [name] 12/01/2017, 8:06 AM

## 2017-12-01 NOTE — Discharge Instructions (Signed)
Inpatient Rehab Discharge Instructions  ROSHUN KLINGENSMITH Discharge date and time:  12/02/17  Activities/Precautions/ Functional Status: Activity: no lifting, driving, or strenuous exercise till cleared by MD. No driving for 6 months per [place] law.  Diet: cardiac diet and diabetic diet Wound Care: none needed    Functional status:  ___ No restrictions     ___ Walk up steps independently _X__ 24/7 supervision/assistance   ___ Walk up steps with assistance ___ Intermittent supervision/assistance  ___ Bathe/dress independently ___ Walk with walker     _X__ Bathe/dress with assistance ___ Walk Independently    ___ Shower independently ___ Walk with assistance    ___ Shower with assistance _X__ No alcohol     ___ Return to work/school ________     COMMUNITY REFERRALS UPON DISCHARGE:    Home Health:   PT     OT    ST                      Agency:  [facility] Phone: [phone]   Medical Equipment/Items Ordered:  Wheelchair, walker, commode and tub bench                                                       Agency/Supplier:  [facility] @ [phone]   Special Instructions: 1. Get at least 8 hours uninterrupted sleep daily. Avoid strobe lights, high altitudes/mountains or swimming.  No hot tubs or tub baths. No alcohol.  Always sit when holding holding babies.  2. Monitor blood sugars at least twice a day before meals--breakfast and supper on even days alternating with lunch and bedtime on odd days.  3. Wear you CPAP or BIPAP daily at bedtime or when asleep. 4. Wife to assist with medication management.    My questions have been answered and I understand these instructions. I will adhere to these goals and the provided educational materials after my discharge from the hospital.  Patient/Caregiver Signature _______________________________ Date __________  Clinician Signature _______________________________________ Date __________  Please bring this form and your medication  list with you to all your follow-up doctor's appointments.

## 2017-12-01 NOTE — Progress Notes (Signed)
Occupational Therapy Session Note  Patient Details  Name: Daniel [name] MRN: [id number] Date of Birth: 09-29-1965  Today's Date: 12/01/2017 OT Individual Time: 2836-6294 OT Individual Time Calculation (min): 57 min    Short Term Goals: Week 1:  OT Short Term Goal 1 (Week 1): Pt will complete LB bathing sit to stand with supervision and AE PRN. OT Short Term Goal 2 (Week 1): Pt will complete LB dressing sit to stand with supervision and AE. OT Short Term Goal 3 (Week 1): Pt will complete toilet transfer with supervision using RW to elevated toilet or 3:1.   Skilled Therapeutic Interventions/Progress Updates:    Pt completed oral hygiene standing at the sink with use of the walker and modified independence to start session.  Once completed, he ambulated to the therapy gym with supervision using the RW for support.  He then sat on a therapy mat while working on selective attention while working on a cognitive puzzle.  Pt with increased difficulty maintaining his attention on task secondary to therapist distracting him with conversation.  He took approximately 20 mins and min questioning cueing to complete puzzle that could have been completed in 5 mins or less with normal attention and cognition.  Pt aware that he was easily distracted by therapist conversation, however he could not stop talking as therapist distracted him.  Had pt ambulate back to the room to conclude session.  On one occasion he demonstrated dragging of his left foot and slight stumble, but he was able to correct without therapist having to keep him from falling.  Pt still with cognitive deficits as he could not recall therapist's name at start of session, but was able to remember at the end.  He was incorrect on day of the week and day of the month as well.   Pt left at EOB with spouse present.  Re-emphasized the need for him to have 24 hour supervision for all tasks that require standing or transferring at this time.  She voiced  understanding.    Therapy Documentation Precautions:  Precautions Precautions: Fall Restrictions Weight Bearing Restrictions: No  Pain:  No complain of pain during session ADL: See Care Plan section for details   Therapy/Group: Individual Therapy  Camera Krienke OTR/L 12/01/2017, 3:49 PM

## 2017-12-01 NOTE — Plan of Care (Signed)
  Problem: Consults Goal: Diabetes Guidelines if Diabetic/Glucose > 140 Description If diabetic or lab glucose is > 140 mg/dl - Initiate Diabetes/Hyperglycemia Guidelines & Document Interventions  Outcome: Progressing Goal: RH GENERAL PATIENT EDUCATION Description See Patient Education module for education specifics. Outcome: Progressing   Problem: RH BOWEL ELIMINATION Goal: RH STG MANAGE BOWEL WITH ASSISTANCE Description STG Manage Bowel with min Assistance.  Outcome: Progressing Flowsheets (Taken 12/01/2017 1505) STG: Pt will manage bowels with assistance: 6-Modified independent Goal: RH STG MANAGE BOWEL W/MEDICATION W/ASSISTANCE Description STG Manage Bowel with Medication with min Assistance.  Outcome: Progressing Flowsheets (Taken 12/01/2017 1505) STG: Pt will manage bowels with medication with assistance: 6-Modified independent   Problem: RH BLADDER ELIMINATION Goal: RH STG MANAGE BLADDER WITH ASSISTANCE Description STG Manage Bladder With min Assistance  Outcome: Progressing Flowsheets (Taken 12/01/2017 1505) STG: Pt will manage bladder with assistance: 5-Supervision/set up   Problem: RH SKIN INTEGRITY Goal: RH STG SKIN FREE OF INFECTION/BREAKDOWN Description With min. Assist.  Outcome: Progressing   Problem: RH SAFETY Goal: RH STG ADHERE TO SAFETY PRECAUTIONS W/ASSISTANCE/DEVICE Description STG Adhere to Safety Precautions With min-mod Assistance/Device.  Outcome: Progressing Flowsheets (Taken 12/01/2017 1505) STG:Pt will adhere to safety precautions with assistance/device: 4-Minimal assistance Goal: RH STG DECREASED RISK OF FALL WITH ASSISTANCE Description STG Decreased Risk of Fall With min-mod Assistance.  Outcome: Progressing Flowsheets (Taken 12/01/2017 1505) AXK:[id number] risk of fall  with assistance/device: 4-Minimal assistance   Problem: RH PAIN MANAGEMENT Goal: RH STG PAIN MANAGED AT OR BELOW PT'S PAIN GOAL Description Less than 3.  Outcome:  Progressing   Problem: RH KNOWLEDGE DEFICIT GENERAL Goal: RH STG INCREASE KNOWLEDGE OF SELF CARE AFTER HOSPITALIZATION Outcome: Progressing

## 2017-12-01 NOTE — Progress Notes (Signed)
Occupational Therapy Discharge Summary  Patient Details  Name: Daniel [name] MRN: [id number] Date of Birth: [birth year]  Today's Date: 12/01/2017 OT Individual Time: 4401-0272 OT Individual Time Calculation (min): 26 min    Session Note:  Therapist rolled pt down to the therapy day room in wheelchair during session.  He completed 1 set of 10 mins on the UE ergonometer in order to increase his overall endurance and UE strengthening.  Resistance set on level 8 with Random Program setting.  He was able to maintain RPMs at 24-28 for 10 mins to complete set.  Oxygen 96% on room air with HR at 92 at conclusion of session.  Pt able to recall therapist's name at start of session but as he was completing UE strengthening he would at times get the PT and this OT confused by name.  Finished session with return to the room with pt electing to transfer to the bed with supervision using the RW for support.  He was then able to remove his shoes with supervision before transitioning to supine.  Call button and phone in reach with bed alarm turned on.    Patient has met 10 of 12 long term goals due to improved balance, ability to compensate for deficits, improved attention and improved awareness.  Patient to discharge at overall Supervision level.  Patient's care partner is independent to provide the necessary physical and cognitive assistance at discharge.    Reasons goals not met: Pt still needs mod assist for selective attention.  Did not address meal prep goal as pt's spouse will provide 24 hour supervision.  Recommendation:  Patient will benefit from ongoing skilled OT services in home health setting to continue to advance functional skills in the area of BADL, iADL and Reduce care partner burden.  Pt still demonstrates significant cognitive deficits in the areas of attention and memory which need continued OT as it applies to selfcare and IADL tasks.  He also still exhibits decreased overall endurance as well  as LE weakness and decreased balance resulting in the need for DME to complete all transfers to the toilet and shower.  Feel continued HHOT is a must as pt is not anywhere near his PLOF.  Feel with continued HHOT and time pt can reach modified independent to independent level.    Equipment: wide 3:1 and tub bench  Reasons for discharge: treatment goals met and discharge from hospital  Patient/family agrees with progress made and goals achieved: Yes  OT Discharge Precautions/Restrictions  Precautions Precautions: Fall Restrictions Weight Bearing Restrictions: No  Pain  No report of pain noted during session  ADL ADL Eating: Independent Where Assessed-Eating: Edge of bed Grooming: Modified independent Where Assessed-Grooming: Standing at sink Upper Body Bathing: Supervision/safety Where Assessed-Upper Body Bathing: Shower Lower Body Bathing: Supervision/safety Where Assessed-Lower Body Bathing: Shower Upper Body Dressing: Independent Where Assessed-Upper Body Dressing: Edge of bed Lower Body Dressing: Supervision/safety Where Assessed-Lower Body Dressing: Edge of bed Toileting: Supervision/safety Where Assessed-Toileting: Glass blower/designer: Close supervision Toilet Transfer Method: Counselling psychologist: Extra wide Engineer, technical sales Transfer: Close supervison Clinical cytogeneticist Method: Optometrist: Facilities manager: Close supervision Social research officer, government Method: Heritage manager: Grab bars Vision Baseline Vision/History: Wears glasses(blurry vision) Patient Visual Report: No change from baseline Vision Assessment?: No apparent visual deficits Additional Comments: Blurry vision, pt with need for new glasses per his report.  Therapist did not note any peripheral deficits or occulomotor deficits. Perception  Perception: Within  Functional Limits Praxis Praxis:  Intact Cognition Overall Cognitive Status: Impaired/Different from baseline Arousal/Alertness: Awake/alert Orientation Level: Oriented to person;Oriented to place;Disoriented to situation;Disoriented to time Attention: Sustained Sustained Attention: Impaired Sustained Attention Impairment: Functional complex;Verbal complex Selective Attention: Impaired Selective Attention Impairment: Functional complex;Functional basic Memory: Impaired Memory Impairment: Storage deficit;Decreased recall of new information Awareness: Impaired Awareness Impairment: Anticipatory impairment Problem Solving: Impaired Problem Solving Impairment: Functional basic;Functional complex Executive Function: Sequencing;Self Monitoring;Decision Radio broadcast assistant: Impaired Organizing: Impaired Organizing Impairment: Functional complex Decision Making: Impaired Decision Making Impairment: Functional complex Self Monitoring: Impaired Self Monitoring Impairment: Functional basic;Functional complex Self Correcting: Impaired Self Correcting Impairment: Functional complex Behaviors: Impulsive Safety/Judgment: Impaired Comments: Pt still exhibits decreased memory of current events completed daily.  He gets confused with which therapist did what with him as well as remembering their names.   Sensation Sensation Light Touch: Appears Intact(Sensation intact in BUEs ) Peripheral sensation comments: impaired in the distal BLE  Light Touch Impaired Details: Impaired RLE;Impaired LLE Hot/Cold: Appears Intact Proprioception: Appears Intact Stereognosis: Appears Intact Coordination Gross Motor Movements are Fluid and Coordinated: Yes Fine Motor Movements are Fluid and Coordinated: Yes Coordination and Movement Description: BUE coordination WFLs for self feeding and selfcare tasks.  Motor  Motor Motor: Other (comment) Motor - Skilled Clinical Observations: generalized weakness Motor - Discharge Observations:  still with generalized weakness in his LEs Mobility  Bed Mobility Bed Mobility: Rolling Right;Rolling Left;Supine to Sit;Sit to Supine Rolling Right: Independent with assistive device Rolling Left: Independent with assistive device Supine to Sit: Supervision/Verbal cueing Sit to Supine: Supervision/Verbal cueing Transfers Sit to Stand: Independent with assistive device Stand to Sit: Independent with assistive device  Trunk/Postural Assessment  Cervical Assessment Cervical Assessment: Within Functional Limits Thoracic Assessment Thoracic Assessment: Within Functional Limits Lumbar Assessment Lumbar Assessment: Within Functional Limits Postural Control Postural Control: Within Functional Limits  Balance Balance Balance Assessed: Yes Static Sitting Balance Static Sitting - Balance Support: No upper extremity supported Static Sitting - Level of Assistance: 7: Independent Dynamic Sitting Balance Dynamic Sitting - Balance Support: During functional activity;Feet supported Dynamic Sitting - Level of Assistance: 7: Independent Static Standing Balance Static Standing - Balance Support: Right upper extremity supported Static Standing - Level of Assistance: 5: Stand by assistance Dynamic Standing Balance Dynamic Standing - Balance Support: Bilateral upper extremity supported;During functional activity Dynamic Standing - Level of Assistance: 5: Stand by assistance Extremity/Trunk Assessment RUE Assessment RUE Assessment: Exceptions to [facility] Active Range of Motion (AROM) Comments: WFLS General Strength Comments: shoulder flexion 3+/5, elbow flexion/extension 4/5, grip 3+/5 LUE Assessment LUE Assessment: Exceptions to [facility] Active Range of Motion (AROM) Comments: AROM WFLS for all joints General Strength Comments: shoulder flexion 3+/5, elbow flexion/extension 4/5, grip 3+/5   Arayla Kruschke OTR/L 12/01/2017, 5:16 PM

## 2017-12-01 NOTE — Discharge Summary (Signed)
Physician Discharge Summary  Patient ID: Daniel [name] MRN: [id number] DOB/AGE: [birth year] 52 y.o.  Admit date: 11/22/2017 Discharge date: 12/02/2017  Discharge Diagnoses:  Principal Problem:   Debility Active Problems:   Obesity hypoventilation syndrome ([place])   Diabetes mellitus type 2 in obese (HCC)   Morbid obesity (HCC)   Seizures (HCC)   Stage 2 chronic kidney disease   Peripheral edema   Sleep disturbance   Discharged Condition: stable  Significant Diagnostic Studies: Ct Head Wo Contrast  Result Date: 11/06/2017 CLINICAL DATA:  52 y/o M; postictal, altered mental status, recent fall before admission, history of seizures. EXAM: CT HEAD WITHOUT CONTRAST TECHNIQUE: Contiguous axial images were obtained from the base of the skull through the vertex without intravenous contrast. COMPARISON:  10/25/2017 MRI head.  10/24/2017 CT head. FINDINGS: Brain: No evidence of acute infarction, hemorrhage, hydrocephalus, extra-axial collection or mass lesion/mass effect. 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 No hyperdense vessel. Skull: Normal. Negative for fracture or focal lesion. Sinuses/Orbits: Mild right posterior ethmoid air cell mucosal thickening. Additional visible paranasal sinuses and the mastoid air cells are normally aerated. Orbits are unremarkable. Other: None. IMPRESSION: 1. No acute intracranial abnormality. 2. 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 Electronically Signed   By: Kristine Garbe M.D.   On: 11/06/2017 21:52   US Renal  Result Date: 11/07/2017 CLINICAL DATA:  Acute renal failure EXAM: RENAL / URINARY TRACT ULTRASOUND COMPLETE COMPARISON:  Ultrasound 06/25/2016 FINDINGS: Right Kidney: Length: 12.9 cm. Echogenicity within normal limits. No mass or hydronephrosis visualized. Left Kidney: Length: 14 cm. Echogenicity within  normal limits. No mass or hydronephrosis visualized. Bladder: Appears normal for degree of bladder distention. Incidental note made of slightly echogenic liver. IMPRESSION: Negative renal ultrasound Electronically Signed   By: Donavan Foil M.D.   On: 11/07/2017 21:15   Dg Chest Port 1 View  Result Date: 11/20/2017 CLINICAL DATA:  PICC placement EXAM: PORTABLE CHEST 1 VIEW COMPARISON:  11/09/2017 FINDINGS: PICC line has been retracted and now loops in the right upper chest, possibly into the internal jugular vein with the tip in the right brachiocephalic vein. Cardiomegaly. Low lung volumes without confluent opacity or effusion. IMPRESSION: Right PICC line loops in the right upper chest, likely looping in the right internal jugular vein with the tip in the right brachiocephalic vein. Electronically Signed   By: Rolm Baptise M.D.   On: 11/20/2017 10:23   Dg Chest Port 1 View  Addendum Date: 11/09/2017   ADDENDUM REPORT: 11/09/2017 16:06 ADDENDUM: Recommend retracting the PICC line 3 cm. Electronically Signed   By: Kathreen Devoid   On: 11/09/2017 16:06   Result Date: 11/09/2017 CLINICAL DATA:  Right-sided PICC line insertion EXAM: PORTABLE CHEST 1 VIEW COMPARISON:  None. FINDINGS: There is a right-sided PICC line with the tip projecting over the right atrium. There is no focal parenchymal opacity. There is no pleural effusion or pneumothorax. The heart and mediastinal contours are unremarkable. The osseous structures are unremarkable. IMPRESSION: There is a right-sided PICC line with the tip projecting over the right atrium. Electronically Signed: By: Kathreen Devoid On: 11/09/2017 15:52   Dg Chest Port 1 View  Result Date: 11/05/2017 CLINICAL DATA:  Fever EXAM: PORTABLE CHEST 1 VIEW COMPARISON:  11/03/2017 FINDINGS: Heart size is normal. Soft feeding tube enters the abdomen. Mild patchy atelectasis and or infiltrate at both lung bases. No dense consolidation or lobar collapse.  No edema or effusions.  IMPRESSION: Patchy density at both lung bases consistent with atelectasis or mild pneumonia. Electronically Signed   By: Nelson Chimes M.D.   On: 11/05/2017 13:49   [place] Ekg Site Rite  Result Date: 11/09/2017 If Site Rite image not attached, placement could not be confirmed due to current cardiac rhythm.   Labs:  Basic Metabolic Panel: BMP Latest Ref Rng & Units 11/29/2017 11/27/2017 11/23/2017  Glucose 70 - 99 mg/dL 163(H) 197(H) 200(H)  BUN 6 - 20 mg/dL 21(H) 20 18  Creatinine 0.61 - 1.24 mg/dL 1.33(H) 1.43(H) 1.15  Sodium 135 - 145 mmol/L 139 135 134(L)  Potassium 3.5 - 5.1 mmol/L 4.2 4.0 4.7  Chloride 98 - 111 mmol/L 100 98 102  CO2 22 - 32 mmol/L 30 25 29   Calcium 8.9 - 10.3 mg/dL 8.6(L) 8.7(L) 7.8(L)    CBC: CBC Latest Ref Rng & Units 11/29/2017 11/27/2017 11/23/2017  WBC 4.0 - 10.5 K/uL 9.3 9.7 5.9  Hemoglobin 13.0 - 17.0 g/dL 12.2(L) 13.3 12.0(L)  Hematocrit 39.0 - 52.0 % 39.8 43.2 38.2(L)  Platelets 150 - 400 K/uL 234 275 281    CBG: Recent Labs  Lab 12/01/17 0626 12/01/17 1204 12/01/17 1701 12/01/17 2120 12/02/17 0635  GLUCAP 176* 122* 251* 115* 141*     Brief HPI:   Daniel [name] is a 51 year old male with history of T2DM, morbid obesity, 3-4 weeks of hallucinations with progressive encephalopathy and fall who was admitted to outside hospital with malignant hypertension and periods of unresponsiveness due to seizures.  He was started on Keppra and Vimpat but continued to have persistent episodes and was transferred to [facility] on 10/27/2017 for continuous EEG monitoring.  He was intubated for airway protection due to status epilepticus and neurology consulted for input on AEDs  Hospital course significant for CAP, dysphagia, recurrent fevers, superficial thrombosis LUE, acute on chronic renal failure poorly controlled blood sugars as well as waxing and waning of mental status.  Renal status was improving and encephalopathy is resolving.  Therapy initiated and  patient was noted to be severely deconditioned.  CIR was recommended due to functional deficits   Hospital Course: Daniel [name] was admitted to rehab 11/22/2017 for inpatient therapies to consist of PT, ST and OT at least three hours five days a week. Past admission physiatrist, therapy team and rehab RN have worked together to provide customized collaborative inpatient rehab. He has been seizure free during his rehab stay on current regimen. Pharmacy has been assisting with dilantin dosing and dilantin level corrects to therapeutic goal on 400 mg am and 300 mg HS.  He is tolerating AEDs without side effects.   Gabapetin was added to help with pain management.    Blood pressures have been monitored on bid basis and lasix was added to help with tighter control as well as manage peripheral edema.  Protein supplements were added to help with low calorie malnutrition and albumin has improved from 2.4-->2.9. Serial lab showed that hyponatremia has resolved and SCr is trending to baseline. ABLA is stable.  Bowel program was augmented to help manage constipation.  He is continent of bowel and bladder.  He has been educated on importance of using his CPAP for overall health benefits and is showing with use during the stay.  Melatonin was started to help manage sleep disturbance.  P.o. intake has been good and he has been educated multiple times regarding carb modified diet and need for compliance.  Blood  sugars have been monitored on AC/HS basis and he is to continue on Lantus twice daily with meal coverage.    Rehab course: During patient's stay in rehab weekly team conferences were held to monitor patient's progress, set goals and discuss barriers to discharge. At admission, patient required mod assist with mobility and basic self-care tasks.  Cognitive evaluations showed severe impairments of memory with patient needing frequent redirection to sit stated task or topic at hand. He has had improvement in activity  tolerance, balance, attention as well as awareness of deficits.  He is able to complete ADL task with supervision. He is independent for transfers and is able to ambulate 150' with RW. He requires supervision due to mild cognitive deficits related to memory. He continues to have verbosity and needs redirection due to tendency to be tangential.  Wife has been present for support during most of his stay and has been educated on all aspects of care, safety, medication management and follow up.     Disposition: Home   Diet: Carb Modified Medium.   Special Instructions: 1. Will need work up of proteinuria.   2. Continue to monitor dilantin levels and adjust dose as needed. 3.  No driving for 6 months per [place] law.  Avoid high altitudes, diving, swimming, hot tubs or tub baths.  4.  Use CPAP at nights.  Discharge Instructions    Ambulatory referral to Neurology   Complete by:  As directed    An appointment is requested in approximately: 2 weeks for follow up new onset seizures   Ambulatory referral to Physical Medicine Rehab   Complete by:  As directed    1-2 weeks transitional care appt     Allergies as of 12/02/2017      Reactions   Montelukast Other (See Comments)   Possibly cause headaches per spouse      Medication List    STOP taking these medications   HYDROcodone-acetaminophen 5-325 MG tablet Commonly known as:  NORCO/VICODIN   LEVEMIR FLEXTOUCH 100 UNIT/ML Pen Generic drug:  Insulin Detemir   lisinopril 10 MG tablet Commonly known as:  PRINIVIL,ZESTRIL   metFORMIN 500 MG tablet Commonly known as:  GLUCOPHAGE   omeprazole 20 MG capsule Commonly known as:  PRILOSEC   torsemide 20 MG tablet Commonly known as:  DEMADEX     TAKE these medications   acetaminophen 325 MG tablet Commonly known as:  TYLENOL Take 1-2 tablets (325-650 mg total) by mouth every 4 (four) hours as needed for mild pain.   aspirin EC 81 MG tablet Take 81 mg by mouth daily.   fluticasone  furoate-vilanterol 200-25 MCG/INH Aepb Commonly known as:  BREO ELLIPTA Inhale 1 puff into the lungs daily.   furosemide 20 MG tablet Commonly known as:  LASIX Take 1 tablet (20 mg total) by mouth daily.   gabapentin 100 MG capsule Commonly known as:  NEURONTIN Take 1 capsule (100 mg total) by mouth 3 (three) times daily.   hydrALAZINE 100 MG tablet Commonly known as:  APRESOLINE Take 1 tablet (100 mg total) by mouth 3 (three) times daily.   insulin aspart 100 UNIT/ML FlexPen Commonly known as:  NOVOLOG Inject 5 Units into the skin 3 (three) times daily with meals. Notes to patient:  Use if blood sugar is over 80 AND YOU ARE PLANNING ON EATING A MEAL.    Insulin Glargine 100 UNIT/ML Solostar Pen Commonly known as:  LANTUS Use 10  units in morning and 30 units at  bedtime   Insulin Pen Needle 32G X 6 MM Misc 1 application by Does not apply route 2 (two) times daily.   lacosamide 200 MG Tabs tablet Commonly known as:  VIMPAT Take 1 tablet (200 mg total) by mouth 2 (two) times daily.   levETIRAcetam 1000 MG tablet Commonly known as:  KEPPRA Take 1 tablet (1,000 mg total) by mouth 2 (two) times daily.   levothyroxine 50 MCG tablet Commonly known as:  SYNTHROID, LEVOTHROID Take 1 tablet (50 mcg total) by mouth daily.   Melatonin 3 MG Tabs Take 0.5 tablets (1.5 mg total) by mouth at bedtime.   metoprolol succinate 50 MG 24 hr tablet Commonly known as:  TOPROL-XL Take 1 tablet (50 mg total) by mouth daily. Take with or immediately following a meal.   MUSCLE RUB 10-15 % Crea Apply 1 application topically 4 (four) times daily. To bilateral legs   pantoprazole 40 MG tablet Commonly known as:  PROTONIX Take 1 tablet (40 mg total) by mouth daily.   phenytoin 300 MG ER capsule Commonly known as:  DILANTIN Take 1 capsule (300 mg total) by mouth at bedtime.   phenytoin 200 MG ER capsule Commonly known as:  DILANTIN Take 2 capsules (400 mg total) by mouth daily.    polyethylene glycol packet Commonly known as:  MIRALAX / GLYCOLAX Take 17 g by mouth 2 (two) times daily.   PROVENTIL HFA 108 (90 Base) MCG/ACT inhaler Generic drug:  albuterol Inhale 1 puff into the lungs every 6 (six) hours as needed for wheezing or shortness of breath.      Follow-up Information    Jamse Arn, MD Follow up.   Specialty:  Physical Medicine and Rehabilitation Why:  Office will call you for follow up appointment Contact information: [address] [place] [address] [phone]        Antonietta Jewel, MD. Go in 2 week(s).   Specialty:  Internal Medicine Why:  for hospital follow up visit--OR [facility] Contact information: [address] [place]. [address] [place] [address] [phone]        [facility] Follow up.   Specialty:  Neurology Why:  Office will call you with follow up appointment Contact information: [address] [place] [phone]          Signed: Bary Leriche 12/03/2017, 10:41 PM

## 2017-12-01 NOTE — Progress Notes (Signed)
Social Work  Discharge Note  The overall goal for the admission was met for:   Discharge location: Yes - home with spouse who can provide 24/7 assistance  Length of Stay: Yes - 10 days (with d/c on 12/02/17)  Discharge activity level: Yes - supervision to min assist overall  Home/community participation: Yes  Services provided included: MD, RD, PT, OT, SLP, RN, TR, Pharmacy, Neuropsych and SW  Financial Services:  Medicaid and SSD apps pending still at time of d/c.  Follow-up services arranged: Home Health: PT, OT, ST via [place], DME: 20x18 lightweight w/c, cushion, wide rolling walker, wide 3n1 commode and tub bench - [place] and Patient/Family has no preference for HH/DME agencies  Comments (or additional information):  Referred pt to [facility] program for med assist x 1 month  Patient/Family verbalized understanding of follow-up arrangements: Yes  Individual responsible for coordination of the follow-up plan: pt/wife  Confirmed correct DME delivered: Kimberlye Dilger 12/01/2017    Danyetta Gillham

## 2017-12-01 NOTE — Progress Notes (Signed)
Speech Language Pathology Discharge Summary  Patient Details  Name: Daniel [name] MRN: [id number] Date of Birth: Apr 04, 1965  Today's Date: 12/01/2017 SLP Individual Time: 8466-5993 SLP Individual Time Calculation (min): 50 min   Skilled Therapeutic Interventions:  Pt was seen for skilled ST targeting cognitive goals.  Pt was verbose and tangential upon therapist's arrival but was easily redirected to functional tasks.  SLP administered the MoCA-Basic to measure progress from initial evaluation.  Pt scored 18/30 on assessment with deficits most notable for delayed recall.  Pt did miss points on visuoperceptual tasks but SLP suspects this to be more related to decreased strength of pt's reading glasses as he often appeared to strain to see images and read words.  Pt had questions about improving his memory in the home environment and SLP provided skilled education regarding memory compensatory strategies.  All questions were answered to pt's satisfaction at this time.       Patient has met 3 of 3 long term goals.  Patient to discharge at overall Supervision level.  Reasons goals not met:     Clinical Impression/Discharge Summary:   Pt has made functional gains while inpatient and is discharging having met 3 out of 3 short term goals.  Pt is currently supervision for tasks due to mild cognitive deficits specifically related to memory.  Pt/family education is complete at this time.  Pt has demonstrated improved awareness of his deficits and attention to tasks.  Pt is discharging home with recommendations for ST follow up at next level of care.     Care Partner:  Caregiver Able to Provide Assistance: Yes  Type of Caregiver Assistance: Physical;Cognitive  Recommendation:  24 hour supervision/assistance;Home Health SLP  Rationale for SLP Follow Up: Maximize cognitive function and independence;Reduce caregiver burden   Equipment: none recommended by SLP    Reasons for discharge: Discharged from  hospital   Patient/Family Agrees with Progress Made and Goals Achieved: Yes    Kamden Stanislaw, Selinda Orion 12/01/2017, 10:59 AM

## 2017-12-01 NOTE — Progress Notes (Signed)
Physical Therapy Discharge Summary  Patient Details  Name: Daniel [name] MRN: [id number] Date of Birth: May 18, 1965  Today's Date: 12/01/2017 PT Individual Time: 1530-1630   60 min    Patient has met 8 of 8 long term goals due to improved activity tolerance, improved balance, improved postural control, increased strength, increased range of motion and decreased pain.  Patient to discharge at an ambulatory level Supervision.   Patient's care partner is independent to provide the necessary physical and cognitive assistance at discharge.  Reasons goals not met: All PT goals met  Recommendation:  Patient will benefit from ongoing skilled PT services in home health setting to continue to advance safe functional mobility, address ongoing impairments in balance, memory, transfers, strength, safety, and minimize fall risk.  Equipment: RW, WC.   Reasons for discharge: treatment goals met and discharge from hospital  Patient/family agrees with progress made and goals achieved: Yes   PT Treatment:  PT instructed pt in Grad day assessment to measure progress toward goals. See below for details. Patient returned to room and left sitting in [facility] with call bell in reach and all needs met.     PT Discharge Precautions/Restrictions Precautions Precautions: Fall Restrictions Weight Bearing Restrictions: No Vital Signs Therapy Vitals Temp: 98.4 F (36.9 C) Pulse Rate: 70 Resp: 16 BP: 139/75 Patient Position (if appropriate): Sitting Oxygen Therapy SpO2: 97 % O2 Device: Room Air Pain   denies  Vision/Perception  Perception Perception: Within Functional Limits Praxis Praxis: Intact  Cognition Overall Cognitive Status: Impaired/Different from baseline Arousal/Alertness: Awake/alert Orientation Level: Oriented to person;Oriented to place;Oriented to situation;Disoriented to time Attention: Selective Selective Attention: Impaired Selective Attention Impairment: Functional  complex;Verbal complex Memory: Impaired Memory Impairment: Decreased recall of new information Awareness: Impaired Awareness Impairment: Anticipatory impairment Problem Solving: Appears intact Executive Function: Organizing;Self Correcting;Self Monitoring;Decision Making Organizing: Impaired Organizing Impairment: Functional complex Decision Making: Impaired Decision Making Impairment: Functional complex Self Monitoring: Impaired Self Monitoring Impairment: Functional complex Self Correcting: Impaired Self Correcting Impairment: Functional complex Behaviors: Impulsive Safety/Judgment: Impaired  Sensation Sensation Light Touch: Impaired Detail Peripheral sensation comments: impaired in the distal BLE  Light Touch Impaired Details: Impaired RLE;Impaired LLE Coordination Gross Motor Movements are Fluid and Coordinated: No Fine Motor Movements are Fluid and Coordinated: Yes Coordination and Movement Description: decreased coordination in BLE due to strength deficits.  Motor  Motor Motor: Other (comment) Motor - Skilled Clinical Observations: generalized weakness Motor - Discharge Observations: improved endurance and strength from Eval  Mobility Bed Mobility Bed Mobility: Rolling Right;Rolling Left;Supine to Sit;Sit to Supine Rolling Right: Independent with assistive device Rolling Left: Independent with assistive device Sit to Supine: Independent with assistive device Transfers Transfers: Sit to Stand;Stand to Sit;Stand Pivot Transfers Sit to Stand: Independent with assistive device Stand to Sit: Independent with assistive device Stand Pivot Transfers: Set up assist Transfer (Assistive device): Rolling walker Locomotion  Gait Ambulation: Yes Gait Assistance: Supervision/Verbal cueing Gait Distance (Feet): 150 Feet Assistive device: Rolling walker Gait Gait: Yes Gait Pattern: Step-through pattern Stairs / Additional Locomotion Stairs: Yes Stair Management Technique:  One rail Left Number of Stairs: 8 Height of Stairs: 6 Wheelchair Mobility Wheelchair Mobility: Yes Wheelchair Assistance: Chartered loss adjuster: Both upper extremities Wheelchair Parts Management: Supervision/cueing Distance: 152f  Trunk/Postural Assessment  Cervical Assessment Cervical Assessment: Within Functional Limits Thoracic Assessment Thoracic Assessment: Within Functional Limits Lumbar Assessment Lumbar Assessment: Within Functional Limits Postural Control Postural Control: Within Functional Limits  Balance Balance Balance Assessed: Yes Static Sitting Balance Static Sitting -  Balance Support: No upper extremity supported Dynamic Sitting Balance Dynamic Sitting - Balance Support: Right upper extremity supported Static Standing Balance Static Standing - Balance Support: Right upper extremity supported Static Standing - Level of Assistance: 5: Stand by assistance Dynamic Standing Balance Dynamic Standing - Balance Support: Bilateral upper extremity supported Dynamic Standing - Level of Assistance: 5: Stand by assistance Extremity Assessment      RLE Assessment RLE Assessment: Exceptions to [facility] General Strength Comments: grossly 4+/5 proximal to distal  LLE Assessment LLE Assessment: Exceptions to [facility] General Strength Comments: 4+/5 hip flexion/ adduction/abduction and knee extension. 4/5 knee flexion and ankle DF    Daniel [name] 12/01/2017, 4:04 PM

## 2017-12-02 LAB — GLUCOSE, CAPILLARY: GLUCOSE-CAPILLARY: 141 mg/dL — AB (ref 70–99)

## 2017-12-02 NOTE — Progress Notes (Signed)
Patient discharged to home per wheelchair accompanied by NT ; Wife in here to bring patient home. Discharged intsructions done yesterday. Questions answered re: discharge medications no further questions noted.

## 2017-12-02 NOTE — Progress Notes (Signed)
[facility] PHYSICAL MEDICINE & REHABILITATION PROGRESS NOTE  Subjective/Complaints:   No problems overnite, nervous about going home  ROS:  Denies CP, S OB, nausea, vomiting, diarrhea.  Objective: Vital Signs: Blood pressure 140/69, pulse 74, temperature 97.6 F (36.4 C), resp. rate 18, height 5\' 9"  (1.753 m), weight 131.5 kg, SpO2 96 %. No results found. No results for input(s): WBC, HGB, HCT, PLT in the last 72 hours. No results for input(s): NA, K, CL, CO2, GLUCOSE, BUN, CREATININE, CALCIUM in the last 72 hours.  Physical Exam: BP 140/69 (BP Location: Left Arm)   Pulse 74   Temp 97.6 F (36.4 C)   Resp 18   Ht 5\' 9"  (1.753 m)   Wt 131.5 kg   SpO2 96%   BMI 42.81 kg/m  Constitutional: He appearswell-developed.  Obese. HENT: Normocephalicand atraumatic.  Eyes:EOMare normal.  No discharge.  Cardiovascular:RRR.  No JVD. Respiratory:Effort normal and breath sounds normal.  QQ:PYPP.Bowel sounds are normal.  Musculoskeletal: He exhibits  edema in lower extremities, improving.  He exhibits notenderness.  Neurological: He isalert and oriented x3, except for date of month Able to follow simple motor commands.  Motor:  LUE: 4+/5 proximal to distal, stable LLE: Hip flexion, knee extension 4/5, ankle dorsiflexion 3+/5, unchanged Skin: Skin iswarmand dry.  Psychiatric: Normal affect.  Normal behavior.    Assessment/Plan: 1. Functional deficits secondary to debility which require 3+ hours per day of interdisciplinary therapy in a comprehensive inpatient rehab setting.  Physiatrist is providing close team supervision and 24 hour management of active medical problems listed below.  Physiatrist and rehab team continue to assess barriers to discharge/monitor patient progress toward functional and medical goals  Care Tool:  Bathing  Bathing activity did not occur: Refused Body parts bathed by patient: Right arm, Left arm, Chest, Abdomen, Front perineal area, Right  upper leg, Left upper leg, Face, Right lower leg, Left lower leg, Buttocks   Body parts bathed by helper: Buttocks, Left lower leg, Right lower leg Body parts n/a: Buttocks   Bathing assist Assist Level: Supervision/Verbal cueing     Upper Body Dressing/Undressing Upper body dressing   What is the patient wearing?: Pull over shirt    Upper body assist Assist Level: Independent    Lower Body Dressing/Undressing Lower body dressing      What is the patient wearing?: Pants     Lower body assist Assist for lower body dressing: Supervision/Verbal cueing     Toileting Toileting Toileting Activity did not occur (Clothing management and hygiene only): N/A (no void or bm)  Toileting assist Assist for toileting: Supervision/Verbal cueing     Transfers Chair/bed transfer  Transfers assist     Chair/bed transfer assist level: Supervision/Verbal cueing     Locomotion Ambulation   Ambulation assist   Ambulation activity did not occur: Safety/medical concerns  Assist level: Supervision/Verbal cueing Assistive device: Walker-rolling Max distance: 100'   Walk 10 feet activity   Assist  Walk 10 feet activity did not occur: Safety/medical concerns  Assist level: Supervision/Verbal cueing Assistive device: Walker-rolling   Walk 50 feet activity   Assist Walk 50 feet with 2 turns activity did not occur: Safety/medical concerns  Assist level: Supervision/Verbal cueing Assistive device: Walker-rolling    Walk 150 feet activity   Assist Walk 150 feet activity did not occur: Safety/medical concerns  Assist level: Supervision/Verbal cueing Assistive device: Walker-rolling    Walk 10 feet on uneven surface  activity   Assist Walk 10 feet on uneven surfaces  activity did not occur: Safety/medical concerns         Wheelchair     Assist   Type of Wheelchair: Manual    Wheelchair assist level: Supervision/Verbal cueing Max wheelchair distance: 150rf     Wheelchair 50 feet with 2 turns activity    Assist        Assist Level: Supervision/Verbal cueing   Wheelchair 150 feet activity     Assist     Assist Level: Supervision/Verbal cueing      Medical Problem List and Plan: 1. Weakness, limitation self-care secondary to debility.             Continue CIR  Plan for d/c today  Will see patient for transitional care management in 1-2 weeks post-discharge 2. DVT Prophylaxis/Anticoagulation: Pharmaceutical: Lovenox 3. Pain Management: tylenol prn.   Gabapentin 100 3 times daily started on 10/6  Improved  4. Mood: LCSW to follow for evaluation and support.  5. Neuropsych: This patient is not capable of making decisions on his own behalf. 6. Skin/Wound Care: Routine pressure relief measures.  7. Fluids/Electrolytes/Nutrition: Strict I/O. Monitor weights daily. Low salt diet.  8. T2DM: Educated patient on dietary restrictions. Monitor blood sugars AC at bedtime. Titrate medications for tighter control             Lantus 30 units at bedtime  Lantus 5 units daily added on 10/5, increased to 10 units on 10/9             NovoLog 3 units 3 times daily             SSI CBG (last 3)  Recent Labs    12/01/17 1701 12/01/17 2120 12/02/17 0635  GLUCAP 251* 115* 141*  Generally controlled f/u PCP 9. HTN: Blood pressures twice daily.              Continue hydralazine 100 3 times daily             Slightly elevated on 10/11 10. Chronic renal failure: Will need work-up on hematuria and proteinuria after discharge.             Creatinine 1.33 on 10/9  Encourage fluids 11. New onset seizures: Continue Keppra, Vimpat, Dilantin. LFTs within normal limits. Will have pharmacy follow for assistance/adjustment of dose.   Phenytoin level low on 10/10 12. Fluid overload: Likely due to illness and hypoalbuminemia. Continue to monitor daily weights. Changed glucerna to prostat to avoid hyperglycemia.               Filed Weights    11/28/17 0500 12/01/17 0500 12/02/17 0617  Weight: 134.3 kg 133.6 kg 131.5 kg   Wt trending down now on lower dose lasix  Lasix 40 mg x1 ordered on 10/8  Lasix 20 daily started on 10/10 13 Obesity hypoventilation syndrome: Encourage CPAP use at bedtime 14. Morbid Obesity: Consulted dietician for dietary education. Discussed importance of weight loss and activity to help promote health and mobility. Will need reinforcement.  15.  Hypoalbuminemia             Supplement initiated 16.  Hyponatremia: Resolved             Sodium 139 on 10/9             Continue to monitor 17.  Acute blood loss anemia:   Hemoglobin 12.2 on 10/9             Continue to monitor 18.  Sleep disturbance  Melatonin  started on 10/6  Improving   LOS: 10 days A FACE TO FACE EVALUATION WAS PERFORMED  Charlett Blake 12/02/2017, 7:42 AM

## 2017-12-04 ENCOUNTER — Encounter: Payer: Self-pay | Admitting: Physical Medicine & Rehabilitation

## 2017-12-05 ENCOUNTER — Telehealth: Payer: Self-pay | Admitting: *Deleted

## 2017-12-05 NOTE — Telephone Encounter (Signed)
Transitional Care call-I spoke with wife Wells Guiles    1. Are you/is patient experiencing any problems since coming home? Are there any questions regarding any aspect of care? NO 2. Are there any questions regarding medications administration/dosing? Are meds being taken as prescribed? Patient should review meds with caller to confirm YES THEY HAVE ALL BUT THE SHORT ACTING NOVOLOG, BUT HAS HIS LONG ACTING LANTUS--THEY WERE OVER LIMIT ([place]) HE IS MONITORING HIS BLOOD SUGARS. 3. Have there been any falls?NO 4. Has Home Health been to the house and/or have they contacted you? If not, have you tried to contact them? Can we help you contact them?YES HE WAS SEEN YESTERDAY AND HAS AN APPT TODAY AT 2:45 5. Are bowels and bladder emptying properly? Are there any unexpected incontinence issues? If applicable, is patient following bowel/bladder programs?NO PROBLEMS 6. Any fevers, problems with breathing, unexpected pain?NO 7. Are there any skin problems or new areas of breakdown?NO 8. Has the patient/family member arranged specialty MD follow up (ie cardiology/neurology/renal/surgical/etc)?  Can we help arrange? APPT GIVEN TO SEE DR PATEL 9. Does the patient need any other services or support that we can help arrange? NO 10. Are caregivers following through as expected in assisting the patient?YES 11. Has the patient quit smoking, drinking alcohol, or using drugs as recommended? NO DRIVING for at least 6 mo and avoid high altitudes,diving,swimming,hot tubs, or tub.  Appointment Thursday 12/14/17 @10 :40 ARRIVE BY 10:20 TO SEE DR. PATEL NOTIFIED TO LOOK FOR PAPERWORK IN MAIL AND ADDRESS REVIEWED [address] [phone]

## 2017-12-13 ENCOUNTER — Telehealth: Payer: Self-pay | Admitting: *Deleted

## 2017-12-13 NOTE — Telephone Encounter (Signed)
Patients wife called stating that [facility] needs hospital discharge notes.  They told the patient that hospital discharge summary was not acceptable and that they need the discharge notes from the hospital.  She provided a contact name Max Sane and a Fax# [phone].  Please contact patients wife Wells Guiles if you have any questions [phone].

## 2017-12-13 NOTE — Telephone Encounter (Signed)
[employer] phone land [phone] ext [phone]

## 2017-12-14 ENCOUNTER — Encounter: Payer: Medicaid Other | Attending: Physical Medicine & Rehabilitation | Admitting: Physical Medicine & Rehabilitation

## 2017-12-14 ENCOUNTER — Encounter: Payer: Self-pay | Admitting: Physical Medicine & Rehabilitation

## 2017-12-14 ENCOUNTER — Other Ambulatory Visit: Payer: Self-pay

## 2017-12-14 VITALS — BP 109/67 | HR 72 | Ht 70.0 in | Wt 285.0 lb

## 2017-12-14 DIAGNOSIS — E119 Type 2 diabetes mellitus without complications: Secondary | ICD-10-CM

## 2017-12-14 DIAGNOSIS — Z79899 Other long term (current) drug therapy: Secondary | ICD-10-CM | POA: Diagnosis not present

## 2017-12-14 DIAGNOSIS — Z794 Long term (current) use of insulin: Secondary | ICD-10-CM | POA: Insufficient documentation

## 2017-12-14 DIAGNOSIS — Z87891 Personal history of nicotine dependence: Secondary | ICD-10-CM | POA: Diagnosis not present

## 2017-12-14 DIAGNOSIS — I5041 Acute combined systolic (congestive) and diastolic (congestive) heart failure: Secondary | ICD-10-CM

## 2017-12-14 DIAGNOSIS — Z79890 Hormone replacement therapy (postmenopausal): Secondary | ICD-10-CM | POA: Insufficient documentation

## 2017-12-14 DIAGNOSIS — Z833 Family history of diabetes mellitus: Secondary | ICD-10-CM | POA: Diagnosis not present

## 2017-12-14 DIAGNOSIS — R269 Unspecified abnormalities of gait and mobility: Secondary | ICD-10-CM | POA: Insufficient documentation

## 2017-12-14 DIAGNOSIS — R569 Unspecified convulsions: Secondary | ICD-10-CM | POA: Diagnosis not present

## 2017-12-14 DIAGNOSIS — G4733 Obstructive sleep apnea (adult) (pediatric): Secondary | ICD-10-CM | POA: Insufficient documentation

## 2017-12-14 DIAGNOSIS — Z8673 Personal history of transient ischemic attack (TIA), and cerebral infarction without residual deficits: Secondary | ICD-10-CM | POA: Diagnosis not present

## 2017-12-14 DIAGNOSIS — Z6841 Body Mass Index (BMI) 40.0 and over, adult: Secondary | ICD-10-CM | POA: Diagnosis not present

## 2017-12-14 DIAGNOSIS — R5381 Other malaise: Secondary | ICD-10-CM | POA: Insufficient documentation

## 2017-12-14 DIAGNOSIS — E7800 Pure hypercholesterolemia, unspecified: Secondary | ICD-10-CM | POA: Insufficient documentation

## 2017-12-14 DIAGNOSIS — I130 Hypertensive heart and chronic kidney disease with heart failure and stage 1 through stage 4 chronic kidney disease, or unspecified chronic kidney disease: Secondary | ICD-10-CM | POA: Diagnosis not present

## 2017-12-14 DIAGNOSIS — E1122 Type 2 diabetes mellitus with diabetic chronic kidney disease: Secondary | ICD-10-CM | POA: Diagnosis not present

## 2017-12-14 DIAGNOSIS — Z7982 Long term (current) use of aspirin: Secondary | ICD-10-CM | POA: Insufficient documentation

## 2017-12-14 DIAGNOSIS — N183 Chronic kidney disease, stage 3 (moderate): Secondary | ICD-10-CM | POA: Insufficient documentation

## 2017-12-14 DIAGNOSIS — E039 Hypothyroidism, unspecified: Secondary | ICD-10-CM | POA: Diagnosis not present

## 2017-12-14 DIAGNOSIS — I10 Essential (primary) hypertension: Secondary | ICD-10-CM

## 2017-12-14 NOTE — Progress Notes (Signed)
Subjective:    Patient ID: Daniel [name], male    DOB: [birth year], 52 y.o.   MRN: [id number]  HPI 52 year old male with history of T2DM, morbid obesity, 3-4 weeks of hallucinations with progressive encephalopathy and fall presents for transitional care management after receiving CIR for debility.   Admit date: 11/22/2017 Discharge date: 12/02/2017  Wife supplements history. Pt states he fell at home and went to the ED and was told he bruised his leg. At discharge, pt was instructed to follow up with Neurology, which he has.  He saw his PCP, with plans to order lab work.  He has been checking his CBGs, which has been relatively controlled. BP is controlled. Sleep has improved. Continues to await medicaid.   DME: shower chair Therapies: 1/week Mobility: Walker at home, wheelchair in community  Pain Inventory Average Pain 2 Pain Right Now 2 My pain is constant  In the last 24 hours, has pain interfered with the following? General activity 3 Relation with others 5 Enjoyment of life 5 What TIME of day is your pain at its worst? evening Sleep (in general) Good  Pain is worse with: walking and standing Pain improves with: n/a Relief from Meds: none  Mobility use a wheelchair  Function disabled: date disabled n/a  Neuro/Psych numbness trouble walking  Prior Studies Any changes since last visit?  yes x-rays CT/MRI  Physicians involved in your care Any changes since last visit?  no Primary care [facility]   Family History  Problem Relation Age of Onset  . Glaucoma Mother   . Diabetes Maternal Grandmother   . Diabetes Maternal Grandfather   . Alpha-1 antitrypsin deficiency Neg Hx   . Asthma Neg Hx   . Breast cancer Neg Hx   . Stroke Neg Hx   . Colon cancer Neg Hx   . Coronary artery disease Neg Hx   . COPD Neg Hx   . Cystic fibrosis Neg Hx   . Deep vein thrombosis Neg Hx   . Hypertension Neg Hx   . Hyperlipidemia Neg Hx   . Kidney disease Neg Hx   .  Liver disease Neg Hx   . Lung cancer Neg Hx   . Lupus Neg Hx   . Neurofibromatosis Neg Hx   . Osteoporosis Neg Hx   . Ovarian cancer Neg Hx   . Positive PPD/TB Exposure Neg Hx   . Prostate cancer Neg Hx   . Pulmonary embolism Neg Hx   . Pulmonary fibrosis Neg Hx   . Rheum arthritis Neg Hx   . Sarcoidosis Neg Hx   . Sleep apnea Neg Hx   . Thyroid disease Neg Hx   . Tuberculosis Neg Hx   . Tuberous sclerosis Neg Hx    Social History   Socioeconomic History  . Marital status: Married    Spouse name: Wells Guiles   . Number of children: 4  . Years of education: 61  . Highest education level: Not on file  Occupational History  . Occupation: Disability   Social Needs  . Financial resource strain: Not on file  . Food insecurity:    Worry: Not on file    Inability: Not on file  . Transportation needs:    Medical: Not on file    Non-medical: Not on file  Tobacco Use  . Smoking status: Never Smoker  . Smokeless tobacco: Former Systems developer    Types: Chew  Substance and Sexual Activity  . Alcohol use: Not Currently  Frequency: Never  . Drug use: Never  . Sexual activity: Yes    Partners: Female    Comment: wife  Lifestyle  . Physical activity:    Days per week: Not on file    Minutes per session: Not on file  . Stress: Not on file  Relationships  . Social connections:    Talks on phone: Not on file    Gets together: Not on file    Attends religious service: Not on file    Active member of club or organization: Not on file    Attends meetings of clubs or organizations: Not on file    Relationship status: Not on file  Other Topics Concern  . Not on file  Social History Narrative  . Not on file   Past Surgical History:  Procedure Laterality Date  . CARDIAC CATHETERIZATION  12/2016   Past Medical History:  Diagnosis Date  . Arthritis    "elbows and wrists" (11/13/2017)  . CHF (congestive heart failure) ([place]) 08/2016  . CKD stage 3 due to type 2 diabetes mellitus ([place])  06/2016  . Diabetes mellitus type 2 in obese ([place])   . High cholesterol   . Hypertension   . Hypothyroidism   . Migraines   . Morbid obesity ([place])   . On home oxygen therapy    "3L; now only at night" (11/13/2017)  . OSA on CPAP   . Pneumonia 2017   "double pneumonia"  . Pneumonia 10/2017   "while in ICU" (11/13/2017)  . Pulmonary embolism ([place])   . Seizure ([place])    "10/24/2017-10/28/2017; couldn't get them stopped til medically induced coma. after they brought him out he started having more; can't find RX to stop them" (11/13/2017)  . Stroke [facility])    "scans showed several mini strokes in the past; nothing recent" (11/13/2017)   BP 109/67   Pulse 72   Ht 5\' 10"  (1.778 m)   Wt 285 lb (129.3 kg)   SpO2 96%   BMI 40.89 kg/m   Opioid Risk Score:   Fall Risk Score:  `1  Depression screen PHQ 2/9  Depression screen PHQ 2/9 12/14/2017  Decreased Interest 0  Down, Depressed, Hopeless 0  PHQ - 2 Score 0    Review of Systems  Constitutional: Negative.   HENT: Negative.   Eyes: Negative.   Respiratory: Negative.   Cardiovascular: Negative.   Gastrointestinal: Negative.   Endocrine: Negative.   Genitourinary: Negative.   Musculoskeletal: Positive for gait problem.  Skin: Negative.   Allergic/Immunologic: Negative.   Neurological: Positive for weakness.  Hematological: Negative.   Psychiatric/Behavioral: Negative.   All other systems reviewed and are negative.     Objective:   Physical Exam Constitutional: He appears well-developed.  Obese.  HENT: Normocephalic and atraumatic.  Eyes: EOM are normal.  No discharge.  Cardiovascular: RRR. No JVD. Respiratory: Effort normal and breath sounds normal.  GI: Soft. Bowel sounds are normal.  Musculoskeletal: He exhibits edema in lower extremities, improving.  He exhibits no tenderness.  Neurological: He is alert and oriented x3 (primed) Able to follow simple motor commands.  Motor:  LUE: 4+/5 proximal to distal, stable LLE: Hip  flexion, knee extension 4/5, ankle dorsiflexion 3+/5, stable Skin: Skin is warm and dry.  Psychiatric: Normal affect.  Normal behavior.     Assessment & Plan:  52 year old male with history of T2DM, morbid obesity, 3-4 weeks of hallucinations with progressive encephalopathy and fall presents for transitional care management after receiving  CIR for debility.   1. Weakness, limitation self-care secondary to debility.  Cont therapies  No driving  2. Pain Management: tylenol prn.   Cont meds  3.  T2DM:   Cont meds  Controlled at present  4.  HTN:   Cont meds  5. Chronic renal failure:   Follow up with PCP  Will need workup for proteinuria and hematuria   6.  Seizures:   Cont meds  Needs to follow up with Neurology  Needs Dilantin levels checked  7. Obesity hypoventilation syndrome:   Cont CPAP use at bedtime, has now adjusted  8. Gait abnormality  Cont therapies  Cont walker/wheelchair for safety   Meds reviewed Referrals reviewed All questions answered

## 2017-12-14 NOTE — Patient Instructions (Signed)
Please follow up with PCP regarding proteinuria and hematuria   Please follow up with Neurology regarding seizure meds and Dilantin levels

## 2018-01-22 ENCOUNTER — Encounter

## 2018-01-22 ENCOUNTER — Ambulatory Visit: Payer: Medicaid Other | Admitting: Diagnostic Neuroimaging

## 2018-01-22 ENCOUNTER — Telehealth: Payer: Self-pay | Admitting: *Deleted

## 2018-01-22 NOTE — Telephone Encounter (Signed)
Patient was no show for new patient appointment today. 

## 2018-01-23 ENCOUNTER — Encounter: Payer: Self-pay | Admitting: Diagnostic Neuroimaging

## 2018-02-16 ENCOUNTER — Encounter: Payer: Medicaid Other | Admitting: Physical Medicine & Rehabilitation

## 2018-04-12 ENCOUNTER — Encounter: Payer: Self-pay | Admitting: Neurology

## 2018-04-12 ENCOUNTER — Ambulatory Visit: Payer: Medicaid Other | Admitting: Neurology

## 2018-04-12 DIAGNOSIS — M21372 Foot drop, left foot: Secondary | ICD-10-CM | POA: Diagnosis not present

## 2018-04-12 HISTORY — DX: Foot drop, left foot: M21.372

## 2018-04-12 NOTE — Progress Notes (Signed)
Reason for visit: Seizures  Referring physician: [facility] [name] is a 53 y.o. male  History of present illness:  Daniel [name] is a 53 year old right-handed white male with a history of obesity, diabetes, and hypertension.  The patient went into the hospital around 27 October 2017 with onset of a seizure that occurred while at home.  The patient comes in today with his wife.  The patient was noted to have what sounds like a generalized seizure at home, this was a new onset seizure for him.  He was taken to the hospital and eventually required intubation for intractable seizure events that were associated with significantly elevated blood sugars and severe hypertension.  The patient claims that he had severe headaches for least 3 weeks prior to admission mainly in the right temporal area.  His blood sugars were out of control oftentimes spiking up to 500-600 for several weeks prior to the hospitalization.  The patient was placed on phenytoin, Vimpat, and Keppra during the admission.  He was felt to be in a delirium state.  EEG evaluation showed generalized slowing.  Eventually, the seizures came under control and the patient went to inpatient rehab for further management.  Upon discharge home, the patient found that he could not afford the Vimpat as it costs him about $1000 a month.  Over the last 2 months he has gone off of all of his antiepileptic medications, he is no longer taking Keppra, Dilantin, or Vimpat.  The patient takes gabapentin in low-dose for neuropathy pain.  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  He has bilateral meralgia paresthetica.  The patient currently denies any new numbness or weakness or balance changes, he does have generalized fatigue and deconditioning problems.  He is on disability, he is no longer working.  He has not reported any headaches or vision changes since being out of the hospital.  Past  Medical History:  Diagnosis Date  . Arthritis    "elbows and wrists" (11/13/2017)  . CHF (congestive heart failure) ([place]) 08/2016  . CKD stage 3 due to type 2 diabetes mellitus ([place]) 06/2016  . Diabetes mellitus type 2 in obese ([place])   . High cholesterol   . Hypertension   . Hypothyroidism   . Migraines   . Morbid obesity ([place])   . On home oxygen therapy    "3L; now only at night" (11/13/2017)  . OSA on CPAP   . Pneumonia 2017   "double pneumonia"  . Pneumonia 10/2017   "while in ICU" (11/13/2017)  . Pulmonary embolism ([place])   . Seizure ([place])    "10/24/2017-10/28/2017; couldn't get them stopped til medically induced coma. after they brought him out he started having more; can't find RX to stop them" (11/13/2017)  . Stroke [facility])    "scans showed several mini strokes in the past; nothing recent" (11/13/2017)    Past Surgical History:  Procedure Laterality Date  . CARDIAC CATHETERIZATION  12/2016    Family History  Problem Relation Age of Onset  . Glaucoma Mother   . Diabetes Maternal Grandmother   . Diabetes Maternal Grandfather   . Alpha-1 antitrypsin deficiency Neg Hx   . Asthma Neg Hx   . Breast cancer Neg Hx   . Stroke Neg Hx   . Colon cancer Neg Hx   . Coronary artery disease Neg Hx   . COPD Neg Hx   . Cystic fibrosis Neg Hx   .  Deep vein thrombosis Neg Hx   . Hypertension Neg Hx   . Hyperlipidemia Neg Hx   . Kidney disease Neg Hx   . Liver disease Neg Hx   . Lung cancer Neg Hx   . Lupus Neg Hx   . Neurofibromatosis Neg Hx   . Osteoporosis Neg Hx   . Ovarian cancer Neg Hx   . Positive PPD/TB Exposure Neg Hx   . Prostate cancer Neg Hx   . Pulmonary embolism Neg Hx   . Pulmonary fibrosis Neg Hx   . Rheum arthritis Neg Hx   . Sarcoidosis Neg Hx   . Sleep apnea Neg Hx   . Thyroid disease Neg Hx   . Tuberculosis Neg Hx   . Tuberous sclerosis Neg Hx     Social history:  reports that he has never smoked. He quit smokeless tobacco use about 4 years ago.  His  smokeless tobacco use included chew. He reports previous alcohol use. He reports that he does not use drugs.  Medications:  Prior to Admission medications   Medication Sig Start Date End Date Taking? Authorizing Provider  acetaminophen (TYLENOL) 325 MG tablet Take 1-2 tablets (325-650 mg total) by mouth every 4 (four) hours as needed for mild pain. 11/29/17   Love, Ivan Anchors, PA-C  albuterol (PROVENTIL HFA) 108 (90 Base) MCG/ACT inhaler Inhale 1 puff into the lungs every 6 (six) hours as needed for wheezing or shortness of breath.     [provider]  aspirin EC 81 MG tablet Take 81 mg by mouth daily.     [provider]  fluticasone furoate-vilanterol (BREO ELLIPTA) 200-25 MCG/INH AEPB Inhale 1 puff into the lungs daily.  05/05/17   [provider]  furosemide (LASIX) 20 MG tablet Take 1 tablet (20 mg total) by mouth daily. 12/02/17   Love, Ivan Anchors, PA-C  gabapentin (NEURONTIN) 100 MG capsule Take 1 capsule (100 mg total) by mouth 3 (three) times daily. 12/01/17   Love, Ivan Anchors, PA-C  hydrALAZINE (APRESOLINE) 100 MG tablet Take 1 tablet (100 mg total) by mouth 3 (three) times daily. 12/01/17   Love, Ivan Anchors, PA-C  insulin aspart (NOVOLOG) 100 UNIT/ML FlexPen Inject 5 Units into the skin 3 (three) times daily with meals. 12/01/17   Love, Ivan Anchors, PA-C  Insulin Glargine (LANTUS) 100 UNIT/ML Solostar Pen Use 10  units in morning and 30 units at bedtime 12/01/17   Love, Ivan Anchors, PA-C  Insulin Pen Needle (CAREFINE PEN NEEDLES) 32G X 6 MM MISC 1 application by Does not apply route 2 (two) times daily. 12/01/17   Love, Ivan Anchors, PA-C  lacosamide (VIMPAT) 200 MG TABS tablet Take 1 tablet (200 mg total) by mouth 2 (two) times daily. 12/01/17   Love, Ivan Anchors, PA-C  levETIRAcetam (KEPPRA) 1000 MG tablet Take 1 tablet (1,000 mg total) by mouth 2 (two) times daily. 12/01/17   Love, Ivan Anchors, PA-C  levothyroxine (SYNTHROID, LEVOTHROID) 50 MCG tablet Take 1 tablet (50 mcg total) by  mouth daily. 12/01/17   Love, Ivan Anchors, PA-C  Melatonin 3 MG TABS Take 0.5 tablets (1.5 mg total) by mouth at bedtime. 12/01/17   Love, Ivan Anchors, PA-C  Menthol-Methyl Salicylate (MUSCLE RUB) 10-15 % CREA Apply 1 application topically 4 (four) times daily. To bilateral legs 12/01/17   Love, Ivan Anchors, PA-C  metoprolol succinate (TOPROL-XL) 50 MG 24 hr tablet Take 1 tablet (50 mg total) by mouth daily. Take with or immediately following a meal.  12/02/17   Love, Ivan Anchors, PA-C  pantoprazole (PROTONIX) 40 MG tablet Take 1 tablet (40 mg total) by mouth daily. 12/02/17   Love, Ivan Anchors, PA-C  phenytoin (DILANTIN) 200 MG ER capsule Take 2 capsules (400 mg total) by mouth daily. 12/02/17   Love, Ivan Anchors, PA-C  phenytoin (DILANTIN) 300 MG ER capsule Take 1 capsule (300 mg total) by mouth at bedtime. 12/01/17   Love, Ivan Anchors, PA-C  polyethylene glycol (MIRALAX / GLYCOLAX) packet Take 17 g by mouth 2 (two) times daily. 12/01/17   Bary Leriche, PA-C      Allergies  Allergen Reactions  . Montelukast Other (See Comments)    Possibly cause headaches per spouse    ROS:  Out of a complete 14 system review of symptoms, the patient complains only of the following symptoms, and all other reviewed systems are negative.  Weakness  Blood pressure 108/68, pulse 87, height 5\' 10"  (1.778 m), weight 291 lb 5 oz (132.1 kg).  Physical Exam  General: The patient is alert and cooperative at the time of the examination.  The patient is markedly obese.  Eyes: Pupils are equal, round, and reactive to light. Discs are flat bilaterally.  Neck: The neck is supple, no carotid bruits are noted.  Respiratory: The respiratory examination is clear.  Cardiovascular: The cardiovascular examination reveals a regular rate and rhythm, no obvious murmurs or rubs are noted.  Skin: Extremities are without significant edema.  Neurologic Exam  Mental status: The patient is alert and oriented x 3 at the time of the  examination. The patient has apparent normal recent and remote memory, with an apparently normal attention span and concentration ability.  Cranial nerves: Facial symmetry is present. There is good sensation of the face to pinprick and soft touch bilaterally. The strength of the facial muscles and the muscles to head turning and shoulder shrug are normal bilaterally. Speech is well enunciated, no aphasia or dysarthria is noted. Extraocular movements are full. Visual fields are full. The tongue is midline, and the patient has symmetric elevation of the soft palate. No obvious hearing deficits are noted.  Motor: The motor testing reveals 5 over 5 strength of all 4 extremities, with exception of a left foot drop associated with eversion weakness, not inversion. Good symmetric motor tone is noted throughout.  Sensory: Sensory testing is intact to pinprick, soft touch, vibration sensation, and position sense on all 4 extremities, with exception some decreased vibration sensation in the left foot. No evidence of extinction is noted.  Coordination: Cerebellar testing reveals good finger-nose-finger and heel-to-shin bilaterally.  Gait and station: Gait is associated with a steppage gait pattern on the left leg. Tandem gait is slightly unsteady. Romberg is negative. No drift is seen.  Reflexes: Deep tendon reflexes are symmetric, but are decreased bilaterally. Toes are downgoing bilaterally.   CT head 11/06/17:  IMPRESSION: 1. No acute intracranial abnormality. 2. 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  * CT scan images were reviewed online. I agree with the written report.    Assessment/Plan:  1.  Symptomatic seizure, associated with hyperglycemia and hypertension  2.  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  Diabetes with diabetic neuropathy  4.  Bilateral meralgia paresthetica  The patient has done well coming off of the antiepileptic  medications.  I will not restart these medications at this time.  The patient will follow-up in 6 months, if he continues to do well  we will see him on an as-needed basis.  He has recently returned to driving.  Jill Alexanders MD 04/12/2018 10:43 AM  [facility] [address] [place], [place] [address]  Phone [phone] Fax [phone]

## 2018-09-12 ENCOUNTER — Telehealth: Payer: Self-pay | Admitting: Emergency Medicine

## 2018-09-12 NOTE — Telephone Encounter (Signed)
Returned call to patient and wife. Patient has not been on Breo 'in a good while' and was getting it through patient assistance with help from [facility].  Patient in uninsured and has not been seen in this office in one year.    In reviewing the chart, it looks like when Dr. Lamonte Sakai last saw the patient in July 2019, the patient was on Breo 100 and update looks like he is now on Breo 200.  This was confirmed by the patient and his wife.  Advised patient since finances are a concern with appointments and medications, they should contact the last prescribing physician at [facility] to see if they can refill med or provide samples, but most importantly to assist them with keeping the medication assistance for Breo active.  Wife states they received free meds for a year and it recently stopped so patient has not been on the [place].  Wife states she has info on last shipping box for [facility] and she will contact either the company or [facility] for assistance with the [facility] since the dosing has changed.  Offered follow up appointment if needed.  Nothing further needed at this time.

## 2018-10-11 ENCOUNTER — Ambulatory Visit: Payer: Medicaid Other | Admitting: Neurology

## 2019-08-18 ENCOUNTER — Emergency Department (HOSPITAL_COMMUNITY): Payer: Medicare Other

## 2019-08-18 ENCOUNTER — Encounter (HOSPITAL_COMMUNITY): Payer: Self-pay | Admitting: Emergency Medicine

## 2019-08-18 ENCOUNTER — Observation Stay (HOSPITAL_COMMUNITY)
Admission: EM | Admit: 2019-08-18 | Discharge: 2019-08-19 | Disposition: A | Discharge status: Home / Self Care | Payer: Medicare Other | Attending: Internal Medicine | Admitting: Internal Medicine

## 2019-08-18 ENCOUNTER — Other Ambulatory Visit: Payer: Self-pay

## 2019-08-18 DIAGNOSIS — Z9981 Dependence on supplemental oxygen: Secondary | ICD-10-CM | POA: Insufficient documentation

## 2019-08-18 DIAGNOSIS — E1165 Type 2 diabetes mellitus with hyperglycemia: Secondary | ICD-10-CM | POA: Diagnosis not present

## 2019-08-18 DIAGNOSIS — Z83511 Family history of glaucoma: Secondary | ICD-10-CM | POA: Insufficient documentation

## 2019-08-18 DIAGNOSIS — I130 Hypertensive heart and chronic kidney disease with heart failure and stage 1 through stage 4 chronic kidney disease, or unspecified chronic kidney disease: Secondary | ICD-10-CM | POA: Diagnosis not present

## 2019-08-18 DIAGNOSIS — E1122 Type 2 diabetes mellitus with diabetic chronic kidney disease: Secondary | ICD-10-CM | POA: Diagnosis not present

## 2019-08-18 DIAGNOSIS — I5041 Acute combined systolic (congestive) and diastolic (congestive) heart failure: Secondary | ICD-10-CM | POA: Diagnosis not present

## 2019-08-18 DIAGNOSIS — Z20822 Contact with and (suspected) exposure to covid-19: Secondary | ICD-10-CM | POA: Diagnosis not present

## 2019-08-18 DIAGNOSIS — G43909 Migraine, unspecified, not intractable, without status migrainosus: Secondary | ICD-10-CM | POA: Insufficient documentation

## 2019-08-18 DIAGNOSIS — Z8673 Personal history of transient ischemic attack (TIA), and cerebral infarction without residual deficits: Secondary | ICD-10-CM | POA: Insufficient documentation

## 2019-08-18 DIAGNOSIS — F329 Major depressive disorder, single episode, unspecified: Secondary | ICD-10-CM | POA: Diagnosis not present

## 2019-08-18 DIAGNOSIS — Z7982 Long term (current) use of aspirin: Secondary | ICD-10-CM | POA: Insufficient documentation

## 2019-08-18 DIAGNOSIS — N1832 Chronic kidney disease, stage 3b: Secondary | ICD-10-CM | POA: Diagnosis not present

## 2019-08-18 DIAGNOSIS — G40909 Epilepsy, unspecified, not intractable, without status epilepticus: Secondary | ICD-10-CM | POA: Insufficient documentation

## 2019-08-18 DIAGNOSIS — R413 Other amnesia: Secondary | ICD-10-CM | POA: Diagnosis present

## 2019-08-18 DIAGNOSIS — Z86711 Personal history of pulmonary embolism: Secondary | ICD-10-CM | POA: Diagnosis not present

## 2019-08-18 DIAGNOSIS — R2681 Unsteadiness on feet: Secondary | ICD-10-CM | POA: Insufficient documentation

## 2019-08-18 DIAGNOSIS — E66813 Obesity, class 3: Secondary | ICD-10-CM | POA: Diagnosis present

## 2019-08-18 DIAGNOSIS — R569 Unspecified convulsions: Secondary | ICD-10-CM

## 2019-08-18 DIAGNOSIS — E7800 Pure hypercholesterolemia, unspecified: Secondary | ICD-10-CM | POA: Diagnosis not present

## 2019-08-18 DIAGNOSIS — Z79899 Other long term (current) drug therapy: Secondary | ICD-10-CM | POA: Insufficient documentation

## 2019-08-18 DIAGNOSIS — E869 Volume depletion, unspecified: Secondary | ICD-10-CM | POA: Diagnosis present

## 2019-08-18 DIAGNOSIS — R739 Hyperglycemia, unspecified: Secondary | ICD-10-CM | POA: Diagnosis present

## 2019-08-18 DIAGNOSIS — G4733 Obstructive sleep apnea (adult) (pediatric): Secondary | ICD-10-CM | POA: Diagnosis not present

## 2019-08-18 DIAGNOSIS — E039 Hypothyroidism, unspecified: Secondary | ICD-10-CM | POA: Diagnosis not present

## 2019-08-18 DIAGNOSIS — E871 Hypo-osmolality and hyponatremia: Secondary | ICD-10-CM | POA: Diagnosis not present

## 2019-08-18 DIAGNOSIS — E785 Hyperlipidemia, unspecified: Secondary | ICD-10-CM | POA: Diagnosis present

## 2019-08-18 DIAGNOSIS — G9341 Metabolic encephalopathy: Secondary | ICD-10-CM | POA: Diagnosis not present

## 2019-08-18 DIAGNOSIS — I10 Essential (primary) hypertension: Secondary | ICD-10-CM | POA: Diagnosis present

## 2019-08-18 DIAGNOSIS — Z9119 Patient's noncompliance with other medical treatment and regimen: Secondary | ICD-10-CM | POA: Insufficient documentation

## 2019-08-18 DIAGNOSIS — Z833 Family history of diabetes mellitus: Secondary | ICD-10-CM | POA: Insufficient documentation

## 2019-08-18 DIAGNOSIS — Z7951 Long term (current) use of inhaled steroids: Secondary | ICD-10-CM | POA: Insufficient documentation

## 2019-08-18 DIAGNOSIS — M1990 Unspecified osteoarthritis, unspecified site: Secondary | ICD-10-CM | POA: Insufficient documentation

## 2019-08-18 DIAGNOSIS — N183 Chronic kidney disease, stage 3 unspecified: Secondary | ICD-10-CM | POA: Diagnosis present

## 2019-08-18 DIAGNOSIS — Z6841 Body Mass Index (BMI) 40.0 and over, adult: Secondary | ICD-10-CM | POA: Diagnosis not present

## 2019-08-18 DIAGNOSIS — Z794 Long term (current) use of insulin: Secondary | ICD-10-CM | POA: Insufficient documentation

## 2019-08-18 DIAGNOSIS — N179 Acute kidney failure, unspecified: Secondary | ICD-10-CM | POA: Diagnosis not present

## 2019-08-18 DIAGNOSIS — E119 Type 2 diabetes mellitus without complications: Secondary | ICD-10-CM

## 2019-08-18 LAB — BASIC METABOLIC PANEL
Anion gap: 10 (ref 5–15)
Anion gap: 9 (ref 5–15)
Anion gap: 9 (ref 5–15)
BUN: 35 mg/dL — ABNORMAL HIGH (ref 6–20)
BUN: 37 mg/dL — ABNORMAL HIGH (ref 6–20)
BUN: 37 mg/dL — ABNORMAL HIGH (ref 6–20)
CO2: 22 mmol/L (ref 22–32)
CO2: 22 mmol/L (ref 22–32)
CO2: 22 mmol/L (ref 22–32)
Calcium: 8 mg/dL — ABNORMAL LOW (ref 8.9–10.3)
Calcium: 7.9 mg/dL — ABNORMAL LOW (ref 8.9–10.3)
Calcium: 7.8 mg/dL — ABNORMAL LOW (ref 8.9–10.3)
Chloride: 97 mmol/L — ABNORMAL LOW (ref 98–111)
Chloride: 100 mmol/L (ref 98–111)
Chloride: 102 mmol/L (ref 98–111)
Creatinine, Ser: 2.09 mg/dL — ABNORMAL HIGH (ref 0.61–1.24)
Creatinine, Ser: 2.12 mg/dL — ABNORMAL HIGH (ref 0.61–1.24)
Creatinine, Ser: 2 mg/dL — ABNORMAL HIGH (ref 0.61–1.24)
GFR calc Af Amer: 40 mL/min — ABNORMAL LOW (ref >60)
GFR calc Af Amer: 39 mL/min — ABNORMAL LOW (ref >60)
GFR calc Af Amer: 42 mL/min — ABNORMAL LOW (ref >60)
GFR calc non Af Amer: 35 mL/min — ABNORMAL LOW (ref >60)
GFR calc non Af Amer: 34 mL/min — ABNORMAL LOW (ref >60)
GFR calc non Af Amer: 36 mL/min — ABNORMAL LOW (ref >60)
Glucose, Bld: 442 mg/dL — ABNORMAL HIGH (ref 70–99)
Glucose, Bld: 395 mg/dL — ABNORMAL HIGH (ref 70–99)
Glucose, Bld: 217 mg/dL — ABNORMAL HIGH (ref 70–99)
Potassium: 4.3 mmol/L (ref 3.5–5.1)
Potassium: 4 mmol/L (ref 3.5–5.1)
Potassium: 4.2 mmol/L (ref 3.5–5.1)
Sodium: 129 mmol/L — ABNORMAL LOW (ref 135–145)
Sodium: 131 mmol/L — ABNORMAL LOW (ref 135–145)
Sodium: 133 mmol/L — ABNORMAL LOW (ref 135–145)

## 2019-08-18 LAB — CBC
HCT: 57.1 % — ABNORMAL HIGH (ref 39.0–52.0)
HCT: 55.3 % — ABNORMAL HIGH (ref 39.0–52.0)
Hemoglobin: 19.4 g/dL — ABNORMAL HIGH (ref 13.0–17.0)
Hemoglobin: 18.4 g/dL — ABNORMAL HIGH (ref 13.0–17.0)
MCH: 27.7 pg (ref 26.0–34.0)
MCH: 27.6 pg (ref 26.0–34.0)
MCHC: 34 g/dL (ref 30.0–36.0)
MCHC: 33.3 g/dL (ref 30.0–36.0)
MCV: 81.5 fL (ref 80.0–100.0)
MCV: 82.9 fL (ref 80.0–100.0)
Platelets: 250 10*3/uL (ref 150–400)
Platelets: 267 10*3/uL (ref 150–400)
RBC: 7.01 MIL/uL — ABNORMAL HIGH (ref 4.22–5.81)
RBC: 6.67 MIL/uL — ABNORMAL HIGH (ref 4.22–5.81)
RDW: 14.1 % (ref 11.5–15.5)
RDW: 13.9 % (ref 11.5–15.5)
WBC: 10.4 10*3/uL (ref 4.0–10.5)
WBC: 11.6 10*3/uL — ABNORMAL HIGH (ref 4.0–10.5)
nRBC: 0 % (ref 0.0–0.2)
nRBC: 0 % (ref 0.0–0.2)

## 2019-08-18 LAB — COMPREHENSIVE METABOLIC PANEL
ALT: 13 U/L (ref 0–44)
AST: 11 U/L — ABNORMAL LOW (ref 15–41)
Albumin: 2.7 g/dL — ABNORMAL LOW (ref 3.5–5.0)
Alkaline Phosphatase: 84 U/L (ref 38–126)
Anion gap: 12 (ref 5–15)
BUN: 32 mg/dL — ABNORMAL HIGH (ref 6–20)
CO2: 20 mmol/L — ABNORMAL LOW (ref 22–32)
Calcium: 8.2 mg/dL — ABNORMAL LOW (ref 8.9–10.3)
Chloride: 93 mmol/L — ABNORMAL LOW (ref 98–111)
Creatinine, Ser: 2.06 mg/dL — ABNORMAL HIGH (ref 0.61–1.24)
GFR calc Af Amer: 41 mL/min — ABNORMAL LOW (ref >60)
GFR calc non Af Amer: 35 mL/min — ABNORMAL LOW (ref >60)
Glucose, Bld: 568 mg/dL (ref 70–99)
Potassium: 4.5 mmol/L (ref 3.5–5.1)
Sodium: 125 mmol/L — ABNORMAL LOW (ref 135–145)
Total Bilirubin: 0.9 mg/dL (ref 0.3–1.2)
Total Protein: 6.4 g/dL — ABNORMAL LOW (ref 6.5–8.1)

## 2019-08-18 LAB — PROTIME-INR
INR: 1 (ref 0.8–1.2)
Prothrombin Time: 13 seconds (ref 11.4–15.2)

## 2019-08-18 LAB — LIPID PANEL
Cholesterol: 311 mg/dL — ABNORMAL HIGH (ref 0–200)
HDL: 29 mg/dL — ABNORMAL LOW (ref >40)
LDL Cholesterol: 217 mg/dL — ABNORMAL HIGH (ref 0–99)
Total CHOL/HDL Ratio: 10.7 RATIO
Triglycerides: 324 mg/dL — ABNORMAL HIGH (ref <150)
VLDL: 65 mg/dL — ABNORMAL HIGH (ref 0–40)

## 2019-08-18 LAB — URINALYSIS, ROUTINE W REFLEX MICROSCOPIC
Bilirubin Urine: NEGATIVE
Glucose, UA: >=500 mg/dL — AB
Ketones, ur: NEGATIVE mg/dL
Leukocytes,Ua: NEGATIVE
Nitrite: NEGATIVE
Protein, ur: >=300 mg/dL — AB
Specific Gravity, Urine: 1.025 (ref 1.005–1.030)
pH: 5 (ref 5.0–8.0)

## 2019-08-18 LAB — RAPID URINE DRUG SCREEN, HOSP PERFORMED
Amphetamines: NOT DETECTED
Barbiturates: NOT DETECTED
Benzodiazepines: NOT DETECTED
Cocaine: NOT DETECTED
Opiates: NOT DETECTED
Tetrahydrocannabinol: NOT DETECTED

## 2019-08-18 LAB — FOLATE: Folate: 17.4 ng/mL (ref >5.9)

## 2019-08-18 LAB — CBG MONITORING, ED
Glucose-Capillary: >600 mg/dL (ref 70–99)
Glucose-Capillary: 498 mg/dL — ABNORMAL HIGH (ref 70–99)
Glucose-Capillary: 506 mg/dL (ref 70–99)
Glucose-Capillary: 411 mg/dL — ABNORMAL HIGH (ref 70–99)
Glucose-Capillary: 445 mg/dL — ABNORMAL HIGH (ref 70–99)
Glucose-Capillary: 299 mg/dL — ABNORMAL HIGH (ref 70–99)
Glucose-Capillary: 221 mg/dL — ABNORMAL HIGH (ref 70–99)
Glucose-Capillary: 169 mg/dL — ABNORMAL HIGH (ref 70–99)
Glucose-Capillary: 160 mg/dL — ABNORMAL HIGH (ref 70–99)
Glucose-Capillary: 143 mg/dL — ABNORMAL HIGH (ref 70–99)

## 2019-08-18 LAB — DIFFERENTIAL
Abs Immature Granulocytes: 0.12 10*3/uL — ABNORMAL HIGH (ref 0.00–0.07)
Basophils Absolute: 0 10*3/uL (ref 0.0–0.1)
Basophils Relative: 0 %
Eosinophils Absolute: 0 10*3/uL (ref 0.0–0.5)
Eosinophils Relative: 0 %
Immature Granulocytes: 1 %
Lymphocytes Relative: 17 %
Lymphs Abs: 1.8 10*3/uL (ref 0.7–4.0)
Monocytes Absolute: 0.7 10*3/uL (ref 0.1–1.0)
Monocytes Relative: 7 %
Neutro Abs: 7.8 10*3/uL — ABNORMAL HIGH (ref 1.7–7.7)
Neutrophils Relative %: 75 %

## 2019-08-18 LAB — AMMONIA: Ammonia: 11 umol/L (ref 9–35)

## 2019-08-18 LAB — BETA-HYDROXYBUTYRIC ACID: Beta-Hydroxybutyric Acid: 0.2 mmol/L (ref 0.05–0.27)

## 2019-08-18 LAB — OSMOLALITY: Osmolality: 309 mOsm/kg — ABNORMAL HIGH (ref 275–295)

## 2019-08-18 LAB — TSH: TSH: 2.702 u[IU]/mL (ref 0.350–4.500)

## 2019-08-18 LAB — GLUCOSE, CAPILLARY: Glucose-Capillary: 167 mg/dL — ABNORMAL HIGH (ref 70–99)

## 2019-08-18 LAB — SARS CORONAVIRUS 2 BY RT PCR (HOSPITAL ORDER, PERFORMED IN ~~LOC~~ HOSPITAL LAB): SARS Coronavirus 2: NEGATIVE

## 2019-08-18 LAB — ETHANOL: Alcohol, Ethyl (B): <10 mg/dL (ref <10)

## 2019-08-18 LAB — MAGNESIUM: Magnesium: 2 mg/dL (ref 1.7–2.4)

## 2019-08-18 LAB — HIV ANTIBODY (ROUTINE TESTING W REFLEX): HIV Screen 4th Generation wRfx: NONREACTIVE

## 2019-08-18 LAB — VITAMIN B12: Vitamin B-12: 219 pg/mL (ref 180–914)

## 2019-08-18 MED ORDER — ASPIRIN EC 81 MG PO TBEC
81.0000 mg | DELAYED_RELEASE_TABLET | Freq: Every day | ORAL | Status: DC
  Administered 2019-08-19: 81 mg via ORAL
  Filled 2019-08-18: qty 1

## 2019-08-18 MED ORDER — ENOXAPARIN SODIUM 40 MG/0.4ML ~~LOC~~ SOLN: 40.0000 mg | SUBCUTANEOUS | Status: DC

## 2019-08-18 MED ORDER — POTASSIUM CHLORIDE 10 MEQ/100ML IV SOLN
10.0000 meq | INTRAVENOUS | Status: AC
  Administered 2019-08-18: 10 meq via INTRAVENOUS

## 2019-08-18 MED ORDER — INSULIN REGULAR(HUMAN) IN NACL 100-0.9 UT/100ML-% IV SOLN
INTRAVENOUS | Status: DC
  Filled 2019-08-18: qty 100

## 2019-08-18 MED ORDER — INSULIN DETEMIR 100 UNIT/ML ~~LOC~~ SOLN
50.0000 [IU] | Freq: Two times a day (BID) | SUBCUTANEOUS | Status: DC
  Administered 2019-08-18 – 2019-08-19 (×2): 50 [IU] via SUBCUTANEOUS
  Filled 2019-08-18 (×3): qty 0.5

## 2019-08-18 MED ORDER — SODIUM CHLORIDE 0.9 % IV BOLUS
1000.0000 mL | Freq: Once | INTRAVENOUS | Status: AC
  Administered 2019-08-18: 1000 mL via INTRAVENOUS

## 2019-08-18 MED ORDER — SODIUM CHLORIDE 0.9 % IV BOLUS
500.0000 mL | Freq: Once | INTRAVENOUS | Status: AC
  Administered 2019-08-18: 500 mL via INTRAVENOUS

## 2019-08-18 MED ORDER — ALBUTEROL SULFATE (2.5 MG/3ML) 0.083% IN NEBU: 2.5000 mg | INHALATION_SOLUTION | Freq: Four times a day (QID) | RESPIRATORY_TRACT | Status: DC | PRN

## 2019-08-18 MED ORDER — ALBUTEROL SULFATE HFA 108 (90 BASE) MCG/ACT IN AERS
1.0000 | INHALATION_SPRAY | Freq: Four times a day (QID) | RESPIRATORY_TRACT | Status: DC | PRN
  Filled 2019-08-18: qty 6.7

## 2019-08-18 MED ORDER — HYDRALAZINE HCL 20 MG/ML IJ SOLN: 10.0000 mg | Freq: Four times a day (QID) | INTRAMUSCULAR | Status: DC | PRN

## 2019-08-18 MED ORDER — SODIUM CHLORIDE 0.9% FLUSH
3.0000 mL | Freq: Once | INTRAVENOUS | Status: DC
Start: 2019-08-18 — End: 2019-08-19

## 2019-08-18 MED ORDER — INSULIN REGULAR(HUMAN) IN NACL 100-0.9 UT/100ML-% IV SOLN
INTRAVENOUS | Status: DC
  Administered 2019-08-18: 13 [IU]/h via INTRAVENOUS

## 2019-08-18 MED ORDER — LEVOTHYROXINE SODIUM 50 MCG PO TABS: 50.0000 ug | ORAL_TABLET | Freq: Every day | ORAL | Status: DC

## 2019-08-18 MED ORDER — CITALOPRAM HYDROBROMIDE 20 MG PO TABS
20.0000 mg | ORAL_TABLET | Freq: Every day | ORAL | Status: DC
  Administered 2019-08-19: 20 mg via ORAL
  Filled 2019-08-18: qty 1

## 2019-08-18 MED ORDER — DEXTROSE 50 % IV SOLN: 0.0000 mL | INTRAVENOUS | Status: DC | PRN

## 2019-08-18 MED ORDER — SODIUM CHLORIDE 0.9 % IV SOLN: INTRAVENOUS | Status: DC

## 2019-08-18 MED ORDER — LEVOTHYROXINE SODIUM 50 MCG PO TABS
50.0000 ug | ORAL_TABLET | Freq: Every day | ORAL | Status: DC
  Administered 2019-08-19: 50 ug via ORAL
  Filled 2019-08-18: qty 1

## 2019-08-18 MED ORDER — DEXTROSE-NACL 5-0.45 % IV SOLN: INTRAVENOUS | Status: DC

## 2019-08-18 MED ORDER — INSULIN ASPART 100 UNIT/ML ~~LOC~~ SOLN
0.0000 [IU] | Freq: Three times a day (TID) | SUBCUTANEOUS | Status: DC
  Administered 2019-08-19 (×2): 3 [IU] via SUBCUTANEOUS

## 2019-08-18 NOTE — H&P (Addendum)
History and Physical    ANDRELL BERGESON QQV:[id number] DOB: Jul 01, 1965 DOA: 08/18/2019  PCP: Antonietta Jewel, MD  Patient coming from: Home I have personally briefly reviewed patient's old medical records in [place]  Chief Complaint: Headache, memory issues/concern for TIA  HPI: DAL BLEW is a 54 y.o. male with medical history significant of hypertension, insulin-dependent type 2 diabetes mellitus, hypothyroidism, depression, chronic bilateral lower extremity swelling, morbid obesity presents to emergency department with concern of short-term memory loss/TIA.  Patient's wife at bedside is the historian.  She tells me that patient has problem with his memory.  He asks same questions again and again.  He talks which does not make any sense.  He has been tearful over the dog's that died 7 years ago.  Also complaining of headache left-sided 2 days ago.  She tells me that patient had similar headache when he had a TIA last year.  She was concerned and brought patient to the emergency department for further evaluation and management.  He takes Levemir 60 units twice daily at home.  Reports being compliant with insulin and other meds however patient's wife tells me that insulin is not helping his sugar at all.  Patient was admitted in October 2019 with confusion and headache and was found to be in hypertensive urgency and developed seizures in the ED-he was discharged on Vimpat, Dilantin and Keppra.  He is currently not on AED medicine.  Reports chills and nausea however no history of head trauma, seizures, loss of consciousness, blurry vision, chest pain, shortness of breath, palpitation, leg swelling, abdominal pain, vomiting, diarrhea, decreased appetite, weakness or lethargy.  He lives with his wife at home.  He is compliant with CPAP at bedtime.  No history of smoking, alcohol, illicit drug use.  ED Course: Upon arrival to ED: Patient's blood pressure was 176/124, heart rate in 50s,  afebrile with no leukocytosis, blood glucose noted to be more than 600, sodium 125, CMP shows worsening kidney function.  COVID-19 pending.  CT head without contrast came back negative for acute findings.  Patient received IV fluids in ED and started on insulin drip.  [facility] hospitalist consulted for admission for hyperglycemia.  Review of Systems: As per HPI otherwise negative.    Past Medical History:  Diagnosis Date  . Arthritis    "elbows and wrists" (11/13/2017)  . CHF (congestive heart failure) ([place]) 08/2016  . CKD stage 3 due to type 2 diabetes mellitus ([place]) 06/2016  . Diabetes mellitus type 2 in obese ([place])   . High cholesterol   . Hypertension   . Hypothyroidism   . Left foot drop 04/12/2018  . Migraines   . Morbid obesity ([place])   . On home oxygen therapy    "3L; now only at night" (11/13/2017)  . OSA on CPAP   . Pneumonia 2017   "double pneumonia"  . Pneumonia 10/2017   "while in ICU" (11/13/2017)  . Pulmonary embolism ([place])   . Seizure ([place])    "10/24/2017-10/28/2017; couldn't get them stopped til medically induced coma. after they brought him out he started having more; can't find RX to stop them" (11/13/2017)  . Stroke [facility])    "scans showed several mini strokes in the past; nothing recent" (11/13/2017)    Past Surgical History:  Procedure Laterality Date  . CARDIAC CATHETERIZATION  12/2016     reports that he has never smoked. He quit smokeless tobacco use about 6 years ago.  His smokeless tobacco use  included chew. He reports previous alcohol use. He reports that he does not use drugs.  Allergies  Allergen Reactions  . Montelukast Other (See Comments)    Possibly cause headaches per spouse    Family History  Problem Relation Age of Onset  . Glaucoma Mother   . Diabetes Maternal Grandmother   . Diabetes Maternal Grandfather   . Alpha-1 antitrypsin deficiency Neg Hx   . Asthma Neg Hx   . Breast cancer Neg Hx   . Stroke Neg Hx   . Colon cancer Neg Hx   .  Coronary artery disease Neg Hx   . COPD Neg Hx   . Cystic fibrosis Neg Hx   . Deep vein thrombosis Neg Hx   . Hypertension Neg Hx   . Hyperlipidemia Neg Hx   . Kidney disease Neg Hx   . Liver disease Neg Hx   . Lung cancer Neg Hx   . Lupus Neg Hx   . Neurofibromatosis Neg Hx   . Osteoporosis Neg Hx   . Ovarian cancer Neg Hx   . Positive PPD/TB Exposure Neg Hx   . Prostate cancer Neg Hx   . Pulmonary embolism Neg Hx   . Pulmonary fibrosis Neg Hx   . Rheum arthritis Neg Hx   . Sarcoidosis Neg Hx   . Sleep apnea Neg Hx   . Thyroid disease Neg Hx   . Tuberculosis Neg Hx   . Tuberous sclerosis Neg Hx     Prior to Admission medications   Medication Sig Start Date End Date Taking? Authorizing Provider  acetaminophen (TYLENOL) 325 MG tablet Take 1-2 tablets (325-650 mg total) by mouth every 4 (four) hours as needed for mild pain. 11/29/17  Yes Love, Ivan Anchors, PA-C  albuterol (PROVENTIL HFA) 108 (90 Base) MCG/ACT inhaler Inhale 1 puff into the lungs every 6 (six) hours as needed for wheezing or shortness of breath.    Yes [provider]  aspirin EC 81 MG tablet Take 81 mg by mouth daily.    Yes [provider]  citalopram (CELEXA) 20 MG tablet Take 20 mg by mouth daily.   Yes [provider]  furosemide (LASIX) 20 MG tablet Take 1 tablet (20 mg total) by mouth daily. 12/02/17  Yes Love, Ivan Anchors, PA-C  insulin detemir (LEVEMIR) 100 UNIT/ML injection Inject 60 Units into the skin 2 (two) times daily.   Yes [provider]  levothyroxine (SYNTHROID, LEVOTHROID) 50 MCG tablet Take 1 tablet (50 mcg total) by mouth daily. 12/01/17  Yes Love, Ivan Anchors, PA-C  lisinopril (ZESTRIL) 20 MG tablet Take 20 mg by mouth daily.   Yes [provider]  fluticasone furoate-vilanterol (BREO ELLIPTA) 200-25 MCG/INH AEPB Inhale 1 puff into the lungs daily.  Patient not taking: Reported on 08/18/2019 05/05/17   [provider]  gabapentin (NEURONTIN) 100 MG  capsule Take 1 capsule (100 mg total) by mouth 3 (three) times daily. 12/01/17   Love, Ivan Anchors, PA-C  hydrALAZINE (APRESOLINE) 100 MG tablet Take 1 tablet (100 mg total) by mouth 3 (three) times daily. Patient not taking: Reported on 08/18/2019 12/01/17   Love, Ivan Anchors, PA-C  insulin aspart (NOVOLOG) 100 UNIT/ML FlexPen Inject 5 Units into the skin 3 (three) times daily with meals. Patient not taking: Reported on 08/18/2019 12/01/17   Love, Ivan Anchors, PA-C  Insulin Glargine (LANTUS) 100 UNIT/ML Solostar Pen Use 10  units in morning and 30 units at bedtime Patient not taking: Reported on 08/18/2019  12/01/17   Love, Ivan Anchors, PA-C  Insulin Pen Needle (CAREFINE PEN NEEDLES) 32G X 6 MM MISC 1 application by Does not apply route 2 (two) times daily. 12/01/17   Love, Ivan Anchors, PA-C  Melatonin 3 MG TABS Take 0.5 tablets (1.5 mg total) by mouth at bedtime. Patient not taking: Reported on 08/18/2019 12/01/17   Love, Ivan Anchors, PA-C  Menthol-Methyl Salicylate (MUSCLE RUB) 10-15 % CREA Apply 1 application topically 4 (four) times daily. To bilateral legs Patient not taking: Reported on 08/18/2019 12/01/17   Love, Ivan Anchors, PA-C  metoprolol succinate (TOPROL-XL) 50 MG 24 hr tablet Take 1 tablet (50 mg total) by mouth daily. Take with or immediately following a meal. Patient not taking: Reported on 08/18/2019 12/02/17   Love, Ivan Anchors, PA-C  pantoprazole (PROTONIX) 40 MG tablet Take 1 tablet (40 mg total) by mouth daily. Patient not taking: Reported on 08/18/2019 12/02/17   Love, Ivan Anchors, PA-C  polyethylene glycol [facility] / GLYCOLAX) packet Take 17 g by mouth 2 (two) times daily. Patient not taking: Reported on 08/18/2019 12/01/17   Flora Lipps    Physical Exam: Vitals:   08/18/19 1129 08/18/19 1159 08/18/19 1232 08/18/19 1302  BP: (!) 171/85 (!) 139/94 (!) 144/78 (!) 141/73  Pulse: 61 (!) 59 (!) 58 (!) 57  Resp: 18 (!) 22 18 11   Temp:      TempSrc:      SpO2:      Weight:         Constitutional: NAD, calm, comfortable, following commands, alert, oriented to place and person.  Morbidly obese Eyes: PERRL, lids and conjunctivae normal ENMT: Mucous membranes are moist. Posterior pharynx clear of any exudate or lesions.Normal dentition.  Neck: normal, supple, no masses, no thyromegaly Respiratory: clear to auscultation bilaterally, no wheezing, no crackles. Normal respiratory effort. No accessory muscle use.  Cardiovascular: Regular rate and rhythm, no murmurs / rubs / gallops. No extremity edema. 2+ pedal pulses. No carotid bruits.  Abdomen: no tenderness, no masses palpated. No hepatosplenomegaly. Bowel sounds positive.  Musculoskeletal: no clubbing / cyanosis. No joint deformity upper and lower extremities. Good ROM, no contractures. Normal muscle tone.  Skin: no rashes, lesions, ulcers. No induration Neurologic: CN 2-12 grossly intact. Sensation intact, DTR normal. Strength 5/5 in all 4.    Labs on Admission: I have personally reviewed following labs and imaging studies  CBC: Recent Labs  Lab 08/18/19 0820  WBC 10.4  NEUTROABS 7.8*  HGB 19.4*  HCT 57.1*  MCV 81.5  PLT 150   Basic Metabolic Panel: Recent Labs  Lab 08/18/19 0820  NA 125*  K 4.5  CL 93*  CO2 20*  GLUCOSE 568*  BUN 32*  CREATININE 2.06*  CALCIUM 8.2*   GFR: CrCl cannot be calculated (Unknown ideal weight.). Liver Function Tests: Recent Labs  Lab 08/18/19 0820  AST 11*  ALT 13  ALKPHOS 84  BILITOT 0.9  PROT 6.4*  ALBUMIN 2.7*   No results for input(s): LIPASE, AMYLASE in the last 168 hours. No results for input(s): AMMONIA in the last 168 hours. Coagulation Profile: Recent Labs  Lab 08/18/19 0924  INR 1.0   Cardiac Enzymes: No results for input(s): CKTOTAL, CKMB, CKMBINDEX, TROPONINI in the last 168 hours. BNP (last 3 results) No results for input(s): PROBNP in the last 8760 hours. HbA1C: No results for input(s): HGBA1C in the last 72 hours. CBG: Recent Labs   Lab 08/18/19 0818 08/18/19 1239  GLUCAP >600* 498*  Lipid Profile: No results for input(s): CHOL, HDL, LDLCALC, TRIG, CHOLHDL, LDLDIRECT in the last 72 hours. Thyroid Function Tests: No results for input(s): TSH, T4TOTAL, FREET4, T3FREE, THYROIDAB in the last 72 hours. Anemia Panel: No results for input(s): VITAMINB12, FOLATE, FERRITIN, TIBC, IRON, RETICCTPCT in the last 72 hours. Urine analysis:    Component Value Date/Time   COLORURINE YELLOW 11/07/2017 1809   APPEARANCEUR HAZY (A) 11/07/2017 1809   LABSPEC 1.020 11/07/2017 1809   PHURINE 5.0 11/07/2017 1809   GLUCOSEU 50 (A) 11/07/2017 1809   HGBUR MODERATE (A) 11/07/2017 1809   BILIRUBINUR NEGATIVE 11/07/2017 1809   KETONESUR NEGATIVE 11/07/2017 1809   PROTEINUR 100 (A) 11/07/2017 1809   NITRITE NEGATIVE 11/07/2017 1809   LEUKOCYTESUR NEGATIVE 11/07/2017 1809    Radiological Exams on Admission: CT HEAD WO CONTRAST  Result Date: 08/18/2019 CLINICAL DATA:  Confusion, headache, unsteady gait, question stroke EXAM: CT HEAD WITHOUT CONTRAST TECHNIQUE: Contiguous axial images were obtained from the base of the skull through the vertex without intravenous contrast. Sagittal and coronal MPR images reconstructed from axial data set. COMPARISON:  12/09/2017 FINDINGS: Brain: Generalized atrophy. Normal ventricular morphology. No midline shift or mass effect. Tiny old lacunar infarct inferior LEFT cerebellar hemisphere. Otherwise normal appearance of brain parenchyma. No intracranial hemorrhage, mass lesion, or evidence of acute infarction. No extra-axial fluid collections. Vascular: Minimal atherosclerotic calcification of internal carotid arteries at skull base. No hyperdense vessels. Skull: Normal appearance Sinuses/Orbits: Clear Other: N/A IMPRESSION: Generalized atrophy. Tiny old lacunar infarct LEFT cerebellar hemisphere inferiorly. No acute intracranial abnormalities. Electronically Signed   By: Lavonia Dana M.D.   On: 08/18/2019 09:59     EKG: Independently reviewed.  Normal sinus rhythm, no ST elevation or depression noted.  Assessment/Plan Principal Problem:   Hyperglycemia Active Problems:   Hypertension   Hyperlipidemia   OSA (obstructive sleep apnea)   Acute on chronic kidney failure (HCC)   Morbid obesity (HCC)   Seizures ([place])   Hyponatremia   Hyperglycemia: -Patient presented with blood sugar more than 600.  Bicarb: 20 anion gap: 12 -UA negative for infection.  CT head: Negative for acute findings. -Patient is afebrile with no leukocytosis. -Patient received IV fluid and started on insulin drip in ED. -Admit patient to stepdown unit for close monitoring.  Continue insulin drip. -Monitor blood sugar closely.  BMP every 4 hours -Start on normal saline when blood sugar is more than 250, change to D5 half-normal saline when blood sugar is less than 250.  Monitor vitals closely. -Check serum osmolality and beta hydroxybutyric acid.  Acute metabolic encephalopathy: -Could be secondary to uncontrolled blood sugar & electrolyte imbalance-sodium of 125 (corrected sodium with hyperglycemia is 133)  -CT head is negative.  No neuro deficit noted on exam. -Check B12, folate, TSH, ammonia, ethanol, UDS -Neurochecks every 4 hours -Consult PT/OT/SLP  Hemoconcentration: Likely secondary to dehydration -Repeat CBC tomorrow a.m.  Hypertension: Blood pressure elevated upon arrival.  Improving slowly -On lisinopril at home.  Hold for now due to AKI -Hydralazine as needed.  Monitor blood pressure closely.  Resume lisinopril once kidney function back to baseline.  Hyponatremia: Likely pseudohyponatremia in the setting of hyperglycemia -Corrected sodium is 133. -Continue IV fluids.  Repeat BMP tomorrow a.m.  AKI on CKD stage IIA: -Creatinine: 2.06, GFR: 35 (baseline creatinine: 1.33, GFR: 60) -Continue IV fluid.  Avoid nephrotoxic medication.  Repeat CMP tomorrow a.m.  Peripheral edema: -Hold Lasix for now due to  AKI.  Compression stockings and leg elevation  Hypothyroidism:  Check TSH -Continue levothyroxine  Obstructive sleep apnea on CPAP -We will continue to CPAP while patient is hospitalized  DVT prophylaxis: Lovenox/SCD Code Status: Full code Family Communication: Patient's wife present at bedside.  Plan of care discussed with patient and his wife at bedside in length and they verbalized understanding and agreed with it. Disposition Plan: Home in 2 days Consults called: None Admission status: Inpatient   Mckinley Jewel MD [facility]  If 7PM-7AM, please contact night-coverage [email or web address] Password [email or web address]  08/18/2019, 1:46 PM

## 2019-08-18 NOTE — ED Provider Notes (Signed)
[facility] EMERGENCY DEPARTMENT Provider Note   CSN: [id number] Arrival date & time: 08/18/19  0750     History Chief Complaint  Patient presents with  . Headache  . Memory Loss    Daniel [name] is a 54 y.o. male.  HPI 54 yo male ho ckd, t2dm, hypertension co 1 week of short term memory lossl  Wife states that over the past week of not temembering things.  She states each day that he has been tearful over the dogs that died 7 years ago.  She also describes short term memory loss of repeat questioning.  Began co headache lef tside of head 1.5 days ago.  Ho bpad with ? Depression.  She states generalized weakness.  States never been good with dates but worse now.  BS running high.  Eating and drinking ok.  No fever, but some chills.  Dr Deforest Hoyles   Past Medical History:  Diagnosis Date  . Arthritis    "elbows and wrists" (11/13/2017)  . CHF (congestive heart failure) ([place]) 08/2016  . CKD stage 3 due to type 2 diabetes mellitus ([place]) 06/2016  . Diabetes mellitus type 2 in obese ([place])   . High cholesterol   . Hypertension   . Hypothyroidism   . Left foot drop 04/12/2018  . Migraines   . Morbid obesity ([place])   . On home oxygen therapy    "3L; now only at night" (11/13/2017)  . OSA on CPAP   . Pneumonia 2017   "double pneumonia"  . Pneumonia 10/2017   "while in ICU" (11/13/2017)  . Pulmonary embolism ([place])   . Seizure ([place])    "10/24/2017-10/28/2017; couldn't get them stopped til medically induced coma. after they brought him out he started having more; can't find RX to stop them" (11/13/2017)  . Stroke [facility])    "scans showed several mini strokes in the past; nothing recent" (11/13/2017)    Patient Active Problem List   Diagnosis Date Noted  . Left foot drop 04/12/2018  . Abnormality of gait 12/14/2017  . Labile blood glucose   . Sleep disturbance   . Peripheral edema   . Stage 2 chronic kidney disease   . Debility 11/22/2017  . Morbid obesity ([place])   .  Seizures ([place])   . OSA (obstructive sleep apnea)   . Noncompliance   . FUO (fever of unknown origin)   . AKI (acute kidney injury) ([place])   . Diabetes mellitus type 2 in nonobese ([facility])   . Diabetes mellitus type 2 in obese ([place])   . Acute blood loss anemia   . Labile blood pressure   . Fever   . Status epilepticus ([place])   . Recurrent seizures ([place]) 10/27/2017  . Altered sensorium 10/27/2017  . Dyspnea on exertion 08/14/2017  . Chronic respiratory failure with hypoxia ([place]) 08/14/2017  . Sleep apnea 08/14/2017  . Obesity hypoventilation syndrome ([place]) 08/14/2017  . Fatigue 08/14/2017  . Hypertension 08/14/2017  . Hyperlipidemia 12/23/2016  . Hypothyroid 12/23/2016  . Acute combined systolic and diastolic congestive heart failure ([place]) 06/24/2016  . CKD (chronic kidney disease), stage III 06/24/2016  . Type 2 diabetes mellitus without complications ([place]) 62/26/3335    Past Surgical History:  Procedure Laterality Date  . CARDIAC CATHETERIZATION  12/2016       Family History  Problem Relation Age of Onset  . Glaucoma Mother   . Diabetes Maternal Grandmother   . Diabetes Maternal Grandfather   . Alpha-1 antitrypsin deficiency  Neg Hx   . Asthma Neg Hx   . Breast cancer Neg Hx   . Stroke Neg Hx   . Colon cancer Neg Hx   . Coronary artery disease Neg Hx   . COPD Neg Hx   . Cystic fibrosis Neg Hx   . Deep vein thrombosis Neg Hx   . Hypertension Neg Hx   . Hyperlipidemia Neg Hx   . Kidney disease Neg Hx   . Liver disease Neg Hx   . Lung cancer Neg Hx   . Lupus Neg Hx   . Neurofibromatosis Neg Hx   . Osteoporosis Neg Hx   . Ovarian cancer Neg Hx   . Positive PPD/TB Exposure Neg Hx   . Prostate cancer Neg Hx   . Pulmonary embolism Neg Hx   . Pulmonary fibrosis Neg Hx   . Rheum arthritis Neg Hx   . Sarcoidosis Neg Hx   . Sleep apnea Neg Hx   . Thyroid disease Neg Hx   . Tuberculosis Neg Hx   . Tuberous sclerosis Neg Hx     Social History   Tobacco Use  .  Smoking status: Never Smoker  . Smokeless tobacco: Former Systems developer    Types: Secondary school teacher  . Vaping Use: Never used  Substance Use Topics  . Alcohol use: Not Currently  . Drug use: Never    Home Medications Prior to Admission medications   Medication Sig Start Date End Date Taking? Authorizing Provider  acetaminophen (TYLENOL) 325 MG tablet Take 1-2 tablets (325-650 mg total) by mouth every 4 (four) hours as needed for mild pain. 11/29/17   Love, Ivan Anchors, PA-C  albuterol (PROVENTIL HFA) 108 (90 Base) MCG/ACT inhaler Inhale 1 puff into the lungs every 6 (six) hours as needed for wheezing or shortness of breath.     [provider]  aspirin EC 81 MG tablet Take 81 mg by mouth daily.     [provider]  fluticasone furoate-vilanterol (BREO ELLIPTA) 200-25 MCG/INH AEPB Inhale 1 puff into the lungs daily.  05/05/17   [provider]  furosemide (LASIX) 20 MG tablet Take 1 tablet (20 mg total) by mouth daily. 12/02/17   Love, Ivan Anchors, PA-C  gabapentin (NEURONTIN) 100 MG capsule Take 1 capsule (100 mg total) by mouth 3 (three) times daily. 12/01/17   Love, Ivan Anchors, PA-C  hydrALAZINE (APRESOLINE) 100 MG tablet Take 1 tablet (100 mg total) by mouth 3 (three) times daily. 12/01/17   Love, Ivan Anchors, PA-C  insulin aspart (NOVOLOG) 100 UNIT/ML FlexPen Inject 5 Units into the skin 3 (three) times daily with meals. 12/01/17   Love, Ivan Anchors, PA-C  Insulin Glargine (LANTUS) 100 UNIT/ML Solostar Pen Use 10  units in morning and 30 units at bedtime 12/01/17   Love, Ivan Anchors, PA-C  Insulin Pen Needle (CAREFINE PEN NEEDLES) 32G X 6 MM MISC 1 application by Does not apply route 2 (two) times daily. 12/01/17   Love, Ivan Anchors, PA-C  levothyroxine (SYNTHROID, LEVOTHROID) 50 MCG tablet Take 1 tablet (50 mcg total) by mouth daily. 12/01/17   Love, Ivan Anchors, PA-C  Melatonin 3 MG TABS Take 0.5 tablets (1.5 mg total) by mouth at bedtime. 12/01/17   Love, Ivan Anchors, PA-C  Menthol-Methyl  Salicylate (MUSCLE RUB) 10-15 % CREA Apply 1 application topically 4 (four) times daily. To bilateral legs 12/01/17   Love, Ivan Anchors, PA-C  metoprolol succinate (TOPROL-XL) 50 MG 24 hr tablet Take 1 tablet (50  mg total) by mouth daily. Take with or immediately following a meal. 12/02/17   Love, Ivan Anchors, PA-C  pantoprazole (PROTONIX) 40 MG tablet Take 1 tablet (40 mg total) by mouth daily. 12/02/17   Love, Ivan Anchors, PA-C  polyethylene glycol (MIRALAX / GLYCOLAX) packet Take 17 g by mouth 2 (two) times daily. 12/01/17   Love, Ivan Anchors, PA-C    Allergies    Montelukast  Review of Systems   Review of Systems  Constitutional: Positive for chills. Negative for appetite change and fever.  HENT: Positive for congestion.   Eyes: Negative.   Respiratory: Positive for cough.   Cardiovascular: Negative for chest pain.  Gastrointestinal: Negative.   Endocrine: Negative.   Genitourinary: Negative.   Musculoskeletal: Negative.   Skin: Negative.   Neurological: Positive for headaches.  All other systems reviewed and are negative.   Physical Exam Updated Vital Signs BP (!) 161/100   Pulse (!) 59   Temp 99.8 F (37.7 C) (Oral)   Resp 16   Wt (!) 140.6 kg   SpO2 96%   BMI 44.48 kg/m   Physical Exam Vitals and nursing note reviewed.  Constitutional:      General: He is not in acute distress.    Appearance: He is well-developed. He is obese. He is not ill-appearing.  HENT:     Head: Normocephalic and atraumatic.     Mouth/Throat:     Comments: Dry mm Eyes:     Extraocular Movements: Extraocular movements intact.     Pupils: Pupils are equal, round, and reactive to light.  Cardiovascular:     Rate and Rhythm: Normal rate and regular rhythm.     Heart sounds: Normal heart sounds.  Pulmonary:     Effort: Pulmonary effort is normal.  Musculoskeletal:        General: Normal range of motion.     Cervical back: Normal range of motion.  Skin:    General: Skin is warm.  Neurological:      Mental Status: He is alert.     Cranial Nerves: No cranial nerve deficit, dysarthria or facial asymmetry.     Sensory: No sensory deficit.     Motor: Weakness present.     Deep Tendon Reflexes: Reflexes normal.     Comments: Right palmar drift  Psychiatric:        Mood and Affect: Mood normal.     ED Results / Procedures / Treatments   Labs (all labs ordered are listed, but only abnormal results are displayed) Labs Reviewed  CBC - Abnormal; Notable for the following components:      Result Value   RBC 7.01 (*)    Hemoglobin 19.4 (*)    HCT 57.1 (*)    All other components within normal limits  DIFFERENTIAL - Abnormal; Notable for the following components:   Neutro Abs 7.8 (*)    Abs Immature Granulocytes 0.12 (*)    All other components within normal limits  COMPREHENSIVE METABOLIC PANEL - Abnormal; Notable for the following components:   Sodium 125 (*)    Chloride 93 (*)    CO2 20 (*)    Glucose, Bld 568 (*)    BUN 32 (*)    Creatinine, Ser 2.06 (*)    Calcium 8.2 (*)    Total Protein 6.4 (*)    Albumin 2.7 (*)    AST 11 (*)    GFR calc non Af Amer 35 (*)    GFR calc Af Wyvonnia Lora  41 (*)    All other components within normal limits  CBG MONITORING, ED - Abnormal; Notable for the following components:   Glucose-Capillary >600 (*)    All other components within normal limits  PROTIME-INR    EKG EKG Interpretation  Date/Time:  Sunday August 18 2019 07:55:29 EDT Ventricular Rate:  60 PR Interval:  160 QRS Duration: 102 QT Interval:  442 QTC Calculation: 442 R Axis:   -63 Text Interpretation: Normal sinus rhythm Left anterior fascicular block Anterolateral infarct , age undetermined Abnormal ECG No old tracing to compare Confirmed by Pattricia Boss [phone]) on 08/18/2019 11:25:23 AM   Radiology CT HEAD WO CONTRAST  Result Date: 08/18/2019 CLINICAL DATA:  Confusion, headache, unsteady gait, question stroke EXAM: CT HEAD WITHOUT CONTRAST TECHNIQUE: Contiguous axial  images were obtained from the base of the skull through the vertex without intravenous contrast. Sagittal and coronal MPR images reconstructed from axial data set. COMPARISON:  12/09/2017 FINDINGS: Brain: Generalized atrophy. Normal ventricular morphology. No midline shift or mass effect. Tiny old lacunar infarct inferior LEFT cerebellar hemisphere. Otherwise normal appearance of brain parenchyma. No intracranial hemorrhage, mass lesion, or evidence of acute infarction. No extra-axial fluid collections. Vascular: Minimal atherosclerotic calcification of internal carotid arteries at skull base. No hyperdense vessels. Skull: Normal appearance Sinuses/Orbits: Clear Other: N/A IMPRESSION: Generalized atrophy. Tiny old lacunar infarct LEFT cerebellar hemisphere inferiorly. No acute intracranial abnormalities. Electronically Signed   By: Lavonia Dana M.D.   On: 08/18/2019 09:59    Procedures .Critical Care Performed by: Pattricia Boss, MD Authorized by: Pattricia Boss, MD   Critical care provider statement:    Critical care time (minutes):  45   Critical care end time:  08/18/2019 1:25 PM   Critical care was necessary to treat or prevent imminent or life-threatening deterioration of the following conditions:  Endocrine crisis and dehydration   Critical care was time spent personally by me on the following activities:  Discussions with consultants, evaluation of patient's response to treatment, examination of patient, ordering and performing treatments and interventions, ordering and review of laboratory studies, ordering and review of radiographic studies, pulse oximetry, re-evaluation of patient's condition, obtaining history from patient or surrogate and review of old charts   (including critical care time)  Medications Ordered in ED Medications  sodium chloride flush (NS) 0.9 % injection 3 mL (has no administration in time range)    ED Course  I have reviewed the triage vital signs and the nursing  notes.  Pertinent labs & imaging results that were available during my care of the patient were reviewed by me and considered in my medical decision making (see chart for details).  Clinical Course as of Aug 17 1228  Sun Aug 18, 2019  1223 Anion gap 12 Creatinine newly elevated-known ckd but last creatinine at 1.33 Hyponatremia- sodium corrected to 132 HGB 19- would suggest significant hemoconcentration   Glucose(!!): 568 [DR]  1223 Reviewed images and report  CT HEAD WO CONTRAST [DR]    Clinical Course User Index [DR] Pattricia Boss, MD   MDM Rules/Calculators/A&P                          1- memory loss- stroke? With lkn last week and cerebellar infarct (old), however this could also be a metabolic encepholopathy as patient with significant metabolic derangement with hyperglycemia, hyponatremia ( although corrected to 132), new aki superimposed on ckd. 2- hyperlycemia- patient with known dm and significantly hyperglycemic here  although no evidence of dka. IV fluids infusing and will start insulin 3- hemoconcentration- likely secondary to #2  Plan admission for further evaluation and treatment. Discussed with Dr. Doristine Bosworth who will assume care  Final Clinical Impression(s) / ED Diagnoses Final diagnoses:  Memory loss  Hyperglycemia  Volume depletion    Rx / DC Orders ED Discharge Orders    None       Pattricia Boss, MD 08/18/19 1326

## 2019-08-18 NOTE — Progress Notes (Signed)
Spoke with pt about CPAP order per home use. Pt very confused about what Cpap is and unable at this time to answer any questions about mask or settings. RT will attempt again later.

## 2019-08-18 NOTE — ED Triage Notes (Addendum)
Wife reports short term memory loss for 1 week that is gradually getting worse.  Wife reports headache since noon yesterday.  Pt denies pain at present.  No arm drift.  Reports pt "walking sideways" and nausea since this morning.

## 2019-08-19 DIAGNOSIS — E869 Volume depletion, unspecified: Secondary | ICD-10-CM

## 2019-08-19 DIAGNOSIS — R739 Hyperglycemia, unspecified: Secondary | ICD-10-CM | POA: Diagnosis not present

## 2019-08-19 DIAGNOSIS — I10 Essential (primary) hypertension: Secondary | ICD-10-CM

## 2019-08-19 DIAGNOSIS — N1832 Chronic kidney disease, stage 3b: Secondary | ICD-10-CM

## 2019-08-19 DIAGNOSIS — G4733 Obstructive sleep apnea (adult) (pediatric): Secondary | ICD-10-CM

## 2019-08-19 LAB — BASIC METABOLIC PANEL
Anion gap: 10 (ref 5–15)
BUN: 39 mg/dL — ABNORMAL HIGH (ref 6–20)
CO2: 19 mmol/L — ABNORMAL LOW (ref 22–32)
Calcium: 7.5 mg/dL — ABNORMAL LOW (ref 8.9–10.3)
Chloride: 101 mmol/L (ref 98–111)
Creatinine, Ser: 2.28 mg/dL — ABNORMAL HIGH (ref 0.61–1.24)
GFR calc Af Amer: 36 mL/min — ABNORMAL LOW (ref >60)
GFR calc non Af Amer: 31 mL/min — ABNORMAL LOW (ref >60)
Glucose, Bld: 326 mg/dL — ABNORMAL HIGH (ref 70–99)
Potassium: 4 mmol/L (ref 3.5–5.1)
Sodium: 130 mmol/L — ABNORMAL LOW (ref 135–145)

## 2019-08-19 LAB — HEMOGLOBIN A1C
Hgb A1c MFr Bld: >15.5 % — ABNORMAL HIGH (ref 4.8–5.6)
Mean Plasma Glucose: >398 mg/dL

## 2019-08-19 LAB — GLUCOSE, CAPILLARY
Glucose-Capillary: 326 mg/dL — ABNORMAL HIGH (ref 70–99)
Glucose-Capillary: 246 mg/dL — ABNORMAL HIGH (ref 70–99)
Glucose-Capillary: 224 mg/dL — ABNORMAL HIGH (ref 70–99)

## 2019-08-19 MED ORDER — INSULIN ASPART 100 UNIT/ML ~~LOC~~ SOLN: 0.0000 [IU] | Freq: Three times a day (TID) | SUBCUTANEOUS | 11 refills | Status: DC

## 2019-08-19 MED ORDER — DIPHENHYDRAMINE HCL 50 MG/ML IJ SOLN
25.0000 mg | Freq: Once | INTRAMUSCULAR | Status: AC
  Administered 2019-08-19: 25 mg via INTRAVENOUS
  Filled 2019-08-19: qty 1

## 2019-08-19 MED FILL — NOVOLOG FLEXPEN SYRINGE: 100 | 24 days supply | Qty: 12 | Fill #0

## 2019-08-19 NOTE — Discharge Summary (Addendum)
Physician Discharge Summary  Daniel [name] XBW:[id number] DOB: 1965-07-16 DOA: 08/18/2019  PCP: Antonietta Jewel, MD  Admit date: 08/18/2019 Discharge date: 08/19/2019  Admitted From: Home  Disposition:  Home   Recommendations for Outpatient Follow-up and new medication changes:  1. Follow up with Dr. Sheryle Hail in 7 days.  2. Follow up renal function in 7 days. 3. Discontinue furosemide for now. 4. It has been added short acting insulin for better glucose cover.   Home Health: yes  Equipment/Devices: no    Discharge Condition: stable  CODE STATUS: full  Diet recommendation: heart healthy and diabetic prudent.   Brief/Interim Summary: Patient was admitted to the hospital with a working diagnosis of hyperglycemia and metabolic encephalopathy.   54 year old male with past medical history for hypertension, type 2 diabetes mellitus, hypothyroidism, chronic bilateral extremity edema, obesity class III and hypothyroidism. Patient reported 2 days of headaches, associated with mild cognitive impairment. Per his wife he had increase psychological stressors recently.  On his initial physical examination his blood pressure was 171/85, heart rate 61, respiratory rate 18, his lungs were clear to auscultation bilaterally, heart S1-S2, present rhythmic, soft abdomen, no lower extremity edema.   Sodium 125, potassium 4.5, chloride 93, bicarb 20, glucose 568, BUN 32, creatinine 2.0.  White count 10.4, hemoglobin 19.4, hematocrit 57.1, platelets 250.  SARS COVID-19 negative.  Urinalysis negative for infection.  Head CT with generalized atrophy, small old lacunar infarct left cerebellar hemisphere, no acute changes.  Chest radiograph negative for infiltrates. EKG 60 bpm, left axis deviation, left anterior fascicular block, sinus rhythm, poor R wave progression, no ST segment changes, negative T waves lead I/aVL.   1.  Uncontrolled T2DM with Hyperglycemia, complicated by metabolic encephalopathy.  Patient was  admitted to the medical ward, he was initially placed on intravenous fluids and intravenous insulin.  His glucose was appropriately controlled and he was transitioned to subcutaneous insulin with no major complications.  By the time of discharge his mentation is back to baseline.  Patient has been through significant psychological stressors as an outpatient.  I have recommended him and his wife to follow-up as an outpatient with psychiatry.  At this point no suicidal or homicidal thoughts.  Patient will continue taking insulin long-acting, Levemir 50 units twice daily and will add insulin sliding scale for glucose coverage and monitoring.  Follow-up as an outpatient.  Will arrange home health services.  2. CKD stage 3b.  His kidney function has remained stable with a serum creatinine of 2.0 and 2.28.  Last year creatinine was 1.33, I suspect his baseline now is 2.0.  I will recommend him follow-up kidney function within 7 days, in the short-term continue ACE INH and hold on furosemide.  3.  Hypertension.  Blood pressure remained well controlled, 130/75.resume lisinopril at discharge.   4.  Hypothyroidism.  Continue levothyroxine.  5.  Obstructive sleep apnea, obesity class III.  Continue CPAP at home, his calculated BMI is 42.4, he needs aggressive lifestyle modifications.  6.  Depression.  Continue citalopram.  Discharge Diagnoses:  Principal Problem:   Hyperglycemia Active Problems:   Hypertension   CKD (chronic kidney disease), stage III   Hyperlipidemia   OSA (obstructive sleep apnea)   Morbid obesity ([place])   Seizures (HCC)   Hyponatremia    Discharge Instructions   Allergies as of 08/19/2019      Reactions   Montelukast Other (See Comments)   Possibly cause headaches per spouse      Medication List  STOP taking these medications   fluticasone furoate-vilanterol 200-25 MCG/INH Aepb Commonly known as: BREO ELLIPTA   furosemide 20 MG tablet Commonly known as:  LASIX   hydrALAZINE 100 MG tablet Commonly known as: APRESOLINE   insulin aspart 100 UNIT/ML FlexPen Commonly known as: NOVOLOG Replaced by: insulin aspart 100 UNIT/ML injection   insulin glargine 100 UNIT/ML Solostar Pen Commonly known as: LANTUS   melatonin 3 MG Tabs tablet   metoprolol succinate 50 MG 24 hr tablet Commonly known as: TOPROL-XL   Muscle Rub 10-15 % Crea   pantoprazole 40 MG tablet Commonly known as: PROTONIX   polyethylene glycol 17 g packet Commonly known as: MIRALAX / GLYCOLAX     TAKE these medications   acetaminophen 325 MG tablet Commonly known as: TYLENOL Take 1-2 tablets (325-650 mg total) by mouth every 4 (four) hours as needed for mild pain.   aspirin EC 81 MG tablet Take 81 mg by mouth daily.   citalopram 20 MG tablet Commonly known as: CELEXA Take 20 mg by mouth daily.   gabapentin 100 MG capsule Commonly known as: NEURONTIN Take 1 capsule (100 mg total) by mouth 3 (three) times daily.   insulin aspart 100 UNIT/ML injection Commonly known as: novoLOG Inject 0-9 Units into the skin 3 (three) times daily with meals. For glucose 121 to 150 use 2 units, for 151-200 use 3 units, for 201 to 250 use 5 units, for 251 to 300 use 7 units, 301 to 350 use 10 units, for 351 or greater use 14 units. Replaces: insulin aspart 100 UNIT/ML FlexPen   insulin detemir 100 UNIT/ML injection Commonly known as: LEVEMIR Inject 60 Units into the skin 2 (two) times daily.   Insulin Pen Needle 32G X 6 MM Misc Commonly known as: CareFine Pen Needles 1 application by Does not apply route 2 (two) times daily.   levothyroxine 50 MCG tablet Commonly known as: SYNTHROID Take 1 tablet (50 mcg total) by mouth daily.   lisinopril 20 MG tablet Commonly known as: ZESTRIL Take 20 mg by mouth daily.   Proventil HFA 108 (90 Base) MCG/ACT inhaler Generic drug: albuterol Inhale 1 puff into the lungs every 6 (six) hours as needed for wheezing or shortness of breath.        Follow-up Information    Antonietta Jewel, MD Follow up in 1 week(s).   Specialty: Internal Medicine Contact information: [address] [place] [place] [address] [phone]              Allergies  Allergen Reactions   Montelukast Other (See Comments)    Possibly cause headaches per spouse        Procedures/Studies: CT HEAD WO CONTRAST  Result Date: 08/18/2019 CLINICAL DATA:  Confusion, headache, unsteady gait, question stroke EXAM: CT HEAD WITHOUT CONTRAST TECHNIQUE: Contiguous axial images were obtained from the base of the skull through the vertex without intravenous contrast. Sagittal and coronal MPR images reconstructed from axial data set. COMPARISON:  12/09/2017 FINDINGS: Brain: Generalized atrophy. Normal ventricular morphology. No midline shift or mass effect. Tiny old lacunar infarct inferior LEFT cerebellar hemisphere. Otherwise normal appearance of brain parenchyma. No intracranial hemorrhage, mass lesion, or evidence of acute infarction. No extra-axial fluid collections. Vascular: Minimal atherosclerotic calcification of internal carotid arteries at skull base. No hyperdense vessels. Skull: Normal appearance Sinuses/Orbits: Clear Other: N/A IMPRESSION: Generalized atrophy. Tiny old lacunar infarct LEFT cerebellar hemisphere inferiorly. No acute intracranial abnormalities. Electronically Signed   By: Crist Infante.D.  On: 08/18/2019 09:59   DG Chest Port 1 View  Result Date: 08/18/2019 CLINICAL DATA:  Headaches since yesterday. EXAM: PORTABLE CHEST 1 VIEW COMPARISON:  Radiographs 11/20/2017 and 11/09/2017 FINDINGS: 1335 hours. Lordotic positioning. PICC line has been removed. The heart size is stable at the upper limits of normal for AP technique. The lungs are clear. There is no pleural effusion or pneumothorax. No acute osseous findings. Telemetry leads overlie the chest. IMPRESSION: No active cardiopulmonary process. Electronically Signed   By: Richardean Sale  M.D.   On: 08/18/2019 13:50        Subjective: Patient is awake and alert, no nausea or vomiting, no chest pain or dyspnea, his wife is at bedside and confirms patient is at his baseline mentation.   Discharge Exam: Vitals:   08/19/19 0839 08/19/19 1145  BP: 103/63 130/75  Pulse: 60 (!) 57  Resp: 18 17  Temp: 98.5 F (36.9 C) 97.8 F (36.6 C)  SpO2: 99% 99%   Vitals:   08/18/19 2139 08/19/19 0606 08/19/19 0839 08/19/19 1145  BP: (!) 146/77 (!) 152/85 103/63 130/75  Pulse: (!) 57 60 60 (!) 57  Resp: 18 18 18 17   Temp: 98.5 F (36.9 C) 98.4 F (36.9 C) 98.5 F (36.9 C) 97.8 F (36.6 C)  TempSrc: Oral Oral Oral Oral  SpO2: 99% 99% 99% 99%  Weight: 134 kg 134 kg    Height: 5\' 10"  (1.778 m)       General: Not in pain or dyspnea.  Neurology: Awake and alert, non focal  E ENT: no pallor, no icterus, oral mucosa moist Cardiovascular: No JVD. S1-S2 present, rhythmic, no gallops, rubs, or murmurs. No lower extremity edema. Pulmonary: positive breath sounds bilaterally, adequate air movement, no wheezing, rhonchi or rales. Gastrointestinal. Abdomen protuberant with no organomegaly, non tender, no rebound or guarding Skin. No rashes Musculoskeletal: no joint deformities   The results of significant diagnostics from this hospitalization (including imaging, microbiology, ancillary and laboratory) are listed below for reference.     Microbiology: Recent Results (from the past 240 hour(s))  SARS Coronavirus 2 by RT PCR (hospital order, performed in [facility] hospital lab) Nasopharyngeal Nasopharyngeal Swab     Status: None   Collection Time: 08/18/19 12:41 PM   Specimen: Nasopharyngeal Swab  Result Value Ref Range Status   SARS Coronavirus 2 NEGATIVE NEGATIVE Final    Comment: (NOTE) SARS-CoV-2 target nucleic acids are NOT DETECTED.  The SARS-CoV-2 RNA is generally detectable in upper and lower respiratory specimens during the acute phase of infection. The  lowest concentration of SARS-CoV-2 viral copies this assay can detect is 250 copies / mL. A negative result does not preclude SARS-CoV-2 infection and should not be used as the sole basis for treatment or other patient management decisions.  A negative result may occur with improper specimen collection / handling, submission of specimen other than nasopharyngeal swab, presence of viral mutation(s) within the areas targeted by this assay, and inadequate number of viral copies (<250 copies / mL). A negative result must be combined with clinical observations, patient history, and epidemiological information.  Fact Sheet for Patients:   StrictlyIdeas.no  Fact Sheet for Healthcare Providers: BankingDealers.co.za  This test is not yet approved or  cleared by the [place] FDA and has been authorized for detection and/or diagnosis of SARS-CoV-2 by FDA under an Emergency Use Authorization (EUA).  This EUA will remain in effect (meaning this test can be used) for the duration of the COVID-19 declaration  under Section 564(b)(1) of the Act, 21 U.S.C. section 360bbb-3(b)(1), unless the authorization is terminated or revoked sooner.  Performed at [facility], [place] [address], [place], [place] [address]      Labs: BNP (last 3 results) No results for input(s): BNP in the last 8760 hours. Basic Metabolic Panel: Recent Labs  Lab 08/18/19 0820 08/18/19 1419 08/18/19 1538 08/18/19 2302 08/19/19 0325  NA 125* 129* 131* 133* 130*  K 4.5 4.3 4.0 4.2 4.0  CL 93* 97* 100 102 101  CO2 20* 22 22 22  19*  GLUCOSE 568* 442* 395* 217* 326*  BUN 32* 35* 37* 37* 39*  CREATININE 2.06* 2.09* 2.12* 2.00* 2.28*  CALCIUM 8.2* 8.0* 7.9* 7.8* 7.5*  MG  --  2.0  --   --   --    Liver Function Tests: Recent Labs  Lab 08/18/19 0820  AST 11*  ALT 13  ALKPHOS 84  BILITOT 0.9  PROT 6.4*  ALBUMIN 2.7*   No results for input(s): LIPASE, AMYLASE  in the last 168 hours. Recent Labs  Lab 08/18/19 1419  AMMONIA 11   CBC: Recent Labs  Lab 08/18/19 0820 08/18/19 1419  WBC 10.4 11.6*  NEUTROABS 7.8*  --   HGB 19.4* 18.4*  HCT 57.1* 55.3*  MCV 81.5 82.9  PLT 250 267   Cardiac Enzymes: No results for input(s): CKTOTAL, CKMB, CKMBINDEX, TROPONINI in the last 168 hours. BNP: Invalid input(s): POCBNP CBG: Recent Labs  Lab 08/18/19 2043 08/18/19 2150 08/19/19 0304 08/19/19 0831 08/19/19 1140  GLUCAP 143* 167* 326* 246* 224*   D-Dimer No results for input(s): DDIMER in the last 72 hours. Hgb A1c Recent Labs    08/18/19 1419  HGBA1C >15.5*   Lipid Profile Recent Labs    08/18/19 1419  CHOL 311*  HDL 29*  LDLCALC 217*  TRIG 324*  CHOLHDL 10.7   Thyroid function studies Recent Labs    08/18/19 1419  TSH 2.702   Anemia work up Recent Labs    08/18/19 1419  VITAMINB12 219  FOLATE 17.4   Urinalysis    Component Value Date/Time   COLORURINE YELLOW 08/18/2019 1320   APPEARANCEUR CLEAR 08/18/2019 1320   LABSPEC 1.025 08/18/2019 1320   PHURINE 5.0 08/18/2019 1320   GLUCOSEU >=500 (A) 08/18/2019 1320   HGBUR MODERATE (A) 08/18/2019 1320   BILIRUBINUR NEGATIVE 08/18/2019 1320   KETONESUR NEGATIVE 08/18/2019 1320   PROTEINUR >=300 (A) 08/18/2019 1320   NITRITE NEGATIVE 08/18/2019 1320   LEUKOCYTESUR NEGATIVE 08/18/2019 1320   Sepsis Labs Invalid input(s): PROCALCITONIN,  WBC,  LACTICIDVEN Microbiology Recent Results (from the past 240 hour(s))  SARS Coronavirus 2 by RT PCR (hospital order, performed in [facility] hospital lab) Nasopharyngeal Nasopharyngeal Swab     Status: None   Collection Time: 08/18/19 12:41 PM   Specimen: Nasopharyngeal Swab  Result Value Ref Range Status   SARS Coronavirus 2 NEGATIVE NEGATIVE Final    Comment: (NOTE) SARS-CoV-2 target nucleic acids are NOT DETECTED.  The SARS-CoV-2 RNA is generally detectable in upper and lower respiratory specimens during the acute phase  of infection. The lowest concentration of SARS-CoV-2 viral copies this assay can detect is 250 copies / mL. A negative result does not preclude SARS-CoV-2 infection and should not be used as the sole basis for treatment or other patient management decisions.  A negative result may occur with improper specimen collection / handling, submission of specimen other than nasopharyngeal swab, presence of viral mutation(s) within the  areas targeted by this assay, and inadequate number of viral copies (<250 copies / mL). A negative result must be combined with clinical observations, patient history, and epidemiological information.  Fact Sheet for Patients:   StrictlyIdeas.no  Fact Sheet for Healthcare Providers: BankingDealers.co.za  This test is not yet approved or  cleared by the [place] FDA and has been authorized for detection and/or diagnosis of SARS-CoV-2 by FDA under an Emergency Use Authorization (EUA).  This EUA will remain in effect (meaning this test can be used) for the duration of the COVID-19 declaration under Section 564(b)(1) of the Act, 21 U.S.C. section 360bbb-3(b)(1), unless the authorization is terminated or revoked sooner.  Performed at [facility], [place] [address] [place], [place] [address]      Time coordinating discharge: 45 minutes  SIGNED:   Tawni Millers, MD  [facility] 08/19/2019, 11:51 AM

## 2019-08-19 NOTE — Evaluation (Signed)
Occupational Therapy Evaluation Patient Details Name: Daniel [name] MRN: [id number] DOB: April 28, 1965 Today's Date: 08/19/2019    History of Present Illness Pt is a 54 yo male admitted to [facility] with Hyperglycemia d/t uncontrolled E1RA, complicated by metabolic encephalopathy.  PMHx includes  TIAs, migraines, DM2, PE, seizures, pna 2017 and 2019, , STM loss and use of Cpap at home. CT head negative for acute finding.    Clinical Impression   This 54 y/o male presents with the above. PLOF/home setup obtained from spouse as pt with notable cognitive impairments and unreliable historian, independent with basic ADL/moblity PTA. Pt dressed upon arrival in preparation for d/c home, but is agreeable to OT session. Pt currently requiring very close minguard-minA for room level mobility and standing grooming ADL without AD. Pt with x2 LOB with standing/dynamic tasks requiring minA to recover. Pt confabulatory and often with nonsensical speech during session. Reinforced with pt/pt's spouse need for hands on 24hr supervision/assist as well as use of RW for mobility tasks after return home given current concerns including pt's intermittent unsteadiness, cognition, and as pt is currently at a higher risk for falls,pt's spouse verbalizing understanding. Recommend follow up [facility] services after discharge to further maximize his safety and independence with ADL/mobility. Acute OT to follow.     Follow Up Recommendations  Home health OT;Supervision/Assistance - 24 hour    Equipment Recommendations  None recommended by OT (pt's DME needs are met )           Precautions / Restrictions Precautions Precautions: Fall Restrictions Weight Bearing Restrictions: No      Mobility Bed Mobility               General bed mobility comments: OOB in chair upon arrival   Transfers Overall transfer level: Needs assistance Equipment used: None Transfers: Sit to/from Stand Sit to Stand: Min guard          General transfer comment: for balance and safety    Balance Overall balance assessment: Needs assistance Sitting-balance support: Feet supported Sitting balance-Leahy Scale: Good     Standing balance support: No upper extremity supported;During functional activity;Single extremity supported Standing balance-Leahy Scale: Poor Standing balance comment: able to maintain static balance for short periods with minguard, LOB intermittently requiring assist to correct                            ADL either performed or assessed with clinical judgement   ADL Overall ADL's : Needs assistance/impaired Eating/Feeding: Modified independent;Sitting   Grooming: Supervision/safety;Standing;Wash/dry face;Oral care Grooming Details (indicate cue type and reason): supervision for balance in standing, able to sequence through ADL task on his own . pt with x1 LOB when turning away from sink to return to chair  Upper Body Bathing: Supervision/ safety;Sitting   Lower Body Bathing: Min guard;Sit to/from stand   Upper Body Dressing : Supervision/safety;Set up;Sitting   Lower Body Dressing: Min guard;Minimal assistance;Sit to/from stand   Toilet Transfer: Min guard;Ambulation;Minimal assistance Toilet Transfer Details (indicate cue type and reason): simulated via transfer to/from chair, close minguard as pt with occasional LOB when up on his feet  Toileting- Clothing Manipulation and Hygiene: Min guard;Sitting/lateral lean;Sit to/from stand       Functional mobility during ADLs: Min guard;Minimal assistance General ADL Comments: pt with notable cognitive impairments, x2 LOB noted during session. Reinforced to spouse need for 24hr supervision/assist and need for use of RW initially after return home to maximize his  safety with ADL and mobility tasks                          Pertinent Vitals/Pain Pain Assessment: No/denies pain     Hand Dominance Right   Extremity/Trunk  Assessment Upper Extremity Assessment Upper Extremity Assessment: Overall WFL for tasks assessed   Lower Extremity Assessment Lower Extremity Assessment: Defer to PT evaluation       Communication Communication Communication: No difficulties   Cognition Arousal/Alertness: Awake/alert Behavior During Therapy: Impulsive Overall Cognitive Status: Impaired/Different from baseline Area of Impairment: Orientation;Attention;Following commands;Safety/judgement;Awareness;Memory                 Orientation Level: Disoriented to;Place;Situation;Time Current Attention Level: Focused Memory: Decreased short-term memory Following Commands: Follows one step commands inconsistently;Follows one step commands with increased time Safety/Judgement: Decreased awareness of safety;Decreased awareness of deficits Awareness: Intellectual   General Comments: pt with confabulatory/nonsensical speech during session; poor safety awareness and insight into deficits    General Comments       Exercises     Shoulder Instructions      Home Living Family/patient expects to be discharged to:: Private residence Living Arrangements: Spouse/significant other Available Help at Discharge: Family;Available 24 hours/day Type of Home: House Home Access: Stairs to enter [employer] of Steps: 3 Entrance Stairs-Rails: Right;Left Home Layout: One level     Bathroom Shower/Tub: Teacher, early years/pre: Standard     Home Equipment: Environmental consultant - 2 wheels;Bedside commode;Shower seat;Hand held shower head          Prior Functioning/Environment Level of Independence: Independent        Comments: spouse assists with iADL        OT Problem List: Decreased strength;Decreased range of motion;Decreased activity tolerance;Impaired balance (sitting and/or standing);Decreased cognition;Decreased safety awareness;Decreased knowledge of precautions;Decreased knowledge of use of DME or  AE;Obesity      OT Treatment/Interventions: Self-care/ADL training;Therapeutic exercise;Energy conservation;DME and/or AE instruction;Therapeutic activities;Cognitive remediation/compensation;Patient/family education;Balance training    OT Goals(Current goals can be found in the care plan section) Acute Rehab OT Goals Patient Stated Goal: home OT Goal Formulation: With patient Time For Goal Achievement: 09/02/19 Potential to Achieve Goals: Good  OT Frequency: Min 2X/week   Barriers to D/C:            Co-evaluation              AM-PAC OT "6 Clicks" Daily Activity     Outcome Measure Help from another person eating meals?: None Help from another person taking care of personal grooming?: A Little Help from another person toileting, which includes using toliet, bedpan, or urinal?: A Little Help from another person bathing (including washing, rinsing, drying)?: A Little Help from another person to put on and taking off regular upper body clothing?: A Little Help from another person to put on and taking off regular lower body clothing?: A Little 6 Click Score: 19   End of Session Nurse Communication: Mobility status  Activity Tolerance: Patient tolerated treatment well Patient left: in chair;with call bell/phone within reach;with family/visitor present  OT Visit Diagnosis: Unsteadiness on feet (R26.81);Other symptoms and signs involving cognitive function                Time: 1340-1358 OT Time Calculation (min): 18 min Charges:  OT General Charges $OT Visit: 1 Visit OT Evaluation $OT Eval Moderate Complexity: [place], OT Acute Rehabilitation Services Pager [phone] Office [phone]  Raymondo Band 08/19/2019, 2:37 PM

## 2019-08-19 NOTE — Progress Notes (Signed)
Inpatient Diabetes Program Recommendations  AACE/ADA: New Consensus Statement on Inpatient Glycemic Control (2015)  Target Ranges:  Prepandial:   less than 140 mg/dL      Peak postprandial:   less than 180 mg/dL (1-2 hours)      Critically ill patients:  140 - 180 mg/dL   Lab Results  Component Value Date   GLUCAP 246 (H) 08/19/2019   HGBA1C 15.8 (H) 10/28/2017    Review of Glycemic Control  Diabetes history: DM 2 Outpatient Diabetes medications: Levemir 60 units bid, Novolog 5 units tid meal coverage tid (listed on med rec) Current orders for Inpatient glycemic control:  Levemir 50 units bid Novolog 0-9 units tid  A1c in process  Glucose 246 this am. Pt to get second dose of Levemir 50 units this am. Will follow glucose trends for now.  Will speak with pt when appropriate this admission regarding glucose control and trends at home.  Thanks,  Tama Headings RN, MSN, BC-ADM Inpatient Diabetes Coordinator Team Pager [phone] (8a-5p)

## 2019-08-19 NOTE — Care Management Obs Status (Signed)
[facility] NOTIFICATION   Patient Details  Name: Daniel [name] MRN: [id number] Date of Birth: May 27, 1965   Medicare Observation Status Notification Given:  Yes    Ninfa Meeker, RN 08/19/2019, 12:53 PM

## 2019-08-19 NOTE — Progress Notes (Signed)
Patient alert & oriented x2, pulling at IV lines and tele monitor. Patient pulled and removed IV. Patient refusing IV infusion, was able to keep tele monitor on. Paged [facility], no new orders. Will continue to monitor patient.

## 2019-08-19 NOTE — Care Management CC44 (Signed)
Condition Code 44 Documentation Completed  Patient Details  Name: Daniel [name] MRN: [id number] Date of Birth: [birth year]   Condition Code 44 given:  Yes Patient signature on Condition Code 44 notice:  Yes Documentation of 2 MD's agreement:  Yes Code 44 added to claim:  Yes    Ninfa Meeker, RN 08/19/2019, 12:53 PM

## 2019-08-19 NOTE — Evaluation (Signed)
Clinical/Bedside Swallow Evaluation Patient Details  Name: Daniel [name] MRN: [id number] Date of Birth: 1965/04/26  Today's Date: 08/19/2019 Time: SLP Start Time (ACUTE ONLY): 0855 SLP Stop Time (ACUTE ONLY): 0919 SLP Time Calculation (min) (ACUTE ONLY): 24 min  Past Medical History:  Past Medical History:  Diagnosis Date  . Arthritis    "elbows and wrists" (11/13/2017)  . CHF (congestive heart failure) ([place]) 08/2016  . CKD stage 3 due to type 2 diabetes mellitus ([place]) 06/2016  . Diabetes mellitus type 2 in obese ([place])   . High cholesterol   . Hypertension   . Hypothyroidism   . Left foot drop 04/12/2018  . Migraines   . Morbid obesity ([place])   . On home oxygen therapy    "3L; now only at night" (11/13/2017)  . OSA on CPAP   . Pneumonia 2017   "double pneumonia"  . Pneumonia 10/2017   "while in ICU" (11/13/2017)  . Pulmonary embolism ([place])   . Seizure ([place])    "10/24/2017-10/28/2017; couldn't get them stopped til medically induced coma. after they brought him out he started having more; can't find RX to stop them" (11/13/2017)  . Stroke [facility])    "scans showed several mini strokes in the past; nothing recent" (11/13/2017)   Past Surgical History:  Past Surgical History:  Procedure Laterality Date  . CARDIAC CATHETERIZATION  12/2016   HPI:  pt is a 54 yo male adm to [facility] with hyperglycemia.  Pt PMH + for TIAs, migraines, DM2, PE, seizures, pna 2017 and 2019, , STM loss and he uses Cpap at home.  Swallow eval ordered.  He is currently on protonix. CXR negative for acute finding.   Assessment / Plan / Recommendation Clinical Impression  Pt presents with functional oropharyngeal swallow ability. No focal CN deficit (decreased left eye opening) nor indication of aspiration or dysphagia with all po observed.  Pt easily passed 3 ounce water test.  He denies dysphagia nor reflux symptoms but is currently on a PPI. SLP advised he follow general reflux precautions. SLP Visit Diagnosis:  Dysphagia, unspecified (R13.10)    Aspiration Risk  No limitations    Diet Recommendation Thin liquid;Regular   Liquid Administration via: Cup;Straw Medication Administration: Whole meds with liquid Supervision: Patient able to self feed Compensations: Slow rate;Small sips/bites Postural Changes: Seated upright at 90 degrees;Remain upright for at least 30 minutes after po intake    Other  Recommendations Oral Care Recommendations: Oral care BID   Follow up Recommendations None      Frequency and Duration     n/a       Prognosis    n/a    Swallow Study   General Date of Onset: 08/19/19 HPI: pt is a 54 yo male adm to [facility] with hyperglycemia.  Pt PMH + for TIAs, migraines, DM2, PE, seizures, pna 2017 and 2019, , STM loss and he uses Cpap at home.  Swallow eval ordered.  He is currently on protonix. CXR negative for acute finding. Type of Study: Bedside Swallow Evaluation Diet Prior to this Study: Regular;Thin liquids Temperature Spikes Noted: No Respiratory Status: Room air History of Recent Intubation: No Behavior/Cognition: Alert;Cooperative;Pleasant mood Oral Cavity Assessment: Within Functional Limits Oral Care Completed by SLP: No Oral Cavity - Dentition: Adequate natural dentition Vision: Functional for self-feeding Self-Feeding Abilities: Able to feed self Patient Positioning: Upright in bed Baseline Vocal Quality: Normal (pt states voice is weaker than normal) Volitional Cough: Strong Volitional Swallow: Able to elicit  Oral/Motor/Sensory Function Overall Oral Motor/Sensory Function: Within functional limits (left eye frequently closed)   Ice Chips Ice chips: Not tested   Thin Liquid Thin Liquid: Within functional limits Presentation: Cup;Straw    Nectar Thick Nectar Thick Liquid: Not tested   Honey Thick Honey Thick Liquid: Not tested   Puree Puree: Within functional limits Presentation: Self Fed;Spoon   Solid     Solid: Within functional  limits Presentation: Self Fredirick Lathe 08/19/2019,9:44 AM   Kathleen Lime, MS [place] Office [phone]

## 2019-08-19 NOTE — TOC Transition Note (Signed)
Transition of Care [facility]) - CM/SW Discharge Note   Patient Details  Name: Daniel [name] MRN: [id number] Date of Birth: Dec 13, 1965  Transition of Care [facility]) CM/SW Contact:  Ninfa Meeker, RN Phone Number: 08/19/2019, 1:08 PM   Clinical Narrative:   Case manager entered [place] information for patient. Meds to be filled by TOC. PAtient has a PCP.    Final next level of care: Home/Self Care Barriers to Discharge: No Barriers Identified   Patient Goals and CMS Choice        Discharge Placement                       Discharge Plan and Services   Discharge Planning Services: CM Consult, Medication Assistance, [place] Program            DME Arranged: N/A DME Agency: NA       HH Arranged: NA HH Agency: NA        Social Determinants of Health (SDOH) Interventions     Readmission Risk Interventions No flowsheet data found.

## 2019-08-19 NOTE — Evaluation (Signed)
Physical Therapy Evaluation Patient Details Name: Daniel [name] MRN: [id number] DOB: 04/18/1965 Today's Date: 08/19/2019   History of Present Illness  Pt is a 54 yo male admitted to [facility] with Hyperglycemia d/t uncontrolled O0AY, complicated by metabolic encephalopathy.  PMHx includes  TIAs, migraines, DM2, PE, seizures, pna 2017 and 2019, , STM loss and use of Cpap at home. CT head negative for acute finding.   Clinical Impression  Pt awaiting d/c, however OT request PT Evaluation before going home. Pt and wife in room. Pt's wife provides all relevant historical and set up information. Pt with inappropriate confabulatory/tangential interjections during conversation. Pt tries to use humor to cover deficits however this technique is no longer working well as parts of the "jokes" are left out making his responses nonsensical. Pt sitting in straight back chair near door, provided with RW and he is able to stand with min guard. However, with ambulation in hallway to mobilizes with variable velocity at times too fast for conditions causing LoB requiring heavy modA to steady. Pt is very strong but lacks safe coordination decreasing safety. Pt with difficulty with command follow to improve safety. PT recommending HHPT to assess safety in home environment, and to improve balance and coordination. Pt d/c'ing this afternoon.     Follow Up Recommendations Home health PT;Supervision for mobility/OOB    Equipment Recommendations  Other (comment) (has RW at home)       Precautions / Restrictions Precautions Precautions: Fall Restrictions Weight Bearing Restrictions: No      Mobility  Bed Mobility               General bed mobility comments: OOB in chair upon arrival   Transfers Overall transfer level: Needs assistance Equipment used: None Transfers: Sit to/from Stand Sit to Stand: Min guard         General transfer comment: for balance and safety  Ambulation/Gait Ambulation/Gait  assistance: Min assist;Mod assist Gait Distance (Feet): 80 Feet Assistive device: Rolling walker (2 wheeled) Gait Pattern/deviations: Step-through pattern;Drifts right/left Gait velocity: too fast for conditions    General Gait Details: min A - modA for steadying with RW, pt reports he has RW at home but doesn't use it, however does not exhibit knowledge or safe usage, difficulty with command follow, ambulation at velocity too fast with flexed forward posture, requires use of belt to steady, when asked to turn around in hallway to go back to room turns 360 degrees. pt experiences 3x LoB requiring heavy mod A for steadying     Balance Overall balance assessment: Needs assistance Sitting-balance support: Feet supported Sitting balance-Leahy Scale: Good     Standing balance support: No upper extremity supported;During functional activity;Single extremity supported Standing balance-Leahy Scale: Poor Standing balance comment: requires UE support for static balance,                              Pertinent Vitals/Pain Pain Assessment: No/denies pain    Home Living Family/patient expects to be discharged to:: Private residence Living Arrangements: Spouse/significant other Available Help at Discharge: Family;Available 24 hours/day Type of Home: House Home Access: Stairs to enter Entrance Stairs-Rails: Psychiatric nurse of Steps: 3 Home Layout: One level Home Equipment: Walker - 2 wheels;Bedside commode;Shower seat;Hand held shower head      Prior Function Level of Independence: Independent         Comments: spouse assists with iADL     Hand Dominance  Dominant Hand: Right    Extremity/Trunk Assessment   Upper Extremity Assessment Upper Extremity Assessment: Defer to OT evaluation    Lower Extremity Assessment Lower Extremity Assessment: Overall WFL for tasks assessed       Communication   Communication: No difficulties  Cognition  Arousal/Alertness: Awake/alert Behavior During Therapy: Impulsive Overall Cognitive Status: Impaired/Different from baseline Area of Impairment: Orientation;Attention;Following commands;Safety/judgement;Awareness;Memory                 Orientation Level: Disoriented to;Place;Situation;Time Current Attention Level: Focused Memory: Decreased short-term memory Following Commands: Follows one step commands inconsistently;Follows one step commands with increased time Safety/Judgement: Decreased awareness of safety;Decreased awareness of deficits Awareness: Intellectual   General Comments: pt with confabulatory/nonsensical speech during session; poor safety awareness and insight into deficits       General Comments General comments (skin integrity, edema, etc.): wife in room who assists with historical information, pt interupts with tangetial information, stable  on RA         Assessment/Plan    PT Assessment Patient needs continued PT services  PT Problem List Decreased balance;Decreased mobility;Decreased coordination;Decreased cognition;Decreased knowledge of use of DME;Decreased safety awareness       PT Treatment Interventions DME instruction;Gait training;Stair training;Functional mobility training;Therapeutic activities;Therapeutic exercise;Balance training;Cognitive remediation;Patient/family education    PT Goals (Current goals can be found in the Care Plan section)  Acute Rehab PT Goals Patient Stated Goal: home PT Goal Formulation: With family Time For Goal Achievement: 09/02/19 Potential to Achieve Goals: Fair    Frequency Min 3X/week    AM-PAC PT "6 Clicks" Mobility  Outcome Measure Help needed turning from your back to your side while in a flat bed without using bedrails?: None Help needed moving from lying on your back to sitting on the side of a flat bed without using bedrails?: None Help needed moving to and from a bed to a chair (including a  wheelchair)?: A Little Help needed standing up from a chair using your arms (e.g., wheelchair or bedside chair)?: A Little Help needed to walk in hospital room?: A Lot Help needed climbing 3-5 steps with a railing? : A Lot 6 Click Score: 18    End of Session Equipment Utilized During Treatment: Gait belt Activity Tolerance: Patient tolerated treatment well Patient left: in chair;with family/visitor present (in straight back chair at door awaiting d/c ) Nurse Communication: Mobility status;Other (comment) (concern for d/c) PT Visit Diagnosis: Other symptoms and signs involving the nervous system (R29.898);Other abnormalities of gait and mobility (R26.89);Unsteadiness on feet (R26.81);Difficulty in walking, not elsewhere classified (R26.2)    Time: 3354-5625 PT Time Calculation (min) (ACUTE ONLY): 15 min   Charges:   PT Evaluation $PT Eval Moderate Complexity: 1 Mod          Jamilla Galli B. Migdalia Dk PT, DPT Acute Rehabilitation Services Pager [phone] Office [phone]   [place] 08/19/2019, 2:51 PM

## 2019-08-19 NOTE — TOC Transition Note (Signed)
Transition of Care [facility]) - CM/SW Discharge Note   Patient Details  Name: BRENIN HEIDELBERGER MRN: [id number] Date of Birth: 05-05-1965  Transition of Care [facility]) CM/SW Contact:  Ninfa Meeker, RN Phone Number: 08/19/2019, 2:17 PM   Clinical Narrative:   Case manager spoke with Mrs. [place]wbank concerning need for [facility] therapy as recommended by therapy. She is agreeable. Has no agency preference. CM called referral to [facility] with [facility].  No DME needs.    Final next level of care: Home w Home Health Services Barriers to Discharge: No Barriers Identified   Patient Goals and CMS Choice Patient states their goals for this hospitalization and ongoing recovery are:: wife wants him to get better CMS Medicare.gov Compare Post Acute Care list provided to:: Patient Represenative (must comment) (wife) Choice offered to / list presented to : Spouse  Discharge Placement                       Discharge Plan and Services In-house Referral: NA Discharge Planning Services: CM Consult Post Acute Care Choice: Home Health          DME Arranged: N/A DME Agency: NA       HH Arranged: PT, OT HH Agency: [place] Date [place]: 08/19/19 Time [place]: 1416 Representative spoke with at [place]: [place] ([place]) Interventions     Readmission Risk Interventions No flowsheet data found.

## 2019-08-19 NOTE — Progress Notes (Signed)
Inpatient Diabetes Program Recommendations  AACE/ADA: New Consensus Statement on Inpatient Glycemic Control (2015)  Target Ranges:  Prepandial:   less than 140 mg/dL      Peak postprandial:   less than 180 mg/dL (1-2 hours)      Critically ill patients:  140 - 180 mg/dL   Lab Results  Component Value Date   GLUCAP 224 (H) 08/19/2019   HGBA1C >15.5 (H) 08/18/2019    Review of Glycemic Control  Diabetes history: DM 2 Outpatient Diabetes medications: Levemir 60 units bid Current orders for Inpatient glycemic control:  Levemir 50 units bid Novolog 0-9 units tid  Glucose 568 on presentation A1c >15.5%  Spoke with pt and wife at bedside. Wife reporting glucose trends were not well controlled at home and that the "insulin was not working". Dr. Cathlean Sauer at bedside  Speaking with pt and wife as well. Discussed importance of glucose control at home. Wife reports assistance with insulin was needed.   Gave wife Novo Patient Assistance Program application to fill out and give to pt's PCP this week in order to continue to get his insulins. Pt's wife reports pt was on short acting insulin at one time, however they stopped delivering the insulin to their PCP office. Pt thinks glucose would trends better on sliding scale at time of d/c.  Discussed how to use sliding scale at home. Wife to ask PCP about additional testing strips at their visit within the next week. Did discussed potential need for Endocrinology in the future. Wife stating confusion is better today on patient. And pt reports he actually feels like joking around today. Still converses inappropriately based on conversation subject matter occasionally. Wife to also get PCP to refer pt to psychiatry.  Pt to be on Levemir bid and Novolog SSI Discussed plan of care with Manuela Schwartz, CM with TOC team. Pt to receive match letter and follow up with PCP regarding patient assistance form (which they have done before).  Thanks,  Tama Headings RN, MSN,  BC-ADM Inpatient Diabetes Coordinator Team Pager [phone] (8a-5p)

## 2019-09-18 ENCOUNTER — Emergency Department (HOSPITAL_COMMUNITY): Payer: Medicare Other

## 2019-09-18 ENCOUNTER — Inpatient Hospital Stay (HOSPITAL_COMMUNITY): Payer: Medicare Other

## 2019-09-18 ENCOUNTER — Encounter (HOSPITAL_COMMUNITY): Payer: Self-pay | Admitting: *Deleted

## 2019-09-18 ENCOUNTER — Inpatient Hospital Stay (HOSPITAL_COMMUNITY)
Admission: EM | Admit: 2019-09-18 | Discharge: 2019-09-21 | DRG: 064 | Disposition: A | Discharge status: Home Health | Payer: Medicare Other | Attending: Internal Medicine | Admitting: Internal Medicine

## 2019-09-18 ENCOUNTER — Other Ambulatory Visit: Payer: Self-pay

## 2019-09-18 DIAGNOSIS — I5023 Acute on chronic systolic (congestive) heart failure: Secondary | ICD-10-CM | POA: Diagnosis present

## 2019-09-18 DIAGNOSIS — E1169 Type 2 diabetes mellitus with other specified complication: Secondary | ICD-10-CM | POA: Diagnosis present

## 2019-09-18 DIAGNOSIS — Z83511 Family history of glaucoma: Secondary | ICD-10-CM

## 2019-09-18 DIAGNOSIS — J9601 Acute respiratory failure with hypoxia: Secondary | ICD-10-CM | POA: Diagnosis not present

## 2019-09-18 DIAGNOSIS — E1142 Type 2 diabetes mellitus with diabetic polyneuropathy: Secondary | ICD-10-CM | POA: Diagnosis present

## 2019-09-18 DIAGNOSIS — G9341 Metabolic encephalopathy: Secondary | ICD-10-CM | POA: Diagnosis present

## 2019-09-18 DIAGNOSIS — Z833 Family history of diabetes mellitus: Secondary | ICD-10-CM

## 2019-09-18 DIAGNOSIS — G43909 Migraine, unspecified, not intractable, without status migrainosus: Secondary | ICD-10-CM | POA: Diagnosis present

## 2019-09-18 DIAGNOSIS — I63311 Cerebral infarction due to thrombosis of right middle cerebral artery: Secondary | ICD-10-CM | POA: Diagnosis not present

## 2019-09-18 DIAGNOSIS — Z79890 Hormone replacement therapy (postmenopausal): Secondary | ICD-10-CM

## 2019-09-18 DIAGNOSIS — G4733 Obstructive sleep apnea (adult) (pediatric): Secondary | ICD-10-CM | POA: Diagnosis present

## 2019-09-18 DIAGNOSIS — Z0189 Encounter for other specified special examinations: Secondary | ICD-10-CM

## 2019-09-18 DIAGNOSIS — E1151 Type 2 diabetes mellitus with diabetic peripheral angiopathy without gangrene: Secondary | ICD-10-CM | POA: Diagnosis present

## 2019-09-18 DIAGNOSIS — E039 Hypothyroidism, unspecified: Secondary | ICD-10-CM | POA: Diagnosis present

## 2019-09-18 DIAGNOSIS — Z79899 Other long term (current) drug therapy: Secondary | ICD-10-CM

## 2019-09-18 DIAGNOSIS — E7800 Pure hypercholesterolemia, unspecified: Secondary | ICD-10-CM | POA: Diagnosis present

## 2019-09-18 DIAGNOSIS — N183 Chronic kidney disease, stage 3 unspecified: Secondary | ICD-10-CM | POA: Diagnosis present

## 2019-09-18 DIAGNOSIS — Z8673 Personal history of transient ischemic attack (TIA), and cerebral infarction without residual deficits: Secondary | ICD-10-CM | POA: Diagnosis not present

## 2019-09-18 DIAGNOSIS — R0602 Shortness of breath: Secondary | ICD-10-CM

## 2019-09-18 DIAGNOSIS — F329 Major depressive disorder, single episode, unspecified: Secondary | ICD-10-CM | POA: Diagnosis present

## 2019-09-18 DIAGNOSIS — Z20822 Contact with and (suspected) exposure to covid-19: Secondary | ICD-10-CM | POA: Diagnosis present

## 2019-09-18 DIAGNOSIS — E669 Obesity, unspecified: Secondary | ICD-10-CM

## 2019-09-18 DIAGNOSIS — R739 Hyperglycemia, unspecified: Secondary | ICD-10-CM | POA: Diagnosis present

## 2019-09-18 DIAGNOSIS — I6389 Other cerebral infarction: Secondary | ICD-10-CM | POA: Diagnosis not present

## 2019-09-18 DIAGNOSIS — Z86711 Personal history of pulmonary embolism: Secondary | ICD-10-CM | POA: Diagnosis not present

## 2019-09-18 DIAGNOSIS — Z6841 Body Mass Index (BMI) 40.0 and over, adult: Secondary | ICD-10-CM | POA: Diagnosis not present

## 2019-09-18 DIAGNOSIS — R569 Unspecified convulsions: Secondary | ICD-10-CM

## 2019-09-18 DIAGNOSIS — M21372 Foot drop, left foot: Secondary | ICD-10-CM | POA: Diagnosis present

## 2019-09-18 DIAGNOSIS — E785 Hyperlipidemia, unspecified: Secondary | ICD-10-CM | POA: Diagnosis present

## 2019-09-18 DIAGNOSIS — R29700 NIHSS score 0: Secondary | ICD-10-CM | POA: Diagnosis present

## 2019-09-18 DIAGNOSIS — Z794 Long term (current) use of insulin: Secondary | ICD-10-CM

## 2019-09-18 DIAGNOSIS — N1832 Chronic kidney disease, stage 3b: Secondary | ICD-10-CM | POA: Diagnosis present

## 2019-09-18 DIAGNOSIS — Z7982 Long term (current) use of aspirin: Secondary | ICD-10-CM

## 2019-09-18 DIAGNOSIS — Z888 Allergy status to other drugs, medicaments and biological substances status: Secondary | ICD-10-CM

## 2019-09-18 DIAGNOSIS — G40909 Epilepsy, unspecified, not intractable, without status epilepticus: Secondary | ICD-10-CM | POA: Diagnosis present

## 2019-09-18 DIAGNOSIS — N179 Acute kidney failure, unspecified: Secondary | ICD-10-CM

## 2019-09-18 DIAGNOSIS — E1122 Type 2 diabetes mellitus with diabetic chronic kidney disease: Secondary | ICD-10-CM | POA: Diagnosis present

## 2019-09-18 DIAGNOSIS — I639 Cerebral infarction, unspecified: Secondary | ICD-10-CM | POA: Diagnosis present

## 2019-09-18 DIAGNOSIS — Z9981 Dependence on supplemental oxygen: Secondary | ICD-10-CM | POA: Diagnosis not present

## 2019-09-18 DIAGNOSIS — Z1389 Encounter for screening for other disorder: Secondary | ICD-10-CM

## 2019-09-18 LAB — HEPATIC FUNCTION PANEL
ALT: 13 U/L (ref 0–44)
AST: 20 U/L (ref 15–41)
Albumin: 2.4 g/dL — ABNORMAL LOW (ref 3.5–5.0)
Alkaline Phosphatase: 60 U/L (ref 38–126)
Bilirubin, Direct: 0.2 mg/dL (ref 0.0–0.2)
Indirect Bilirubin: 0.7 mg/dL (ref 0.3–0.9)
Total Bilirubin: 0.9 mg/dL (ref 0.3–1.2)
Total Protein: 6.1 g/dL — ABNORMAL LOW (ref 6.5–8.1)

## 2019-09-18 LAB — I-STAT ARTERIAL BLOOD GAS, ED
Acid-base deficit: 1 mmol/L (ref 0.0–2.0)
Bicarbonate: 23 mmol/L (ref 20.0–28.0)
Calcium, Ion: 1.09 mmol/L — ABNORMAL LOW (ref 1.15–1.40)
HCT: 50 % (ref 39.0–52.0)
Hemoglobin: 17 g/dL (ref 13.0–17.0)
O2 Saturation: 94 %
Patient temperature: 99.3
Potassium: 5.1 mmol/L (ref 3.5–5.1)
Sodium: 132 mmol/L — ABNORMAL LOW (ref 135–145)
TCO2: 24 mmol/L (ref 22–32)
pCO2 arterial: 35.2 mmHg (ref 32.0–48.0)
pH, Arterial: 7.425 (ref 7.350–7.450)
pO2, Arterial: 71 mmHg — ABNORMAL LOW (ref 83.0–108.0)

## 2019-09-18 LAB — BASIC METABOLIC PANEL
Anion gap: 9 (ref 5–15)
BUN: 25 mg/dL — ABNORMAL HIGH (ref 6–20)
CO2: 25 mmol/L (ref 22–32)
Calcium: 8.2 mg/dL — ABNORMAL LOW (ref 8.9–10.3)
Chloride: 99 mmol/L (ref 98–111)
Creatinine, Ser: 2.29 mg/dL — ABNORMAL HIGH (ref 0.61–1.24)
GFR calc Af Amer: 36 mL/min — ABNORMAL LOW (ref >60)
GFR calc non Af Amer: 31 mL/min — ABNORMAL LOW (ref >60)
Glucose, Bld: 350 mg/dL — ABNORMAL HIGH (ref 70–99)
Potassium: 4.9 mmol/L (ref 3.5–5.1)
Sodium: 133 mmol/L — ABNORMAL LOW (ref 135–145)

## 2019-09-18 LAB — CBC
HCT: 55.2 % — ABNORMAL HIGH (ref 39.0–52.0)
Hemoglobin: 17.2 g/dL — ABNORMAL HIGH (ref 13.0–17.0)
MCH: 27 pg (ref 26.0–34.0)
MCHC: 31.2 g/dL (ref 30.0–36.0)
MCV: 86.5 fL (ref 80.0–100.0)
Platelets: 283 10*3/uL (ref 150–400)
RBC: 6.38 MIL/uL — ABNORMAL HIGH (ref 4.22–5.81)
RDW: 14.5 % (ref 11.5–15.5)
WBC: 8.4 10*3/uL (ref 4.0–10.5)
nRBC: 0 % (ref 0.0–0.2)

## 2019-09-18 LAB — RAPID URINE DRUG SCREEN, HOSP PERFORMED
Amphetamines: NOT DETECTED
Barbiturates: NOT DETECTED
Benzodiazepines: NOT DETECTED
Cocaine: NOT DETECTED
Opiates: NOT DETECTED
Tetrahydrocannabinol: NOT DETECTED

## 2019-09-18 LAB — SARS CORONAVIRUS 2 BY RT PCR (HOSPITAL ORDER, PERFORMED IN ~~LOC~~ HOSPITAL LAB): SARS Coronavirus 2: NEGATIVE

## 2019-09-18 LAB — BRAIN NATRIURETIC PEPTIDE: B Natriuretic Peptide: 553.8 pg/mL — ABNORMAL HIGH (ref 0.0–100.0)

## 2019-09-18 LAB — URINALYSIS, ROUTINE W REFLEX MICROSCOPIC
Bilirubin Urine: NEGATIVE
Glucose, UA: >=500 mg/dL — AB
Ketones, ur: NEGATIVE mg/dL
Leukocytes,Ua: NEGATIVE
Nitrite: NEGATIVE
Protein, ur: >=300 mg/dL — AB
Specific Gravity, Urine: 1.021 (ref 1.005–1.030)
pH: 5 (ref 5.0–8.0)

## 2019-09-18 LAB — CBG MONITORING, ED
Glucose-Capillary: 375 mg/dL — ABNORMAL HIGH (ref 70–99)
Glucose-Capillary: 348 mg/dL — ABNORMAL HIGH (ref 70–99)
Glucose-Capillary: 242 mg/dL — ABNORMAL HIGH (ref 70–99)

## 2019-09-18 LAB — ETHANOL: Alcohol, Ethyl (B): <10 mg/dL (ref <10)

## 2019-09-18 LAB — GLUCOSE, CAPILLARY: Glucose-Capillary: 212 mg/dL — ABNORMAL HIGH (ref 70–99)

## 2019-09-18 MED ORDER — ASPIRIN EC 81 MG PO TBEC
81.0000 mg | DELAYED_RELEASE_TABLET | Freq: Every day | ORAL | Status: DC
  Administered 2019-09-18: 81 mg via ORAL
  Filled 2019-09-18: qty 1

## 2019-09-18 MED ORDER — ATORVASTATIN CALCIUM 80 MG PO TABS
80.0000 mg | ORAL_TABLET | Freq: Every day | ORAL | Status: DC
  Administered 2019-09-18 – 2019-09-20 (×3): 80 mg via ORAL
  Filled 2019-09-18 (×3): qty 1

## 2019-09-18 MED ORDER — SODIUM CHLORIDE 0.9% FLUSH
3.0000 mL | Freq: Once | INTRAVENOUS | Status: AC
  Administered 2019-09-18: 3 mL via INTRAVENOUS

## 2019-09-18 MED ORDER — ENOXAPARIN SODIUM 60 MG/0.6ML ~~LOC~~ SOLN
60.0000 mg | SUBCUTANEOUS | Status: DC
  Administered 2019-09-18 – 2019-09-20 (×3): 60 mg via SUBCUTANEOUS
  Filled 2019-09-18 (×4): qty 0.6

## 2019-09-18 MED ORDER — LEVOTHYROXINE SODIUM 50 MCG PO TABS
50.0000 ug | ORAL_TABLET | Freq: Every day | ORAL | Status: DC
  Administered 2019-09-19 – 2019-09-21 (×3): 50 ug via ORAL
  Filled 2019-09-18 (×3): qty 1

## 2019-09-18 MED ORDER — INSULIN ASPART PROT & ASPART (70-30 MIX) 100 UNIT/ML ~~LOC~~ SUSP
60.0000 [IU] | Freq: Two times a day (BID) | SUBCUTANEOUS | Status: DC
  Administered 2019-09-18 – 2019-09-21 (×7): 60 [IU] via SUBCUTANEOUS
  Filled 2019-09-18 (×2): qty 10

## 2019-09-18 MED ORDER — ACETAMINOPHEN 325 MG PO TABS
650.0000 mg | ORAL_TABLET | ORAL | Status: DC | PRN
  Administered 2019-09-19: 650 mg via ORAL
  Filled 2019-09-18: qty 2

## 2019-09-18 MED ORDER — ACETAMINOPHEN 650 MG RE SUPP: 650.0000 mg | RECTAL | Status: DC | PRN

## 2019-09-18 MED ORDER — GABAPENTIN 100 MG PO CAPS
100.0000 mg | ORAL_CAPSULE | Freq: Three times a day (TID) | ORAL | Status: DC
  Administered 2019-09-18: 100 mg via ORAL
  Filled 2019-09-18: qty 1

## 2019-09-18 MED ORDER — STROKE: EARLY STAGES OF RECOVERY BOOK: Freq: Once | Status: AC

## 2019-09-18 MED ORDER — ASPIRIN 325 MG PO TABS
325.0000 mg | ORAL_TABLET | Freq: Every day | ORAL | Status: DC
  Administered 2019-09-18 – 2019-09-21 (×4): 325 mg via ORAL
  Filled 2019-09-18 (×4): qty 1

## 2019-09-18 MED ORDER — INSULIN DETEMIR 100 UNIT/ML ~~LOC~~ SOLN: 60.0000 [IU] | Freq: Two times a day (BID) | SUBCUTANEOUS | Status: DC

## 2019-09-18 MED ORDER — ACETAMINOPHEN 160 MG/5ML PO SOLN: 650.0000 mg | ORAL | Status: DC | PRN

## 2019-09-18 MED ORDER — INSULIN ASPART 100 UNIT/ML ~~LOC~~ SOLN
0.0000 [IU] | Freq: Three times a day (TID) | SUBCUTANEOUS | Status: DC
  Administered 2019-09-18: 15 [IU] via SUBCUTANEOUS
  Administered 2019-09-18: 7 [IU] via SUBCUTANEOUS
  Administered 2019-09-19: 4 [IU] via SUBCUTANEOUS
  Administered 2019-09-19: 3 [IU] via SUBCUTANEOUS
  Administered 2019-09-19: 4 [IU] via SUBCUTANEOUS
  Administered 2019-09-20 (×3): 3 [IU] via SUBCUTANEOUS
  Administered 2019-09-21 (×2): 4 [IU] via SUBCUTANEOUS

## 2019-09-18 MED ORDER — ASPIRIN 300 MG RE SUPP: 300.0000 mg | Freq: Every day | RECTAL | Status: DC

## 2019-09-18 MED ORDER — LEVOTHYROXINE SODIUM 50 MCG PO TABS
50.0000 ug | ORAL_TABLET | Freq: Every day | ORAL | Status: DC
  Administered 2019-09-18: 50 ug via ORAL
  Filled 2019-09-18: qty 1

## 2019-09-18 MED ORDER — LISINOPRIL 20 MG PO TABS
20.0000 mg | ORAL_TABLET | Freq: Every day | ORAL | Status: DC
  Administered 2019-09-18 – 2019-09-19 (×2): 20 mg via ORAL
  Filled 2019-09-18 (×2): qty 1

## 2019-09-18 MED ORDER — CITALOPRAM HYDROBROMIDE 10 MG PO TABS
20.0000 mg | ORAL_TABLET | Freq: Every day | ORAL | Status: DC
  Administered 2019-09-18 – 2019-09-21 (×4): 20 mg via ORAL
  Filled 2019-09-18 (×4): qty 2

## 2019-09-18 NOTE — ED Notes (Signed)
Pt wife arrived. She says that the patient actually did not pass out, that she brought him here because he has a hard time breathing when he is lying flat. He has also had increased confusion. His blood sugars have been in the 200's at home. Please call her with further questions.

## 2019-09-18 NOTE — Progress Notes (Cosign Needed)
vLTM set up Neurology notifed.  Event button tested same leads as baseline

## 2019-09-18 NOTE — Procedures (Signed)
Patient Name: Daniel [name]  MRN: [id number]  Epilepsy Attending: Lora Havens  Referring Physician/Provider: Dr Roland Rack Date: 09/18/2019  Duration: 20.46 mins  Patient history: This is a 54 year old male presenting to the hospital for initial shortness of breath.  On the hospital wife noted that he has been confused. EEG to evaluate for seizure.   Level of alertness: Awake/ lethargic  AEDs during EEG study: None  Technical aspects: This EEG study was done with scalp electrodes positioned according to the 10-20 International system of electrode placement. Electrical activity was acquired at a sampling rate of 500Hz  and reviewed with a high frequency filter of 70Hz  and a low frequency filter of 1Hz . EEG data were recorded continuously and digitally stored.   Description: The posterior dominant rhythm consists of 8 Hz activity of moderate voltage (25-35 uV) seen predominantly in posterior head regions, symmetric and reactive to eye opening and eye closing. EEG showed intermittent generalized rhythmic 3 to 6 Hz theta-delta slowing. Hyperventilation and photic stimulation were not performed.     ABNORMALITY -Intermittent rhythmic slow, generalized  IMPRESSION: This study is suggestive of mild diffuse encephalopathy, nonspecific etiology. No seizures or epileptiform discharges were seen throughout the recording.  Hagop Mccollam Barbra Sarks

## 2019-09-18 NOTE — Progress Notes (Signed)
Responded to spiritual care consult to support patient.  Patient is doing good and looking forward to finding out what's wrong. Provided emotional and spiritual support. Will follow as needed.   Jaclynn Major, [place], [facility], Pager [phone]

## 2019-09-18 NOTE — Progress Notes (Signed)
Patient is currently on cont. EEG and cannot wear CPAP. RT will continue to monitor.

## 2019-09-18 NOTE — Progress Notes (Signed)
EEG complete - results pending 

## 2019-09-18 NOTE — Consult Note (Signed)
Neurology Consultation  Reason for Consult: Stroke Referring Physician: Lucrezia Starch, MD  CC: Stroke on MRI  History is obtained from: Chart-multiple attempts to contact wife were made and unsuccessful  HPI: Daniel [name] is a 54 y.o. male with history of stroke, seizure, pulmonary embolism, obstructive sleep apnea, migraines, hypertension, hypercholesterolemia, diabetes and chronic kidney disease.  Patient has been seen by neurology in the past.  In September 2009 patient had a seizure while at home secondary to inability to pay for Vimpat and was not on his antiepileptic medication.  In the past patient has also been seen in the hospital secondary to seizures.   During work-up while in the ED MRI did show a punctate infarct thus neurology was consulted for the stroke and to evaluate if this could be part of his confusion.  On this hospitalization patient presented to the emergency department secondary to shortness of breath that began on 09/17/2019.  He stated he noted he has been breathing quicker than normal and had been confused over the last 2 days.  Per notes from ED this morning he did not know where he was and thought his current wife which is his third wife was his first wife.  He also then became verbally abusive towards her.  Per notes she noted that his mental status has declined over the last 2 years in the form of memory loss.  Patient on my examination did not know why he was in the hospital.  He stated that he was at a hotel room, and tried to get a physician to come to his hotel room.  He cannot remember what occurred yesterday.     Seen this before LKW: Unknown as patient is confused tpa given?: no, and known time of stroke, out of window Premorbid modified Rankin scale (mRS): 0  NIHSS 1a Level of Conscious.: 0 1b LOC Questions: 0 1c LOC Commands: 0 2 Best Gaze: 0 3 Visual: 0 4 Facial Palsy: 0 5a Motor Arm - left: 0 5b Motor Arm - Right: 0 6a Motor Leg - Left:  0 6b Motor Leg - Right: 0 7 Limb Ataxia:0  8 Sensory: 0 9 Best Language: 0 10 Dysarthria: 0 11 Extinct. and Inatten.: 0 TOTAL: 0   Past Medical History:  Diagnosis Date   Arthritis    "elbows and wrists" (11/13/2017)   CHF (congestive heart failure) ([place]) 08/2016   CKD stage 3 due to type 2 diabetes mellitus ([place]) 06/2016   Diabetes mellitus type 2 in obese ([facility])    High cholesterol    Hypertension    Hypothyroidism    Left foot drop 04/12/2018   Migraines    Morbid obesity ([place])    On home oxygen therapy    "3L; now only at night" (11/13/2017)   OSA on CPAP    Pneumonia 2017   "double pneumonia"   Pneumonia 10/2017   "while in ICU" (11/13/2017)   Pulmonary embolism ([place])    Seizure ([place])    "10/24/2017-10/28/2017; couldn't get them stopped til medically induced coma. after they brought him out he started having more; can't find RX to stop them" (11/13/2017)   Stroke [facility])    "scans showed several mini strokes in the past; nothing recent" (11/13/2017)    Family History  Problem Relation Age of Onset   Glaucoma Mother    Diabetes Maternal Grandmother    Diabetes Maternal Grandfather    Alpha-1 antitrypsin deficiency Neg Hx    Asthma Neg Hx  Breast cancer Neg Hx    Stroke Neg Hx    Colon cancer Neg Hx    Coronary artery disease Neg Hx    COPD Neg Hx    Cystic fibrosis Neg Hx    Deep vein thrombosis Neg Hx    Hypertension Neg Hx    Hyperlipidemia Neg Hx    Kidney disease Neg Hx    Liver disease Neg Hx    Lung cancer Neg Hx    Lupus Neg Hx    Neurofibromatosis Neg Hx    Osteoporosis Neg Hx    Ovarian cancer Neg Hx    Positive PPD/TB Exposure Neg Hx    Prostate cancer Neg Hx    Pulmonary embolism Neg Hx    Pulmonary fibrosis Neg Hx    Rheum arthritis Neg Hx    Sarcoidosis Neg Hx    Sleep apnea Neg Hx    Thyroid disease Neg Hx    Tuberculosis Neg Hx    Tuberous sclerosis Neg Hx    Social History:   reports that  he has never smoked. He quit smokeless tobacco use about 6 years ago.  His smokeless tobacco use included chew. He reports previous alcohol use. He reports that he does not use drugs.  Medications  Current Facility-Administered Medications:    aspirin EC tablet 81 mg, 81 mg, Oral, Daily, Joy, Shawn C, PA-C, 81 mg at 09/18/19 1208   citalopram (CELEXA) tablet 20 mg, 20 mg, Oral, Daily, Joy, Shawn C, PA-C, 20 mg at 09/18/19 1208   insulin aspart (novoLOG) injection 0-20 Units, 0-20 Units, Subcutaneous, TID WC, Joy, Shawn C, PA-C, 15 Units at 09/18/19 1229   insulin aspart protamine- aspart (NOVOLOG MIX 70/30) injection 60 Units, 60 Units, Subcutaneous, BID WC, Joy, Shawn C, PA-C, 60 Units at 09/18/19 1208   levothyroxine (SYNTHROID) tablet 50 mcg, 50 mcg, Oral, Daily, Joy, Shawn C, PA-C, 50 mcg at 09/18/19 1209   lisinopril (ZESTRIL) tablet 20 mg, 20 mg, Oral, Daily, Joy, Shawn C, PA-C, 20 mg at 09/18/19 1209  Current Outpatient Medications:    acetaminophen (TYLENOL) 325 MG tablet, Take 1-2 tablets (325-650 mg total) by mouth every 4 (four) hours as needed for mild pain., Disp: , Rfl:    albuterol (PROVENTIL HFA) 108 (90 Base) MCG/ACT inhaler, Inhale 1 puff into the lungs every 6 (six) hours as needed for wheezing or shortness of breath. , Disp: , Rfl:    aspirin EC 81 MG tablet, Take 81 mg by mouth daily. , Disp: , Rfl:    citalopram (CELEXA) 20 MG tablet, Take 20 mg by mouth daily., Disp: , Rfl:    gabapentin (NEURONTIN) 100 MG capsule, Take 1 capsule (100 mg total) by mouth 3 (three) times daily., Disp: 90 capsule, Rfl: 0   INSULIN ASPART PROT & ASPART [place], Inject 60 Units into the skin in the morning and at bedtime., Disp: , Rfl:    levothyroxine (SYNTHROID, LEVOTHROID) 50 MCG tablet, Take 1 tablet (50 mcg total) by mouth daily., Disp: 30 tablet, Rfl: 0   lisinopril (ZESTRIL) 20 MG tablet, Take 20 mg by mouth daily., Disp: , Rfl:    NOVOLOG FLEXPEN 100 UNIT/ML FlexPen, Inject  2-14 Units into the skin 3 (three) times daily with meals. INJECT 3 TIMES DAILY WITH MEALS FOR GLUCOSE 121-150 USE 2 UNIT, 151-200 USE 3 UNIT, 201-250 USE 5 UNIT, 251-200 USE 7 UNIT, 301-350 10 UNIT, 351 & UP 14 UNITS, Disp: , Rfl:    insulin detemir (LEVEMIR)  100 UNIT/ML injection, Inject 60 Units into the skin 2 (two) times daily., Disp: , Rfl:    Insulin Pen Needle (CAREFINE PEN NEEDLES) 32G X 6 MM MISC, 1 application by Does not apply route 2 (two) times daily., Disp: 100 each, Rfl: 0  ROS:    General ROS: negative for - chills, fatigue, fever, night sweats, weight gain or weight loss Psychological ROS: Positive for -for aggressive behavior  ophthalmic ROS: negative for - blurry vision, double vision, eye pain or loss of vision ENT ROS: negative for - epistaxis, nasal discharge, oral lesions, sore throat, tinnitus or vertigo Respiratory ROS: negative for - cough, hemoptysis, shortness of breath or wheezing Cardiovascular ROS: negative for - chest pain, dyspnea on exertion, edema or irregular heartbeat Gastrointestinal ROS: negative for - abdominal pain, diarrhea, hematemesis, nausea/vomiting or stool incontinence Genito-Urinary ROS: negative for - dysuria, hematuria, incontinence or urinary frequency/urgency Musculoskeletal ROS: negative for - joint swelling or muscular weakness Neurological ROS: as noted in HPI Dermatological ROS: negative for rash and skin lesion changes  Exam: Current vital signs: BP (!) 140/80 (BP Location: Right Arm)    Pulse 80    Temp 99.3 F (37.4 C) (Oral)    Resp 16    SpO2 95%  Vital signs in last 24 hours: Temp:  [99.3 F (37.4 C)] 99.3 F (37.4 C) (07/28 0611) Pulse Rate:  [80-100] 80 (07/28 1230) Resp:  [16-23] 16 (07/28 1230) BP: (127-140)/(71-100) 140/80 (07/28 1230) SpO2:  [90 %-95 %] 95 % (07/28 1230)   Constitutional: Appears well-developed and well-nourished.  Eyes: No scleral injection HENT: No OP obstrucion Head: Normocephalic.   Cardiovascular: Palpable.  Respiratory: Unlabored GI: Soft.  No distension. There is no tenderness.  Skin: WDI  Neuro: Mental Status: Patient is awake, alert, aware that he is in the hospital however when asked the month he stated "July or October "he did know the year.  He was able to name objects such as my thumb, glove, color of the glove, or watch.  He was able to tell me how many quarters are in $2.25.  He was unable to recall 3 objects.  He was able to follow all instructions that were simple. Cranial Nerves: II: Visual Fields are full.  III,IV, VI: EOMI without ptosis or diploplia. Pupils equal, round and reactive to light V: Facial sensation is symmetric to temperature VII: Facial movement is symmetric--he was holding his left eye closed.  When asked if he is seeing double vision, he states that it is a habit of his to hold his left eye closed and has no problem opening it when asked. VIII: hearing is intact to voice X: Palat elevates symmetrically XI: Shoulder shrug is symmetric. XII: tongue is midline without atrophy or fasciculations.  Motor: Tone is normal. Bulk is normal. 5/5 strength was present in all four extremities.  No drift No aterixis Sensory: Sensation is symmetric to light touch and temperature in the arms and legs. Deep Tendon Reflexes: 2+ and symmetric in the biceps and patellae.  Plantars: Toes are downgoing bilaterally.  Cerebellar: FNF and HKS are intact bilaterally  Labs I have reviewed labs in epic and the results pertinent to this consultation are:   CBC    Component Value Date/Time   WBC 8.4 09/18/2019 0620   RBC 6.38 (H) 09/18/2019 0620   HGB 17.0 09/18/2019 0826   HCT 50.0 09/18/2019 0826   PLT 283 09/18/2019 0620   MCV 86.5 09/18/2019 0620   MCH 27.0 09/18/2019  0620   MCHC 31.2 09/18/2019 0620   RDW 14.5 09/18/2019 0620   LYMPHSABS 1.8 08/18/2019 0820   MONOABS 0.7 08/18/2019 0820   EOSABS 0.0 08/18/2019 0820   BASOSABS 0.0  08/18/2019 0820    CMP     Component Value Date/Time   NA 132 (L) 09/18/2019 0826   K 5.1 09/18/2019 0826   CL 99 09/18/2019 0620   CO2 25 09/18/2019 0620   GLUCOSE 350 (H) 09/18/2019 0620   BUN 25 (H) 09/18/2019 0620   CREATININE 2.29 (H) 09/18/2019 0620   CALCIUM 8.2 (L) 09/18/2019 0620   PROT 6.1 (L) 09/18/2019 0739   ALBUMIN 2.4 (L) 09/18/2019 0739   ALBUMIN 2.7 (L) 10/28/2017 1406   AST 20 09/18/2019 0739   ALT 13 09/18/2019 0739   ALKPHOS 60 09/18/2019 0739   BILITOT 0.9 09/18/2019 0739   GFRNONAA 31 (L) 09/18/2019 0620   GFRAA 36 (L) 09/18/2019 0620    Lipid Panel     Component Value Date/Time   CHOL 311 (H) 08/18/2019 1419   TRIG 324 (H) 08/18/2019 1419   HDL 29 (L) 08/18/2019 1419   CHOLHDL 10.7 08/18/2019 1419   VLDL 65 (H) 08/18/2019 1419   LDLCALC 217 (H) 08/18/2019 1419     Imaging I have reviewed the images obtained:   MRI examination of the brain-6 mm acute infarct within the right temporal parietal white matter, immediately posterior lateral to the right thalamus.  Small chronic lacunar infarcts within bilateral frontal lobe white matter, right basal ganglia and left cerebellum.  The frontal lobe white matter and right basal ganglia lacunar infarcts were not present on prior MRI.  Background mild generalized parenchymal atrophy and progressive mild chronic small vessel ischemic changes within the cerebral white matter.  Etta Quill PA-C [facility] [phone]  M-F  (9:00 am- 5:00 PM)  09/18/2019, 3:01 PM     Assessment:  This is a 54 year old male presenting to the hospital for initial shortness of breath.  On the hospital wife noted that he has been confused.  Thus, MRI was obtained and a punctate infarct was noted.  This infarct is a incidental finding and not the cause of patient's confusion.  However given patient's multiple stroke risk factors he will obtain a stroke work-up.  Impression: -Stroke  Recommend -MRA Head   -Carotid duplex -Transthoracic Echo, -Start patient on ASA 325mg  daily  -Start or continue Atorvastatin 80 mg/other high intensity statin -BP goal: permissive HTN upto  systolic less than 824 -HBAIC and Lipid profile -Telemetry monitoring -Frequent neuro checks -NPO until passes stroke swallow screen -PT/OT # please page stroke NP  Or  PA  Or MD from 8am -4 pm  as this patient from this time will be  followed by the stroke.   You can look them up on [email or web address]  Password [email or web address]

## 2019-09-18 NOTE — Progress Notes (Signed)
Patient arrived to unit, verified on telemetry, seizure precautions initiated

## 2019-09-18 NOTE — Progress Notes (Signed)
ABG ordered and obtained while patient was on room air.  No further actions at this time.   Ref. Range 09/18/2019 08:26  Sample type Unknown ARTERIAL  pH, Arterial Latest Ref Range: 7.35 - 7.45  7.425  pCO2 arterial Latest Ref Range: 32 - 48 mmHg 35.2  pO2, Arterial Latest Ref Range: 83 - 108 mmHg 71 (L)  TCO2 Latest Ref Range: 22 - 32 mmol/L 24  Acid-base deficit Latest Ref Range: 0.0 - 2.0 mmol/L 1.0  Bicarbonate Latest Ref Range: 20.0 - 28.0 mmol/L 23.0  O2 Saturation Latest Units: % 94.0  Patient temperature Unknown 99.3 F  Collection site Unknown Radial
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
## 2019-09-18 NOTE — ED Triage Notes (Signed)
Pt says that he has "been passing out". Reports that he had a second episode tonight and his wife brought him here. Pt checked in with high sugars have "been good, 80-85" Attempted to call wife, left message to call back for more details about the incidents.

## 2019-09-18 NOTE — ED Notes (Signed)
Pt transported to MRI 

## 2019-09-18 NOTE — ED Notes (Signed)
Pt going back to MRI
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
## 2019-09-18 NOTE — H&P (Addendum)
History and Physical    Daniel [name] QTM:[id number] DOB: 1965-07-22 DOA: 09/18/2019  Referring MD/NP/PA: Arlean Hopping, PA-C PCP: Antonietta Jewel, MD  Patient coming from: Home  Chief Complaint: kept having episodes  I have personally briefly reviewed patient's old medical records in [place]   HPI: Daniel [name] is a 54 y.o. male with medical history significant of hypertension, hyperlipidemia, hypothyroidism, CVA, diabetes mellitus type 2, seizure, PE, morbid obesity, OSA on CPAP, chronic kidney disease stage IIIb presented after having episodes of shortness of breath.  History is obtained mostly by review of records as patient is acutely confused.  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  However, review of records notes that patient has been out of work for the last 3 years and has had mental status decline over the last 2 years in the form of memory loss per his wife.  Acutely over the last 2 days or so he was noted to have a mental status change and become verbally abusive towards her which was unusual.  There was no seizure-like activity reported by his wife, but he was having episodes of shortness of breath where he would sit up forward gasping for air.  Currently the patient denies any complaints at this time.  ED Course: Upon admission into the emergency department patient was seen to be afebrile with vital signs relatively maintained.  Labs significant for hemoglobin 17.3, sodium 133, BUN 25, creatinine 2.29, and BNP 553.8.  Chest x-ray was otherwise not clear.  Urinalysis noted rare bacteria, 11-20 WBCs, and 6-10 WBCs.  COVID-19 screening negative.  Urine drug screen was negative for any acute abnormality MRI positive for a 6 mm acute infarct of the right temporoparietal white matter.  Neurology had been formally consulted.  TRH called to admit.  Review of Systems  Unable to  perform ROS: Mental status change  Constitutional: Negative for fever.    Past Medical History:  Diagnosis Date  . Arthritis    "elbows and wrists" (11/13/2017)  . CHF (congestive heart failure) ([place]) 08/2016  . CKD stage 3 due to type 2 diabetes mellitus ([place]) 06/2016  . Diabetes mellitus type 2 in obese ([place])   . High cholesterol   . Hypertension   . Hypothyroidism   . Left foot drop 04/12/2018  . Migraines   . Morbid obesity ([place])   . On home oxygen therapy    "3L; now only at night" (11/13/2017)  . OSA on CPAP   . Pneumonia 2017   "double pneumonia"  . Pneumonia 10/2017   "while in ICU" (11/13/2017)  . Pulmonary embolism ([place])   . Seizure ([place])    "10/24/2017-10/28/2017; couldn't get them stopped til medically induced coma. after they brought him out he started having more; can't find RX to stop them" (11/13/2017)  . Stroke [facility])    "scans showed several mini strokes in the past; nothing recent" (11/13/2017)    Past Surgical History:  Procedure Laterality Date  . CARDIAC CATHETERIZATION  12/2016     reports that he has never smoked. He quit smokeless tobacco use about 6 years ago.  His smokeless tobacco use included chew. He reports previous alcohol use. He reports that he does not use drugs.  Allergies  Allergen Reactions  . Montelukast Other (See Comments)    Possibly cause headaches per spouse    Family History  Problem Relation Age of Onset  . Glaucoma Mother   . Diabetes Maternal Grandmother   . Diabetes Maternal Grandfather   . Alpha-1 antitrypsin deficiency Neg Hx   . Asthma Neg Hx   . Breast cancer Neg Hx   . Stroke Neg Hx   . Colon cancer Neg Hx   . Coronary artery disease Neg Hx   . COPD Neg Hx   . Cystic fibrosis Neg Hx   . Deep vein thrombosis Neg Hx   . Hypertension Neg Hx   . Hyperlipidemia Neg Hx   . Kidney disease Neg Hx   . Liver disease Neg Hx   . Lung cancer Neg Hx   . Lupus Neg Hx   . Neurofibromatosis Neg Hx   . Osteoporosis Neg Hx   .  Ovarian cancer Neg Hx   . Positive PPD/TB Exposure Neg Hx   . Prostate cancer Neg Hx   . Pulmonary embolism Neg Hx   . Pulmonary fibrosis Neg Hx   . Rheum arthritis Neg Hx   . Sarcoidosis Neg Hx   . Sleep apnea Neg Hx   . Thyroid disease Neg Hx   . Tuberculosis Neg Hx   . Tuberous sclerosis Neg Hx     Prior to Admission medications   Medication Sig Start Date End Date Taking? Authorizing Provider  acetaminophen (TYLENOL) 325 MG tablet Take 1-2 tablets (325-650 mg total) by mouth every 4 (four) hours as needed for mild pain. 11/29/17  Yes Love, Ivan Anchors, PA-C  albuterol (PROVENTIL HFA) 108 (90 Base) MCG/ACT inhaler Inhale 1 puff into the lungs every 6 (six) hours as needed for wheezing or shortness of breath.    Yes [provider]  aspirin EC 81 MG tablet Take 81 mg by mouth daily.    Yes [provider]  citalopram (CELEXA) 20 MG tablet Take 20 mg by mouth daily.   Yes [provider]  gabapentin (NEURONTIN) 100 MG capsule Take 1 capsule (100 mg total) by mouth 3 (three) times daily. 12/01/17  Yes Love, Ivan Anchors, PA-C  INSULIN ASPART PROT & ASPART [place] Inject 60 Units into the skin in the morning and at bedtime.   Yes [provider]  levothyroxine (SYNTHROID, LEVOTHROID) 50 MCG tablet Take 1 tablet (50 mcg total) by mouth daily. 12/01/17  Yes Love, Ivan Anchors, PA-C  lisinopril (ZESTRIL) 20 MG tablet Take 20 mg by mouth daily.   Yes [provider]  NOVOLOG FLEXPEN 100 UNIT/ML FlexPen Inject 2-14 Units into the skin 3 (three) times daily with meals. INJECT 3 TIMES DAILY WITH MEALS FOR GLUCOSE 121-150 USE 2 UNIT, 151-200 USE 3 UNIT, 201-250 USE 5 UNIT, 251-200 USE 7 UNIT, 301-350 10 UNIT, 351 & UP 14 UNITS 08/19/19  Yes [provider]  insulin detemir (LEVEMIR) 100 UNIT/ML injection Inject 60 Units into the skin 2 (two) times daily.    [provider]  Insulin Pen Needle (CAREFINE PEN NEEDLES) 32G X 6 MM MISC 1 application by Does not  apply route 2 (two) times daily. 12/01/17   Bary Leriche, PA-C    Physical Exam:  Constitutional: Morbidly obese male currently in no acute distress Vitals:   09/18/19 0756 09/18/19 0937 09/18/19 1005 09/18/19 1230  BP: (!) 133/100   (!) 140/80  Pulse: 82 100 83 80  Resp: 23  23 16   Temp:      TempSrc:      SpO2: 95% 90% 95% 95%   Eyes:  PERRL, lids and conjunctivae normal ENMT: Mucous membranes are moist. Posterior pharynx clear of any exudate or lesions.  Neck: normal, supple, no masses, no thyromegaly Respiratory: clear to auscultation bilaterally, no wheezing, no crackles. Normal respiratory effort. No accessory muscle use.  Cardiovascular: Regular rate and rhythm, no murmurs / rubs / gallops.  Trace bilateral lower extremity edema. 2+ pedal pulses. No carotid bruits.  Abdomen: no tenderness, no masses palpated. No hepatosplenomegaly. Bowel sounds positive.  Musculoskeletal: no clubbing / cyanosis. No joint deformity upper and lower extremities. Good ROM, no contractures. Normal muscle tone.  Skin: no rashes, lesions, ulcers. No induration Neurologic: CN 2-12 grossly intact. Sensation intact, DTR normal. Strength 5/5 in all 4.  Psychiatric: Confused. alert and oriented x person and place.  Not oriented to time and circumstance   Labs on Admission: I have personally reviewed following labs and imaging studies  CBC: Recent Labs  Lab 09/18/19 0620 09/18/19 0826  WBC 8.4  --   HGB 17.2* 17.0  HCT 55.2* 50.0  MCV 86.5  --   PLT 283  --    Basic Metabolic Panel: Recent Labs  Lab 09/18/19 0620 09/18/19 0826  NA 133* 132*  K 4.9 5.1  CL 99  --   CO2 25  --   GLUCOSE 350*  --   BUN 25*  --   CREATININE 2.29*  --   CALCIUM 8.2*  --    GFR: CrCl cannot be calculated (Unknown ideal weight.). Liver Function Tests: Recent Labs  Lab 09/18/19 0739  AST 20  ALT 13  ALKPHOS 60  BILITOT 0.9  PROT 6.1*  ALBUMIN 2.4*   No results for input(s): LIPASE, AMYLASE in the  last 168 hours. No results for input(s): AMMONIA in the last 168 hours. Coagulation Profile: No results for input(s): INR, PROTIME in the last 168 hours. Cardiac Enzymes: No results for input(s): CKTOTAL, CKMB, CKMBINDEX, TROPONINI in the last 168 hours. BNP (last 3 results) No results for input(s): PROBNP in the last 8760 hours. HbA1C: No results for input(s): HGBA1C in the last 72 hours. CBG: Recent Labs  Lab 09/18/19 0606 09/18/19 1214  GLUCAP 375* 348*   Lipid Profile: No results for input(s): CHOL, HDL, LDLCALC, TRIG, CHOLHDL, LDLDIRECT in the last 72 hours. Thyroid Function Tests: No results for input(s): TSH, T4TOTAL, FREET4, T3FREE, THYROIDAB in the last 72 hours. Anemia Panel: No results for input(s): VITAMINB12, FOLATE, FERRITIN, TIBC, IRON, RETICCTPCT in the last 72 hours. Urine analysis:    Component Value Date/Time   COLORURINE YELLOW 09/18/2019 0614   APPEARANCEUR HAZY (A) 09/18/2019 0614   LABSPEC 1.021 09/18/2019 0614   PHURINE 5.0 09/18/2019 0614   GLUCOSEU >=500 (A) 09/18/2019 0614   HGBUR SMALL (A) 09/18/2019 0614   BILIRUBINUR NEGATIVE 09/18/2019 0614   KETONESUR NEGATIVE 09/18/2019 0614   PROTEINUR >=300 (A) 09/18/2019 0614   NITRITE NEGATIVE 09/18/2019 0614   LEUKOCYTESUR NEGATIVE 09/18/2019 0614   Sepsis Labs: Recent Results (from the past 240 hour(s))  SARS Coronavirus 2 by RT PCR (hospital order, performed in [facility] hospital lab) Nasopharyngeal Nasopharyngeal Swab     Status: None   Collection Time: 09/18/19  8:50 AM   Specimen: Nasopharyngeal Swab  Result Value Ref Range Status   SARS Coronavirus 2 NEGATIVE NEGATIVE Final    Comment: (NOTE) SARS-CoV-2 target nucleic acids are NOT DETECTED.  The SARS-CoV-2 RNA is generally detectable in upper and lower respiratory specimens during the acute phase of infection. The lowest concentration of SARS-CoV-2  viral copies this assay can detect is 250 copies / mL. A negative result does not  preclude SARS-CoV-2 infection and should not be used as the sole basis for treatment or other patient management decisions.  A negative result may occur with improper specimen collection / handling, submission of specimen other than nasopharyngeal swab, presence of viral mutation(s) within the areas targeted by this assay, and inadequate number of viral copies (<250 copies / mL). A negative result must be combined with clinical observations, patient history, and epidemiological information.  Fact Sheet for Patients:   StrictlyIdeas.no  Fact Sheet for Healthcare Providers: BankingDealers.co.za  This test is not yet approved or  cleared by the [place] FDA and has been authorized for detection and/or diagnosis of SARS-CoV-2 by FDA under an Emergency Use Authorization (EUA).  This EUA will remain in effect (meaning this test can be used) for the duration of the COVID-19 declaration under Section 564(b)(1) of the Act, 21 U.S.C. section 360bbb-3(b)(1), unless the authorization is terminated or revoked sooner.  Performed at [facility], [place] [address] [place], [place] [address]      Radiological Exams on Admission: DG Skull 1-3 Views  Result Date: 09/18/2019 CLINICAL DATA:  Screening for MRI. EXAM: SKULL - 1-3 VIEW COMPARISON:  None. FINDINGS: No metallic foreign body. No fracture or bone lesion. Clear sinuses. Soft tissues are unremarkable. IMPRESSION: Negative.  No metal foreign body. Electronically Signed   By: Lajean Manes M.D.   On: 09/18/2019 11:33   DG Neck Soft Tissue  Result Date: 09/18/2019 CLINICAL DATA:  Screening for MRI. EXAM: NECK SOFT TISSUES - 1+ VIEW COMPARISON:  None. FINDINGS: No metallic foreign body. Normal soft tissues.  Airway is widely patent. Mild disc degenerative changes at C5-C6 and C6-C7 with mild loss of disc height and endplate spurring. No skeletal abnormality. IMPRESSION: 1. No metallic foreign  body.  No acute finding. Electronically Signed   By: Lajean Manes M.D.   On: 09/18/2019 11:35   DG Chest 2 View  Result Date: 09/18/2019 CLINICAL DATA:  Syncope EXAM: CHEST - 2 VIEW COMPARISON:  August 18, 2019 FINDINGS: Lungs are clear. Heart is upper normal in size with pulmonary vascularity normal. No evident adenopathy. There is degenerative change in the lower thoracic and upper lumbar regions. IMPRESSION: Lungs clear. Heart upper normal in size. No adenopathy appreciable. Electronically Signed   By: Lowella Grip III M.D.   On: 09/18/2019 08:21   DG Pelvis 1-2 Views  Result Date: 09/18/2019 CLINICAL DATA:  Screening for MRI. EXAM: PELVIS - 1-2 VIEW COMPARISON:  12/09/2017 FINDINGS: No fracture or bone lesion. The SI joints, pubic symphysis and hip joints are normally spaced and aligned. Degenerative disc changes are noted of the lower lumbar spine. Soft tissues are unremarkable. No metallic foreign bodies. IMPRESSION: 1. No metallic foreign bodies. 2. No fracture, bone lesion or joint abnormality of the pelvis or hips. Electronically Signed   By: Lajean Manes M.D.   On: 09/18/2019 11:33   DG Abd 1 View  Result Date: 09/18/2019 CLINICAL DATA:  Screening for MRI. EXAM: ABDOMEN - 1 VIEW COMPARISON:  10/28/2017 FINDINGS: No metallic foreign body. Normal bowel gas pattern. No evidence of a soft tissue mass or organomegaly. No evidence of renal or ureteral stones. IMPRESSION: 1. No metallic foreign body. 2. No acute findings. Electronically Signed   By: Lajean Manes M.D.   On: 09/18/2019 11:35   MR BRAIN WO CONTRAST  Result Date: 09/18/2019 CLINICAL DATA:  Provided history: Mental status change, unknown cause. EXAM: MRI HEAD WITHOUT CONTRAST TECHNIQUE: Multiplanar, multiecho pulse sequences of the brain and surrounding structures were obtained without intravenous contrast. COMPARISON:  Head CT 08/18/2019, brain MRI 10/25/2017 FINDINGS: Brain: Mild generalized parenchymal atrophy. The examination  is intermittently motion degraded, limiting evaluation. Most notably, there is moderate/severe motion degradation of the axial SWI sequence. 6 mm focus of restricted diffusion consistent with acute infarction within the right temporoparietal white matter, immediately posterolateral to the right thalamus (series 2, image 27). Mild T2 shine through at site of chronic lacunar infarcts within the frontal lobe white matter bilaterally (series 2, image 37). Small chronic lacunar infarcts within the cerebral white matter and right basal ganglia are new as compared to the prior MRI of 10/25/2017. Redemonstrated small chronic lacunar infarcts within the inferior left cerebellum. Background mild patchy T2/FLAIR hyperintensity within the cerebral white matter is nonspecific, but consistent with chronic small vessel ischemic disease. These findings have progressed as compared to the prior MRI. No evidence of intracranial mass. No definite chronic intracranial blood products are identified on the significantly motion degraded SWI sequence. No extra-axial fluid collection. No midline shift. Vascular: Expected proximal arterial flow voids. Skull and upper cervical spine: No focal marrow lesion. Sinuses/Orbits: Visualized orbits show no acute finding. Paranasal sinus disease. Most notably, there is moderate mucosal thickening within the ethmoid, sphenoid and left maxillary sinuses. Trace fluid within right mastoid air cells. IMPRESSION: Motion degraded examination as described. 6 mm acute infarct within the right temporoparietal white matter, immediately posterolateral to the right thalamus. Small chronic lacunar infarcts within the bilateral frontal lobe white matter, right basal ganglia and left cerebellum. The frontal lobe white matter and right basal ganglia lacunar infarcts were not present on the prior MRI of 10/25/2017. Background mild generalized parenchymal atrophy and progressive mild chronic small vessel ischemic changes  within the cerebral white matter. Pansinusitis, most notably ethmoid, sphenoid and left maxillary. Trace right mastoid effusion. Electronically Signed   By: Kellie Simmering DO   On: 09/18/2019 12:36    EKG: Independently reviewed.  Normal sinus rhythm at 88 bpm  Assessment/Plan CVA: Acute.  Patient presented after poorly over the last 2 days being more confused with intermittent complaints of shortness of breath found to have 6 mm acute infarct of the right temporoparietal white matter on MRI. -Admit to telemetry bed -Stroke order set initiated -Neuro checks -Check  MRI/MRA head w/o contrast  -Carotid Doppler ultrasound -PT/OT/Speech to eval and treat -Check echocardiogram -Check Hemoglobin A1c and lipid panel in a.m. -ASA -Appreciate neurology consultative services, will follow-up for further recommendation  Acute metabolic encephalopathy: Acute.  Patient aggressive and not acting like his normal self to his wife.  Urine drug screen was negative for any acute abnormalities.  ABG within normal limits.  Unclear if recent stroke as a cause for his recent change in mental status.  Patient with prior history of seizures. -Continue to monitor may warrant further work-up  Elevated BNP: Acute.  BNP elevated at 553, but chest x-ray otherwise clear.  Patient with some trace to 1+ lower extremity edema, but does not appear acutely fluid overloaded.  No clear cause for patient's dyspnea complaints. -Strict intake and output -Daily weight -Follow-up echo cardiac  Diabetes mellitus type 2 with peripheral neuropathy: On admission patient's blood glucose is initially elevated up to 350.   Patient with a pseudohyponatremia given correction for glucose.  Last hemoglobin A1c> 15.5 on 6/27. Home regimen reported as NovoLog Mix  70/30 60 units 2 times daily with meals and NovoLog sliding scale. -Hypoglycemic protocols -Continue NovoLog 70/30 6 units 2 times daily with meals -Resistant sliding scale with  meals  Chronic kidney disease stage IIIb: Creatinine noted to be 2.29 which appears to be similar to previous hospitalization last month. -Continue to monitor  Essential hypertension: Blood pressures currently maintained.  Home blood pressure medications include lisinopril 20 mg daily.  Patient was recommended holding furosemide during last discharge. -Continue lisinopril  Abnormal UA: Positive for rare bacteria, 11-20 WBCs, and 6-10 WBCs.  Not really -Follow-up urine culture   Hypothyroidism: TSH noted to be 2.702 on 6/27. -Goal LDL less than 100 -Continue levothyroxine  Depression -Continue citalopram  Hyperlipidemia: Total cholesterol 311, HDL 29, LDL 217, triglycerides 324 on 08/18/2019. -Atorvastatin 80 mg daily  Obstructive sleep apnea, morbid obesity: BMI previously done to be 42.4 kg/m -Continue CPAP nightly  DVT prophylaxis: Lovenox Code Status: Full Family Communication: Unable to reach wife by phone Disposition Plan: To be determined Consults called: Neurology Admission status: Inpatient   Norval Morton MD [facility] Pager [phone]   If 7PM-7AM, please contact night-coverage [email or web address] Password [facility]  09/18/2019, 3:03 PM

## 2019-09-18 NOTE — ED Notes (Signed)
Report called to 3W 02

## 2019-09-19 ENCOUNTER — Inpatient Hospital Stay (HOSPITAL_COMMUNITY): Payer: Medicare Other

## 2019-09-19 ENCOUNTER — Encounter (HOSPITAL_COMMUNITY): Payer: Medicare Other

## 2019-09-19 DIAGNOSIS — I6389 Other cerebral infarction: Secondary | ICD-10-CM

## 2019-09-19 DIAGNOSIS — I63311 Cerebral infarction due to thrombosis of right middle cerebral artery: Secondary | ICD-10-CM

## 2019-09-19 LAB — CBC WITH DIFFERENTIAL/PLATELET
Abs Immature Granulocytes: 0.06 10*3/uL (ref 0.00–0.07)
Basophils Absolute: 0 10*3/uL (ref 0.0–0.1)
Basophils Relative: 0 %
Eosinophils Absolute: 0 10*3/uL (ref 0.0–0.5)
Eosinophils Relative: 0 %
HCT: 48.9 % (ref 39.0–52.0)
Hemoglobin: 15.6 g/dL (ref 13.0–17.0)
Immature Granulocytes: 1 %
Lymphocytes Relative: 6 %
Lymphs Abs: 0.8 10*3/uL (ref 0.7–4.0)
MCH: 27 pg (ref 26.0–34.0)
MCHC: 31.9 g/dL (ref 30.0–36.0)
MCV: 84.7 fL (ref 80.0–100.0)
Monocytes Absolute: 1.1 10*3/uL — ABNORMAL HIGH (ref 0.1–1.0)
Monocytes Relative: 9 %
Neutro Abs: 10.3 10*3/uL — ABNORMAL HIGH (ref 1.7–7.7)
Neutrophils Relative %: 84 %
Platelets: 227 10*3/uL (ref 150–400)
RBC: 5.77 MIL/uL (ref 4.22–5.81)
RDW: 14.5 % (ref 11.5–15.5)
WBC: 12.3 10*3/uL — ABNORMAL HIGH (ref 4.0–10.5)
nRBC: 0 % (ref 0.0–0.2)

## 2019-09-19 LAB — URINE CULTURE: Culture: NO GROWTH

## 2019-09-19 LAB — ECHOCARDIOGRAM COMPLETE
S' Lateral: 3.4 cm
Weight: 4737.24 oz

## 2019-09-19 LAB — BASIC METABOLIC PANEL
Anion gap: 10 (ref 5–15)
BUN: 37 mg/dL — ABNORMAL HIGH (ref 6–20)
CO2: 23 mmol/L (ref 22–32)
Calcium: 7.9 mg/dL — ABNORMAL LOW (ref 8.9–10.3)
Chloride: 103 mmol/L (ref 98–111)
Creatinine, Ser: 2.93 mg/dL — ABNORMAL HIGH (ref 0.61–1.24)
GFR calc Af Amer: 27 mL/min — ABNORMAL LOW (ref >60)
GFR calc non Af Amer: 23 mL/min — ABNORMAL LOW (ref >60)
Glucose, Bld: 167 mg/dL — ABNORMAL HIGH (ref 70–99)
Potassium: 4.6 mmol/L (ref 3.5–5.1)
Sodium: 136 mmol/L (ref 135–145)

## 2019-09-19 LAB — GLUCOSE, CAPILLARY
Glucose-Capillary: 136 mg/dL — ABNORMAL HIGH (ref 70–99)
Glucose-Capillary: 173 mg/dL — ABNORMAL HIGH (ref 70–99)
Glucose-Capillary: 175 mg/dL — ABNORMAL HIGH (ref 70–99)
Glucose-Capillary: 139 mg/dL — ABNORMAL HIGH (ref 70–99)
Glucose-Capillary: 103 mg/dL — ABNORMAL HIGH (ref 70–99)

## 2019-09-19 LAB — HEMOGLOBIN A1C
Hgb A1c MFr Bld: 13.3 % — ABNORMAL HIGH (ref 4.8–5.6)
Mean Plasma Glucose: 335.01 mg/dL

## 2019-09-19 LAB — CREATININE, URINE, RANDOM: Creatinine, Urine: 171.8 mg/dL

## 2019-09-19 LAB — SODIUM, URINE, RANDOM: Sodium, Ur: 33 mmol/L

## 2019-09-19 MED ORDER — IPRATROPIUM-ALBUTEROL 0.5-2.5 (3) MG/3ML IN SOLN
RESPIRATORY_TRACT | Status: AC
  Administered 2019-09-19: 3 mL
  Filled 2019-09-19: qty 3

## 2019-09-19 MED ORDER — FUROSEMIDE 10 MG/ML IJ SOLN
40.0000 mg | Freq: Once | INTRAMUSCULAR | Status: AC
  Administered 2019-09-19: 40 mg via INTRAVENOUS
  Filled 2019-09-19: qty 4

## 2019-09-19 MED ORDER — PERFLUTREN LIPID MICROSPHERE
1.0000 mL | INTRAVENOUS | Status: AC | PRN
  Administered 2019-09-19: 3 mL via INTRAVENOUS
  Filled 2019-09-19: qty 10

## 2019-09-19 MED ORDER — CLOPIDOGREL BISULFATE 75 MG PO TABS
75.0000 mg | ORAL_TABLET | Freq: Every day | ORAL | Status: DC
  Administered 2019-09-19 – 2019-09-21 (×3): 75 mg via ORAL
  Filled 2019-09-19 (×3): qty 1

## 2019-09-19 MED ORDER — METOPROLOL TARTRATE 12.5 MG HALF TABLET
12.5000 mg | ORAL_TABLET | Freq: Two times a day (BID) | ORAL | Status: DC
  Administered 2019-09-19 – 2019-09-21 (×4): 12.5 mg via ORAL
  Filled 2019-09-19 (×4): qty 1

## 2019-09-19 NOTE — Significant Event (Signed)
HOSPITAL MEDICINE OVERNIGHT EVENT NOTE  Called by nursing due to patient becoming visibly more short of breath throughout the evening including progressively worsening tachypnea and increasing work of breathing.  Patient's oxygen requirements are increased throughout the night and is currently requiring 4 L of oxygen via nasal cannula, saturating in the low 90s.  I evaluated the patient at the bedside.  Patient is lethargic and oriented x1.  Patient does follow commands.  Breath sounds exhibit intermittent expiratory wheezing with prolonged expiratory phase.  Patient is exhibiting some accessory muscle use.  Bibasilar rales additionally noted.  Obtaining stat EKG which reveals sinus tachycardia.  Obtaining stat chest x-ray.  Stat ABG on 4 L of supplemental oxygen revealing pH of 7.33 PCO2 44 and PO2 of 71, shocking result in comparison to the patient's appearance.  Respiratory is providing nebulized bronchodilator therapy and placing patient on BiPAP simply for work of breathing due to significant accessory muscle use.  Patient will need to be monitored closely throughout the morning for clinical improvement.  Day provider need to follow-up on chest x-ray results.  Daniel [name]

## 2019-09-19 NOTE — Evaluation (Signed)
Physical Therapy Evaluation Patient Details Name: Daniel [name] MRN: [id number] DOB: [birth year] Today's Date: 09/19/2019   History of Present Illness  Pt is 54 yo male with PMH of HTN, HLD, hypothyrooidism, CVA, DM2, seizure, morbid obesity, OSA, kidney disease stage III who presented to ED with [facility]. Per notes pt has had declining mental status over last 2 years in regards to memory loss, however, acutely worsened over past 2 days and became verbally abusive which is abnormal.  Pt was found to have acute infarct 6 mm in R temporoparietal white matter and acute metabolic encephalopathy. Pt had rapid response earlier this morning due to [facility], but has stabilized and cleared for PT/OT per RN.  Clinical Impression   Pt admitted with above diagnosis. Pt presenting with confusion, decreased safety awareness, and impulsivity.  He ambulated 100' without AD but had 1 LOB requiring min A.  Pt required min A for initial stand but otherwise min guard.  He had DOE but with stable O2 sats.   Pt demonstrated good strength in bil LE.  Pt is questionable historian and no family present.  If he has 24 hr supervision could return home, but if not then would need SNF due to poor safety.  Pt currently with functional limitations due to the deficits listed below (see PT Problem List). Pt will benefit from skilled PT to increase their independence and safety with mobility to allow discharge to the venue listed below.       Follow Up Recommendations Home health PT;Supervision/Assistance - 24 hour (if wife unable to provide 24 hr supv/assist then recommend SNF)    Equipment Recommendations  None recommended by PT (per chart has RW)    Recommendations for Other Services       Precautions / Restrictions Precautions Precautions: Fall      Mobility  Bed Mobility Overal bed mobility: Needs Assistance Bed Mobility: Supine to Sit     Supine to sit: Min guard     General bed mobility comments: min guard for  safety  Transfers Overall transfer level: Needs assistance Equipment used: None Transfers: Sit to/from Stand Sit to Stand: Min guard;Min assist         General transfer comment: On first stand required min A for initial steadying and sat abruptly on bed due to "back spasm"; performed sit to stand 3 more times without assistance, min guard for safety  Ambulation/Gait Ambulation/Gait assistance: Min assist Gait Distance (Feet): 100 Feet Assistive device: None Gait Pattern/deviations: Wide base of support;Step-through pattern Gait velocity: normal   General Gait Details: Mild impulsivity; cues for controlled speed; had 1 LOB forward requring min A for steadying - otherwise min guard  Stairs            Wheelchair Mobility    Modified Rankin (Stroke Patients Only) Modified Rankin (Stroke Patients Only) Pre-Morbid Rankin Score: No significant disability Modified Rankin: Moderate disability     Balance Overall balance assessment: Needs assistance Sitting-balance support: No upper extremity supported;Feet supported Sitting balance-Leahy Scale: Fair Sitting balance - Comments: Could maintain static balance but leaned posteriorly with donning socks and LE MMT, had 1 LOB posteeriorly - reports back spasm   Standing balance support: No upper extremity supported Standing balance-Leahy Scale: Fair Standing balance comment: initially requiring min A for steadying but progressed to min guard for safety  Pertinent Vitals/Pain Pain Assessment: Faces Faces Pain Scale: Hurts a little bit Pain Location: Reports one episode of back spasm that resolved    Home Living Family/patient expects to be discharged to:: Unsure Living Arrangements: Spouse/significant other Available Help at Discharge: Family;Available PRN/intermittently Type of Home: House Home Access: Stairs to enter Entrance Stairs-Rails: Psychiatric nurse of Steps:  6 Home Layout: One level Home Equipment: Walker - 2 wheels;Bedside commode;Shower seat;Hand held shower head (per prior admission) Additional Comments: Pt is questionable historian but home situation does agree with prior visits    Prior Function           Comments: Pt reports independent with ADLs, IADLs, and ambulation without AD but is questionable historain.  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     Hand Dominance   Dominant Hand: Right    Extremity/Trunk Assessment   Upper Extremity Assessment Upper Extremity Assessment: Defer to OT evaluation    Lower Extremity Assessment Lower Extremity Assessment: Overall WFL for tasks assessed    Cervical / Trunk Assessment Cervical / Trunk Assessment: Normal  Communication   Communication: No difficulties  Cognition Arousal/Alertness: Awake/alert Behavior During Therapy: Impulsive Overall Cognitive Status: No family/caregiver present to determine baseline cognitive functioning Area of Impairment: Orientation;Problem solving;Safety/judgement;Following commands;Memory;Awareness                 Orientation Level: Disoriented to;Place;Time;Situation (stated 2001, unaware of COVID pandemic; states he is in doctors office)   Memory: Decreased short-term memory;Decreased recall of precautions Following Commands: Follows one step commands consistently Safety/Judgement: Decreased awareness of safety;Decreased awareness of deficits     General Comments: Pt impulsive at times; did make occasional inappropriate comment      General Comments  Pt was on RA with O2 sats 93-97%; HR low 100's throughout session    Exercises     Assessment/Plan    PT Assessment Patient needs continued PT services  PT Problem List Decreased mobility;Decreased safety awareness;Decreased coordination;Decreased knowledge of precautions;Decreased activity tolerance;Cardiopulmonary status limiting  activity;Decreased balance;Decreased knowledge of use of DME;Decreased cognition       PT Treatment Interventions DME instruction;Therapeutic activities;Gait training;Therapeutic exercise;Patient/family education;Cognitive remediation;Stair training;Balance training;Functional mobility training;Neuromuscular re-education    PT Goals (Current goals can be found in the Care Plan section)  Acute Rehab PT Goals PT Goal Formulation: Patient unable to participate in goal setting Time For Goal Achievement: 10/03/19 Potential to Achieve Goals: Fair    Frequency Min 4X/week   Barriers to discharge   unsure level of support - no family presnet    Co-evaluation PT/OT/SLP Co-Evaluation/Treatment: Yes Reason for Co-Treatment: For patient/therapist safety (reports pt had made inappropriate comments and impulsive) PT goals addressed during session: Mobility/safety with mobility OT goals addressed during session: ADL's and self-care       AM-PAC PT "6 Clicks" Mobility  Outcome Measure Help needed turning from your back to your side while in a flat bed without using bedrails?: A Little Help needed moving from lying on your back to sitting on the side of a flat bed without using bedrails?: A Little Help needed moving to and from a bed to a chair (including a wheelchair)?: A Little Help needed standing up from a chair using your arms (e.g., wheelchair or bedside chair)?: A Little Help needed to walk in hospital room?: A Little Help needed climbing 3-5 steps with a railing? : A Little 6 Click Score: 18    End of Session  Equipment Utilized During Treatment: Gait belt Activity Tolerance: Patient tolerated treatment well Patient left: with chair alarm set;in chair;with call bell/phone within reach Nurse Communication: Mobility status;Other (comment) (notified pt in chair with alarm on and is impulsive) PT Visit Diagnosis: Unsteadiness on feet (R26.81)    Time: 1427-6701 PT Time Calculation  (min) (ACUTE ONLY): 30 min   Charges:   PT Evaluation $PT Eval Moderate Complexity: 1 Melina Schools, PT Acute Rehab Services Pager [phone] [facility] [phone]    Karlton Lemon 09/19/2019, 12:16 PM

## 2019-09-19 NOTE — Progress Notes (Signed)
STROKE TEAM PROGRESS NOTE   INTERVAL HISTORY Pt sitting in chair for lunch. He still not orientated but no focal neuro deficit. He admitted that his glucose not in good control, he only sometimes using insulin which he suppose to use daily. He was educated on stroke risk factor modification and I have put a consult for diabetic education.  Vitals:   09/19/19 0750 09/19/19 0850 09/19/19 0950 09/19/19 1134  BP: (!) 101/63 93/70 110/73 (!) 149/94  Pulse: (!) 106 101 100 97  Resp: 20 20 20 20   Temp: 99.5 F (37.5 C) 98.1 F (36.7 C) 98.4 F (36.9 C) 98.5 F (36.9 C)  TempSrc: Oral Oral Oral Oral  SpO2: 93% 94% 94% 95%  Weight:       CBC:  Recent Labs  Lab 09/18/19 0620 09/18/19 0620 09/18/19 0826 09/19/19 0928  WBC 8.4  --   --  12.3*  NEUTROABS  --   --   --  10.3*  HGB 17.2*   < > 17.0 15.6  HCT 55.2*   < > 50.0 48.9  MCV 86.5  --   --  84.7  PLT 283  --   --  227   < > = values in this interval not displayed.   Basic Metabolic Panel:  Recent Labs  Lab 09/18/19 0620 09/18/19 0620 09/18/19 0826 09/19/19 0928  NA 133*   < > 132* 136  K 4.9   < > 5.1 4.6  CL 99  --   --  103  CO2 25  --   --  23  GLUCOSE 350*  --   --  167*  BUN 25*  --   --  37*  CREATININE 2.29*  --   --  2.93*  CALCIUM 8.2*  --   --  7.9*   < > = values in this interval not displayed.   Lipid Panel: No results for input(s): CHOL, TRIG, HDL, CHOLHDL, VLDL, LDLCALC in the last 168 hours. HgbA1c:  Recent Labs  Lab 09/19/19 0928  HGBA1C 13.3*   Urine Drug Screen:  Recent Labs  Lab 09/18/19 0915  LABOPIA NONE DETECTED  COCAINSCRNUR NONE DETECTED  LABBENZ NONE DETECTED  AMPHETMU NONE DETECTED  THCU NONE DETECTED  LABBARB NONE DETECTED    Alcohol Level  Recent Labs  Lab 09/18/19 0800  ETH <10    IMAGING past 24 hours DG Chest 1 View  Result Date: 09/19/2019 CLINICAL DATA:  Shortness of breath.  History of CHF. EXAM: CHEST  1 VIEW COMPARISON:  09/18/2019. FINDINGS: Cardiomegaly.  Mild bilateral interstitial prominence. Findings consistent with CHF with bilateral interstitial edema. No pleural effusions or pneumothorax. IMPRESSION: Cardiomegaly with mild bilateral interstitial prominence consistent with interstitial edema. Pneumonitis cannot be excluded. Electronically Signed   By: Marcello Moores  Register   On: 09/19/2019 06:42   MR ANGIO HEAD WO CONTRAST  Result Date: 09/18/2019 CLINICAL DATA:  Acute stroke on MRI brain EXAM: MRA HEAD WITHOUT CONTRAST TECHNIQUE: Angiographic images of the Circle of Willis were obtained using MRA technique without intravenous contrast. COMPARISON:  None. FINDINGS: Intracranial internal carotid arteries are patent. There is moderate to marked stenosis of the clinoid and proximal supraclinoid portions of the right ICA. Middle and anterior cerebral arteries are patent. Mild to moderate stenosis of the right M1 MCA. Intracranial vertebral arteries are not definitely identified. There appear to be patent posteroinferior cerebellar arteries. 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 Posterior cerebral arteries are patent. Bilateral posterior communicating arteries are present. There is no aneurysm. IMPRESSION: Intracranial vertebral arteries not definitely identified with high-grade stenosis or occlusion. 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 Posterior cerebral arteries are patent. There are large bilateral posterior communicating arteries. Moderate to marked stenosis of the clinoid and proximal supraclinoid right ICA. Mild to moderate stenosis of the right M1 MCA. Electronically Signed   By: Macy Mis M.D.   On: 09/18/2019 16:38   MR BRAIN WO CONTRAST  Result Date: 09/18/2019 CLINICAL DATA:  Provided history: Mental status change, unknown cause. EXAM: MRI HEAD WITHOUT CONTRAST  TECHNIQUE: Multiplanar, multiecho pulse sequences of the brain and surrounding structures were obtained without intravenous contrast. COMPARISON:  Head CT 08/18/2019, brain MRI 10/25/2017 FINDINGS: Brain: Mild generalized parenchymal atrophy. The examination is intermittently motion degraded, limiting evaluation. Most notably, there is moderate/severe motion degradation of the axial SWI sequence. 6 mm focus of restricted diffusion consistent with acute infarction within the right temporoparietal white matter, immediately posterolateral to the right thalamus (series 2, image 27). Mild T2 shine through at site of chronic lacunar infarcts within the frontal lobe white matter bilaterally (series 2, image 37). Small chronic lacunar infarcts within the cerebral white matter and right basal ganglia are new as compared to the prior MRI of 10/25/2017. Redemonstrated small chronic lacunar infarcts within the inferior left cerebellum. Background mild patchy T2/FLAIR hyperintensity within the cerebral white matter is nonspecific, but consistent with chronic small vessel ischemic disease. These findings have progressed as compared to the prior MRI. No evidence of intracranial mass. No definite chronic intracranial blood products are identified on the significantly motion degraded SWI sequence. No extra-axial fluid collection. No midline shift. Vascular: Expected proximal arterial flow voids. Skull and upper cervical spine: No focal marrow lesion. Sinuses/Orbits: Visualized orbits show no acute finding. Paranasal sinus disease. Most notably, there is moderate mucosal thickening within the ethmoid, sphenoid and left maxillary sinuses. Trace fluid within right mastoid air cells. IMPRESSION: Motion degraded examination as described. 6 mm acute infarct within the right temporoparietal white matter, immediately posterolateral to the right thalamus. Small chronic lacunar infarcts within the bilateral frontal lobe white matter, right  basal ganglia and left cerebellum. The frontal lobe white matter and right basal ganglia lacunar infarcts were not present on the prior MRI of 10/25/2017. Background mild generalized parenchymal atrophy and progressive mild chronic small vessel ischemic changes within the cerebral white matter. Pansinusitis, most notably ethmoid, sphenoid and left maxillary. Trace right mastoid effusion. Electronically Signed   By: Kellie Simmering DO   On: 09/18/2019 12:36   EEG adult  Result Date: 09/18/2019 Lora Havens, MD     09/18/2019  9:58 PM Daniel [name] MRN: [id number] Epilepsy Attending: Lora Havens Referring Physician/Provider: Dr Roland Rack Date: 09/18/2019 Duration: 20.46 mins Daniel history: This is a 54 year old male presenting to the hospital for initial shortness of breath.  On the hospital wife noted that he has been confused. EEG to evaluate for seizure. Level of alertness: Awake/ lethargic AEDs during EEG study: None Technical aspects: This EEG study was done with scalp electrodes positioned according to the 10-20 International system of electrode placement. Electrical activity was acquired at a sampling rate of 500Hz  and reviewed with a high frequency filter of 70Hz  and a low frequency filter of 1Hz . EEG data were recorded continuously and digitally stored. Description: The posterior  dominant rhythm consists of 8 Hz activity of moderate voltage (25-35 uV) seen predominantly in posterior head regions, symmetric and reactive to eye opening and eye closing. EEG showed intermittent generalized rhythmic 3 to 6 Hz theta-delta slowing. Hyperventilation and photic stimulation were not performed.   ABNORMALITY -Intermittent rhythmic slow, generalized IMPRESSION: This study is suggestive of mild diffuse encephalopathy, nonspecific etiology. No seizures or epileptiform discharges were seen throughout the recording. Lora Havens   ECHOCARDIOGRAM COMPLETE  Result Date: 09/19/2019     ECHOCARDIOGRAM REPORT   Daniel Name:   Daniel [name] Date of Exam: 09/19/2019 Medical Rec #:  [id number]     Height:       70.0 in Accession #:    [id number]    Weight:       296.1 lb Date of Birth:  Sep 28, 1965     BSA:          2.466 m Daniel Age:    54 years      BP:           175/102 mmHg Daniel Gender: M             HR:           110 bpm. Exam Location:  Inpatient Procedure: 2D Echo and Intracardiac Opacification Agent Indications:    Stroke I163.9  History:        Daniel has no prior history of Echocardiogram examinations.                 CHF; Risk Factors:Hypertension and Diabetes.  Sonographer:    Mikki Santee RDCS (AE) Referring Phys: [id number] [facility] A SMITH  Sonographer Comments: Technically difficult study due to poor echo windows, suboptimal parasternal window and suboptimal apical window. Image acquisition challenging due to Daniel body habitus. IMPRESSIONS  1. Technically difficult study, very limited views even after contrast administration. Left ventricle is poorly visualized, unable to rule out systolic dysfunction. Consider cardiac MRI to better evaluate LV function  2. Right ventricular systolic function is normal. The right ventricular size is normal.  3. The mitral valve is grossly normal. No evidence of mitral valve regurgitation.  4. The aortic valve was not well visualized. Aortic valve regurgitation is not visualized. FINDINGS  Left Ventricle: Left ventricular ejection fraction, by estimation, is 40 to 45%. The left ventricle has mildly decreased function. Left ventricular endocardial border not optimally defined to evaluate regional wall motion. Definity contrast agent was given IV to delineate the left ventricular endocardial borders. The left ventricular internal cavity size was normal in size. There is mild left ventricular hypertrophy. Left ventricular diastolic parameters are indeterminate. Right Ventricle: The right ventricular size is normal. Right vetricular wall thickness  was not assessed. Right ventricular systolic function is normal. Left Atrium: Left atrial size was not well visualized. Right Atrium: Right atrial size was not well visualized. Pericardium: Trivial pericardial effusion is present. Presence of pericardial fat pad. Mitral Valve: The mitral valve is grossly normal. No evidence of mitral valve regurgitation. Tricuspid Valve: The tricuspid valve is not well visualized. Tricuspid valve regurgitation is not demonstrated. Aortic Valve: The aortic valve was not well visualized. Aortic valve regurgitation is not visualized. Pulmonic Valve: The pulmonic valve was not well visualized. Pulmonic valve regurgitation is not visualized. Aorta: The aortic root is normal in size and structure. IAS/Shunts: The interatrial septum was not well visualized.  LEFT VENTRICLE PLAX 2D LVIDd:         5.10 cm  Diastology LVIDs:  3.40 cm  LV e' medial: 6.22 cm/s LV PW:         1.30 cm LV IVS:        1.30 cm LVOT diam:     2.50 cm LVOT Area:     4.91 cm  RIGHT VENTRICLE TAPSE (M-mode): 2.4 cm LEFT ATRIUM             Index       RIGHT ATRIUM           Index LA diam:        3.60 cm 1.46 cm/m  RA Area:     13.30 cm LA Vol (A2C):   46.6 ml 18.90 ml/m RA Volume:   31.00 ml  12.57 ml/m LA Vol (A4C):   37.3 ml 15.13 ml/m LA Biplane Vol: 43.9 ml 17.80 ml/m   AORTA Ao Root diam: 3.60 cm  SHUNTS Systemic Diam: 2.50 cm Oswaldo Milian MD Electronically signed by Oswaldo Milian MD Signature Date/Time: 09/19/2019/11:27:19 AM    Final     PHYSICAL EXAM  Temp:  [97.5 F (36.4 C)-99.6 F (37.6 C)] 98.5 F (36.9 C) (07/29 1134) Pulse Rate:  [71-130] 97 (07/29 1134) Resp:  [16-33] 20 (07/29 1134) BP: (93-175)/(63-102) 149/94 (07/29 1134) SpO2:  [93 %-98 %] 94 % (07/29 1140) Weight:  [134 kg-134.3 kg] 134.3 kg (07/29 0321)  General - morbid obesity, well developed, in no apparent distress.  Ophthalmologic - fundi not visualized due to noncooperation.  Cardiovascular -  Regular rhythm and rate.  Mental Status -  Level of arousal and orientation to self, DOB, place and person were intact, but not to age or time. Language including expression, naming, repetition, comprehension was assessed and found intact. Fund of Knowledge was assessed and was impaired.  Cranial Nerves II - XII - II - Visual field intact OU. III, IV, VI - Extraocular movements intact. V - Facial sensation intact bilaterally. VII - Facial movement intact bilaterally. VIII - Hearing & vestibular intact bilaterally. X - Palate elevates symmetrically. XI - Chin turning & shoulder shrug intact bilaterally. XII - Tongue protrusion intact.  Motor Strength - The Daniel's strength was normal in all extremities and pronator drift was absent.  Bulk was normal and fasciculations were absent.   Motor Tone - Muscle tone was assessed at the neck and appendages and was normal.  Reflexes - The Daniel's reflexes were symmetrical in all extremities and he had no pathological reflexes.  Sensory - Light touch, temperature/pinprick were assessed and were symmetrical.    Coordination - The Daniel had normal movements in the hands with no ataxia or dysmetria.  Tremor was absent.  Gait and Station - deferred.   ASSESSMENT/PLAN Daniel [name] is a 54 y.o. male with history of stroke, seizure, pulmonary embolism, obstructive sleep apnea, migraines, hypertension, hypercholesterolemia, diabetes and chronic kidney disease presenting to ED w/ SOB and confusion x 2 days in setting of progressive memory loss over the past 2 years. MRI showed punctate R temporoparietal white matter infarct. Neuro consulted.     Stroke, incidental finding, punctate R temporoparietal periventricular white matter infarct secondary to small vessel disease source  MRI  punctate R temporoparietal white matter infarct. Old B frontal lobe white matter, R basal ganglia and L cerebellar lacunes.    MRA  hypoplastic posterior  circulation vs. chronic BA severe stenosis. b/l large PCOMs, and PCAs patent. R clinoid and proximal supraclinoid ICA moderate to marked stenosis.   Carotid Doppler  pending  2D Echo limited views post contrast. EF 40-45%. No obvious SOE  LDL 217 in June, repeat pending    HgbA1c 13.3  VTE prophylaxis - Lovenox 60 mg sq daily   aspirin 81 mg daily prior to admission, now on aspirin 325 mg daily and clopidogrel 75 mg daily. Continue DAPT x 3 weeks then plavix alone    Therapy recommendations:  HH PT, HH OT  Disposition:  Return home  Seizure  Seen by Dr. Jannifer Franklin as an OP, last time 03/2018, off antiepileptics at that time  Previous symptomatic sz felt r/t hyperglycemia and HTN  EEG intermittent rhythmic slow  Continue follow up with Dr. Jannifer Franklin  Hypertension  Stable . Long-term BP goal normotensive  Hyperlipidemia  Home meds:  No statin   now on lipitor 80  LDL 217 in June, repeat pending, goal < 70  Continue statin at discharge  Diabetes type II Uncontrolled w/ Peripheral Neuropathy  HgbA1c 13.3, goal < 7.0  On insulin  CBGs  SSI  Consulted diabetic educator  Close PCP follow-up for better DM control  Other Stroke Risk Factors  Hx smokeless tobacco use (chew), quit 6 yrs ago  Hx ETOH use  Morbid Obesity, Body mass index is 42.48 kg/m., recommend weight loss, diet and exercise as appropriate   Hx stroke/TIA  11/13/2017 imaging showed old strokes, no documented medical hx otherwise   Migraines  Obstructive sleep apnea, on CPAP at home  Hx Congestive heart failure  Hx PE  Other Active Problems  Acute metabolic encephalopathy in setting of progressive cognitive decline  Elevated BNP  CKD stage IIIb, creatinine 2.29->2.93  Abnormal UA, f/u UCx no growth  Hypothyroidism on synthroid  Depression   L foot drop, peroneal neuropathy, resolved  B meralgia paresthetica   Hospital day # 1   Rosalin Hawking, MD PhD Stroke  Neurology 09/19/2019 4:17 PM   To contact Stroke Continuity provider, please refer to [email or web address] After hours, contact General Neurology

## 2019-09-19 NOTE — Progress Notes (Signed)
MEWS activated. MD, CHARGE RN,  AND RAPID RESPONSE NOTIFIED

## 2019-09-19 NOTE — Evaluation (Signed)
Speech Language Pathology Evaluation Patient Details Name: Daniel Daniel [name] MRN: [id number] DOB: December 22, 1965 Today's Date: 09/19/2019 Time: 3154-0086 SLP Time Calculation (min) (ACUTE ONLY): 32 min  Problem List:  Patient Active Problem List   Diagnosis Date Noted  . CVA (cerebral vascular accident) ([place]) 09/18/2019  . Acute metabolic encephalopathy 76/19/5093  . Volume depletion   . Hyperglycemia 08/18/2019  . Left foot drop 04/12/2018  . Abnormality of gait 12/14/2017  . Labile blood glucose   . Sleep disturbance   . Peripheral edema   . Hyponatremia   . Stage 2 chronic kidney disease   . Debility 11/22/2017  . Morbid obesity ([place])   . Seizures ([place])   . OSA (obstructive sleep apnea)   . Noncompliance   . FUO (fever of unknown origin)   . Acute on chronic kidney failure ([place])   . Diabetes mellitus type 2 in nonobese ([facility])   . Diabetes mellitus type 2 in obese ([place])   . Acute blood loss anemia   . Labile blood pressure   . Fever   . Status epilepticus ([place])   . Recurrent seizures ([place]) 10/27/2017  . Altered sensorium 10/27/2017  . Dyspnea on exertion 08/14/2017  . Chronic respiratory failure with hypoxia ([place]) 08/14/2017  . Sleep apnea 08/14/2017  . Obesity hypoventilation syndrome ([place]) 08/14/2017  . Fatigue 08/14/2017  . Hypertension 08/14/2017  . Hyperlipidemia 12/23/2016  . Hypothyroid 12/23/2016  . 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 CKD (chronic kidney disease), stage III 06/24/2016  . Type 2 diabetes mellitus without complications ([place]) 26/71/2458   Past Medical History:  Past Medical History:  Diagnosis Date  . Arthritis    "elbows and wrists" (11/13/2017)  . CHF (congestive heart failure) ([place]) 08/2016  . CKD stage 3 due to type 2 diabetes mellitus ([place]) 06/2016  . Diabetes mellitus type 2 in obese ([place])   . High cholesterol   . Hypertension   . Hypothyroidism   . Left foot drop 04/12/2018  . Migraines   .  Morbid obesity ([place])   . On home oxygen therapy    "3L; now only at night" (11/13/2017)  . OSA on CPAP   . Pneumonia 2017   "double pneumonia"  . Pneumonia 10/2017   "while in ICU" (11/13/2017)  . Pulmonary embolism ([place])   . Seizure ([place])    "10/24/2017-10/28/2017; couldn't get them stopped til medically induced coma. after they brought him out he started having more; can't find RX to stop them" (11/13/2017)  . Stroke [facility])    "scans showed several mini strokes in the past; nothing recent" (11/13/2017)   Past Surgical History:  Past Surgical History:  Procedure Laterality Date  . CARDIAC CATHETERIZATION  12/2016   HPI:  Patient is a 54 year old male with past medical history of hypertension, hyperlipidemia, hypothyroidism, CVA, diabetes type 2, seizure, PE, morbid obesity, OSA on CPAP, CKD stage IIIb who presented with complaints of shortness of breath.   Assessment / Plan / Recommendation Clinical Impression  Daniel Daniel [name] demonstrates a severe cognitive impairment with deficits noted in the areas of memory, attention, orientation, problem solving, and pragmatics. Pt has very sexually inappropriate language and requires cues to redirect. He was even noted to lift Daniel gown, stating, he was here "for a penis reduction." He is disoriented to all but self. 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 He also believes he works full time, but per Daniel [name], has not  worked in [place] years. Daniel [name] does report an ongoing cognitive decline over the past couple years, but greatly increased difficulty noted over past few days, including agitation. Pt completed the [facility] Mental Status Examination with a total score of 14/30, consistent with a severe cognitive impairment/dementia. Speech/language appeared to be functional. See details below. As this is a change from baseline, ST services are indicated at acute level and likely at next level of care.  [facility] Mental  Status Examination Orientation: 0/3 Coding: 1 trial Calculations: 3/3 Naming: 2/3 Recall: 0/5 Attention: 0/2 Visual Processing: 4/6 Paragraph Memory: 4/8  Total: 14/30; WNL with 27+ being WNL    SLP Assessment  SLP Recommendation/Assessment: Patient needs continued Speech Lanaguage Pathology Services SLP Visit Diagnosis: Cognitive communication deficit (R41.841)    Follow Up Recommendations  Inpatient Rehab;Home health SLP    Frequency and Duration min 2x/week  2 weeks      SLP Evaluation Cognition  Overall Cognitive Status: Impaired/Different from baseline Arousal/Alertness: Awake/alert Orientation Level: Disoriented to time;Disoriented to situation;Disoriented to place;Disoriented to person Attention: Focused;Sustained Focused Attention: Impaired Sustained Attention: Impaired Memory: Impaired Memory Impairment: Decreased short term memory;Retrieval deficit;Decreased recall of new information Decreased Short Term Memory: Verbal basic;Functional basic Awareness: Impaired Awareness Impairment: Intellectual impairment;Emergent impairment Problem Solving: Impaired Problem Solving Impairment: Verbal basic;Functional basic Behaviors: Impulsive;Confabulation;Agitated Behavior Scale Safety/Judgment: Impaired       Comprehension  Auditory Comprehension Overall Auditory Comprehension: Appears within functional limits for tasks assessed Visual Recognition/Discrimination Discrimination: Within Function Limits    Expression Expression Primary Mode of Expression: Verbal Verbal Expression Overall Verbal Expression: Appears within functional limits for tasks assessed Initiation: No impairment Naming: No impairment Pragmatics: Impairment Impairments: Topic appropriateness;Turn Taking Interfering Components: Attention Written Expression Dominant Hand: Right Written Expression: Within Functional Limits   Oral / Motor  Oral Motor/Sensory Function Overall Oral Motor/Sensory  Function: Within functional limits Motor Speech Overall Motor Speech: Appears within functional limits for tasks assessed Articulation: Within functional limitis Intelligibility: Intelligible                     Juneau Doughman P. Lillan Mccreadie, M.S., CCC-SLP Speech-Language Pathologist Acute Rehabilitation Services Pager: [place] 09/19/2019, 10:20 AM

## 2019-09-19 NOTE — Consult Note (Signed)
Reason for Consult: Systolic congestive heart failure with mildly elevated BNP Referring Physician: [facility] hospitalist  Daniel [name] is an 54 y.o. male.  HPI: Patient is 54 year old male with past medical history significant for multiple medical problems i.e. hypertension, diabetes mellitus, hyperlipidemia, hypothyroidism, history of congestive heart failure secondary to depressed LV systolic function, chronic kidney disease stage III, history of lacunar infarction in the past, morbid obesity, obstructive sleep apnea on CPAP, history of pulmonary embolism, was admitted yesterday because of progressive increasing shortness of breath and confusion patient denies any chest pain.  Denies any history of PND orthopnea or leg swelling patient had 2D echo done which showed EF approximately 40 to 45% study is technically difficult due to poor window and marked obesity.  EKG showed poor R wave progression in anterolateral leads which was present in the past no new acute ischemic changes were noted patient was noted to have mildly elevated BNP.  Patient denies any history of MI in the past.  Denies any palpitation lightheadedness or syncope.  Patient denies any weakness in the arms or legs denies any slurred speech.  Patient had MRI of the brain which showed incidental finding of punctate infarction.  Past Medical History:  Diagnosis Date  . Arthritis    "elbows and wrists" (11/13/2017)  . CHF (congestive heart failure) ([place]) 08/2016  . CKD stage 3 due to type 2 diabetes mellitus ([place]) 06/2016  . Diabetes mellitus type 2 in obese ([place])   . High cholesterol   . Hypertension   . Hypothyroidism   . Left foot drop 04/12/2018  . Migraines   . Morbid obesity ([place])   . On home oxygen therapy    "3L; now only at night" (11/13/2017)  . OSA on CPAP   . Pneumonia 2017   "double pneumonia"  . Pneumonia 10/2017   "while in ICU" (11/13/2017)  . Pulmonary embolism ([place])   . Seizure ([place])    "10/24/2017-10/28/2017;  couldn't get them stopped til medically induced coma. after they brought him out he started having more; can't find RX to stop them" (11/13/2017)  . Stroke [facility])    "scans showed several mini strokes in the past; nothing recent" (11/13/2017)    Past Surgical History:  Procedure Laterality Date  . CARDIAC CATHETERIZATION  12/2016    Family History  Problem Relation Age of Onset  . Glaucoma Mother   . Diabetes Maternal Grandmother   . Diabetes Maternal Grandfather   . Alpha-1 antitrypsin deficiency Neg Hx   . Asthma Neg Hx   . Breast cancer Neg Hx   . Stroke Neg Hx   . Colon cancer Neg Hx   . Coronary artery disease Neg Hx   . COPD Neg Hx   . Cystic fibrosis Neg Hx   . Deep vein thrombosis Neg Hx   . Hypertension Neg Hx   . Hyperlipidemia Neg Hx   . Kidney disease Neg Hx   . Liver disease Neg Hx   . Lung cancer Neg Hx   . Lupus Neg Hx   . Neurofibromatosis Neg Hx   . Osteoporosis Neg Hx   . Ovarian cancer Neg Hx   . Positive PPD/TB Exposure Neg Hx   . Prostate cancer Neg Hx   . Pulmonary embolism Neg Hx   . Pulmonary fibrosis Neg Hx   . Rheum arthritis Neg Hx   . Sarcoidosis Neg Hx   . Sleep apnea Neg Hx   . Thyroid disease Neg Hx   .  Tuberculosis Neg Hx   . Tuberous sclerosis Neg Hx     Social History:  reports that he has never smoked. He quit smokeless tobacco use about 6 years ago.  His smokeless tobacco use included chew. He reports previous alcohol use. He reports that he does not use drugs.  Allergies:  Allergies  Allergen Reactions  . Montelukast Other (See Comments)    Possibly cause headaches per spouse    Medications: I have reviewed the patient's current medications.  Results for orders placed or performed during the hospital encounter of 09/18/19 (from the past 48 hour(s))  CBG monitoring, ED     Status: Abnormal   Collection Time: 09/18/19  6:06 AM  Result Value Ref Range   Glucose-Capillary 375 (H) 70 - 99 mg/dL    Comment: Glucose reference  range applies only to samples taken after fasting for at least 8 hours.  Urinalysis, Routine w reflex microscopic     Status: Abnormal   Collection Time: 09/18/19  6:14 AM  Result Value Ref Range   Color, Urine YELLOW YELLOW   APPearance HAZY (A) CLEAR   Specific Gravity, Urine 1.021 1.005 - 1.030   pH 5.0 5.0 - 8.0   Glucose, UA >=500 (A) NEGATIVE mg/dL   Hgb urine dipstick SMALL (A) NEGATIVE   Bilirubin Urine NEGATIVE NEGATIVE   Ketones, ur NEGATIVE NEGATIVE mg/dL   Protein, ur >=300 (A) NEGATIVE mg/dL   Nitrite NEGATIVE NEGATIVE   Leukocytes,Ua NEGATIVE NEGATIVE   RBC / HPF 11-20 0 - 5 RBC/hpf   WBC, UA 6-10 0 - 5 WBC/hpf   Bacteria, UA RARE (A) NONE SEEN   Squamous Epithelial / LPF 0-5 0 - 5   Mucus PRESENT    Hyaline Casts, UA PRESENT    Granular Casts, UA PRESENT     Comment: Performed at [facility], [address] [place], [place] [address]  Culture, Urine     Status: None   Collection Time: 09/18/19  6:14 AM   Specimen: Urine, Random  Result Value Ref Range   Specimen Description URINE, RANDOM    Special Requests NONE    Culture      NO GROWTH Performed at [facility], [place] [address] [place], [place] [address]    Report Status 09/19/2019 FINAL   Basic metabolic panel     Status: Abnormal   Collection Time: 09/18/19  6:20 AM  Result Value Ref Range   Sodium 133 (L) 135 - 145 mmol/L   Potassium 4.9 3.5 - 5.1 mmol/L   Chloride 99 98 - 111 mmol/L   CO2 25 22 - 32 mmol/L   Glucose, Bld 350 (H) 70 - 99 mg/dL    Comment: Glucose reference range applies only to samples taken after fasting for at least 8 hours.   BUN 25 (H) 6 - 20 mg/dL   Creatinine, Ser 2.29 (H) 0.61 - 1.24 mg/dL   Calcium 8.2 (L) 8.9 - 10.3 mg/dL   GFR calc non Af Amer 31 (L) >60 mL/min   GFR calc Af Amer 36 (L) >60 mL/min   Anion gap 9 5 - 15    Comment: Performed at [facility] [address] [place] [address]  CBC     Status: Abnormal   Collection Time:  09/18/19  6:20 AM  Result Value Ref Range   WBC 8.4 4.0 - 10.5 K/uL   RBC 6.38 (H) 4.22 - 5.81 MIL/uL   Hemoglobin 17.2 (  H) 13.0 - 17.0 g/dL   HCT 55.2 (H) 39 - 52 %   MCV 86.5 80.0 - 100.0 fL   MCH 27.0 26.0 - 34.0 pg   MCHC 31.2 30.0 - 36.0 g/dL   RDW 14.5 11.5 - 15.5 %   Platelets 283 150 - 400 K/uL   nRBC 0.0 0.0 - 0.2 %    Comment: Performed at [facility] [address], [place], [place] [address]  Hepatic function panel     Status: Abnormal   Collection Time: 09/18/19  7:39 AM  Result Value Ref Range   Total Protein 6.1 (L) 6.5 - 8.1 g/dL   Albumin 2.4 (L) 3.5 - 5.0 g/dL   AST 20 15 - 41 U/L   ALT 13 0 - 44 U/L   Alkaline Phosphatase 60 38 - 126 U/L   Total Bilirubin 0.9 0.3 - 1.2 mg/dL   Bilirubin, Direct 0.2 0.0 - 0.2 mg/dL   Indirect Bilirubin 0.7 0.3 - 0.9 mg/dL    Comment: Performed at [facility] [address], [place], [place] [address]  Ethanol     Status: None   Collection Time: 09/18/19  8:00 AM  Result Value Ref Range   Alcohol, Ethyl (B) <10 <10 mg/dL    Comment: (NOTE) Lowest detectable limit for serum alcohol is 10 mg/dL.  For medical purposes only. Performed at [facility], [place] [address] [place], [place] [address]   Brain natriuretic peptide     Status: Abnormal   Collection Time: 09/18/19  8:04 AM  Result Value Ref Range   B Natriuretic Peptide 553.8 (H) 0.0 - 100.0 pg/mL    Comment: Performed at [facility] [address], [place], [place] [address]  I-Stat arterial blood gas, ED     Status: Abnormal   Collection Time: 09/18/19  8:26 AM  Result Value Ref Range   pH, Arterial 7.425 7.35 - 7.45   pCO2 arterial 35.2 32 - 48 mmHg   pO2, Arterial 71 (L) 83 - 108 mmHg   Bicarbonate 23.0 20.0 - 28.0 mmol/L   TCO2 24 22 - 32 mmol/L   O2 Saturation 94.0 %   Acid-base deficit 1.0 0.0 - 2.0 mmol/L   Sodium 132 (L) 135 - 145 mmol/L   Potassium 5.1 3.5 - 5.1 mmol/L   Calcium, Ion 1.09 (L) 1.15 - 1.40 mmol/L   HCT 50.0  39 - 52 %   Hemoglobin 17.0 13.0 - 17.0 g/dL   Patient temperature 99.3 F    Collection site Radial    Drawn by RT    Sample type ARTERIAL   SARS Coronavirus 2 by RT PCR (hospital order, performed in [place] hospital lab) Nasopharyngeal Nasopharyngeal Swab     Status: None   Collection Time: 09/18/19  8:50 AM   Specimen: Nasopharyngeal Swab  Result Value Ref Range   SARS Coronavirus 2 NEGATIVE NEGATIVE    Comment: (NOTE) SARS-CoV-2 target nucleic acids are NOT DETECTED.  The SARS-CoV-2 RNA is generally detectable in upper and lower respiratory specimens during the acute phase of infection. The lowest concentration of SARS-CoV-2 viral copies this assay can detect is 250 copies / mL. A negative result does not preclude SARS-CoV-2 infection and should not be used as the sole basis for treatment or other patient management decisions.  A negative result may occur with improper specimen collection / handling, submission of specimen other than nasopharyngeal swab, presence of viral mutation(s) within the  areas targeted by this assay, and inadequate number of viral copies (<250 copies / mL). A negative result must be combined with clinical observations, patient history, and epidemiological information.  Fact Sheet for Patients:   StrictlyIdeas.no  Fact Sheet for Healthcare Providers: BankingDealers.co.za  This test is not yet approved or  cleared by the [place] FDA and has been authorized for detection and/or diagnosis of SARS-CoV-2 by FDA under an Emergency Use Authorization (EUA).  This EUA will remain in effect (meaning this test can be used) for the duration of the COVID-19 declaration under Section 564(b)(1) of the Act, 21 U.S.C. section 360bbb-3(b)(1), unless the authorization is terminated or revoked sooner.  Performed at [facility], [place] [address] [place], [place] [address]   Urine rapid drug screen (hosp  performed)     Status: None   Collection Time: 09/18/19  9:15 AM  Result Value Ref Range   Opiates NONE DETECTED NONE DETECTED   Cocaine NONE DETECTED NONE DETECTED   Benzodiazepines NONE DETECTED NONE DETECTED   Amphetamines NONE DETECTED NONE DETECTED   Tetrahydrocannabinol NONE DETECTED NONE DETECTED   Barbiturates NONE DETECTED NONE DETECTED    Comment: (NOTE) DRUG SCREEN FOR MEDICAL PURPOSES ONLY.  IF CONFIRMATION IS NEEDED FOR ANY PURPOSE, NOTIFY LAB WITHIN 5 DAYS.  LOWEST DETECTABLE LIMITS FOR URINE DRUG SCREEN Drug Class                     Cutoff (ng/mL) Amphetamine and metabolites    1000 Barbiturate and metabolites    200 Benzodiazepine                 665 Tricyclics and metabolites     300 Opiates and metabolites        300 Cocaine and metabolites        300 THC                            50 Performed at [facility], [place] [address] [place], [place] [address]   CBG monitoring, ED     Status: Abnormal   Collection Time: 09/18/19 12:14 PM  Result Value Ref Range   Glucose-Capillary 348 (H) 70 - 99 mg/dL    Comment: Glucose reference range applies only to samples taken after fasting for at least 8 hours.  CBG monitoring, ED     Status: Abnormal   Collection Time: 09/18/19  4:23 PM  Result Value Ref Range   Glucose-Capillary 242 (H) 70 - 99 mg/dL    Comment: Glucose reference range applies only to samples taken after fasting for at least 8 hours.  Glucose, capillary     Status: Abnormal   Collection Time: 09/18/19  9:41 PM  Result Value Ref Range   Glucose-Capillary 212 (H) 70 - 99 mg/dL    Comment: Glucose reference range applies only to samples taken after fasting for at least 8 hours.  Glucose, capillary     Status: Abnormal   Collection Time: 09/19/19  5:57 AM  Result Value Ref Range   Glucose-Capillary 136 (H) 70 - 99 mg/dL    Comment: Glucose reference range applies only to samples taken after fasting for at least 8 hours.  Glucose, capillary      Status: Abnormal   Collection Time: 09/19/19  9:07 AM  Result Value Ref Range   Glucose-Capillary 173 (H) 70 - 99 mg/dL    Comment: Glucose reference range  applies only to samples taken after fasting for at least 8 hours.  Hemoglobin A1c     Status: Abnormal   Collection Time: 09/19/19  9:28 AM  Result Value Ref Range   Hgb A1c MFr Bld 13.3 (H) 4.8 - 5.6 %    Comment: (NOTE) Pre diabetes:          5.7%-6.4%  Diabetes:              >6.4%  Glycemic control for   <7.0% adults with diabetes    Mean Plasma Glucose 335.01 mg/dL    Comment: Performed at [facility] [address], [place], [place] [address]  CBC with Differential/Platelet     Status: Abnormal   Collection Time: 09/19/19  9:28 AM  Result Value Ref Range   WBC 12.3 (H) 4.0 - 10.5 K/uL   RBC 5.77 4.22 - 5.81 MIL/uL   Hemoglobin 15.6 13.0 - 17.0 g/dL   HCT 48.9 39 - 52 %   MCV 84.7 80.0 - 100.0 fL   MCH 27.0 26.0 - 34.0 pg   MCHC 31.9 30.0 - 36.0 g/dL   RDW 14.5 11.5 - 15.5 %   Platelets 227 150 - 400 K/uL   nRBC 0.0 0.0 - 0.2 %   Neutrophils Relative % 84 %   Neutro Abs 10.3 (H) 1.7 - 7.7 K/uL   Lymphocytes Relative 6 %   Lymphs Abs 0.8 0.7 - 4.0 K/uL   Monocytes Relative 9 %   Monocytes Absolute 1.1 (H) 0 - 1 K/uL   Eosinophils Relative 0 %   Eosinophils Absolute 0.0 0 - 0 K/uL   Basophils Relative 0 %   Basophils Absolute 0.0 0 - 0 K/uL   Immature Granulocytes 1 %   Abs Immature Granulocytes 0.06 0.00 - 0.07 K/uL    Comment: Performed at [facility] [address], [place], [place] [address]  Basic metabolic panel     Status: Abnormal   Collection Time: 09/19/19  9:28 AM  Result Value Ref Range   Sodium 136 135 - 145 mmol/L   Potassium 4.6 3.5 - 5.1 mmol/L   Chloride 103 98 - 111 mmol/L   CO2 23 22 - 32 mmol/L   Glucose, Bld 167 (H) 70 - 99 mg/dL    Comment: Glucose reference range applies only to samples taken after fasting for at least 8 hours.   BUN 37 (H) 6 - 20 mg/dL   Creatinine,  Ser 2.93 (H) 0.61 - 1.24 mg/dL   Calcium 7.9 (L) 8.9 - 10.3 mg/dL   GFR calc non Af Amer 23 (L) >60 mL/min   GFR calc Af Amer 27 (L) >60 mL/min   Anion gap 10 5 - 15    Comment: Performed at [facility] [address] [place], [place] [address]  Glucose, capillary     Status: Abnormal   Collection Time: 09/19/19 11:37 AM  Result Value Ref Range   Glucose-Capillary 175 (H) 70 - 99 mg/dL    Comment: Glucose reference range applies only to samples taken after fasting for at least 8 hours.  Glucose, capillary     Status: Abnormal   Collection Time: 09/19/19  4:00 PM  Result Value Ref Range   Glucose-Capillary 139 (H) 70 - 99 mg/dL    Comment: Glucose reference range applies only to samples taken after fasting for at least 8 hours.    DG Skull 1-3 Views  Result Date: 09/18/2019 CLINICAL DATA:  Screening for MRI. EXAM: SKULL - 1-3 VIEW COMPARISON:  None. FINDINGS: No metallic foreign body. No fracture or bone lesion. Clear sinuses. Soft tissues are unremarkable. IMPRESSION: Negative.  No metal foreign body. Electronically Signed   By: Lajean Manes M.D.   On: 09/18/2019 11:33   DG Neck Soft Tissue  Result Date: 09/18/2019 CLINICAL DATA:  Screening for MRI. EXAM: NECK SOFT TISSUES - 1+ VIEW COMPARISON:  None. FINDINGS: No metallic foreign body. Normal soft tissues.  Airway is widely patent. Mild disc degenerative changes at C5-C6 and C6-C7 with mild loss of disc height and endplate spurring. No skeletal abnormality. IMPRESSION: 1. No metallic foreign body.  No acute finding. Electronically Signed   By: Lajean Manes M.D.   On: 09/18/2019 11:35   DG Chest 1 View  Result Date: 09/19/2019 CLINICAL DATA:  Shortness of breath.  History of CHF. EXAM: CHEST  1 VIEW COMPARISON:  09/18/2019. FINDINGS: Cardiomegaly. Mild bilateral interstitial prominence. Findings consistent with CHF with bilateral interstitial edema. No pleural effusions or pneumothorax. IMPRESSION: Cardiomegaly with mild  bilateral interstitial prominence consistent with interstitial edema. Pneumonitis cannot be excluded. Electronically Signed   By: Marcello Moores  Register   On: 09/19/2019 06:42   DG Chest 2 View  Result Date: 09/18/2019 CLINICAL DATA:  Syncope EXAM: CHEST - 2 VIEW COMPARISON:  August 18, 2019 FINDINGS: Lungs are clear. Heart is upper normal in size with pulmonary vascularity normal. No evident adenopathy. There is degenerative change in the lower thoracic and upper lumbar regions. IMPRESSION: Lungs clear. Heart upper normal in size. No adenopathy appreciable. Electronically Signed   By: Lowella Grip III M.D.   On: 09/18/2019 08:21   DG Pelvis 1-2 Views  Result Date: 09/18/2019 CLINICAL DATA:  Screening for MRI. EXAM: PELVIS - 1-2 VIEW COMPARISON:  12/09/2017 FINDINGS: No fracture or bone lesion. The SI joints, pubic symphysis and hip joints are normally spaced and aligned. Degenerative disc changes are noted of the lower lumbar spine. Soft tissues are unremarkable. No metallic foreign bodies. IMPRESSION: 1. No metallic foreign bodies. 2. No fracture, bone lesion or joint abnormality of the pelvis or hips. Electronically Signed   By: Lajean Manes M.D.   On: 09/18/2019 11:33   DG Abd 1 View  Result Date: 09/18/2019 CLINICAL DATA:  Screening for MRI. EXAM: ABDOMEN - 1 VIEW COMPARISON:  10/28/2017 FINDINGS: No metallic foreign body. Normal bowel gas pattern. No evidence of a soft tissue mass or organomegaly. No evidence of renal or ureteral stones. IMPRESSION: 1. No metallic foreign body. 2. No acute findings. Electronically Signed   By: Lajean Manes M.D.   On: 09/18/2019 11:35   MR ANGIO HEAD WO CONTRAST  Result Date: 09/18/2019 CLINICAL DATA:  Acute stroke on MRI brain EXAM: MRA HEAD WITHOUT CONTRAST TECHNIQUE: Angiographic images of the Circle of Willis were obtained using MRA technique without intravenous contrast. COMPARISON:  None. FINDINGS: Intracranial internal carotid arteries are patent. There is  moderate to marked stenosis of the clinoid and proximal supraclinoid portions of the right ICA. Middle and anterior cerebral arteries are patent. Mild to moderate stenosis of the right M1 MCA. Intracranial vertebral arteries are not definitely identified. There appear to be patent posteroinferior cerebellar arteries. 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 Posterior cerebral arteries are patent. Bilateral posterior communicating arteries are present. There is no aneurysm. IMPRESSION: Intracranial vertebral arteries not definitely identified with high-grade stenosis or occlusion. 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 Posterior cerebral arteries are patent. There are large bilateral posterior communicating arteries. Moderate to marked stenosis of the clinoid and proximal supraclinoid right ICA. Mild to moderate stenosis of the right M1 MCA. Electronically Signed   By: Macy Mis M.D.   On: 09/18/2019 16:38   MR BRAIN WO CONTRAST  Result Date: 09/18/2019 CLINICAL DATA:  Provided history: Mental status change, unknown cause. EXAM: MRI HEAD WITHOUT CONTRAST TECHNIQUE: Multiplanar, multiecho pulse sequences of the brain and surrounding structures were obtained without intravenous contrast. COMPARISON:  Head CT 08/18/2019, brain MRI 10/25/2017 FINDINGS: Brain: Mild generalized parenchymal atrophy. The examination is intermittently motion degraded, limiting evaluation. Most notably, there is moderate/severe motion degradation of the axial SWI sequence. 6 mm focus of restricted diffusion consistent with acute infarction within the right temporoparietal white matter, immediately posterolateral to the right thalamus (series 2, image 27). Mild T2 shine through at site of chronic lacunar infarcts within the frontal lobe white matter  bilaterally (series 2, image 37). Small chronic lacunar infarcts within the cerebral white matter and right basal ganglia are new as compared to the prior MRI of 10/25/2017. Redemonstrated small chronic lacunar infarcts within the inferior left cerebellum. Background mild patchy T2/FLAIR hyperintensity within the cerebral white matter is nonspecific, but consistent with chronic small vessel ischemic disease. These findings have progressed as compared to the prior MRI. No evidence of intracranial mass. No definite chronic intracranial blood products are identified on the significantly motion degraded SWI sequence. No extra-axial fluid collection. No midline shift. Vascular: Expected proximal arterial flow voids. Skull and upper cervical spine: No focal marrow lesion. Sinuses/Orbits: Visualized orbits show no acute finding. Paranasal sinus disease. Most notably, there is moderate mucosal thickening within the ethmoid, sphenoid and left maxillary sinuses. Trace fluid within right mastoid air cells. IMPRESSION: Motion degraded examination as described. 6 mm acute infarct within the right temporoparietal white matter, immediately posterolateral to the right thalamus. Small chronic lacunar infarcts within the bilateral frontal lobe white matter, right basal ganglia and left cerebellum. The frontal lobe white matter and right basal ganglia lacunar infarcts were not present on the prior MRI of 10/25/2017. Background mild generalized parenchymal atrophy and progressive mild chronic small vessel ischemic changes within the cerebral white matter. Pansinusitis, most notably ethmoid, sphenoid and left maxillary. Trace right mastoid effusion. Electronically Signed   By: Kellie Simmering DO   On: 09/18/2019 12:36   EEG adult  Result Date: 09/18/2019 Lora Havens, MD     09/18/2019  9:58 PM Patient Name: Daniel [name] MRN: [id number] Epilepsy Attending: Lora Havens Referring Physician/Provider: Dr Roland Rack Date:  09/18/2019 Duration: 20.46 mins Patient history: This is a 53 year old male presenting to the hospital for initial shortness of breath.  On the hospital wife noted that he has been confused. EEG to evaluate for seizure. Level of alertness: Awake/ lethargic AEDs during EEG study: None Technical aspects: This EEG study was done with scalp electrodes positioned according to the 10-20 International system of electrode placement. Electrical activity was acquired at a sampling rate of 500Hz  and reviewed with a high frequency filter of 70Hz  and a low frequency filter of 1Hz . EEG data were recorded continuously and digitally stored. Description: The posterior dominant rhythm consists of 8 Hz activity of moderate voltage (25-35 uV) seen predominantly in posterior head regions, symmetric and reactive to eye opening and eye closing. EEG showed intermittent generalized  rhythmic 3 to 6 Hz theta-delta slowing. Hyperventilation and photic stimulation were not performed.   ABNORMALITY -Intermittent rhythmic slow, generalized IMPRESSION: This study is suggestive of mild diffuse encephalopathy, nonspecific etiology. No seizures or epileptiform discharges were seen throughout the recording. Lora Havens   ECHOCARDIOGRAM COMPLETE  Result Date: 09/19/2019    ECHOCARDIOGRAM REPORT   Patient Name:   Daniel [name] Date of Exam: 09/19/2019 Medical Rec #:  [id number]     Height:       70.0 in Accession #:    [id number]    Weight:       296.1 lb Date of Birth:  1966-02-21     BSA:          2.466 m Patient Age:    51 years      BP:           175/102 mmHg Patient Gender: M             HR:           110 bpm. Exam Location:  Inpatient Procedure: 2D Echo and Intracardiac Opacification Agent Indications:    Stroke I163.9  History:        Patient has no prior history of Echocardiogram examinations.                 CHF; Risk Factors:Hypertension and Diabetes.  Sonographer:    Mikki Santee RDCS (AE) Referring Phys: [id number] [facility]   Sonographer Comments: Technically difficult study due to poor echo windows, suboptimal parasternal window and suboptimal apical window. Image acquisition challenging due to patient body habitus. IMPRESSIONS  1. Technically difficult study, very limited views even after contrast administration. Left ventricle is poorly visualized, unable to rule out systolic dysfunction. Consider cardiac MRI to better evaluate LV function  2. Right ventricular systolic function is normal. The right ventricular size is normal.  3. The mitral valve is grossly normal. No evidence of mitral valve regurgitation.  4. The aortic valve was not well visualized. Aortic valve regurgitation is not visualized. FINDINGS  Left Ventricle: Left ventricular ejection fraction, by estimation, is 40 to 45%. The left ventricle has mildly decreased function. Left ventricular endocardial border not optimally defined to evaluate regional wall motion. Definity contrast agent was given IV to delineate the left ventricular endocardial borders. The left ventricular internal cavity size was normal in size. There is mild left ventricular hypertrophy. Left ventricular diastolic parameters are indeterminate. Right Ventricle: The right ventricular size is normal. Right vetricular wall thickness was not assessed. Right ventricular systolic function is normal. Left Atrium: Left atrial size was not well visualized. Right Atrium: Right atrial size was not well visualized. Pericardium: Trivial pericardial effusion is present. Presence of pericardial fat pad. Mitral Valve: The mitral valve is grossly normal. No evidence of mitral valve regurgitation. Tricuspid Valve: The tricuspid valve is not well visualized. Tricuspid valve regurgitation is not demonstrated. Aortic Valve: The aortic valve was not well visualized. Aortic valve regurgitation is not visualized. Pulmonic Valve: The pulmonic valve was not well visualized. Pulmonic valve regurgitation is not visualized. Aorta:  The aortic root is normal in size and structure. IAS/Shunts: The interatrial septum was not well visualized.  LEFT VENTRICLE PLAX 2D LVIDd:         5.10 cm  Diastology LVIDs:         3.40 cm  LV e' medial: 6.22 cm/s LV PW:         1.30 cm LV IVS:  1.30 cm LVOT diam:     2.50 cm LVOT Area:     4.91 cm  RIGHT VENTRICLE TAPSE (M-mode): 2.4 cm LEFT ATRIUM             Index       RIGHT ATRIUM           Index LA diam:        3.60 cm 1.46 cm/m  RA Area:     13.30 cm LA Vol (A2C):   46.6 ml 18.90 ml/m RA Volume:   31.00 ml  12.57 ml/m LA Vol (A4C):   37.3 ml 15.13 ml/m LA Biplane Vol: 43.9 ml 17.80 ml/m   AORTA Ao Root diam: 3.60 cm  SHUNTS Systemic Diam: 2.50 cm Oswaldo Milian MD Electronically signed by Oswaldo Milian MD Signature Date/Time: 09/19/2019/11:27:19 AM    Final     Review of Systems  Constitutional: Negative for chills and fever.  HENT: Negative for sore throat.   Eyes: Negative for discharge.  Respiratory: Positive for shortness of breath.   Cardiovascular: Negative for chest pain, palpitations and leg swelling.  Gastrointestinal: Negative for abdominal pain.  Genitourinary: Negative for difficulty urinating.  Neurological: Negative for dizziness, facial asymmetry, speech difficulty and headaches.   Blood pressure 124/72, pulse 89, temperature 99.6 F (37.6 C), temperature source Oral, resp. rate 20, weight (!) 134.3 kg, SpO2 94 %. Physical Exam Constitutional:      Appearance: Normal appearance.  HENT:     Head: Normocephalic and atraumatic.  Eyes:     Extraocular Movements: Extraocular movements intact.     Conjunctiva/sclera: Conjunctivae normal.     Pupils: Pupils are equal, round, and reactive to light.  Neck:     Vascular: No carotid bruit.  Cardiovascular:     Rate and Rhythm: Normal rate and regular rhythm.     Heart sounds: No murmur heard.  No gallop.   Pulmonary:     Effort: Pulmonary effort is normal.     Breath sounds: Normal breath sounds.  No rales.  Abdominal:     General: Bowel sounds are normal. There is distension.     Palpations: Abdomen is soft.     Tenderness: There is no abdominal tenderness.  Musculoskeletal:     Cervical back: Normal range of motion and neck supple. No rigidity or tenderness.  Lymphadenopathy:     Cervical: No cervical adenopathy.  Skin:    General: Skin is warm and dry.  Neurological:     General: No focal deficit present.     Mental Status: He is alert and oriented to person, place, and time.     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 We will add low-dose beta-blockers consider adding BiDil as blood pressure tolerates Hold ACE inhibitor's in view of worsening renal function Charolette Forward 09/19/2019, 4:44 PM

## 2019-09-19 NOTE — Progress Notes (Signed)
°  Echocardiogram 2D Echocardiogram has been performed.  Jennette Dubin 09/19/2019, 8:42 AM

## 2019-09-19 NOTE — Progress Notes (Addendum)
Inpatient Diabetes Program Recommendations  AACE/ADA: New Consensus Statement on Inpatient Glycemic Control (2015)  Target Ranges:  Prepandial:   less than 140 mg/dL      Peak postprandial:   less than 180 mg/dL (1-2 hours)      Critically ill patients:  140 - 180 mg/dL   Lab Results  Component Value Date   GLUCAP 175 (H) 09/19/2019   HGBA1C 13.3 (H) 09/19/2019    Review of Glycemic Control Results for Daniel [name], Daniel [name] (MRN [id number]) as of 09/19/2019 13:31  Ref. Range 09/18/2019 16:23 09/18/2019 21:41 09/19/2019 05:57 09/19/2019 09:07 09/19/2019 11:37  Glucose-Capillary Latest Ref Range: 70 - 99 mg/dL 242 (H) 212 (H) 136 (H) 173 (H) 175 (H)   Diabetes history:  DM2  Outpatient Diabetes medications:  70/30 60 units BID  Novolog SSI TID  Current orders for Inpatient glycemic control:  70/30 60 units BID Novolog 0-20 units TID  Note:  Spoke with wife Wells Guiles to confirm home DM medications.  She states he takes above home meds.  Explained that there is basal and rapid acting insulin in the 70/30 and would caution using Novolog at home in addition to 70/30.  He is current with his PCP.  DM referral for uncontrolled DM.  Current A1C is 13.3% which is down from >15.5 on 08/18/2019.  Informed patient that his A1C is coming down and she is doing a great job helping him with that.  She understands there is still much room for improvement.  CBG's are trending well today.     Addendum:  Spoke with pt at bedside and he is unsure where Wells Guiles gives him his insulin.  He states, "maybe in the wrist".  He seems confused.  Wells Guiles called while I was in room and spoke with her about where his insulin is administered.  She administers in the abdomen or back of the arm.     Will continue to follow while inpatient.  Thank you, Reche Dixon, RN, BSN Diabetes Coordinator Inpatient Diabetes Program [phone] (team pager from 8a-5p)

## 2019-09-19 NOTE — TOC Initial Note (Signed)
Transition of Care [facility]) - Initial/Assessment Note    Patient Details  Name: Daniel [name] MRN: [id number] Date of Birth: 04-27-1965  [facility]) CM/SW Contact:    Pollie Friar, RN Phone Number: 09/19/2019, 4:06 PM  Clinical Narrative:                 Pt lives home with spouse who he says can provide 24 hour supervision. Pt denies issues with transportation.  Recommendations for [facility] services and [facility] selected. Cassie with [facility] accepted there referral.  TOC following for further d/c needs.   Expected Discharge Plan: [place] Barriers to Discharge: Continued Medical Work up   Patient Goals and CMS Choice   CMS Medicare.gov Compare Post Acute Care list provided to:: Patient Choice offered to / list presented to : Patient  Expected Discharge Plan and Services Expected Discharge Plan: [place]   Discharge Planning Services: CM Consult Post Acute Care Choice: [place] arrangements for the past 2 months: Single Family Home                           HH Arranged: PT, OT [facility] Agency: [facility] Date [place]: 09/19/19   Representative spoke with at [place]: Cassie  Prior Living Arrangements/Services Living arrangements for the past 2 months: [place] with:: Spouse Patient language and need for interpreter reviewed:: Yes Do you feel safe going back to the place where you live?: Yes      Need for Family Participation in Patient Care: Yes (Comment) Care giver support system in place?: Yes (comment) Current home services: DME (cane, walker, bipap) Criminal Activity/Legal Involvement Pertinent to Current Situation/Hospitalization: No - Comment as needed  Activities of Daily Living      Permission Sought/Granted                  Emotional Assessment Appearance:: Appears stated age Attitude/Demeanor/Rapport: Engaged Affect (typically observed):  Accepting Orientation: : Oriented to Self, Oriented to Place, Oriented to Situation, Oriented to  Time   Psych Involvement: No (comment)  Admission diagnosis:  Hyperglycemia [R73.9] CVA (cerebral vascular accident) ([place]) [I63.9] Acute ischemic stroke ([place]) [I63.9] Encounter for imaging to screen for metal prior to magnetic resonance imaging (MRI) [Z13.89] Patient Active Problem List   Diagnosis Date Noted  . CVA (cerebral vascular accident) ([place]) 09/18/2019  . Acute metabolic encephalopathy 53/66/4403  . Volume depletion   . Hyperglycemia 08/18/2019  . Left foot drop 04/12/2018  . Abnormality of gait 12/14/2017  . Labile blood glucose   . Sleep disturbance   . Peripheral edema   . Hyponatremia   . Stage 2 chronic kidney disease   . Debility 11/22/2017  . Morbid obesity ([place])   . Seizures ([place])   . OSA (obstructive sleep apnea)   . Noncompliance   . FUO (fever of unknown origin)   . Acute on chronic kidney failure ([place])   . Diabetes mellitus type 2 in nonobese (H[facility])   . Diabetes mellitus type 2 in obese ([place])   . Acute blood loss anemia   . Labile blood pressure   . Fever   . Status epilepticus ([place])   . Recurrent seizures ([place]) 10/27/2017  . Altered sensorium 10/27/2017  . Dyspnea on exertion 08/14/2017  . Chronic respiratory failure with hypoxia ([place]) 08/14/2017  . Sleep apnea 08/14/2017  . Obesity hypoventilation syndrome ([place]) 08/14/2017  .  Fatigue 08/14/2017  . Hypertension 08/14/2017  . Hyperlipidemia 12/23/2016  . Hypothyroid 12/23/2016  . Acute combined systolic and diastolic congestive heart failure ([place]) 06/24/2016  . CKD (chronic kidney disease), stage III 06/24/2016  . Type 2 diabetes mellitus without complications () 22/29/7989   PCP:  Antonietta Jewel, MD Pharmacy:   [place], [place] - [address] [place] [address] Phone: [phone] Fax: [phone]     Social Determinants of Health (SDOH)  Interventions    Readmission Risk Interventions No flowsheet data found.

## 2019-09-19 NOTE — Progress Notes (Signed)
Patient had a stat ABG and results did not cross over into Epic.  Results noted below:    pH: 7.33 CO2: 43 PAO2: 68 HCO3: 23   Saturation: 93 % on 4L [place]  Temp: 97.7  Right Radial   Patient was placed on BIPAP for increased work of breathing per MD verbal order. RT will continue to monitor patient.

## 2019-09-19 NOTE — Progress Notes (Signed)
PROGRESS NOTE    Daniel [name]  DQQ:[id number] DOB: 1965/11/24 DOA: 09/18/2019 PCP: Antonietta Jewel, MD   Brief Narrative:  Patient is a 54 year old male with past medical history of hypertension, hyperlipidemia, hypothyroidism, CVA, diabetes type 2, seizure, PE, morbid obesity, OSA on CPAP, CKD stage IIIb who presented with complaints of shortness of breath.  He was confused on presentation.  Altered mental status was observed for last 2 days before admission.  MRI done in the emergency department showed 6 mm acute infarct in the right temporoparietal white matter.  Neurology consulted.  Stroke work-up initiated.  Hospital course remarkable for acute respiratory failure with hypoxia, severe hypertension.  Assessment & Plan:   Principal Problem:   CVA (cerebral vascular accident) ([place]) Active Problems:   CKD (chronic kidney disease), stage III   Hyperlipidemia   OSA (obstructive sleep apnea)   Diabetes mellitus type 2 in obese (HCC)   Acute metabolic encephalopathy   Acute nonhemorrhagic CVA: Presented with confusion for last 2 days before admission.  MRI showed 6 mm acute infarct in the right temporoparietal white matter.Also showed mall chronic lacunar infarcts within the bilateral frontal lobe white matter, right basal ganglia and left cerebellum  MRA showed no large intracranial artery occlusion but showed high-grade stenosis or occlusion, basilar artery severe stenosis,moderate to marked stenosis of the clinoid and proximal supraclinoid right ICA.Mild to moderate stenosis of the right M1 MCA.   Echo showed ejection fraction of 40 to 45%, no intracardiac source of emboli.  Pending carotid Doppler. Stroke work-up initiated.  PT/OT/speech evaluation.  Started on aspirin, statin. He has uncontrolled diabetes mellitus, hyperlipidemia which could have contributed to the CVA. We will check new lipid panel. hemoglobin A1c of 13.3  Acute encephalopathy: Most likely secondary to underlying  stroke.  UDS negative.  Monitor mental status.  Currently he is oriented to place and person but not time.  Acute respiratory failure with hypoxia: Chest x-ray showed interstitial edema.  Elevated BNP.  Echocardiogram pending.  This could be contributed by flash pulmonary edema from severe hypertension.  Given a dose of Lasix 40 mg IV once.  Monitor respiratory status.  Currently on 4 L of oxygen per minute.  We will continue to taper the oxygen.  Systolic heart failure: He looks mildly volume overloaded.  BNP was elevated.  Echocardiogram done here showed ejection fraction of 40 to 45%.  Cardiology consulted  Uncontrolled diabetes mellitus type 2: His hemoglobin A1c is 13.3  .Continue current insulin regimen.  He takes insulin at home.  Diabetic coordinator  consulted.    Hyperlipidemia: LDL was 217 as per 08/18/2019.  Will check new lipid panel.  Continue Lipitor  CKD stage IIIb: Creatinine noted to be 2.29 which appears to be similar to previous hospitalization last month.  Continue to monitor kidney function.  Creatinine slightly trended up today.  Will check urine sodium and creatinine  Abnormal UA: UA suspicious for UTI.  Urine culture sent.  No indication for antibiotic therapy for now.  Hypertension: Hypertensive currently.  Continue as needed medications for severe hypertension.  Allow permissive hypertension  Hypothyroidism: Continue with thyroxine  Depression: Citalopram  OSA/morbid obesity: BMI of 42.4.  On CPAP at night       DVT prophylaxis: Lovenox Code Status:Full  Family Communication: called spouse twice,call not received Status is: Inpatient  Remains inpatient appropriate because:Hemodynamically unstable   Dispo: The patient is from: Home              Anticipated  d/c is to: Home              Anticipated d/c date is: 2-3 days              Patient currently is not medically stable to d/c.     Consultants: Cardiology, neurology  Procedures:  None  Antimicrobials:  Anti-infectives (From admission, onward)   None      Subjective: Patient seen and examined at the bedside this morning.  Hemodynamically stable during my evaluation.  He was hypertensive earlier.  Also developed severe respiratory distress.  He looked comfortable during my evaluation.  Alert and awake but oriented to place and person only.  No focal neurological deficits.  Objective: Vitals:   09/19/19 0132 09/19/19 0321 09/19/19 0629 09/19/19 0651  BP: (!) 159/63 (!) 153/69  (!) 175/102  Pulse:  79  (!) 130  Resp: 18 18  (!) 28  Temp: 97.8 F (36.6 C) 97.9 F (36.6 C)  (!) 97.5 F (36.4 C)  TempSrc: Oral Oral    SpO2:  95% 98% 98%  Weight:  (!) 134.3 kg      Intake/Output Summary (Last 24 hours) at 09/19/2019 9233 Last data filed at 09/19/2019 0725 Gross per 24 hour  Intake --  Output 275 ml  Net -275 ml   Filed Weights   09/18/19 1900 09/19/19 0321  Weight: (!) 134 kg (!) 134.3 kg    Examination:  General exam: Not in distress,morbidly obese HEENT:PERRL,Oral mucosa moist, Ear/Nose normal on gross exam Respiratory system: diminished air sounds on the bases Cardiovascular system: S1 & S2 heard, RRR. No JVD, murmurs, rubs, gallops or clicks. No pedal edema. Gastrointestinal system: Abdomen is nondistended, soft and nontender. No organomegaly or masses felt. Normal bowel sounds heard. Central nervous system: Alert and oriented to place and person only. No focal neurological deficits. Extremities: No edema, no clubbing ,no cyanosis Skin: No rashes, lesions or ulcers,no icterus ,no pallor   Data Reviewed: I have personally reviewed following labs and imaging studies  CBC: Recent Labs  Lab 09/18/19 0620 09/18/19 0826  WBC 8.4  --   HGB 17.2* 17.0  HCT 55.2* 50.0  MCV 86.5  --   PLT 283  --    Basic Metabolic Panel: Recent Labs  Lab 09/18/19 0620 09/18/19 0826  NA 133* 132*  K 4.9 5.1  CL 99  --   CO2 25  --   GLUCOSE 350*  --    BUN 25*  --   CREATININE 2.29*  --   CALCIUM 8.2*  --    GFR: Estimated Creatinine Clearance: 50.3 mL/min (A) (by C-G formula based on SCr of 2.29 mg/dL (H)). Liver Function Tests: Recent Labs  Lab 09/18/19 0739  AST 20  ALT 13  ALKPHOS 60  BILITOT 0.9  PROT 6.1*  ALBUMIN 2.4*   No results for input(s): LIPASE, AMYLASE in the last 168 hours. No results for input(s): AMMONIA in the last 168 hours. Coagulation Profile: No results for input(s): INR, PROTIME in the last 168 hours. Cardiac Enzymes: No results for input(s): CKTOTAL, CKMB, CKMBINDEX, TROPONINI in the last 168 hours. BNP (last 3 results) No results for input(s): PROBNP in the last 8760 hours. HbA1C: No results for input(s): HGBA1C in the last 72 hours. CBG: Recent Labs  Lab 09/18/19 0606 09/18/19 1214 09/18/19 1623 09/18/19 2141 09/19/19 0557  GLUCAP 375* 348* 242* 212* 136*   Lipid Profile: No results for input(s): CHOL, HDL, LDLCALC, TRIG, CHOLHDL, LDLDIRECT in  the last 72 hours. Thyroid Function Tests: No results for input(s): TSH, T4TOTAL, FREET4, T3FREE, THYROIDAB in the last 72 hours. Anemia Panel: No results for input(s): VITAMINB12, FOLATE, FERRITIN, TIBC, IRON, RETICCTPCT in the last 72 hours. Sepsis Labs: No results for input(s): PROCALCITON, LATICACIDVEN in the last 168 hours.  Recent Results (from the past 240 hour(s))  SARS Coronavirus 2 by RT PCR (hospital order, performed in [facility] hospital lab) Nasopharyngeal Nasopharyngeal Swab     Status: None   Collection Time: 09/18/19  8:50 AM   Specimen: Nasopharyngeal Swab  Result Value Ref Range Status   SARS Coronavirus 2 NEGATIVE NEGATIVE Final    Comment: (NOTE) SARS-CoV-2 target nucleic acids are NOT DETECTED.  The SARS-CoV-2 RNA is generally detectable in upper and lower respiratory specimens during the acute phase of infection. The lowest concentration of SARS-CoV-2 viral copies this assay can detect is 250 copies / mL. A negative  result does not preclude SARS-CoV-2 infection and should not be used as the sole basis for treatment or other patient management decisions.  A negative result may occur with improper specimen collection / handling, submission of specimen other than nasopharyngeal swab, presence of viral mutation(s) within the areas targeted by this assay, and inadequate number of viral copies (<250 copies / mL). A negative result must be combined with clinical observations, patient history, and epidemiological information.  Fact Sheet for Patients:   StrictlyIdeas.no  Fact Sheet for Healthcare Providers: BankingDealers.co.za  This test is not yet approved or  cleared by the [place] FDA and has been authorized for detection and/or diagnosis of SARS-CoV-2 by FDA under an Emergency Use Authorization (EUA).  This EUA will remain in effect (meaning this test can be used) for the duration of the COVID-19 declaration under Section 564(b)(1) of the Act, 21 U.S.C. section 360bbb-3(b)(1), unless the authorization is terminated or revoked sooner.  Performed at [facility], [place] [address] [place], [place] [address]          Radiology Studies: DG Skull 1-3 Views  Result Date: 09/18/2019 CLINICAL DATA:  Screening for MRI. EXAM: SKULL - 1-3 VIEW COMPARISON:  None. FINDINGS: No metallic foreign body. No fracture or bone lesion. Clear sinuses. Soft tissues are unremarkable. IMPRESSION: Negative.  No metal foreign body. Electronically Signed   By: Lajean Manes M.D.   On: 09/18/2019 11:33   DG Neck Soft Tissue  Result Date: 09/18/2019 CLINICAL DATA:  Screening for MRI. EXAM: NECK SOFT TISSUES - 1+ VIEW COMPARISON:  None. FINDINGS: No metallic foreign body. Normal soft tissues.  Airway is widely patent. Mild disc degenerative changes at C5-C6 and C6-C7 with mild loss of disc height and endplate spurring. No skeletal abnormality. IMPRESSION: 1. No  metallic foreign body.  No acute finding. Electronically Signed   By: Lajean Manes M.D.   On: 09/18/2019 11:35   DG Chest 1 View  Result Date: 09/19/2019 CLINICAL DATA:  Shortness of breath.  History of CHF. EXAM: CHEST  1 VIEW COMPARISON:  09/18/2019. FINDINGS: Cardiomegaly. Mild bilateral interstitial prominence. Findings consistent with CHF with bilateral interstitial edema. No pleural effusions or pneumothorax. IMPRESSION: Cardiomegaly with mild bilateral interstitial prominence consistent with interstitial edema. Pneumonitis cannot be excluded. Electronically Signed   By: Marcello Moores  Register   On: 09/19/2019 06:42   DG Chest 2 View  Result Date: 09/18/2019 CLINICAL DATA:  Syncope EXAM: CHEST - 2 VIEW COMPARISON:  August 18, 2019 FINDINGS: Lungs are clear. Heart is upper normal in size with  pulmonary vascularity normal. No evident adenopathy. There is degenerative change in the lower thoracic and upper lumbar regions. IMPRESSION: Lungs clear. Heart upper normal in size. No adenopathy appreciable. Electronically Signed   By: Lowella Grip III M.D.   On: 09/18/2019 08:21   DG Pelvis 1-2 Views  Result Date: 09/18/2019 CLINICAL DATA:  Screening for MRI. EXAM: PELVIS - 1-2 VIEW COMPARISON:  12/09/2017 FINDINGS: No fracture or bone lesion. The SI joints, pubic symphysis and hip joints are normally spaced and aligned. Degenerative disc changes are noted of the lower lumbar spine. Soft tissues are unremarkable. No metallic foreign bodies. IMPRESSION: 1. No metallic foreign bodies. 2. No fracture, bone lesion or joint abnormality of the pelvis or hips. Electronically Signed   By: Lajean Manes M.D.   On: 09/18/2019 11:33   DG Abd 1 View  Result Date: 09/18/2019 CLINICAL DATA:  Screening for MRI. EXAM: ABDOMEN - 1 VIEW COMPARISON:  10/28/2017 FINDINGS: No metallic foreign body. Normal bowel gas pattern. No evidence of a soft tissue mass or organomegaly. No evidence of renal or ureteral stones. IMPRESSION:  1. No metallic foreign body. 2. No acute findings. Electronically Signed   By: Lajean Manes M.D.   On: 09/18/2019 11:35   MR ANGIO HEAD WO CONTRAST  Result Date: 09/18/2019 CLINICAL DATA:  Acute stroke on MRI brain EXAM: MRA HEAD WITHOUT CONTRAST TECHNIQUE: Angiographic images of the Circle of Willis were obtained using MRA technique without intravenous contrast. COMPARISON:  None. FINDINGS: Intracranial internal carotid arteries are patent. There is moderate to marked stenosis of the clinoid and proximal supraclinoid portions of the right ICA. Middle and anterior cerebral arteries are patent. Mild to moderate stenosis of the right M1 MCA. Intracranial vertebral arteries are not definitely identified. There appear to be patent posteroinferior cerebellar arteries. 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 Posterior cerebral arteries are patent. Bilateral posterior communicating arteries are present. There is no aneurysm. IMPRESSION: Intracranial vertebral arteries not definitely identified with high-grade stenosis or occlusion. 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 Posterior cerebral arteries are patent. There are large bilateral posterior communicating arteries. Moderate to marked stenosis of the clinoid and proximal supraclinoid right ICA. Mild to moderate stenosis of the right M1 MCA. Electronically Signed   By: Macy Mis M.D.   On: 09/18/2019 16:38   MR BRAIN WO CONTRAST  Result Date: 09/18/2019 CLINICAL DATA:  Provided history: Mental status change, unknown cause. EXAM: MRI HEAD WITHOUT CONTRAST TECHNIQUE: Multiplanar, multiecho pulse sequences of the brain and surrounding structures were obtained without intravenous contrast. COMPARISON:  Head CT 08/18/2019, brain MRI 10/25/2017 FINDINGS: Brain: Mild generalized  parenchymal atrophy. The examination is intermittently motion degraded, limiting evaluation. Most notably, there is moderate/severe motion degradation of the axial SWI sequence. 6 mm focus of restricted diffusion consistent with acute infarction within the right temporoparietal white matter, immediately posterolateral to the right thalamus (series 2, image 27). Mild T2 shine through at site of chronic lacunar infarcts within the frontal lobe white matter bilaterally (series 2, image 37). Small chronic lacunar infarcts within the cerebral white matter and right basal ganglia are new as compared to the prior MRI of 10/25/2017. Redemonstrated small chronic lacunar infarcts within the inferior left cerebellum. Background mild patchy T2/FLAIR hyperintensity within the cerebral white matter is nonspecific, but consistent with chronic small vessel ischemic disease. These findings  have progressed as compared to the prior MRI. No evidence of intracranial mass. No definite chronic intracranial blood products are identified on the significantly motion degraded SWI sequence. No extra-axial fluid collection. No midline shift. Vascular: Expected proximal arterial flow voids. Skull and upper cervical spine: No focal marrow lesion. Sinuses/Orbits: Visualized orbits show no acute finding. Paranasal sinus disease. Most notably, there is moderate mucosal thickening within the ethmoid, sphenoid and left maxillary sinuses. Trace fluid within right mastoid air cells. IMPRESSION: Motion degraded examination as described. 6 mm acute infarct within the right temporoparietal white matter, immediately posterolateral to the right thalamus. Small chronic lacunar infarcts within the bilateral frontal lobe white matter, right basal ganglia and left cerebellum. The frontal lobe white matter and right basal ganglia lacunar infarcts were not present on the prior MRI of 10/25/2017. Background mild generalized parenchymal atrophy and progressive mild  chronic small vessel ischemic changes within the cerebral white matter. Pansinusitis, most notably ethmoid, sphenoid and left maxillary. Trace right mastoid effusion. Electronically Signed   By: Kellie Simmering DO   On: 09/18/2019 12:36   EEG adult  Result Date: 09/18/2019 Lora Havens, MD     09/18/2019  9:58 PM Patient Name: Daniel [name] MRN: [id number] Epilepsy Attending: Lora Havens Referring Physician/Provider: Dr Roland Rack Date: 09/18/2019 Duration: 20.46 mins Patient history: This is a 54 year old male presenting to the hospital for initial shortness of breath.  On the hospital wife noted that he has been confused. EEG to evaluate for seizure. Level of alertness: Awake/ lethargic AEDs during EEG study: None Technical aspects: This EEG study was done with scalp electrodes positioned according to the 10-20 International system of electrode placement. Electrical activity was acquired at a sampling rate of 500Hz  and reviewed with a high frequency filter of 70Hz  and a low frequency filter of 1Hz . EEG data were recorded continuously and digitally stored. Description: The posterior dominant rhythm consists of 8 Hz activity of moderate voltage (25-35 uV) seen predominantly in posterior head regions, symmetric and reactive to eye opening and eye closing. EEG showed intermittent generalized rhythmic 3 to 6 Hz theta-delta slowing. Hyperventilation and photic stimulation were not performed.   ABNORMALITY -Intermittent rhythmic slow, generalized IMPRESSION: This study is suggestive of mild diffuse encephalopathy, nonspecific etiology. No seizures or epileptiform discharges were seen throughout the recording. Priyanka Barbra Sarks        Scheduled Meds: . aspirin  300 mg Rectal Daily   Or  . aspirin  325 mg Oral Daily  . atorvastatin  80 mg Oral QHS  . citalopram  20 mg Oral Daily  . enoxaparin (LOVENOX) injection  60 mg Subcutaneous Q24H  . furosemide  40 mg Intravenous Once  . insulin aspart   0-20 Units Subcutaneous TID WC  . insulin aspart protamine- aspart  60 Units Subcutaneous BID WC  . levothyroxine  50 mcg Oral QAC breakfast  . lisinopril  20 mg Oral Daily   Continuous Infusions:   LOS: 1 day    Time spent: 35 mins.More than 50% of that time was spent in counseling and/or coordination of care.      Shelly Coss, MD [facility] P7/29/2021, 8:21 AM

## 2019-09-19 NOTE — Progress Notes (Signed)
Pt off unit for test

## 2019-09-19 NOTE — Significant Event (Signed)
Rapid Response Event Note  Overview: Tachypnea and increased WOB  Initial Focused Assessment: Nursing staff notified me of pt with tachypnea and increased WOB. Dr. Marlyce Huge came to bedside. Upon my arrival, pt was lethargic and could answer some questions appropriately but would go right off to sleep. MAE with generalized weakness. CBG 137. Skin is hot, pink and dry. BBS diminished with upper airway expiratory wheeze. Pt placed on BIPAP for increased WOB and WOB improved with Bipap. Discussed POC with Dr. Marlyce Huge.    Interventions: -ABG -Bipap  Plan of Care (if not transferred): -HOB greater than 30 degrees -may wean BIPAP as WOB improves  Event Summary: Call received [phone] Arrived at call 0630 (at previous emergency) Call ended [phone]  Madelynn Done

## 2019-09-19 NOTE — Progress Notes (Signed)
Per RN Aaron Edelman patient keeps pulling at everything so he feels EEG leads will not be any different. Will speak with Neurology and figure out another plan

## 2019-09-19 NOTE — Evaluation (Signed)
Occupational Therapy Evaluation Patient Details Name: Daniel [name] MRN: [id number] DOB: 12-27-1965 Today's Date: 09/19/2019    History of Present Illness Pt is 54 yo male with PMH of HTN, HLD, hypothyrooidism, CVA, DM2, seizure, morbid obesity, OSA, kidney disease stage III who presented to ED with [facility]. Per notes pt has had declining mental status over last 2 years in regards to memory loss, however, acutely worsened over past 2 days and became verbally abusive which is abnormal.  Pt was found to have acute infarct 6 mm in R temporoparietal white matter and acute metabolic encephalopathy. Pt had rapid response earlier this morning due to [facility], but has stabilized and cleared for PT/OT per RN.   Clinical Impression   PTA pt reports being independent, but pt limited historian and reported working even though he is not. Pt was admitted for above and treated for problem list below (see OT Problem List). Pt is A&Ox1 disoriented to time, place, and situation. Pt impulsive at times; did make occasional inappropriate comments. Requires supervision - min A with verbal cues for attention to task with ADLs due to decreased cognition and balance. Requires min guard - min A with transfers and ambulation with verbal cues for safety when walking in hallway. Pt on room air and VSS and monitored O2 93-94 throughout session . Believe pt would benefit from skilled OT services acutely and at the [facility] with 24/7 assist level to increase safety and independence. IF pt not able to obtain 24/7 supervision assist - will have to recommend SNF.    Follow Up Recommendations  Home health OT;Supervision/Assistance - 24 hour;SNF    Equipment Recommendations  None recommended by OT       Precautions / Restrictions Precautions Precautions: Fall Precaution Comments: impulsive      Mobility Bed Mobility Overal bed mobility: Needs Assistance Bed Mobility: Supine to Sit     Supine to sit: Min guard     General bed  mobility comments: min guard for safety  Transfers Overall transfer level: Needs assistance Equipment used: None Transfers: Sit to/from Stand Sit to Stand: Min guard;Min assist         General transfer comment: first stand unsuccessful due to back spasm and required min A for steadying. Performed sit<>stand x3 with min guard for safety    Balance Overall balance assessment: Needs assistance Sitting-balance support: No upper extremity supported;Feet supported Sitting balance-Leahy Scale: Fair Sitting balance - Comments: Could maintain static balance but leaned posteriorly with donning socks and LE MMT, had 1 LOB posteeriorly - reports back spasm   Standing balance support: No upper extremity supported Standing balance-Leahy Scale: Fair Standing balance comment: initially requiring min A for steadying but progressed to min guard for safety                           ADL either performed or assessed with clinical judgement   ADL Overall ADL's : Needs assistance/impaired     Grooming: Supervision/safety;Sitting;Standing;Cueing for safety Grooming Details (indicate cue type and reason): verbal cues for safety and attention to task Upper Body Bathing: Supervision/ safety;Sitting;Cueing for safety Upper Body Bathing Details (indicate cue type and reason): pt able to don gown with supervision Lower Body Bathing: Sitting/lateral leans;Sit to/from stand;Minimal assistance;Cueing for safety Lower Body Bathing Details (indicate cue type and reason): Min A with sitting balance for LB ADLs Upper Body Dressing : Supervision/safety;Sitting;Standing;Cueing for safety Upper Body Dressing Details (indicate cue type and reason): supervision  with cues for safety Lower Body Dressing: Cueing for safety;Minimal assistance;Sitting/lateral leans;Sit to/from stand Lower Body Dressing Details (indicate cue type and reason): Min A for sitting balance with verbal cues for safety when donning  socks Toilet Transfer: Min guard;Ambulation Toilet Transfer Details (indicate cue type and reason): simulated to recliner Toileting- Clothing Manipulation and Hygiene: Supervision/safety;Cueing for safety;Cueing for sequencing;Sitting/lateral lean;Sit to/from stand Toileting - Clothing Manipulation Details (indicate cue type and reason): supervision due to cognition   Tub/Shower Transfer Details (indicate cue type and reason): deferred due to pt impulsivity Functional mobility during ADLs: Min guard;Minimal assistance General ADL Comments: Pt with decreased cognition and decreased balance                  Pertinent Vitals/Pain Pain Assessment: Faces Faces Pain Scale: Hurts a little bit Pain Location: Reports one episode of back spasm that resolved Pain Descriptors / Indicators: Grimacing;Discomfort;Spasm Pain Intervention(s): Monitored during session;Repositioned     Hand Dominance Right   Extremity/Trunk Assessment Upper Extremity Assessment Upper Extremity Assessment: Overall WFL for tasks assessed   Lower Extremity Assessment Lower Extremity Assessment: Defer to PT evaluation   Cervical / Trunk Assessment Cervical / Trunk Assessment: Other exceptions (obese)   Communication Communication Communication: No difficulties   Cognition Arousal/Alertness: Awake/alert Behavior During Therapy: Impulsive Overall Cognitive Status: No family/caregiver present to determine baseline cognitive functioning Area of Impairment: Orientation;Problem solving;Safety/judgement;Following commands;Memory;Awareness                 Orientation Level: Disoriented to;Place;Time;Situation   Memory: Decreased short-term memory;Decreased recall of precautions Following Commands: Follows one step commands consistently;Follows multi-step commands inconsistently Safety/Judgement: Decreased awareness of safety;Decreased awareness of deficits Awareness: Intellectual Problem Solving: Difficulty  sequencing;Requires verbal cues General Comments: Pt impulsive at times; did make occasional inappropriate comment   General Comments  Pt impulsive and had moments for verbal inappropriateness. RN notified about pt up in chair with chair alarm due to impulsivity            Home Living Family/patient expects to be discharged to:: Unsure Living Arrangements: Spouse/significant other Available Help at Discharge: Family;Available PRN/intermittently Type of Home: House Home Access: Stairs to enter [employer] of Steps: 6 Entrance Stairs-Rails: Right;Left Home Layout: One level     Bathroom Shower/Tub: Teacher, early years/pre: Standard     Home Equipment: Environmental consultant - 2 wheels;Bedside commode;Shower seat;Hand held shower head   Additional Comments: Pt is questionable historian but home situation does agree with prior visits  Lives With: Spouse    Prior Functioning/Environment Level of Independence: Independent        Comments: Pt reports independent with ADLs, IADLs, and ambulation without AD but is questionable historain.  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        OT Problem List: Decreased activity tolerance;Impaired balance (sitting and/or standing);Decreased cognition;Decreased safety awareness;Decreased knowledge of use of DME or AE;Decreased knowledge of precautions;Pain;Obesity      OT Treatment/Interventions: Self-care/ADL training;Therapeutic exercise;Energy conservation;DME and/or AE instruction;Therapeutic activities;Cognitive remediation/compensation;Patient/family education;Balance training    OT Goals(Current goals can be found in the care plan section) Acute Rehab OT Goals Patient Stated Goal: go home OT Goal Formulation: With patient Time For Goal Achievement: 10/03/19 Potential to Achieve Goals: Good  OT Frequency: Min 2X/week           Co-evaluation PT/OT/SLP Co-Evaluation/Treatment:  Yes Reason for Co-Treatment: Complexity of the patient's impairments (multi-system involvement);Necessary to address cognition/behavior during  functional activity;For patient/therapist safety;To address functional/ADL transfers PT goals addressed during session: Mobility/safety with mobility OT goals addressed during session: ADL's and self-care;Proper use of Adaptive equipment and DME      AM-PAC OT "6 Clicks" Daily Activity     Outcome Measure Help from another person eating meals?: None Help from another person taking care of personal grooming?: A Little Help from another person toileting, which includes using toliet, bedpan, or urinal?: A Little Help from another person bathing (including washing, rinsing, drying)?: A Little Help from another person to put on and taking off regular upper body clothing?: A Little Help from another person to put on and taking off regular lower body clothing?: A Little 6 Click Score: 19   End of Session Equipment Utilized During Treatment: Gait belt Nurse Communication: Mobility status  Activity Tolerance: Patient tolerated treatment well Patient left: in chair;with call bell/phone within reach;with chair alarm set  OT Visit Diagnosis: Unsteadiness on feet (R26.81);Other symptoms and signs involving the nervous system (R29.898);Other symptoms and signs involving cognitive function;Pain Pain - part of body:  (back spasm)                Time: 4888-9169 OT Time Calculation (min): 30 min Charges:  OT General Charges $OT Visit: 1 Visit OT Evaluation $OT Eval Moderate Complexity: 1 Mod  Charlese Gruetzmacher/OTS  Thalya Fouche 09/19/2019, 12:54 PM

## 2019-09-20 ENCOUNTER — Inpatient Hospital Stay (HOSPITAL_COMMUNITY): Payer: Medicare Other

## 2019-09-20 DIAGNOSIS — I639 Cerebral infarction, unspecified: Secondary | ICD-10-CM | POA: Diagnosis not present

## 2019-09-20 LAB — GLUCOSE, CAPILLARY
Glucose-Capillary: 140 mg/dL — ABNORMAL HIGH (ref 70–99)
Glucose-Capillary: 128 mg/dL — ABNORMAL HIGH (ref 70–99)
Glucose-Capillary: 126 mg/dL — ABNORMAL HIGH (ref 70–99)
Glucose-Capillary: 130 mg/dL — ABNORMAL HIGH (ref 70–99)

## 2019-09-20 LAB — BASIC METABOLIC PANEL
Anion gap: 10 (ref 5–15)
BUN: 48 mg/dL — ABNORMAL HIGH (ref 6–20)
CO2: 26 mmol/L (ref 22–32)
Calcium: 8.1 mg/dL — ABNORMAL LOW (ref 8.9–10.3)
Chloride: 101 mmol/L (ref 98–111)
Creatinine, Ser: 3.51 mg/dL — ABNORMAL HIGH (ref 0.61–1.24)
GFR calc Af Amer: 21 mL/min — ABNORMAL LOW (ref >60)
GFR calc non Af Amer: 18 mL/min — ABNORMAL LOW (ref >60)
Glucose, Bld: 121 mg/dL — ABNORMAL HIGH (ref 70–99)
Potassium: 4.5 mmol/L (ref 3.5–5.1)
Sodium: 137 mmol/L (ref 135–145)

## 2019-09-20 LAB — LIPID PANEL
Cholesterol: 222 mg/dL — ABNORMAL HIGH (ref 0–200)
HDL: 24 mg/dL — ABNORMAL LOW (ref >40)
LDL Cholesterol: 150 mg/dL — ABNORMAL HIGH (ref 0–99)
Total CHOL/HDL Ratio: 9.3 RATIO
Triglycerides: 240 mg/dL — ABNORMAL HIGH (ref <150)
VLDL: 48 mg/dL — ABNORMAL HIGH (ref 0–40)

## 2019-09-20 LAB — PROTEIN / CREATININE RATIO, URINE
Creatinine, Urine: 36.79 mg/dL
Protein Creatinine Ratio: 6.09 mg/mg{Cre} — ABNORMAL HIGH (ref 0.00–0.15)
Total Protein, Urine: 224 mg/dL

## 2019-09-20 LAB — HEPATITIS B SURFACE ANTIGEN: Hepatitis B Surface Ag: NONREACTIVE

## 2019-09-20 MED ORDER — EZETIMIBE 10 MG PO TABS
10.0000 mg | ORAL_TABLET | Freq: Every day | ORAL | Status: DC
  Administered 2019-09-20 – 2019-09-21 (×2): 10 mg via ORAL
  Filled 2019-09-20 (×2): qty 1

## 2019-09-20 NOTE — TOC Progression Note (Signed)
Transition of Care [facility]) - Progression Note    Patient Details  Name: MIDAS DAUGHETY MRN: [id number] Date of Birth: Mar 03, 1965  Transition of Care [facility] CM/SW Contact  Pollie Friar, RN Phone Number: 09/20/2019, 4:16 PM  Clinical Narrative:    Have attempted to reach wife on phone number listed without success. CM has left voicemail to see if pt has support at home he claims.  TOC following.   Expected Discharge Plan: [place] Barriers to Discharge: Continued Medical Work up  Expected Discharge Plan and Services Expected Discharge Plan: [place]   Discharge Planning Services: CM Consult Post Acute Care Choice: [place] arrangements for the past 2 months: Single Family Home                           HH Arranged: PT, OT [facility] Agency: [facility] Date [place]: 09/19/19   Representative spoke with at [place]: Cassie   Social Determinants of Health ([place]) Interventions    Readmission Risk Interventions No flowsheet data found.

## 2019-09-20 NOTE — Progress Notes (Signed)
STROKE TEAM PROGRESS NOTE   INTERVAL HISTORY No family at bedside.  Patient lying in bed, awake alert, still not fully orientated, but no focal neurological deficit.  Carotid Doppler unremarkable.  LDL 150.  Vitals:   09/19/19 2308 09/20/19 0316 09/20/19 0842 09/20/19 1141  BP: (!) 141/76 (!) 140/73 118/65 (!) 114/63  Pulse: 67 66 80 80  Resp: 18 18 20 20   Temp: 99 F (37.2 C) 98.6 F (37 C) 97.8 F (36.6 C) 98.3 F (36.8 C)  TempSrc: Oral Oral Oral Oral  SpO2: 99% 98% 97% 95%  Weight:  (!) 137.4 kg     CBC:  Recent Labs  Lab 09/18/19 0620 09/18/19 0620 09/18/19 0826 09/19/19 0928  WBC 8.4  --   --  12.3*  NEUTROABS  --   --   --  10.3*  HGB 17.2*   < > 17.0 15.6  HCT 55.2*   < > 50.0 48.9  MCV 86.5  --   --  84.7  PLT 283  --   --  227   < > = values in this interval not displayed.   Basic Metabolic Panel:  Recent Labs  Lab 09/19/19 0928 09/20/19 0300  NA 136 137  K 4.6 4.5  CL 103 101  CO2 23 26  GLUCOSE 167* 121*  BUN 37* 48*  CREATININE 2.93* 3.51*  CALCIUM 7.9* 8.1*   Lipid Panel:  Recent Labs  Lab 09/20/19 0300  CHOL 222*  TRIG 240*  HDL 24*  CHOLHDL 9.3  VLDL 48*  LDLCALC 150*   HgbA1c:  Recent Labs  Lab 09/19/19 0928  HGBA1C 13.3*   Urine Drug Screen:  Recent Labs  Lab 09/18/19 0915  LABOPIA NONE DETECTED  COCAINSCRNUR NONE DETECTED  LABBENZ NONE DETECTED  AMPHETMU NONE DETECTED  THCU NONE DETECTED  LABBARB NONE DETECTED    Alcohol Level  Recent Labs  Lab 09/18/19 0800  ETH <10    IMAGING past 24 hours US RENAL  Result Date: 09/19/2019 CLINICAL DATA:  Acute renal injury EXAM: RENAL / URINARY TRACT ULTRASOUND COMPLETE COMPARISON:  11/07/2017 FINDINGS: Right Kidney: Renal measurements: 12.1 x 6.5 x 4.9 cm. = volume: 201 mL. Mild increased echogenicity is noted. No obstructive changes are seen. Left Kidney: Renal measurements: 13.1 x 7.0 x 4.5 cm. = volume: 215 mL. Mild increased echogenicity is noted. No obstructive changes  are seen. Bladder: Partially distended Other: None. IMPRESSION: Increased echogenicity consistent with medical renal disease. No acute obstructive changes are seen. Electronically Signed   By: Inez Catalina M.D.   On: 09/19/2019 22:43    PHYSICAL EXAM  Temp:  [97.7 F (36.5 C)-100.3 F (37.9 C)] 98.3 F (36.8 C) (07/30 1141) Pulse Rate:  [66-89] 80 (07/30 1141) Resp:  [18-20] 20 (07/30 1141) BP: (114-141)/(63-76) 114/63 (07/30 1141) SpO2:  [94 %-99 %] 95 % (07/30 1141) Weight:  [137.4 kg] 137.4 kg (07/30 0316)  General - morbid obesity, well developed, in no apparent distress.  Ophthalmologic - fundi not visualized due to noncooperation.  Cardiovascular - Regular rhythm and rate.  Mental Status -  Level of arousal and orientation to self, DOB, place and person were intact, but not to age or time. Language including expression, naming, repetition, comprehension was assessed and found intact. Fund of Knowledge was assessed and was impaired.  Cranial Nerves II - XII - II - Visual field intact OU. III, IV, VI - Extraocular movements intact. V - Facial sensation intact bilaterally. VII - Facial movement  intact bilaterally. VIII - Hearing & vestibular intact bilaterally. X - Palate elevates symmetrically. XI - Chin turning & shoulder shrug intact bilaterally. XII - Tongue protrusion intact.  Motor Strength - The patient's strength was normal in all extremities and pronator drift was absent.  Bulk was normal and fasciculations were absent.   Motor Tone - Muscle tone was assessed at the neck and appendages and was normal.  Reflexes - The patient's reflexes were symmetrical in all extremities and he had no pathological reflexes.  Sensory - Light touch, temperature/pinprick were assessed and were symmetrical.    Coordination - The patient had normal movements in the hands with no ataxia or dysmetria.  Tremor was absent.  Gait and Station - deferred.   ASSESSMENT/PLAN Daniel [name] is a 54 y.o. male with history of stroke, seizure, pulmonary embolism, obstructive sleep apnea, migraines, hypertension, hypercholesterolemia, diabetes and chronic kidney disease presenting to ED w/ SOB and confusion x 2 days in setting of progressive memory loss over the past 2 years. MRI showed punctate R temporoparietal white matter infarct. Neuro consulted.     Stroke, incidental finding, punctate R temporoparietal periventricular white matter infarct secondary to small vessel disease source  MRI  punctate R temporoparietal white matter infarct. Old B frontal lobe white matter, R basal ganglia and L cerebellar lacunes.    MRA  hypoplastic posterior circulation vs. chronic BA severe stenosis. b/l large PCOMs, and PCAs patent. R clinoid and proximal supraclinoid ICA moderate to marked stenosis.   Carotid Doppler  B ICA 1-39% stenosis, VAs antegrade    2D Echo limited views post contrast. EF 40-45%. No obvious SOE  LDL 150   HgbA1c 13.3  VTE prophylaxis - Lovenox 60 mg sq daily   aspirin 81 mg daily prior to admission, now on aspirin 325 mg daily and clopidogrel 75 mg daily. Continue DAPT x 3 weeks then plavix alone    Therapy recommendations:  HH PT, HH OT  Disposition:  Return home  Seizure  Seen by Dr. Jannifer Franklin as an OP, last time 03/2018, off antiepileptics at that time  Previous symptomatic sz felt r/t hyperglycemia and HTN  EEG intermittent rhythmic slow  Continue follow up with Dr. Jannifer Franklin  Hypertension  Stable . Long-term BP goal normotensive . cardiology consulted. Plan to add bidil as OP as BP allows  Hyperlipidemia  Home meds:  No statin   now on lipitor 80  Add zetia  LDL 217 in June, repeat this time 150, goal < 70  Continue statin and zetia at discharge  Diabetes type II Uncontrolled w/ Peripheral Neuropathy  HgbA1c 13.3, goal < 7.0  On insulin  CBGs  SSI  Consulted diabetic educator  Close PCP follow-up for better DM control  Other  Stroke Risk Factors  Hx smokeless tobacco use (chew), quit 6 yrs ago  Hx ETOH use  Morbid Obesity, Body mass index is 43.46 kg/m., recommend weight loss, diet and exercise as appropriate   Hx stroke/TIA  11/13/2017 imaging showed old strokes, no documented medical hx otherwise   Migraines  Obstructive sleep apnea, on CPAP at home  Compensated systolic Congestive heart failure  Hx PE  Other Active Problems  Acute metabolic encephalopathy in setting of progressive cognitive decline  Acute encephalopathy  Acute respiratory failure w/ hypoxia, possible flash pulm edema from severe HTN. Treated w/ lasix.  Elevated BNP  AKI on CKD stage IIIb, creatinine 2.29->2.93. nephrology onboard. To quantify proteinuria, check serologies, do renal US.  Abnormal UA, f/u UCx no growth  Hypothyroidism on synthroid  Depression   L foot drop, peroneal neuropathy, resolved  B meralgia paresthetica   Hospital day # 2  Neurology will sign off. Please call with questions. Pt will follow up with Dr. Jannifer Franklin at [facility] in about 4 weeks. Thanks for the consult.   Rosalin Hawking, MD PhD Stroke Neurology 09/20/2019 3:02 PM   To contact Stroke Continuity provider, please refer to [email or web address] After hours, contact General Neurology

## 2019-09-20 NOTE — Progress Notes (Addendum)
Occupational Therapy Treatment Patient Details Name: Daniel [name] MRN: [id number] DOB: 09/25/1965 Today's Date: 09/20/2019    History of present illness Pt is 54 yo male with PMH of HTN, HLD, hypothyrooidism, CVA, DM2, seizure, morbid obesity, OSA, kidney disease stage III who presented to ED with [facility]. Per notes pt has had declining mental status over last 2 years in regards to memory loss, however, acutely worsened over past 2 days and became verbally abusive which is abnormal.  Pt was found to have acute infarct 6 mm in R temporoparietal white matter and acute metabolic encephalopathy. Pt had rapid response earlier this morning due to [facility], but has stabilized and cleared for PT/OT per RN.   OT comments  Upon arrival pt awake in bed and willing to participate in skilled-OT session. Pt continues to be slightly impulsive and disoriented. Pt needing verbal cues to problem solve the time and situation. Pt completeing 2/3 tasks from memory and with supervision and completing one task with min verbal cues. Pt unaware he met therapist yesterday and had the same responses to some questions as he did yesterday. Pt being able to recall why we were wearing masks today, which he could not recall yesterday. Pt making same jokes and statements as yesterday stating, "I'm a jokester. I really want to mess with ya'll, like lose my balance backwards and fall." Pt able to transfer to toilet and perform grooming at sink with supervision. Needing supervision to min guard for mobility due to decreased balance. Believe dc plans are still appropriate. IF going home, strongly recommend 24/7 assist/supervision due to decreased safety and cognition. Recommend NO driving, cooking, med management, and going out into the community by himself without supervision/assist.   Follow Up Recommendations  Home health OT;Supervision/Assistance - 24 hour;SNF    Equipment Recommendations  None recommended by OT       Precautions /  Restrictions Precautions Precautions: Fall Precaution Comments: impulsive Restrictions Weight Bearing Restrictions: No       Mobility Bed Mobility Overal bed mobility: Needs Assistance Bed Mobility: Sit to Supine     Supine to sit: Supervision Sit to supine: Modified independent (Device/Increase time)   General bed mobility comments: No assist needed, pt plopped back into bed.  Transfers Overall transfer level: Needs assistance Equipment used: None Transfers: Sit to/from Stand Sit to Stand: Min guard         General transfer comment: Min guard for safety as pt fast to rise. "I really want to mess with ya'll, like lose my balance backwards and fall."    Balance Overall balance assessment: Needs assistance Sitting-balance support: Feet supported;No upper extremity supported Sitting balance-Leahy Scale: Fair Sitting balance - Comments: able to adjust socks without difficulty.   Standing balance support: During functional activity Standing balance-Leahy Scale: Fair Standing balance comment: Some mild instability noted but no overt LOB. Min guard-supervision for safety.                           ADL either performed or assessed with clinical judgement   ADL Overall ADL's : Needs assistance/impaired     Grooming: Supervision/safety;Standing               Lower Body Dressing: Supervision/safety;Sitting/lateral leans Lower Body Dressing Details (indicate cue type and reason): pt able to pull up socks with supervision Toilet Transfer: Regular Toilet;Ambulation;Supervision/safety           Functional mobility during ADLs: Min guard;Supervision/safety General ADL  Comments: pt with decreased safety awareness, cognition, and decreased balance               Cognition Arousal/Alertness: Awake/alert Behavior During Therapy: Impulsive Overall Cognitive Status: No family/caregiver present to determine baseline cognitive functioning Area of  Impairment: Memory;Orientation;Following commands;Safety/judgement;Awareness                 Orientation Level: Disoriented to;Place;Time;Situation   Memory: Decreased short-term memory Following Commands: Follows one step commands consistently Safety/Judgement: Decreased awareness of safety;Decreased awareness of deficits Awareness: Intellectual Problem Solving: Difficulty sequencing;Requires verbal cues Pt impulsive and disoriented. Pt needing verbal cues to problem solve the time and situation. Pt completeing 2/3 tasks from memory and with supervision and 1/3 with min verbal cues. Pt unaware he met therapist yesterday and had the same responses to some questions as he did yesterday. Pt being able to recall why we were wearing masks today, which he could not recall yesterday. Pt making same jokes and statements as yesterday stating, "I'm a jokester. I really want to mess with ya'll, like lose my balance backwards and fall."              General Comments Pt impulsive and some moments of verbal innapropriateness noted. Memory somewhat better than yesterday but needing verbal cues for seqeuncing and problem solving through cognition. Pt using environmental cues to answer questions.    Pertinent Vitals/ Pain       Pain Assessment: No/denies pain         Frequency  Min 2X/week        Progress Toward Goals  OT Goals(current goals can now be found in the care plan section)  Progress towards OT goals: Progressing toward goals  Acute Rehab OT Goals Patient Stated Goal: go home OT Goal Formulation: With patient Time For Goal Achievement: 10/03/19 Potential to Achieve Goals: Good  Plan Discharge plan remains appropriate          End of Session Equipment Utilized During Treatment: Gait belt  OT Visit Diagnosis: Unsteadiness on feet (R26.81);Other symptoms and signs involving the nervous system (R29.898);Other symptoms and signs involving cognitive function   Activity  Tolerance Patient tolerated treatment well   Patient Left in bed;with call bell/phone within reach;with bed alarm set   Nurse Communication Mobility status      Time: 2111-7356 OT Time Calculation (min): 13 min  Charges: OT General Charges $OT Visit: 1 Visit OT Treatments $Self Care/Home Management : 8-22 mins  Starlina Lapre/OTS   Lathaniel Legate 09/20/2019, 3:31 PM

## 2019-09-20 NOTE — Consult Note (Signed)
TAJI BARRETTO Admit Date: 09/18/2019 09/20/2019 Rexene Agent Requesting Physician:  Tawanna Solo MD  Reason for Consult:  AoCKD3 HPI:  43M PMH including DM2 (longstanding, DPN present), hyperlipidemia, hypothyroidism, history of seizures, hypertension (on ACE inhibitor) history of PE, obesity, OSA on CPAP, CKD 3B was admitted on 7/28 with altered mental status and dyspnea.  Work-up included MRI with finding of small acute ischemic temporoparietal infarct.  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  He has been followed by neurology, where medical management has been to plan for his CVA, he has not received IV contrast or thrombolytics.  Trend in serum creatinine is below, he has baseline CKD 3.  He did see a kidney doctor in [facility], with BMI, at one point but is unclear when that was.  His presenting creatinine was 2.3 which is near his baseline and has worsened to 3.5 here.  Urinalysis at presentation with heavy proteinuria, microscopic hematuria, some pyuria.  Urine cultures no growth to date.  Renal ultrasound demonstrated normal-sized kidneys without any evidence of obstruction.  He is nonoliguric.  No use of NSAIDs prior to presentation.  He does take an ACE inhibitor which was continued until it was stopped yesterday.  No significant petechia, purpura, hemoptysis, recurrent epistaxis, tea colored urine.  No recent antibiotics.  Blood pressure has been stable.  Platelet counts are stable and unchanged.  No significant peripheral edema.  He is on room air, has some complaints of dyspnea but largely this appears to be almost a breath-holding maneuver.   Creatinine, Ser (mg/dL)  Date Value  09/20/2019 3.51 (H)  09/19/2019 2.93 (H)  09/18/2019 2.29 (H)  08/19/2019 2.28 (H)  08/18/2019 2.00 (H)  08/18/2019 2.12 (H)  08/18/2019 2.09 (H)  08/18/2019 2.06 (H)  11/29/2017 1.33 (H)  11/27/2017 1.43 (H)  ] I/Os: I/O last 3 completed shifts: In: 50  [P.O.:480] Out: 1875 [Urine:1875]   ROS NSAIDS: No exposure IV Contrast no exposure TMP/SMX no exposure Hypotension not present Balance of 12 systems is negative w/ exceptions as above  PMH  Past Medical History:  Diagnosis Date  . Arthritis    "elbows and wrists" (11/13/2017)  . CHF (congestive heart failure) ([place]) 08/2016  . CKD stage 3 due to type 2 diabetes mellitus ([place]) 06/2016  . Diabetes mellitus type 2 in obese ([place])   . High cholesterol   . Hypertension   . Hypothyroidism   . Left foot drop 04/12/2018  . Migraines   . Morbid obesity ([place])   . On home oxygen therapy    "3L; now only at night" (11/13/2017)  . OSA on CPAP   . Pneumonia 2017   "double pneumonia"  . Pneumonia 10/2017   "while in ICU" (11/13/2017)  . Pulmonary embolism ([place])   . Seizure ([place])    "10/24/2017-10/28/2017; couldn't get them stopped til medically induced coma. after they brought him out he started having more; can't find RX to stop them" (11/13/2017)  . Stroke [facility])    "scans showed several mini strokes in the past; nothing recent" (11/13/2017)   [place]  Past Surgical History:  Procedure Laterality Date  . CARDIAC CATHETERIZATION  12/2016   FH  Family History  Problem Relation Age of Onset  . Glaucoma Mother   . Diabetes Maternal Grandmother   . Diabetes Maternal Grandfather   . Alpha-1 antitrypsin deficiency Neg Hx   . Asthma Neg Hx   . Breast cancer Neg Hx   . Stroke Neg  Hx   . Colon cancer Neg Hx   . Coronary artery disease Neg Hx   . COPD Neg Hx   . Cystic fibrosis Neg Hx   . Deep vein thrombosis Neg Hx   . Hypertension Neg Hx   . Hyperlipidemia Neg Hx   . Kidney disease Neg Hx   . Liver disease Neg Hx   . Lung cancer Neg Hx   . Lupus Neg Hx   . Neurofibromatosis Neg Hx   . Osteoporosis Neg Hx   . Ovarian cancer Neg Hx   . Positive PPD/TB Exposure Neg Hx   . Prostate cancer Neg Hx   . Pulmonary embolism Neg Hx   . Pulmonary fibrosis Neg Hx   . Rheum arthritis Neg Hx   .  Sarcoidosis Neg Hx   . Sleep apnea Neg Hx   . Thyroid disease Neg Hx   . Tuberculosis Neg Hx   . Tuberous sclerosis Neg Hx    SH  reports that he has never smoked. He quit smokeless tobacco use about 6 years ago.  His smokeless tobacco use included chew. He reports previous alcohol use. He reports that he does not use drugs. Allergies  Allergies  Allergen Reactions  . Montelukast Other (See Comments)    Possibly cause headaches per spouse   Home medications Prior to Admission medications   Medication Sig Start Date End Date Taking? Authorizing Provider  acetaminophen (TYLENOL) 325 MG tablet Take 1-2 tablets (325-650 mg total) by mouth every 4 (four) hours as needed for mild pain. 11/29/17  Yes Love, Ivan Anchors, PA-C  albuterol (PROVENTIL HFA) 108 (90 Base) MCG/ACT inhaler Inhale 1 puff into the lungs every 6 (six) hours as needed for wheezing or shortness of breath.    Yes [provider]  aspirin EC 81 MG tablet Take 81 mg by mouth daily.    Yes [provider]  citalopram (CELEXA) 20 MG tablet Take 20 mg by mouth daily.   Yes [provider]  gabapentin (NEURONTIN) 100 MG capsule Take 1 capsule (100 mg total) by mouth 3 (three) times daily. 12/01/17  Yes Love, Ivan Anchors, PA-C  INSULIN ASPART PROT & ASPART [place] Inject 60 Units into the skin in the morning and at bedtime.   Yes [provider]  levothyroxine (SYNTHROID, LEVOTHROID) 50 MCG tablet Take 1 tablet (50 mcg total) by mouth daily. 12/01/17  Yes Love, Ivan Anchors, PA-C  lisinopril (ZESTRIL) 20 MG tablet Take 20 mg by mouth daily.   Yes [provider]  NOVOLOG FLEXPEN 100 UNIT/ML FlexPen Inject 2-14 Units into the skin 3 (three) times daily with meals. INJECT 3 TIMES DAILY WITH MEALS FOR GLUCOSE 121-150 USE 2 UNIT, 151-200 USE 3 UNIT, 201-250 USE 5 UNIT, 251-200 USE 7 UNIT, 301-350 10 UNIT, 351 & UP 14 UNITS 08/19/19  Yes [provider]  insulin detemir (LEVEMIR) 100 UNIT/ML injection  Inject 60 Units into the skin 2 (two) times daily.    [provider]  Insulin Pen Needle (CAREFINE PEN NEEDLES) 32G X 6 MM MISC 1 application by Does not apply route 2 (two) times daily. 12/01/17   Love, Ivan Anchors, PA-C    Current Medications Scheduled Meds: . aspirin  300 mg Rectal Daily   Or  . aspirin  325 mg Oral Daily  . atorvastatin  80 mg Oral QHS  . citalopram  20 mg Oral Daily  . clopidogrel  75 mg Oral Daily  . enoxaparin (LOVENOX) injection  60 mg Subcutaneous Q24H  . insulin aspart  0-20 Units Subcutaneous TID WC  . insulin aspart protamine- aspart  60 Units Subcutaneous BID WC  . levothyroxine  50 mcg Oral QAC breakfast  . metoprolol tartrate  12.5 mg Oral BID   Continuous Infusions: PRN Meds:.acetaminophen **OR** acetaminophen (TYLENOL) oral liquid 160 mg/5 mL **OR** acetaminophen  CBC Recent Labs  Lab 09/18/19 0620 09/18/19 0826 09/19/19 0928  WBC 8.4  --  12.3*  NEUTROABS  --   --  10.3*  HGB 17.2* 17.0 15.6  HCT 55.2* 50.0 48.9  MCV 86.5  --  84.7  PLT 283  --  811   Basic Metabolic Panel Recent Labs  Lab 09/18/19 0620 09/18/19 0826 09/19/19 0928 09/20/19 0300  NA 133* 132* 136 137  K 4.9 5.1 4.6 4.5  CL 99  --  103 101  CO2 25  --  23 26  GLUCOSE 350*  --  167* 121*  BUN 25*  --  37* 48*  CREATININE 2.29*  --  2.93* 3.51*  CALCIUM 8.2*  --  7.9* 8.1*    Physical Exam  Blood pressure (!) 114/63, pulse 80, temperature 98.3 F (36.8 C), temperature source Oral, resp. rate 20, weight (!) 137.4 kg, SpO2 95 %. GEN: Obese, NAD, in chair, conversant ENT: NCAT EYES: EOMI CV: Regular, normal S1 and S2 PULM: Clear bilaterally ABD: Soft, nontender SKIN: No rashes, lesions, petechia, purpura EXT: No peripheral edema  Assessment 48M with AoCKD3 in setting of AMS, acute small CVA, uncontrolled DM2 and HTN on ACEi.   1. AoCKD3, nonoliguric, proteinuria 1. Renal US during admit w/o obstruction 2. UA with 4+ protein, +hematuria/pyuria, neg  UCx: most likey this is DKD but can't exclude another GN 3. DKD/HTN explains baseline kidney disease 2. Acute small ischemic CVA per neuro 3. DM2 uncontrolled 4. HTN BP stable off ACEi, would not resuem at this time 5. AMS improving, neg w/u other than #2 6. HLD 7. Hypothyroid  Plan 1. As above, quantify proteinruia, check serologies (dobut GN) 2. Daily weights, Daily Renal Panel, Strict I/Os, Avoid nephrotoxins (NSAIDs, judicious IV Contrast)    Rexene Agent  [phone] pgr 09/20/2019, 1:25 PM

## 2019-09-20 NOTE — Progress Notes (Signed)
Carotid duplex bilateral study completed.   See Cv Proc for preliminary results.   Axel Frisk  

## 2019-09-20 NOTE — Progress Notes (Signed)
PROGRESS NOTE    Daniel [name]  YTK:[id number] DOB: 1965-03-15 DOA: 09/18/2019 PCP: Antonietta Jewel, MD   Brief Narrative:  Patient is a 54 year old male with past medical history of hypertension, hyperlipidemia, hypothyroidism, CVA, diabetes type 2, seizure, PE, morbid obesity, OSA on CPAP, CKD stage IIIb who presented with complaints of shortness of breath.  He was confused on presentation.  Altered mental status was observed for last 2 days before admission.  MRI done in the emergency department showed 6 mm acute infarct in the right temporoparietal white matter.  Neurology consulted.  Stroke work-up initiated.  Hospital course remarkable for acute respiratory failure with hypoxia, severe hypertension, AKI on CKD.  Nephrology consulted for worsening kidney function.  Assessment & Plan:   Principal Problem:   CVA (cerebral vascular accident) ([place]) Active Problems:   CKD (chronic kidney disease), stage III   Hyperlipidemia   OSA (obstructive sleep apnea)   Diabetes mellitus type 2 in obese (HCC)   Obesity, Class III, BMI 40-49.9 (morbid obesity) ([place])   Acute metabolic encephalopathy   Acute nonhemorrhagic CVA: Presented with confusion for last 2 days before admission.  MRI showed 6 mm acute infarct in the right temporoparietal white matter.Also showed mall chronic lacunar infarcts within the bilateral frontal lobe white matter, right basal ganglia and left cerebellum  MRA showed no large intracranial artery occlusion but showed high-grade stenosis or occlusion, basilar artery severe stenosis,moderate to marked stenosis of the clinoid and proximal supraclinoid right ICA.Mild to moderate stenosis of the right M1 MCA. Echo showed ejection fraction of 40 to 45%, no intracardiac source of emboli.  Pending carotid Doppler. Stroke work-up initiated.  PT/OT/speech evaluation done and recommended home health.  Started on aspirin, Plavix and statin.  Neurology recommends to continue aspirin Plavix for  3 weeks then aspirin alone. He has uncontrolled diabetes mellitus, hyperlipidemia which could have contributed to the CVA. LDL of 150. hemoglobin A1c of 13.3  Acute encephalopathy: Most likely secondary to underlying stroke.  UDS negative.  Monitor mental status.  Currently he is oriented to place and person but not time.  Acute respiratory failure with hypoxia: Chest x-ray showed interstitial edema.  Elevated BNP.  Echocardiogram showed ejection fraction of 40 to 45%.  Hypoxia could have been contributed by flash pulmonary edema from severe hypertension.  Given a dose of Lasix 40 mg IV on 09/19/19.  Monitor respiratory status.  Currently on room air.  Systolic heart failure: He looks mildly volume overloaded.  BNP was elevated.  Echocardiogram done here showed ejection fraction of 40 to 45%.  Cardiology consulted .  Started on low-dose beta-blockers.  If his blood pressure permits, then will start on BiDil.  He will follow-up with cardiology as an outpatient.  Uncontrolled diabetes mellitus type 2: His hemoglobin A1c is 13.3  .Continue current insulin regimen.  He takes insulin at home.  Diabetic coordinator  following.    Hyperlipidemia: LDL of 150.  Continue Lipitor  AKI on CKD stage IIIb: Creatinine noted to be 2.29 which appears to be similar to previous hospitalization last month.  Kidney function worsened today and creatinine is 3.5.  Urine sodium is 33.  We have requested consultation for nephrology.  Hypertension:  Continue as needed medications for severe hypertension.  Allow permissive hypertension  Hypothyroidism: Continue with thyroxine  Depression: Citalopram  OSA/morbid obesity: BMI of 42.4.  On CPAP at night       DVT prophylaxis: Lovenox Code Status:Full  Family Communication: called wife multiple times yesterday  and today,call not received Status is: Inpatient  Remains inpatient appropriate because:Hemodynamically unstable   Dispo: The patient is from: Home               Anticipated d/c is to: Home              Anticipated d/c date is: 2-3 days              Patient currently is not medically stable to d/c.     Consultants: Cardiology, neurology  Procedures: None  Antimicrobials:  Anti-infectives (From admission, onward)   None      Subjective: Patient seen and examined at the bedside this morning.  Hemodynamically stable today.  On room air.  Looked comfortable.Obeys commands ,communicates very well  but alert and oriented to place and person only  Objective: Vitals:   09/19/19 1944 09/19/19 2259 09/19/19 2308 09/20/19 0316  BP: 122/73  (!) 141/76 (!) 140/73  Pulse: 87  67 66  Resp: 18  18 18   Temp: 100.3 F (37.9 C) 97.7 F (36.5 C) 99 F (37.2 C) 98.6 F (37 C)  TempSrc: Oral Axillary Oral Oral  SpO2: 95%  99% 98%  Weight:    (!) 137.4 kg    Intake/Output Summary (Last 24 hours) at 09/20/2019 7408 Last data filed at 09/20/2019 0800 Gross per 24 hour  Intake 580 ml  Output 2250 ml  Net -1670 ml   Filed Weights   09/18/19 1900 09/19/19 0321 09/20/19 0316  Weight: (!) 134 kg (!) 134.3 kg (!) 137.4 kg    Examination:  General exam: Morbidly obese, not in distress HEENT:PERRL,Oral mucosa moist, Ear/Nose normal on gross exam Respiratory system: Bilateral equal air entry, normal vesicular breath sounds, no wheezes or crackles  Cardiovascular system: S1 & S2 heard, RRR. No JVD, murmurs, rubs, gallops or clicks. Gastrointestinal system: Abdomen is nondistended, soft and nontender. No organomegaly or masses felt. Normal bowel sounds heard. Central nervous system: Alert and oriented to place and person only. No focal neurological deficits. Extremities: No edema, no clubbing ,no cyanosis Skin: No rashes, lesions or ulcers,no icterus ,no pallor   Data Reviewed: I have personally reviewed following labs and imaging studies  CBC: Recent Labs  Lab 09/18/19 0620 09/18/19 0826 09/19/19 0928  WBC 8.4  --  12.3*  NEUTROABS  --    --  10.3*  HGB 17.2* 17.0 15.6  HCT 55.2* 50.0 48.9  MCV 86.5  --  84.7  PLT 283  --  144   Basic Metabolic Panel: Recent Labs  Lab 09/18/19 0620 09/18/19 0826 09/19/19 0928 09/20/19 0300  NA 133* 132* 136 137  K 4.9 5.1 4.6 4.5  CL 99  --  103 101  CO2 25  --  23 26  GLUCOSE 350*  --  167* 121*  BUN 25*  --  37* 48*  CREATININE 2.29*  --  2.93* 3.51*  CALCIUM 8.2*  --  7.9* 8.1*   GFR: Estimated Creatinine Clearance: 33.2 mL/min (A) (by C-G formula based on SCr of 3.51 mg/dL (H)). Liver Function Tests: Recent Labs  Lab 09/18/19 0739  AST 20  ALT 13  ALKPHOS 60  BILITOT 0.9  PROT 6.1*  ALBUMIN 2.4*   No results for input(s): LIPASE, AMYLASE in the last 168 hours. No results for input(s): AMMONIA in the last 168 hours. Coagulation Profile: No results for input(s): INR, PROTIME in the last 168 hours. Cardiac Enzymes: No results for input(s): CKTOTAL, CKMB, CKMBINDEX, TROPONINI in  the last 168 hours. BNP (last 3 results) No results for input(s): PROBNP in the last 8760 hours. HbA1C: Recent Labs    09/19/19 0928  HGBA1C 13.3*   CBG: Recent Labs  Lab 09/19/19 0907 09/19/19 1137 09/19/19 1600 09/19/19 2104 09/20/19 0559  GLUCAP 173* 175* 139* 103* 140*   Lipid Profile: Recent Labs    09/20/19 0300  CHOL 222*  HDL 24*  LDLCALC 150*  TRIG 240*  CHOLHDL 9.3   Thyroid Function Tests: No results for input(s): TSH, T4TOTAL, FREET4, T3FREE, THYROIDAB in the last 72 hours. Anemia Panel: No results for input(s): VITAMINB12, FOLATE, FERRITIN, TIBC, IRON, RETICCTPCT in the last 72 hours. Sepsis Labs: No results for input(s): PROCALCITON, LATICACIDVEN in the last 168 hours.  Recent Results (from the past 240 hour(s))  Culture, Urine     Status: None   Collection Time: 09/18/19  6:14 AM   Specimen: Urine, Random  Result Value Ref Range Status   Specimen Description URINE, RANDOM  Final   Special Requests NONE  Final   Culture   Final    NO  GROWTH Performed at [facility], [address], [place], [place] [address]    Report Status 09/19/2019 FINAL  Final  SARS Coronavirus 2 by RT PCR (hospital order, performed in [facility] hospital lab) Nasopharyngeal Nasopharyngeal Swab     Status: None   Collection Time: 09/18/19  8:50 AM   Specimen: Nasopharyngeal Swab  Result Value Ref Range Status   SARS Coronavirus 2 NEGATIVE NEGATIVE Final    Comment: (NOTE) SARS-CoV-2 target nucleic acids are NOT DETECTED.  The SARS-CoV-2 RNA is generally detectable in upper and lower respiratory specimens during the acute phase of infection. The lowest concentration of SARS-CoV-2 viral copies this assay can detect is 250 copies / mL. A negative result does not preclude SARS-CoV-2 infection and should not be used as the sole basis for treatment or other patient management decisions.  A negative result may occur with improper specimen collection / handling, submission of specimen other than nasopharyngeal swab, presence of viral mutation(s) within the areas targeted by this assay, and inadequate number of viral copies (<250 copies / mL). A negative result must be combined with clinical observations, patient history, and epidemiological information.  Fact Sheet for Patients:   StrictlyIdeas.no  Fact Sheet for Healthcare Providers: BankingDealers.co.za  This test is not yet approved or  cleared by the [place] FDA and has been authorized for detection and/or diagnosis of SARS-CoV-2 by FDA under an Emergency Use Authorization (EUA).  This EUA will remain in effect (meaning this test can be used) for the duration of the COVID-19 declaration under Section 564(b)(1) of the Act, 21 U.S.C. section 360bbb-3(b)(1), unless the authorization is terminated or revoked sooner.  Performed at [facility], [place] [address], [place], [place] [address]          Radiology Studies: DG  Skull 1-3 Views  Result Date: 09/18/2019 CLINICAL DATA:  Screening for MRI. EXAM: SKULL - 1-3 VIEW COMPARISON:  None. FINDINGS: No metallic foreign body. No fracture or bone lesion. Clear sinuses. Soft tissues are unremarkable. IMPRESSION: Negative.  No metal foreign body. Electronically Signed   By: Lajean Manes M.D.   On: 09/18/2019 11:33   DG Neck Soft Tissue  Result Date: 09/18/2019 CLINICAL DATA:  Screening for MRI. EXAM: NECK SOFT TISSUES - 1+ VIEW COMPARISON:  None. FINDINGS: No metallic foreign body. Normal soft tissues.  Airway is widely patent. Mild  disc degenerative changes at C5-C6 and C6-C7 with mild loss of disc height and endplate spurring. No skeletal abnormality. IMPRESSION: 1. No metallic foreign body.  No acute finding. Electronically Signed   By: Lajean Manes M.D.   On: 09/18/2019 11:35   DG Chest 1 View  Result Date: 09/19/2019 CLINICAL DATA:  Shortness of breath.  History of CHF. EXAM: CHEST  1 VIEW COMPARISON:  09/18/2019. FINDINGS: Cardiomegaly. Mild bilateral interstitial prominence. Findings consistent with CHF with bilateral interstitial edema. No pleural effusions or pneumothorax. IMPRESSION: Cardiomegaly with mild bilateral interstitial prominence consistent with interstitial edema. Pneumonitis cannot be excluded. Electronically Signed   By: Marcello Moores  Register   On: 09/19/2019 06:42   DG Pelvis 1-2 Views  Result Date: 09/18/2019 CLINICAL DATA:  Screening for MRI. EXAM: PELVIS - 1-2 VIEW COMPARISON:  12/09/2017 FINDINGS: No fracture or bone lesion. The SI joints, pubic symphysis and hip joints are normally spaced and aligned. Degenerative disc changes are noted of the lower lumbar spine. Soft tissues are unremarkable. No metallic foreign bodies. IMPRESSION: 1. No metallic foreign bodies. 2. No fracture, bone lesion or joint abnormality of the pelvis or hips. Electronically Signed   By: Lajean Manes M.D.   On: 09/18/2019 11:33   DG Abd 1 View  Result Date:  09/18/2019 CLINICAL DATA:  Screening for MRI. EXAM: ABDOMEN - 1 VIEW COMPARISON:  10/28/2017 FINDINGS: No metallic foreign body. Normal bowel gas pattern. No evidence of a soft tissue mass or organomegaly. No evidence of renal or ureteral stones. IMPRESSION: 1. No metallic foreign body. 2. No acute findings. Electronically Signed   By: Lajean Manes M.D.   On: 09/18/2019 11:35   MR ANGIO HEAD WO CONTRAST  Result Date: 09/18/2019 CLINICAL DATA:  Acute stroke on MRI brain EXAM: MRA HEAD WITHOUT CONTRAST TECHNIQUE: Angiographic images of the Circle of Willis were obtained using MRA technique without intravenous contrast. COMPARISON:  None. FINDINGS: Intracranial internal carotid arteries are patent. There is moderate to marked stenosis of the clinoid and proximal supraclinoid portions of the right ICA. Middle and anterior cerebral arteries are patent. Mild to moderate stenosis of the right M1 MCA. Intracranial vertebral arteries are not definitely identified. There appear to be patent posteroinferior cerebellar arteries. 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 Posterior cerebral arteries are patent. Bilateral posterior communicating arteries are present. There is no aneurysm. IMPRESSION: Intracranial vertebral arteries not definitely identified with high-grade stenosis or occlusion. 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 Posterior cerebral arteries are patent. There are large bilateral posterior communicating arteries. Moderate to marked stenosis of the clinoid and proximal supraclinoid right ICA. Mild to moderate stenosis of the right M1 MCA. Electronically Signed   By: Macy Mis M.D.   On: 09/18/2019 16:38   MR BRAIN WO CONTRAST  Result Date: 09/18/2019 CLINICAL DATA:  Provided history: Mental status change, unknown cause.  EXAM: MRI HEAD WITHOUT CONTRAST TECHNIQUE: Multiplanar, multiecho pulse sequences of the brain and surrounding structures were obtained without intravenous contrast. COMPARISON:  Head CT 08/18/2019, brain MRI 10/25/2017 FINDINGS: Brain: Mild generalized parenchymal atrophy. The examination is intermittently motion degraded, limiting evaluation. Most notably, there is moderate/severe motion degradation of the axial SWI sequence. 6 mm focus of restricted diffusion consistent with acute infarction within the right temporoparietal white matter, immediately posterolateral to the right thalamus (series 2, image 27). Mild T2  shine through at site of chronic lacunar infarcts within the frontal lobe white matter bilaterally (series 2, image 37). Small chronic lacunar infarcts within the cerebral white matter and right basal ganglia are new as compared to the prior MRI of 10/25/2017. Redemonstrated small chronic lacunar infarcts within the inferior left cerebellum. Background mild patchy T2/FLAIR hyperintensity within the cerebral white matter is nonspecific, but consistent with chronic small vessel ischemic disease. These findings have progressed as compared to the prior MRI. No evidence of intracranial mass. No definite chronic intracranial blood products are identified on the significantly motion degraded SWI sequence. No extra-axial fluid collection. No midline shift. Vascular: Expected proximal arterial flow voids. Skull and upper cervical spine: No focal marrow lesion. Sinuses/Orbits: Visualized orbits show no acute finding. Paranasal sinus disease. Most notably, there is moderate mucosal thickening within the ethmoid, sphenoid and left maxillary sinuses. Trace fluid within right mastoid air cells. IMPRESSION: Motion degraded examination as described. 6 mm acute infarct within the right temporoparietal white matter, immediately posterolateral to the right thalamus. Small chronic lacunar infarcts within the bilateral  frontal lobe white matter, right basal ganglia and left cerebellum. The frontal lobe white matter and right basal ganglia lacunar infarcts were not present on the prior MRI of 10/25/2017. Background mild generalized parenchymal atrophy and progressive mild chronic small vessel ischemic changes within the cerebral white matter. Pansinusitis, most notably ethmoid, sphenoid and left maxillary. Trace right mastoid effusion. Electronically Signed   By: Kellie Simmering DO   On: 09/18/2019 12:36   US RENAL  Result Date: 09/19/2019 CLINICAL DATA:  Acute renal injury EXAM: RENAL / URINARY TRACT ULTRASOUND COMPLETE COMPARISON:  11/07/2017 FINDINGS: Right Kidney: Renal measurements: 12.1 x 6.5 x 4.9 cm. = volume: 201 mL. Mild increased echogenicity is noted. No obstructive changes are seen. Left Kidney: Renal measurements: 13.1 x 7.0 x 4.5 cm. = volume: 215 mL. Mild increased echogenicity is noted. No obstructive changes are seen. Bladder: Partially distended Other: None. IMPRESSION: Increased echogenicity consistent with medical renal disease. No acute obstructive changes are seen. Electronically Signed   By: Inez Catalina M.D.   On: 09/19/2019 22:43   EEG adult  Result Date: 09/18/2019 Lora Havens, MD     09/18/2019  9:58 PM Patient Name: Daniel [name] MRN: [id number] Epilepsy Attending: Lora Havens Referring Physician/Provider: Dr Roland Rack Date: 09/18/2019 Duration: 20.46 mins Patient history: This is a 54 year old male presenting to the hospital for initial shortness of breath.  On the hospital wife noted that he has been confused. EEG to evaluate for seizure. Level of alertness: Awake/ lethargic AEDs during EEG study: None Technical aspects: This EEG study was done with scalp electrodes positioned according to the 10-20 International system of electrode placement. Electrical activity was acquired at a sampling rate of 500Hz  and reviewed with a high frequency filter of 70Hz  and a low frequency filter  of 1Hz . EEG data were recorded continuously and digitally stored. Description: The posterior dominant rhythm consists of 8 Hz activity of moderate voltage (25-35 uV) seen predominantly in posterior head regions, symmetric and reactive to eye opening and eye closing. EEG showed intermittent generalized rhythmic 3 to 6 Hz theta-delta slowing. Hyperventilation and photic stimulation were not performed.   ABNORMALITY -Intermittent rhythmic slow, generalized IMPRESSION: This study is suggestive of mild diffuse encephalopathy, nonspecific etiology. No seizures or epileptiform discharges were seen throughout the recording. Lora Havens   ECHOCARDIOGRAM COMPLETE  Result Date: 09/19/2019    ECHOCARDIOGRAM REPORT   Patient  Name:   Daniel [name] Date of Exam: 09/19/2019 Medical Rec #:  [id number]     Height:       70.0 in Accession #:    [id number]    Weight:       296.1 lb Date of Birth:  December 03, 1965     BSA:          2.466 m Patient Age:    52 years      BP:           175/102 mmHg Patient Gender: M             HR:           110 bpm. Exam Location:  Inpatient Procedure: 2D Echo and Intracardiac Opacification Agent Indications:    Stroke I163.9  History:        Patient has no prior history of Echocardiogram examinations.                 CHF; Risk Factors:Hypertension and Diabetes.  Sonographer:    Mikki Santee RDCS (AE) Referring Phys: [id number] [facility] A SMITH  Sonographer Comments: Technically difficult study due to poor echo windows, suboptimal parasternal window and suboptimal apical window. Image acquisition challenging due to patient body habitus. IMPRESSIONS  1. Technically difficult study, very limited views even after contrast administration. Left ventricle is poorly visualized, unable to rule out systolic dysfunction. Consider cardiac MRI to better evaluate LV function  2. Right ventricular systolic function is normal. The right ventricular size is normal.  3. The mitral valve is grossly normal. No evidence  of mitral valve regurgitation.  4. The aortic valve was not well visualized. Aortic valve regurgitation is not visualized. FINDINGS  Left Ventricle: Left ventricular ejection fraction, by estimation, is 40 to 45%. The left ventricle has mildly decreased function. Left ventricular endocardial border not optimally defined to evaluate regional wall motion. Definity contrast agent was given IV to delineate the left ventricular endocardial borders. The left ventricular internal cavity size was normal in size. There is mild left ventricular hypertrophy. Left ventricular diastolic parameters are indeterminate. Right Ventricle: The right ventricular size is normal. Right vetricular wall thickness was not assessed. Right ventricular systolic function is normal. Left Atrium: Left atrial size was not well visualized. Right Atrium: Right atrial size was not well visualized. Pericardium: Trivial pericardial effusion is present. Presence of pericardial fat pad. Mitral Valve: The mitral valve is grossly normal. No evidence of mitral valve regurgitation. Tricuspid Valve: The tricuspid valve is not well visualized. Tricuspid valve regurgitation is not demonstrated. Aortic Valve: The aortic valve was not well visualized. Aortic valve regurgitation is not visualized. Pulmonic Valve: The pulmonic valve was not well visualized. Pulmonic valve regurgitation is not visualized. Aorta: The aortic root is normal in size and structure. IAS/Shunts: The interatrial septum was not well visualized.  LEFT VENTRICLE PLAX 2D LVIDd:         5.10 cm  Diastology LVIDs:         3.40 cm  LV e' medial: 6.22 cm/s LV PW:         1.30 cm LV IVS:        1.30 cm LVOT diam:     2.50 cm LVOT Area:     4.91 cm  RIGHT VENTRICLE TAPSE (M-mode): 2.4 cm LEFT ATRIUM             Index       RIGHT ATRIUM  Index LA diam:        3.60 cm 1.46 cm/m  RA Area:     13.30 cm LA Vol (A2C):   46.6 ml 18.90 ml/m RA Volume:   31.00 ml  12.57 ml/m LA Vol (A4C):   37.3  ml 15.13 ml/m LA Biplane Vol: 43.9 ml 17.80 ml/m   AORTA Ao Root diam: 3.60 cm  SHUNTS Systemic Diam: 2.50 cm Oswaldo Milian MD Electronically signed by Oswaldo Milian MD Signature Date/Time: 09/19/2019/11:27:19 AM    Final         Scheduled Meds:  aspirin  300 mg Rectal Daily   Or   aspirin  325 mg Oral Daily   atorvastatin  80 mg Oral QHS   citalopram  20 mg Oral Daily   clopidogrel  75 mg Oral Daily   enoxaparin (LOVENOX) injection  60 mg Subcutaneous Q24H   insulin aspart  0-20 Units Subcutaneous TID WC   insulin aspart protamine- aspart  60 Units Subcutaneous BID WC   levothyroxine  50 mcg Oral QAC breakfast   metoprolol tartrate  12.5 mg Oral BID   Continuous Infusions:   LOS: 2 days    Time spent: 35 mins.More than 50% of that time was spent in counseling and/or coordination of care.      Shelly Coss, MD [facility] P7/30/2021, 8:21 AM

## 2019-09-20 NOTE — Progress Notes (Signed)
Physical Therapy Treatment Patient Details Name: Daniel [name] MRN: [id number] DOB: 11/06/1965 Today's Date: 09/20/2019    History of Present Illness Pt is 54 yo male with PMH of HTN, HLD, hypothyrooidism, CVA, DM2, seizure, morbid obesity, OSA, kidney disease stage III who presented to ED with [facility]. Per notes pt has had declining mental status over last 2 years in regards to memory loss, however, acutely worsened over past 2 days and became verbally abusive which is abnormal.  Pt was found to have acute infarct 6 mm in R temporoparietal white matter and acute metabolic encephalopathy. Pt had rapid response earlier this morning due to [facility], but has stabilized and cleared for PT/OT per RN.    PT Comments    Patient progressing well towards PT goals. Less impulsive today. Improved ambulation distance with Min guard- supervision for safety. Pt with significant memory deficits, not able to recall things <30 seconds-1 minute after being told. Pt oriented to self only and not able to recall where he is or why he is here, "something to do with my diabetes." Noted to have some SOB with activity, 2/4 DOE with mobility requiring standing rest break half way through gait. Poor awareness of safety/deficits. Recommend 24/7 supervision. Will follow.   Follow Up Recommendations  Home health PT;Supervision/Assistance - 24 hour     Equipment Recommendations  None recommended by PT    Recommendations for Other Services       Precautions / Restrictions Precautions Precautions: Fall Precaution Comments: impulsive Restrictions Weight Bearing Restrictions: No    Mobility  Bed Mobility Overal bed mobility: Needs Assistance Bed Mobility: Sit to Supine       Sit to supine: Modified independent (Device/Increase time)   General bed mobility comments: No assist needed, pt plopped back into bed.  Transfers Overall transfer level: Needs assistance Equipment used: None Transfers: Sit to/from Stand Sit  to Stand: Min guard         General transfer comment: Min guard for safety as pt fast to rise. "I really want to mess with ya'll, like lose my balance backwards and fall."  Ambulation/Gait Ambulation/Gait assistance: Supervision Gait Distance (Feet): 150 Feet Assistive device: None Gait Pattern/deviations: Wide base of support;Step-through pattern;Drifts right/left Gait velocity: normal   General Gait Details: Mild impulsivity; drifting noted but no overt LOB. 1 standing rest break leaning against the wall. 2/4 DOE. Reports weakness in feet.   Stairs             Wheelchair Mobility    Modified Rankin (Stroke Patients Only) Modified Rankin (Stroke Patients Only) Pre-Morbid Rankin Score: No significant disability Modified Rankin: Moderate disability     Balance Overall balance assessment: Needs assistance Sitting-balance support: Feet supported;No upper extremity supported Sitting balance-Leahy Scale: Fair Sitting balance - Comments: able to adjust socks without difficulty.   Standing balance support: During functional activity Standing balance-Leahy Scale: Fair Standing balance comment: Some mild instability noted but no overt LOB. Min guard-supervision for safety.                            Cognition Arousal/Alertness: Awake/alert Behavior During Therapy: Impulsive Overall Cognitive Status: No family/caregiver present to determine baseline cognitive functioning Area of Impairment: Memory;Orientation;Following commands;Safety/judgement;Awareness                 Orientation Level: Disoriented to;Place;Time;Situation   Memory: Decreased short-term memory Following Commands: Follows one step commands consistently Safety/Judgement: Decreased awareness of safety;Decreased awareness of  deficits Awareness: Intellectual Problem Solving: Difficulty sequencing;Requires verbal cues General Comments: Slightly impulsive but improved from prior session;  inappropriate with comments at times but able to be redirected. Pt with <30 second recall rate. Not able to state where he was and why he was in the hospital despite discussion 1 minute before. Cannot state where he works.      Exercises      General Comments        Pertinent Vitals/Pain Pain Assessment: No/denies pain    Home Living                      Prior Function            PT Goals (current goals can now be found in the care plan section) Progress towards PT goals: Progressing toward goals    Frequency    Min 4X/week      PT Plan Current plan remains appropriate    Co-evaluation              AM-PAC PT "6 Clicks" Mobility   Outcome Measure  Help needed turning from your back to your side while in a flat bed without using bedrails?: None Help needed moving from lying on your back to sitting on the side of a flat bed without using bedrails?: None Help needed moving to and from a bed to a chair (including a wheelchair)?: A Little Help needed standing up from a chair using your arms (e.g., wheelchair or bedside chair)?: A Little Help needed to walk in hospital room?: A Little Help needed climbing 3-5 steps with a railing? : A Little 6 Click Score: 20    End of Session Equipment Utilized During Treatment: Gait belt Activity Tolerance: Patient tolerated treatment well Patient left: in bed;with call bell/phone within reach;with bed alarm set Nurse Communication: Mobility status PT Visit Diagnosis: Unsteadiness on feet (R26.81)     Time: 6644-0347 PT Time Calculation (min) (ACUTE ONLY): 18 min  Charges:  $Therapeutic Activity: 8-22 mins                     Marisa Severin, PT, DPT Acute Rehabilitation Services Pager [phone] Office [phone]       Marguarite Arbour A Sabra Heck 09/20/2019, 3:19 PM

## 2019-09-20 NOTE — Progress Notes (Signed)
Subjective:  Patient denies any chest pain or shortness of breath.  Up in chair.  Objective:  Vital Signs in the last 24 hours: Temp:  [97.7 F (36.5 C)-100.3 F (37.9 C)] 98.3 F (36.8 C) (07/30 1141) Pulse Rate:  [66-99] 80 (07/30 1141) Resp:  [18-20] 20 (07/30 1141) BP: (102-141)/(63-76) 114/63 (07/30 1141) SpO2:  [93 %-99 %] 95 % (07/30 1141) Weight:  [137.4 kg] 137.4 kg (07/30 0316)  Intake/Output from previous day: 07/29 0701 - 07/30 0700 In: 480 [P.O.:480] Out: 1875 [Urine:1875] Intake/Output from this shift: Total I/O In: 340 [P.O.:340] Out: 1050 [Urine:1050]  Physical Exam: Neck: no adenopathy, no carotid bruit, no JVD and supple, symmetrical, trachea midline Lungs: clear to auscultation bilaterally Heart: regular rate and rhythm and S1, S2 normal Abdomen: soft, non-tender; bowel sounds normal; no masses,  no organomegaly Extremities: extremities normal, atraumatic, no cyanosis or edema  Lab Results: Recent Labs    09/18/19 0620 09/18/19 0620 09/18/19 0826 09/19/19 0928  WBC 8.4  --   --  12.3*  HGB 17.2*   < > 17.0 15.6  PLT 283  --   --  227   < > = values in this interval not displayed.   Recent Labs    09/19/19 0928 09/20/19 0300  NA 136 137  K 4.6 4.5  CL 103 101  CO2 23 26  GLUCOSE 167* 121*  BUN 37* 48*  CREATININE 2.93* 3.51*   No results for input(s): TROPONINI in the last 72 hours.  Invalid input(s): CK, MB Hepatic Function Panel Recent Labs    09/18/19 0739  PROT 6.1*  ALBUMIN 2.4*  AST 20  ALT 13  ALKPHOS 60  BILITOT 0.9  BILIDIR 0.2  IBILI 0.7   Recent Labs    09/20/19 0300  CHOL 222*   No results for input(s): PROTIME in the last 72 hours.  Imaging: Imaging results have been reviewed and DG Chest 1 View  Result Date: 09/19/2019 CLINICAL DATA:  Shortness of breath.  History of CHF. EXAM: CHEST  1 VIEW COMPARISON:  09/18/2019. FINDINGS: Cardiomegaly. Mild bilateral interstitial prominence. Findings consistent with  CHF with bilateral interstitial edema. No pleural effusions or pneumothorax. IMPRESSION: Cardiomegaly with mild bilateral interstitial prominence consistent with interstitial edema. Pneumonitis cannot be excluded. Electronically Signed   By: Marcello Moores  Register   On: 09/19/2019 06:42   MR ANGIO HEAD WO CONTRAST  Result Date: 09/18/2019 CLINICAL DATA:  Acute stroke on MRI brain EXAM: MRA HEAD WITHOUT CONTRAST TECHNIQUE: Angiographic images of the Circle of Willis were obtained using MRA technique without intravenous contrast. COMPARISON:  None. FINDINGS: Intracranial internal carotid arteries are patent. There is moderate to marked stenosis of the clinoid and proximal supraclinoid portions of the right ICA. Middle and anterior cerebral arteries are patent. Mild to moderate stenosis of the right M1 MCA. Intracranial vertebral arteries are not definitely identified. There appear to be patent posteroinferior cerebellar arteries. 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 Posterior cerebral arteries are patent. Bilateral posterior communicating arteries are present. There is no aneurysm. IMPRESSION: Intracranial vertebral arteries not definitely identified with high-grade stenosis or occlusion. 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 Posterior cerebral arteries are patent. There are large bilateral posterior communicating arteries. Moderate to marked stenosis of the clinoid and proximal supraclinoid right ICA. Mild to moderate stenosis of the right M1 MCA.  Electronically Signed   By: Macy Mis M.D.   On: 09/18/2019 16:38   MR BRAIN WO CONTRAST  Result Date: 09/18/2019 CLINICAL DATA:  Provided history: Mental status change, unknown cause. EXAM: MRI HEAD WITHOUT CONTRAST TECHNIQUE: Multiplanar, multiecho pulse sequences of the brain and  surrounding structures were obtained without intravenous contrast. COMPARISON:  Head CT 08/18/2019, brain MRI 10/25/2017 FINDINGS: Brain: Mild generalized parenchymal atrophy. The examination is intermittently motion degraded, limiting evaluation. Most notably, there is moderate/severe motion degradation of the axial SWI sequence. 6 mm focus of restricted diffusion consistent with acute infarction within the right temporoparietal white matter, immediately posterolateral to the right thalamus (series 2, image 27). Mild T2 shine through at site of chronic lacunar infarcts within the frontal lobe white matter bilaterally (series 2, image 37). Small chronic lacunar infarcts within the cerebral white matter and right basal ganglia are new as compared to the prior MRI of 10/25/2017. Redemonstrated small chronic lacunar infarcts within the inferior left cerebellum. Background mild patchy T2/FLAIR hyperintensity within the cerebral white matter is nonspecific, but consistent with chronic small vessel ischemic disease. These findings have progressed as compared to the prior MRI. No evidence of intracranial mass. No definite chronic intracranial blood products are identified on the significantly motion degraded SWI sequence. No extra-axial fluid collection. No midline shift. Vascular: Expected proximal arterial flow voids. Skull and upper cervical spine: No focal marrow lesion. Sinuses/Orbits: Visualized orbits show no acute finding. Paranasal sinus disease. Most notably, there is moderate mucosal thickening within the ethmoid, sphenoid and left maxillary sinuses. Trace fluid within right mastoid air cells. IMPRESSION: Motion degraded examination as described. 6 mm acute infarct within the right temporoparietal white matter, immediately posterolateral to the right thalamus. Small chronic lacunar infarcts within the bilateral frontal lobe white matter, right basal ganglia and left cerebellum. The frontal lobe white matter and  right basal ganglia lacunar infarcts were not present on the prior MRI of 10/25/2017. Background mild generalized parenchymal atrophy and progressive mild chronic small vessel ischemic changes within the cerebral white matter. Pansinusitis, most notably ethmoid, sphenoid and left maxillary. Trace right mastoid effusion. Electronically Signed   By: Kellie Simmering DO   On: 09/18/2019 12:36   US RENAL  Result Date: 09/19/2019 CLINICAL DATA:  Acute renal injury EXAM: RENAL / URINARY TRACT ULTRASOUND COMPLETE COMPARISON:  11/07/2017 FINDINGS: Right Kidney: Renal measurements: 12.1 x 6.5 x 4.9 cm. = volume: 201 mL. Mild increased echogenicity is noted. No obstructive changes are seen. Left Kidney: Renal measurements: 13.1 x 7.0 x 4.5 cm. = volume: 215 mL. Mild increased echogenicity is noted. No obstructive changes are seen. Bladder: Partially distended Other: None. IMPRESSION: Increased echogenicity consistent with medical renal disease. No acute obstructive changes are seen. Electronically Signed   By: Inez Catalina M.D.   On: 09/19/2019 22:43   EEG adult  Result Date: 09/18/2019 Lora Havens, MD     09/18/2019  9:58 PM Patient Name: Daniel [name] MRN: [id number] Epilepsy Attending: Lora Havens Referring Physician/Provider: Dr Roland Rack Date: 09/18/2019 Duration: 20.46 mins Patient history: This is a 54 year old male presenting to the hospital for initial shortness of breath.  On the hospital wife noted that he has been confused. EEG to evaluate for seizure. Level of alertness: Awake/ lethargic AEDs during EEG study: None Technical aspects: This EEG study was done with scalp electrodes positioned according to the 10-20 International system of electrode placement. Electrical activity was acquired at a sampling rate of 500Hz  and reviewed  with a high frequency filter of 70Hz  and a low frequency filter of 1Hz . EEG data were recorded continuously and digitally stored. Description: The posterior dominant  rhythm consists of 8 Hz activity of moderate voltage (25-35 uV) seen predominantly in posterior head regions, symmetric and reactive to eye opening and eye closing. EEG showed intermittent generalized rhythmic 3 to 6 Hz theta-delta slowing. Hyperventilation and photic stimulation were not performed.   ABNORMALITY -Intermittent rhythmic slow, generalized IMPRESSION: This study is suggestive of mild diffuse encephalopathy, nonspecific etiology. No seizures or epileptiform discharges were seen throughout the recording. Lora Havens   ECHOCARDIOGRAM COMPLETE  Result Date: 09/19/2019    ECHOCARDIOGRAM REPORT   Patient Name:   Daniel [name] Date of Exam: 09/19/2019 Medical Rec #:  [id number]     Height:       70.0 in Accession #:    [id number]    Weight:       296.1 lb Date of Birth:  1965/11/26     BSA:          2.466 m Patient Age:    39 years      BP:           175/102 mmHg Patient Gender: M             HR:           110 bpm. Exam Location:  Inpatient Procedure: 2D Echo and Intracardiac Opacification Agent Indications:    Stroke I163.9  History:        Patient has no prior history of Echocardiogram examinations.                 CHF; Risk Factors:Hypertension and Diabetes.  Sonographer:    Mikki Santee RDCS (AE) Referring Phys: [id number] [facility]  Sonographer Comments: Technically difficult study due to poor echo windows, suboptimal parasternal window and suboptimal apical window. Image acquisition challenging due to patient body habitus. IMPRESSIONS  1. Technically difficult study, very limited views even after contrast administration. Left ventricle is poorly visualized, unable to rule out systolic dysfunction. Consider cardiac MRI to better evaluate LV function  2. Right ventricular systolic function is normal. The right ventricular size is normal.  3. The mitral valve is grossly normal. No evidence of mitral valve regurgitation.  4. The aortic valve was not well visualized. Aortic valve regurgitation  is not visualized. FINDINGS  Left Ventricle: Left ventricular ejection fraction, by estimation, is 40 to 45%. The left ventricle has mildly decreased function. Left ventricular endocardial border not optimally defined to evaluate regional wall motion. Definity contrast agent was given IV to delineate the left ventricular endocardial borders. The left ventricular internal cavity size was normal in size. There is mild left ventricular hypertrophy. Left ventricular diastolic parameters are indeterminate. Right Ventricle: The right ventricular size is normal. Right vetricular wall thickness was not assessed. Right ventricular systolic function is normal. Left Atrium: Left atrial size was not well visualized. Right Atrium: Right atrial size was not well visualized. Pericardium: Trivial pericardial effusion is present. Presence of pericardial fat pad. Mitral Valve: The mitral valve is grossly normal. No evidence of mitral valve regurgitation. Tricuspid Valve: The tricuspid valve is not well visualized. Tricuspid valve regurgitation is not demonstrated. Aortic Valve: The aortic valve was not well visualized. Aortic valve regurgitation is not visualized. Pulmonic Valve: The pulmonic valve was not well visualized. Pulmonic valve regurgitation is not visualized. Aorta: The aortic root is normal in size and structure. IAS/Shunts: The interatrial  septum was not well visualized.  LEFT VENTRICLE PLAX 2D LVIDd:         5.10 cm  Diastology LVIDs:         3.40 cm  LV e' medial: 6.22 cm/s LV PW:         1.30 cm LV IVS:        1.30 cm LVOT diam:     2.50 cm LVOT Area:     4.91 cm  RIGHT VENTRICLE TAPSE (M-mode): 2.4 cm LEFT ATRIUM             Index       RIGHT ATRIUM           Index LA diam:        3.60 cm 1.46 cm/m  RA Area:     13.30 cm LA Vol (A2C):   46.6 ml 18.90 ml/m RA Volume:   31.00 ml  12.57 ml/m LA Vol (A4C):   37.3 ml 15.13 ml/m LA Biplane Vol: 43.9 ml 17.80 ml/m   AORTA Ao Root diam: 3.60 cm  SHUNTS Systemic Diam:  2.50 cm Oswaldo Milian MD Electronically signed by Oswaldo Milian MD Signature Date/Time: 09/19/2019/11:27:19 AM    Final     Cardiac Studies:  Assessment/Plan:  Compensated systolic congestive heart failure Abnormal EKG cannot rule out old anterolateral wall MI age undetermined stable as compared to prior ECGs Hypertension Uncontrolled diabetes mellitus Punctate right temporoparietal infarct secondary to small vessel disease doubt cardioembolic Chronic kidney disease stage III Morbid obesity History of pulmonary embolism History of seizure disorder Obstructive sleep apnea on CPAP Hypothyroidism Depression Plan Continue present management No further cardiac work-up at this point Okay to discharge from cardiac point of view Follow-up with me in 1 to 2 weeks We will add BiDil as outpatient as blood pressure tolerates  LOS: 2 days    Charolette Forward 09/20/2019, 12:15 PM

## 2019-09-21 LAB — BASIC METABOLIC PANEL
Anion gap: 12 (ref 5–15)
BUN: 44 mg/dL — ABNORMAL HIGH (ref 6–20)
CO2: 23 mmol/L (ref 22–32)
Calcium: 7.9 mg/dL — ABNORMAL LOW (ref 8.9–10.3)
Chloride: 102 mmol/L (ref 98–111)
Creatinine, Ser: 2.74 mg/dL — ABNORMAL HIGH (ref 0.61–1.24)
GFR calc Af Amer: 29 mL/min — ABNORMAL LOW (ref >60)
GFR calc non Af Amer: 25 mL/min — ABNORMAL LOW (ref >60)
Glucose, Bld: 169 mg/dL — ABNORMAL HIGH (ref 70–99)
Potassium: 5.1 mmol/L (ref 3.5–5.1)
Sodium: 137 mmol/L (ref 135–145)

## 2019-09-21 LAB — ANA W/REFLEX IF POSITIVE: Anti Nuclear Antibody (ANA): NEGATIVE

## 2019-09-21 LAB — HCV AB W REFLEX TO QUANT PCR: HCV Ab: <0.1 s/co ratio (ref 0.0–0.9)

## 2019-09-21 LAB — GLUCOSE, CAPILLARY
Glucose-Capillary: 159 mg/dL — ABNORMAL HIGH (ref 70–99)
Glucose-Capillary: 200 mg/dL — ABNORMAL HIGH (ref 70–99)

## 2019-09-21 LAB — C4 COMPLEMENT: Complement C4, Body Fluid: 38 mg/dL (ref 12–38)

## 2019-09-21 LAB — HCV INTERPRETATION

## 2019-09-21 LAB — ANTISTREPTOLYSIN O TITER: ASO: 32 IU/mL (ref 0.0–200.0)

## 2019-09-21 LAB — C3 COMPLEMENT: C3 Complement: 163 mg/dL (ref 82–167)

## 2019-09-21 MED ORDER — EZETIMIBE 10 MG PO TABS: 10.0000 mg | ORAL_TABLET | Freq: Every day | ORAL | 1 refills | Status: AC

## 2019-09-21 MED ORDER — HALOPERIDOL LACTATE 5 MG/ML IJ SOLN
2.5000 mg | Freq: Four times a day (QID) | INTRAMUSCULAR | Status: DC | PRN
  Administered 2019-09-21: 2.5 mg via INTRAVENOUS
  Filled 2019-09-21: qty 1

## 2019-09-21 MED ORDER — METOPROLOL TARTRATE 25 MG PO TABS: 25.0000 mg | ORAL_TABLET | Freq: Two times a day (BID) | ORAL | 1 refills | Status: AC

## 2019-09-21 MED ORDER — CLOPIDOGREL BISULFATE 75 MG PO TABS: 75.0000 mg | ORAL_TABLET | Freq: Every day | ORAL | 1 refills | Status: AC

## 2019-09-21 MED ORDER — ASPIRIN 325 MG PO TABS: 325.0000 mg | ORAL_TABLET | Freq: Every day | ORAL | 0 refills | Status: AC

## 2019-09-21 MED ORDER — ATORVASTATIN CALCIUM 80 MG PO TABS: 80.0000 mg | ORAL_TABLET | Freq: Every day | ORAL | 1 refills | Status: AC

## 2019-09-21 NOTE — Progress Notes (Signed)
RT attempted to place patient on BIPAP HS. Patient stated he did not want to wear it tonight. RT attempted twice.

## 2019-09-21 NOTE — Progress Notes (Signed)
Admit: 09/18/2019 LOS: 3  78M with AoCKD3 in setting of AMS, acute small CVA, uncontrolled DM2 and HTN on ACEi.   Subjective:  . Creatinine improved overnight, excellent UOP . No complaints this morning  07/30 0701 - 07/31 0700 In: 580 [P.O.:580] Out: 2700 [Urine:2700]  Filed Weights   09/19/19 0321 09/20/19 0316 09/21/19 0308  Weight: (!) 134.3 kg (!) 137.4 kg (!) 137.5 kg    Scheduled Meds: . aspirin  300 mg Rectal Daily   Or  . aspirin  325 mg Oral Daily  . atorvastatin  80 mg Oral QHS  . citalopram  20 mg Oral Daily  . clopidogrel  75 mg Oral Daily  . enoxaparin (LOVENOX) injection  60 mg Subcutaneous Q24H  . ezetimibe  10 mg Oral Daily  . insulin aspart  0-20 Units Subcutaneous TID WC  . insulin aspart protamine- aspart  60 Units Subcutaneous BID WC  . levothyroxine  50 mcg Oral QAC breakfast  . metoprolol tartrate  12.5 mg Oral BID   Continuous Infusions: PRN Meds:.acetaminophen **OR** acetaminophen (TYLENOL) oral liquid 160 mg/5 mL **OR** acetaminophen, haloperidol lactate  Current Labs: reviewed  Neg: HBV, HCV, HIV, C3 and C4 WNL, ASO Pending: SPEP, ANCA panel. ANA Anti GBM UP/C = 6  Physical Exam:  Blood pressure (!) 153/72, pulse 79, temperature 97.8 F (36.6 C), temperature source Oral, resp. rate 18, weight (!) 137.5 kg, SpO2 92 %. GEN: Obese, NAD, in bed, conversant ENT: NCAT EYES: EOMI CV: Regular, normal S1 and S2 PULM: Clear bilaterally, normal work of breathing  ABD: Soft, nontender SKIN: No rashes, lesions, petechia, purpura EXT: No peripheral edema  A 1. AoCKD3, nonoliguric, nephrotic proteinuria 1. Renal US during admit w/o obstruction 2. UA with 4+ protein, +hematuria/pyuria, neg UCx: most likey this is DKD but can't exclude another GN; serologies are all negative or pending, as above 3. DKD/HTN explains baseline kidney disease 4. Significant improvement in past 24 hours after holding ACE inhibitor 2. Acute small ischemic CVA per  neuro 3. DM2 uncontrolled 4. HTN BP stable off ACEi, would not resumeat this time 5. AMS improving, neg w/u other than #2 6. HLD 7. Hypothyroid  P . Continue supportive care, holding ACE inhibitor . Follow-up on remaining serologies, this can be done as an outpatient . Will need to follow[facility] at [facility] upon discharge' . Daily weights, Daily Renal Panel, Strict I/Os, Avoid nephrotoxins (NSAIDs, judicious IV Contrast)   Pearson Grippe MD 09/21/2019, 10:42 AM  Recent Labs  Lab 09/19/19 0928 09/20/19 0300 09/21/19 0253  NA 136 137 137  K 4.6 4.5 5.1  CL 103 101 102  CO2 23 26 23   GLUCOSE 167* 121* 169*  BUN 37* 48* 44*  CREATININE 2.93* 3.51* 2.74*  CALCIUM 7.9* 8.1* 7.9*   Recent Labs  Lab 09/18/19 0620 09/18/19 0826 09/19/19 0928  WBC 8.4  --  12.3*  NEUTROABS  --   --  10.3*  HGB 17.2* 17.0 15.6  HCT 55.2* 50.0 48.9  MCV 86.5  --  84.7  PLT 283  --  227

## 2019-09-21 NOTE — Care Management (Signed)
Notified [facility]

## 2019-09-21 NOTE — Progress Notes (Signed)
Physical Therapy Treatment Patient Details Name: Daniel [name] MRN: [id number] DOB: 10-06-1965 Today's Date: 09/21/2019    History of Present Illness Pt is 54 yo male with PMH of HTN, HLD, hypothyrooidism, CVA, DM2, seizure, morbid obesity, OSA, kidney disease stage III who presented to ED with [facility]. Per notes pt has had declining mental status over last 2 years in regards to memory loss, however, acutely worsened over past 2 days and became verbally abusive which is abnormal.  Pt was found to have acute infarct 6 mm in R temporoparietal white matter and acute metabolic encephalopathy. Pt had rapid response earlier this morning due to [facility], but has stabilized and cleared for PT/OT per RN.    PT Comments    Pt supine in bed on arrival.  Pt awaiting to leave.  Focused on gt and progression to stair training before d/c.  He remains impulsive with continued cues for education for safety.  Pt to d/c home today with HHPT.   Follow Up Recommendations  Home health PT;Supervision/Assistance - 24 hour     Equipment Recommendations  None recommended by PT    Recommendations for Other Services       Precautions / Restrictions Precautions Precautions: Fall Precaution Comments: impulsive Restrictions Weight Bearing Restrictions: No    Mobility  Bed Mobility Overal bed mobility: Needs Assistance Bed Mobility: Supine to Sit;Sit to Supine     Supine to sit: Supervision Sit to supine: Supervision   General bed mobility comments: Cues for sequencing and hand placement.  Transfers Overall transfer level: Needs assistance Equipment used: None Transfers: Sit to/from Stand Sit to Stand: Supervision         General transfer comment: Pt continues to make jokes about falling, but no assistance needed to rise into standing.  Ambulation/Gait Ambulation/Gait assistance: Supervision Gait Distance (Feet): 150 Feet Assistive device: None Gait Pattern/deviations: Wide base of  support;Step-through pattern;Drifts right/left     General Gait Details: Mild impulsivity; drifting noted but no overt LOB. Continues to fatigue.   Stairs Stairs: Yes Stairs assistance: Supervision Stair Management: Two rails Number of Stairs: 10 General stair comments: x6 4 inch and x 4 6 in.  Pt with no LOB, mild issue with foot clearance on L descending 6 inch step.  Remains impulsive with cues to slow down to ensure safety.   Wheelchair Mobility    Modified Rankin (Stroke Patients Only) Modified Rankin (Stroke Patients Only) Pre-Morbid Rankin Score: No significant disability Modified Rankin: Moderate disability     Balance Overall balance assessment: Needs assistance   Sitting balance-Leahy Scale: Fair       Standing balance-Leahy Scale: Fair                              Cognition Arousal/Alertness: Awake/alert Behavior During Therapy: Impulsive Overall Cognitive Status: No family/caregiver present to determine baseline cognitive functioning Area of Impairment: Memory;Orientation;Following commands;Safety/judgement;Awareness                 Orientation Level: Disoriented to;Place;Time;Situation   Memory: Decreased short-term memory Following Commands: Follows one step commands consistently Safety/Judgement: Decreased awareness of safety;Decreased awareness of deficits Awareness: Intellectual Problem Solving: Difficulty sequencing;Requires verbal cues General Comments: Slightly impulsive but improved from prior session; inappropriate with comments at times but able to be redirected. Pt with <30 second recall rate. Not able to state where he was and why he was in the hospital despite discussion 1 minute before. Cannot state where  he works.      Exercises      General Comments        Pertinent Vitals/Pain Pain Assessment: No/denies pain Faces Pain Scale: Hurts a little bit Pain Location: Reports one episode of back spasm that  resolved Pain Descriptors / Indicators: Grimacing;Discomfort;Spasm Pain Intervention(s): Monitored during session;Repositioned    Home Living                      Prior Function            PT Goals (current goals can now be found in the care plan section) Acute Rehab PT Goals Patient Stated Goal: go home Potential to Achieve Goals: Fair Progress towards PT goals: Progressing toward goals    Frequency    Min 4X/week      PT Plan Current plan remains appropriate    Co-evaluation              AM-PAC PT "6 Clicks" Mobility   Outcome Measure  Help needed turning from your back to your side while in a flat bed without using bedrails?: None Help needed moving from lying on your back to sitting on the side of a flat bed without using bedrails?: None Help needed moving to and from a bed to a chair (including a wheelchair)?: A Little Help needed standing up from a chair using your arms (e.g., wheelchair or bedside chair)?: A Little Help needed to walk in hospital room?: A Little Help needed climbing 3-5 steps with a railing? : A Little 6 Click Score: 20    End of Session Equipment Utilized During Treatment: Gait belt Activity Tolerance: Patient tolerated treatment well Patient left: in bed;with call bell/phone within reach;with bed alarm set Nurse Communication: Mobility status PT Visit Diagnosis: Unsteadiness on feet (R26.81)     Time: 4401-0272 PT Time Calculation (min) (ACUTE ONLY): 9 min  Charges:  $Gait Training: 8-22 mins                     Daniel [name] , PTA Acute Rehabilitation Services Pager [phone] Office [phone]     Daniel [name] 09/21/2019, 2:21 PM

## 2019-09-21 NOTE — Progress Notes (Addendum)
Pt discharges. PIV removed, CCMD called, AVS given will patient education performed. All questions addressed. Pt transported to Emergency Room entrance by wheelchair.

## 2019-09-21 NOTE — Discharge Summary (Signed)
Physician Discharge Summary  Daniel [name] DOB: Aug 02, 1965 DOA: 09/18/2019  PCP: Antonietta Jewel, MD  Admit date: 09/18/2019 Discharge date: 09/21/2019  Admitted From: Home Disposition:  Home  Discharge Condition:Stable CODE STATUS:FULL Diet recommendation: Heart Healthy   Brief/Interim Summary:  Patient is a 54 year old male with past medical history of hypertension, hyperlipidemia, hypothyroidism, CVA, diabetes type 2, seizure, PE, morbid obesity, OSA on CPAP, CKD stage IIIb who presented with complaints of shortness of breath.  He was confused on presentation.  Altered mental status was observed for last 2 days before admission.  MRI done in the emergency department showed 6 mm acute infarct in the right temporoparietal white matter.  Neurology consulted.  Stroke work-up initiated and completed.  Hospital course remarkable for acute respiratory failure with hypoxia, severe hypertension, AKI on CKD, confusion.  Nephrology consulted for worsening kidney function.  His overall status has improved though he is still confused to time.  PT/OT recommend home health.  He is hemodynamically stable for discharge to home today.  Following problems were addressed during his hospitalization:  Acute nonhemorrhagic CVA: Presented with confusion for last 2 days before admission.  MRI showed 6 mm acute infarct in the right temporoparietal white matter.Also showed mall chronic lacunar infarcts within the bilateral frontal lobe white matter, right basal ganglia and left cerebellum  MRA showed no large intracranial artery occlusion but showed high-grade stenosis or occlusion, basilar artery severe stenosis,moderate to marked stenosis of the clinoid and proximal supraclinoid right ICA.Mild to moderate stenosis of the right M1 MCA. Echo showed ejection fraction of 40 to 45%, no intracardiac source of emboli.  Pending carotid Doppler. Stroke work-up initiated.  PT/OT/speech evaluation done and recommended  home health.  Started on aspirin, Plavix and statin.  Neurology recommends to continue aspirin Plavix for 3 weeks then aspirin alone. He has uncontrolled diabetes mellitus, hyperlipidemia which could have contributed to the CVA. LDL of 150. hemoglobin A1c of 13.3  Acute encephalopathy: Most likely secondary to underlying stroke.  UDS negative. Currently he is oriented to place and person but not time.  Follow-up with neurology as an outpatient.  Acute respiratory failure with hypoxia: Chest x-ray showed interstitial edema.  Elevated BNP.  Echocardiogram showed ejection fraction of 40 to 45%.  Hypoxia could have been contributed by flash pulmonary edema from severe hypertension.  Given a dose of Lasix 40 mg IV on 09/19/19.    Currently on room air.  Systolic heart failure: He looks mildly volume overloaded.  BNP was elevated.  Echocardiogram done here showed ejection fraction of 40 to 45%.  Cardiology consulted .  Started on low-dose beta-blockers.  He will follow-up with cardiology as an outpatient.  Uncontrolled diabetes mellitus type 2: His hemoglobin A1c is 13.3  .Continue current insulin regimen.  Diabetic coordinator  was following.    Hyperlipidemia: LDL of 150.  Continue Lipitor  AKI on CKD stage IIIb: Creatinine noted to be 2.29 which appears to be similar to previous hospitalization last month.  Kidney function worsened with  creatinine is 3.5.  Urine sodium is 33.    Creatinine has improved today.He will follow up with nephrology as an outpatient  Hypertension:  BP stable. Continue current medications  Hypothyroidism: Continue with thyroxine  Depression: Citalopram  OSA/morbid obesity: BMI of 42.4.  On CPAP at night    Discharge Diagnoses:  Principal Problem:   CVA (cerebral vascular accident) ([place]) Active Problems:   CKD (chronic kidney disease), stage III   Hyperlipidemia   OSA (  obstructive sleep apnea)   Diabetes mellitus type 2 in obese (HCC)   Obesity, Class  III, BMI 40-49.9 (morbid obesity) ([place])   Acute metabolic encephalopathy    Discharge Instructions  Discharge Instructions    Ambulatory referral to Neurology   Complete by: As directed    Follow up with Dr. Jannifer Franklin at [facility] in 4 weeks. Thanks.   Diet - low sodium heart healthy   Complete by: As directed    Discharge instructions   Complete by: As directed    1)Please take prescribed medications as instructed. 2)Follow up with your PCP in a week.  Do a BMP test during the follow-up 3)Please follow-up with nephrology as an outpatient. You will be called for appointment. 4)Follow up with cardiology as an outpatient.  Make an appointment in 4 weeks.  Name and number the provider has been attached. 5)Follow up with [facility] neurology as an outpatient in 4 weeks.  Name and number the provider has been attached.  Call for appointment   Increase activity slowly   Complete by: As directed      Allergies as of 09/21/2019      Reactions   Montelukast Other (See Comments)   Possibly cause headaches per spouse      Medication List    STOP taking these medications   aspirin EC 81 MG tablet Replaced by: aspirin 325 MG tablet   lisinopril 20 MG tablet Commonly known as: ZESTRIL     TAKE these medications   acetaminophen 325 MG tablet Commonly known as: TYLENOL Take 1-2 tablets (325-650 mg total) by mouth every 4 (four) hours as needed for mild pain.   aspirin 325 MG tablet Take 1 tablet (325 mg total) by mouth daily. Stop taking after 19 days Start taking on: September 22, 2019 Replaces: aspirin EC 81 MG tablet   atorvastatin 80 MG tablet Commonly known as: LIPITOR Take 1 tablet (80 mg total) by mouth at bedtime.   citalopram 20 MG tablet Commonly known as: CELEXA Take 20 mg by mouth daily.   clopidogrel 75 MG tablet Commonly known as: PLAVIX Take 1 tablet (75 mg total) by mouth daily. Start taking on: September 22, 2019   ezetimibe 10 MG tablet Commonly known as: ZETIA Take 1  tablet (10 mg total) by mouth daily. Start taking on: September 22, 2019   gabapentin 100 MG capsule Commonly known as: NEURONTIN Take 1 capsule (100 mg total) by mouth 3 (three) times daily.   INSULIN ASPART PROT & ASPART Rose Inject 60 Units into the skin in the morning and at bedtime.   insulin detemir 100 UNIT/ML injection Commonly known as: LEVEMIR Inject 60 Units into the skin 2 (two) times daily.   Insulin Pen Needle 32G X 6 MM Misc Commonly known as: CareFine Pen Needles 1 application by Does not apply route 2 (two) times daily.   levothyroxine 50 MCG tablet Commonly known as: SYNTHROID Take 1 tablet (50 mcg total) by mouth daily.   metoprolol tartrate 25 MG tablet Commonly known as: LOPRESSOR Take 1 tablet (25 mg total) by mouth 2 (two) times daily.   NovoLOG FlexPen 100 UNIT/ML FlexPen Generic drug: insulin aspart Inject 2-14 Units into the skin 3 (three) times daily with meals. INJECT 3 TIMES DAILY WITH MEALS FOR GLUCOSE 121-150 USE 2 UNIT, 151-200 USE 3 UNIT, 201-250 USE 5 UNIT, 251-200 USE 7 UNIT, 301-350 10 UNIT, 351 & UP 14 UNITS   Proventil HFA 108 (90 Base) MCG/ACT inhaler Generic drug:  albuterol Inhale 1 puff into the lungs every 6 (six) hours as needed for wheezing or shortness of breath.       Follow-up Information    Kathrynn Ducking, MD. Schedule an appointment as soon as possible for a visit in 4 week(s).   Specialty: Neurology Contact information: [address] [place] [address] [phone]        Rexene Agent, MD Follow up.   Specialty: Nephrology Why: We will call with appt details Contact information: [place] [address] [phone]        Charolette Forward, MD. Schedule an appointment as soon as possible for a visit in 4 week(s).   Specialty: Cardiology Contact information: [place] [address] [phone]        Antonietta Jewel, MD. Schedule an appointment as  soon as possible for a visit in 1 week(s).   Specialty: Internal Medicine Contact information: [address] [place] [address] [phone]              Allergies  Allergen Reactions  . Montelukast Other (See Comments)    Possibly cause headaches per spouse    Consultations:  Neurology, cardiology, nephrology   Procedures/Studies: DG Skull 1-3 Views  Result Date: 09/18/2019 CLINICAL DATA:  Screening for MRI. EXAM: SKULL - 1-3 VIEW COMPARISON:  None. FINDINGS: No metallic foreign body. No fracture or bone lesion. Clear sinuses. Soft tissues are unremarkable. IMPRESSION: Negative.  No metal foreign body. Electronically Signed   By: Lajean Manes M.D.   On: 09/18/2019 11:33   DG Neck Soft Tissue  Result Date: 09/18/2019 CLINICAL DATA:  Screening for MRI. EXAM: NECK SOFT TISSUES - 1+ VIEW COMPARISON:  None. FINDINGS: No metallic foreign body. Normal soft tissues.  Airway is widely patent. Mild disc degenerative changes at C5-C6 and C6-C7 with mild loss of disc height and endplate spurring. No skeletal abnormality. IMPRESSION: 1. No metallic foreign body.  No acute finding. Electronically Signed   By: Lajean Manes M.D.   On: 09/18/2019 11:35   DG Chest 1 View  Result Date: 09/19/2019 CLINICAL DATA:  Shortness of breath.  History of CHF. EXAM: CHEST  1 VIEW COMPARISON:  09/18/2019. FINDINGS: Cardiomegaly. Mild bilateral interstitial prominence. Findings consistent with CHF with bilateral interstitial edema. No pleural effusions or pneumothorax. IMPRESSION: Cardiomegaly with mild bilateral interstitial prominence consistent with interstitial edema. Pneumonitis cannot be excluded. Electronically Signed   By: Marcello Moores  Register   On: 09/19/2019 06:42   DG Chest 2 View  Result Date: 09/18/2019 CLINICAL DATA:  Syncope EXAM: CHEST - 2 VIEW COMPARISON:  August 18, 2019 FINDINGS: Lungs are clear. Heart is upper normal in size with pulmonary vascularity normal. No evident adenopathy.  There is degenerative change in the lower thoracic and upper lumbar regions. IMPRESSION: Lungs clear. Heart upper normal in size. No adenopathy appreciable. Electronically Signed   By: Lowella Grip III M.D.   On: 09/18/2019 08:21   DG Pelvis 1-2 Views  Result Date: 09/18/2019 CLINICAL DATA:  Screening for MRI. EXAM: PELVIS - 1-2 VIEW COMPARISON:  12/09/2017 FINDINGS: No fracture or bone lesion. The SI joints, pubic symphysis and hip joints are normally spaced and aligned. Degenerative disc changes are noted of the lower lumbar spine. Soft tissues are unremarkable. No metallic foreign bodies. IMPRESSION: 1. No metallic foreign bodies. 2. No fracture, bone lesion or joint abnormality of the pelvis or hips. Electronically Signed   By: Shanon Brow  Ormond M.D.   On: 09/18/2019 11:33   DG Abd 1 View  Result Date: 09/18/2019 CLINICAL DATA:  Screening for MRI. EXAM: ABDOMEN - 1 VIEW COMPARISON:  10/28/2017 FINDINGS: No metallic foreign body. Normal bowel gas pattern. No evidence of a soft tissue mass or organomegaly. No evidence of renal or ureteral stones. IMPRESSION: 1. No metallic foreign body. 2. No acute findings. Electronically Signed   By: Lajean Manes M.D.   On: 09/18/2019 11:35   MR ANGIO HEAD WO CONTRAST  Result Date: 09/18/2019 CLINICAL DATA:  Acute stroke on MRI brain EXAM: MRA HEAD WITHOUT CONTRAST TECHNIQUE: Angiographic images of the Circle of Willis were obtained using MRA technique without intravenous contrast. COMPARISON:  None. FINDINGS: Intracranial internal carotid arteries are patent. There is moderate to marked stenosis of the clinoid and proximal supraclinoid portions of the right ICA. Middle and anterior cerebral arteries are patent. Mild to moderate stenosis of the right M1 MCA. Intracranial vertebral arteries are not definitely identified. There appear to be patent posteroinferior cerebellar arteries. Basilar artery is severely stenotic or occluded with reconstitution at the level of  patent superior cerebellar artery origins. Posterior cerebral arteries are patent. Bilateral posterior communicating arteries are present. There is no aneurysm. IMPRESSION: Intracranial vertebral arteries not definitely identified with high-grade stenosis or occlusion. 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 Posterior cerebral arteries are patent. There are large bilateral posterior communicating arteries. Moderate to marked stenosis of the clinoid and proximal supraclinoid right ICA. Mild to moderate stenosis of the right M1 MCA. Electronically Signed   By: Macy Mis M.D.   On: 09/18/2019 16:38   MR BRAIN WO CONTRAST  Result Date: 09/18/2019 CLINICAL DATA:  Provided history: Mental status change, unknown cause. EXAM: MRI HEAD WITHOUT CONTRAST TECHNIQUE: Multiplanar, multiecho pulse sequences of the brain and surrounding structures were obtained without intravenous contrast. COMPARISON:  Head CT 08/18/2019, brain MRI 10/25/2017 FINDINGS: Brain: Mild generalized parenchymal atrophy. The examination is intermittently motion degraded, limiting evaluation. Most notably, there is moderate/severe motion degradation of the axial SWI sequence. 6 mm focus of restricted diffusion consistent with acute infarction within the right temporoparietal white matter, immediately posterolateral to the right thalamus (series 2, image 27). Mild T2 shine through at site of chronic lacunar infarcts within the frontal lobe white matter bilaterally (series 2, image 37). Small chronic lacunar infarcts within the cerebral white matter and right basal ganglia are new as compared to the prior MRI of 10/25/2017. Redemonstrated small chronic lacunar infarcts within the inferior left cerebellum. Background mild patchy T2/FLAIR hyperintensity within the cerebral white matter is nonspecific, but consistent with chronic small vessel ischemic disease.  These findings have progressed as compared to the prior MRI. No evidence of intracranial mass. No definite chronic intracranial blood products are identified on the significantly motion degraded SWI sequence. No extra-axial fluid collection. No midline shift. Vascular: Expected proximal arterial flow voids. Skull and upper cervical spine: No focal marrow lesion. Sinuses/Orbits: Visualized orbits show no acute finding. Paranasal sinus disease. Most notably, there is moderate mucosal thickening within the ethmoid, sphenoid and left maxillary sinuses. Trace fluid within right mastoid air cells. IMPRESSION: Motion degraded examination as described. 6 mm acute infarct within the right temporoparietal white matter, immediately posterolateral to the right thalamus. Small chronic lacunar infarcts within the bilateral frontal lobe white matter, right basal ganglia and left cerebellum. The frontal lobe white matter and right basal ganglia lacunar  infarcts were not present on the prior MRI of 10/25/2017. Background mild generalized parenchymal atrophy and progressive mild chronic small vessel ischemic changes within the cerebral white matter. Pansinusitis, most notably ethmoid, sphenoid and left maxillary. Trace right mastoid effusion. Electronically Signed   By: Kellie Simmering DO   On: 09/18/2019 12:36   US RENAL  Result Date: 09/19/2019 CLINICAL DATA:  Acute renal injury EXAM: RENAL / URINARY TRACT ULTRASOUND COMPLETE COMPARISON:  11/07/2017 FINDINGS: Right Kidney: Renal measurements: 12.1 x 6.5 x 4.9 cm. = volume: 201 mL. Mild increased echogenicity is noted. No obstructive changes are seen. Left Kidney: Renal measurements: 13.1 x 7.0 x 4.5 cm. = volume: 215 mL. Mild increased echogenicity is noted. No obstructive changes are seen. Bladder: Partially distended Other: None. IMPRESSION: Increased echogenicity consistent with medical renal disease. No acute obstructive changes are seen. Electronically Signed   By: Inez Catalina M.D.   On: 09/19/2019 22:43   EEG adult  Result Date: 09/18/2019 Lora Havens, MD     09/18/2019  9:58 PM Patient Name: BENTON TOOKER MRN: [id number] Epilepsy Attending: Lora Havens Referring Physician/Provider: Dr Roland Rack Date: 09/18/2019 Duration: 20.46 mins Patient history: This is a 54 year old male presenting to the hospital for initial shortness of breath.  On the hospital wife noted that he has been confused. EEG to evaluate for seizure. Level of alertness: Awake/ lethargic AEDs during EEG study: None Technical aspects: This EEG study was done with scalp electrodes positioned according to the 10-20 International system of electrode placement. Electrical activity was acquired at a sampling rate of 500Hz  and reviewed with a high frequency filter of 70Hz  and a low frequency filter of 1Hz . EEG data were recorded continuously and digitally stored. Description: The posterior dominant rhythm consists of 8 Hz activity of moderate voltage (25-35 uV) seen predominantly in posterior head regions, symmetric and reactive to eye opening and eye closing. EEG showed intermittent generalized rhythmic 3 to 6 Hz theta-delta slowing. Hyperventilation and photic stimulation were not performed.   ABNORMALITY -Intermittent rhythmic slow, generalized IMPRESSION: This study is suggestive of mild diffuse encephalopathy, nonspecific etiology. No seizures or epileptiform discharges were seen throughout the recording. Lora Havens   ECHOCARDIOGRAM COMPLETE  Result Date: 09/19/2019    ECHOCARDIOGRAM REPORT   Patient Name:   TRESTON COKER Date of Exam: 09/19/2019 Medical Rec #:  [id number]     Height:       70.0 in Accession #:    [id number]    Weight:       296.1 lb Date of Birth:  [birth year]     BSA:          2.466 m Patient Age:    42 years      BP:           175/102 mmHg Patient Gender: M             HR:           110 bpm. Exam Location:  Inpatient Procedure: 2D Echo and Intracardiac Opacification  Agent Indications:    Stroke I163.9  History:        Patient has no prior history of Echocardiogram examinations.                 CHF; Risk Factors:Hypertension and Diabetes.  Sonographer:    Mikki Santee RDCS (AE) Referring Phys: [id number] [facility] A SMITH  Sonographer Comments: Technically difficult study due to poor echo windows, suboptimal parasternal window and  suboptimal apical window. Image acquisition challenging due to patient body habitus. IMPRESSIONS  1. Technically difficult study, very limited views even after contrast administration. Left ventricle is poorly visualized, unable to rule out systolic dysfunction. Consider cardiac MRI to better evaluate LV function  2. Right ventricular systolic function is normal. The right ventricular size is normal.  3. The mitral valve is grossly normal. No evidence of mitral valve regurgitation.  4. The aortic valve was not well visualized. Aortic valve regurgitation is not visualized. FINDINGS  Left Ventricle: Left ventricular ejection fraction, by estimation, is 40 to 45%. The left ventricle has mildly decreased function. Left ventricular endocardial border not optimally defined to evaluate regional wall motion. Definity contrast agent was given IV to delineate the left ventricular endocardial borders. The left ventricular internal cavity size was normal in size. There is mild left ventricular hypertrophy. Left ventricular diastolic parameters are indeterminate. Right Ventricle: The right ventricular size is normal. Right vetricular wall thickness was not assessed. Right ventricular systolic function is normal. Left Atrium: Left atrial size was not well visualized. Right Atrium: Right atrial size was not well visualized. Pericardium: Trivial pericardial effusion is present. Presence of pericardial fat pad. Mitral Valve: The mitral valve is grossly normal. No evidence of mitral valve regurgitation. Tricuspid Valve: The tricuspid valve is not well visualized.  Tricuspid valve regurgitation is not demonstrated. Aortic Valve: The aortic valve was not well visualized. Aortic valve regurgitation is not visualized. Pulmonic Valve: The pulmonic valve was not well visualized. Pulmonic valve regurgitation is not visualized. Aorta: The aortic root is normal in size and structure. IAS/Shunts: The interatrial septum was not well visualized.  LEFT VENTRICLE PLAX 2D LVIDd:         5.10 cm  Diastology LVIDs:         3.40 cm  LV e' medial: 6.22 cm/s LV PW:         1.30 cm LV IVS:        1.30 cm LVOT diam:     2.50 cm LVOT Area:     4.91 cm  RIGHT VENTRICLE TAPSE (M-mode): 2.4 cm LEFT ATRIUM             Index       RIGHT ATRIUM           Index LA diam:        3.60 cm 1.46 cm/m  RA Area:     13.30 cm LA Vol (A2C):   46.6 ml 18.90 ml/m RA Volume:   31.00 ml  12.57 ml/m LA Vol (A4C):   37.3 ml 15.13 ml/m LA Biplane Vol: 43.9 ml 17.80 ml/m   AORTA Ao Root diam: 3.60 cm  SHUNTS Systemic Diam: 2.50 cm Oswaldo Milian MD Electronically signed by Oswaldo Milian MD Signature Date/Time: 09/19/2019/11:27:19 AM    Final    VAS US CAROTID (at [facility] and [facility] only)  Result Date: 09/21/2019 Carotid Arterial Duplex Study Indications:       CVA. Limitations        Today's exam was limited due to Altered mental status-                    patient unable to cooperate during portions of the                    examination/remain still. Comparison Study:  No prior studies. Performing Technologist: Darlin Coco  Examination Guidelines: A complete evaluation includes B-mode imaging, spectral Doppler, color Doppler, and  power Doppler as needed of all accessible portions of each vessel. Bilateral testing is considered an integral part of a complete examination. Limited examinations for reoccurring indications may be performed as noted.  Right Carotid Findings: +----------+--------+--------+--------+------------------+------------------+           PSV cm/sEDV cm/sStenosisPlaque  DescriptionComments           +----------+--------+--------+--------+------------------+------------------+ CCA Prox  112     22                                intimal thickening +----------+--------+--------+--------+------------------+------------------+ CCA Distal85      15                                intimal thickening +----------+--------+--------+--------+------------------+------------------+ ICA Prox  63      21      1-39%                                        +----------+--------+--------+--------+------------------+------------------+ ICA Distal61      21                                                   +----------+--------+--------+--------+------------------+------------------+ ECA       155     15                                                   +----------+--------+--------+--------+------------------+------------------+ +----------+--------+-------+----------------+-------------------+           PSV cm/sEDV cmsDescribe        Arm Pressure (mmHG) +----------+--------+-------+----------------+-------------------+ Subclavian130            Multiphasic, WNL                    +----------+--------+-------+----------------+-------------------+ +---------+--------+--------+--------------+ VertebralPSV cm/sEDV cm/sNot identified +---------+--------+--------+--------------+  Left Carotid Findings: +----------+--------+--------+--------+------------------+------------------+           PSV cm/sEDV cm/sStenosisPlaque DescriptionComments           +----------+--------+--------+--------+------------------+------------------+ CCA Prox  106     24                                intimal thickening +----------+--------+--------+--------+------------------+------------------+ CCA Distal106     26                                intimal thickening +----------+--------+--------+--------+------------------+------------------+ ICA Prox  78       18      1-39%                                        +----------+--------+--------+--------+------------------+------------------+ ICA Distal94      32                                                   +----------+--------+--------+--------+------------------+------------------+  ECA       173     23                                                   +----------+--------+--------+--------+------------------+------------------+ +----------+--------+--------+----------------+-------------------+           PSV cm/sEDV cm/sDescribe        Arm Pressure (mmHG) +----------+--------+--------+----------------+-------------------+ [id number]             Multiphasic, WNL                    +----------+--------+--------+----------------+-------------------+ +---------+--------+--+--------+--+---------+ VertebralPSV cm/s85EDV cm/s31Antegrade +---------+--------+--+--------+--+---------+   Summary: Right Carotid: Velocities in the right ICA are consistent with a 1-39% stenosis. Left Carotid: Velocities in the left ICA are consistent with a 1-39% stenosis. Vertebrals:  Left vertebral artery demonstrates antegrade flow. Subclavians: Normal flow hemodynamics were seen in bilateral subclavian              arteries. *See table(s) above for measurements and observations.  Electronically signed by Deitra Mayo MD on 09/21/2019 at 4:18:38 AM.    Final        Subjective: Patient seen and examined at the bedside this morning.  Hemodynamically stable.  Comfortable stable for discharge.  Discharge Exam: Vitals:   09/21/19 0308 09/21/19 0718  BP: (!) 133/79 (!) 153/72  Pulse: 88 79  Resp: 18 18  Temp: 98.3 F (36.8 C) 97.8 F (36.6 C)  SpO2: 98% 92%   Vitals:   09/20/19 1915 09/20/19 2309 09/21/19 0308 09/21/19 0718  BP: (!) 141/66 (!) 138/85 (!) 133/79 (!) 153/72  Pulse: 79 81 88 79  Resp: 18 18 18 18   Temp: 98.1 F (36.7 C) 98 F (36.7 C) 98.3 F (36.8 C) 97.8 F (36.6  C)  TempSrc: Oral Oral Oral Oral  SpO2: 94% 97% 98% 92%  Weight:   (!) 137.5 kg     General: Pt is alert, awake, not in acute distress Cardiovascular: RRR, S1/S2 +, no rubs, no gallops Respiratory: CTA bilaterally, no wheezing, no rhonchi Abdominal: Soft, NT, ND, bowel sounds + Extremities: no edema, no cyanosis    The results of significant diagnostics from this hospitalization (including imaging, microbiology, ancillary and laboratory) are listed below for reference.     Microbiology: Recent Results (from the past 240 hour(s))  Culture, Urine     Status: None   Collection Time: 09/18/19  6:14 AM   Specimen: Urine, Random  Result Value Ref Range Status   Specimen Description URINE, RANDOM  Final   Special Requests NONE  Final   Culture   Final    NO GROWTH Performed at [facility], [address] [place], [place] [address]    Report Status 09/19/2019 FINAL  Final  SARS Coronavirus 2 by RT PCR (hospital order, performed in [facility] hospital lab) Nasopharyngeal Nasopharyngeal Swab     Status: None   Collection Time: 09/18/19  8:50 AM   Specimen: Nasopharyngeal Swab  Result Value Ref Range Status   SARS Coronavirus 2 NEGATIVE NEGATIVE Final    Comment: (NOTE) SARS-CoV-2 target nucleic acids are NOT DETECTED.  The SARS-CoV-2 RNA is generally detectable in upper and lower respiratory specimens during the acute phase of infection. The lowest concentration of SARS-CoV-2 viral copies this assay can detect is 250 copies / mL. A negative result does  not preclude SARS-CoV-2 infection and should not be used as the sole basis for treatment or other patient management decisions.  A negative result may occur with improper specimen collection / handling, submission of specimen other than nasopharyngeal swab, presence of viral mutation(s) within the areas targeted by this assay, and inadequate number of viral copies (<250 copies / mL). A negative result must be combined  with clinical observations, patient history, and epidemiological information.  Fact Sheet for Patients:   StrictlyIdeas.no  Fact Sheet for Healthcare Providers: BankingDealers.co.za  This test is not yet approved or  cleared by the [place] FDA and has been authorized for detection and/or diagnosis of SARS-CoV-2 by FDA under an Emergency Use Authorization (EUA).  This EUA will remain in effect (meaning this test can be used) for the duration of the COVID-19 declaration under Section 564(b)(1) of the Act, 21 U.S.C. section 360bbb-3(b)(1), unless the authorization is terminated or revoked sooner.  Performed at [facility], [place] [address] [place], [place] [address]      Labs: BNP (last 3 results) Recent Labs    09/18/19 0804  BNP 169.6*   Basic Metabolic Panel: Recent Labs  Lab 09/18/19 0620 09/18/19 0826 09/19/19 0928 09/20/19 0300 09/21/19 0253  NA 133* 132* 136 137 137  K 4.9 5.1 4.6 4.5 5.1  CL 99  --  103 101 102  CO2 25  --  23 26 23   GLUCOSE 350*  --  167* 121* 169*  BUN 25*  --  37* 48* 44*  CREATININE 2.29*  --  2.93* 3.51* 2.74*  CALCIUM 8.2*  --  7.9* 8.1* 7.9*   Liver Function Tests: Recent Labs  Lab 09/18/19 0739  AST 20  ALT 13  ALKPHOS 60  BILITOT 0.9  PROT 6.1*  ALBUMIN 2.4*   No results for input(s): LIPASE, AMYLASE in the last 168 hours. No results for input(s): AMMONIA in the last 168 hours. CBC: Recent Labs  Lab 09/18/19 0620 09/18/19 0826 09/19/19 0928  WBC 8.4  --  12.3*  NEUTROABS  --   --  10.3*  HGB 17.2* 17.0 15.6  HCT 55.2* 50.0 48.9  MCV 86.5  --  84.7  PLT 283  --  227   Cardiac Enzymes: No results for input(s): CKTOTAL, CKMB, CKMBINDEX, TROPONINI in the last 168 hours. BNP: Invalid input(s): POCBNP CBG: Recent Labs  Lab 09/20/19 1142 09/20/19 1637 09/20/19 2105 09/21/19 0602 09/21/19 1122  GLUCAP 128* 126* 130* 159* 200*   D-Dimer No results for  input(s): DDIMER in the last 72 hours. Hgb A1c Recent Labs    09/19/19 0928  HGBA1C 13.3*   Lipid Profile Recent Labs    09/20/19 0300  CHOL 222*  HDL 24*  LDLCALC 150*  TRIG 240*  CHOLHDL 9.3   Thyroid function studies No results for input(s): TSH, T4TOTAL, T3FREE, THYROIDAB in the last 72 hours.  Invalid input(s): FREET3 Anemia work up No results for input(s): VITAMINB12, FOLATE, FERRITIN, TIBC, IRON, RETICCTPCT in the last 72 hours. Urinalysis    Component Value Date/Time   COLORURINE YELLOW 09/18/2019 0614   APPEARANCEUR HAZY (A) 09/18/2019 0614   LABSPEC 1.021 09/18/2019 0614   PHURINE 5.0 09/18/2019 0614   GLUCOSEU >=500 (A) 09/18/2019 0614   HGBUR SMALL (A) 09/18/2019 0614   BILIRUBINUR NEGATIVE 09/18/2019 0614   KETONESUR NEGATIVE 09/18/2019 0614   PROTEINUR >=300 (A) 09/18/2019 0614   NITRITE NEGATIVE 09/18/2019 0614   LEUKOCYTESUR NEGATIVE 09/18/2019 0614   Sepsis  Labs Invalid input(s): PROCALCITONIN,  WBC,  LACTICIDVEN Microbiology Recent Results (from the past 240 hour(s))  Culture, Urine     Status: None   Collection Time: 09/18/19  6:14 AM   Specimen: Urine, Random  Result Value Ref Range Status   Specimen Description URINE, RANDOM  Final   Special Requests NONE  Final   Culture   Final    NO GROWTH Performed at [facility], [address], [place], [place] [address]    Report Status 09/19/2019 FINAL  Final  SARS Coronavirus 2 by RT PCR (hospital order, performed in [facility] hospital lab) Nasopharyngeal Nasopharyngeal Swab     Status: None   Collection Time: 09/18/19  8:50 AM   Specimen: Nasopharyngeal Swab  Result Value Ref Range Status   SARS Coronavirus 2 NEGATIVE NEGATIVE Final    Comment: (NOTE) SARS-CoV-2 target nucleic acids are NOT DETECTED.  The SARS-CoV-2 RNA is generally detectable in upper and lower respiratory specimens during the acute phase of infection. The lowest concentration of SARS-CoV-2 viral copies this assay  can detect is 250 copies / mL. A negative result does not preclude SARS-CoV-2 infection and should not be used as the sole basis for treatment or other patient management decisions.  A negative result may occur with improper specimen collection / handling, submission of specimen other than nasopharyngeal swab, presence of viral mutation(s) within the areas targeted by this assay, and inadequate number of viral copies (<250 copies / mL). A negative result must be combined with clinical observations, patient history, and epidemiological information.  Fact Sheet for Patients:   StrictlyIdeas.no  Fact Sheet for Healthcare Providers: BankingDealers.co.za  This test is not yet approved or  cleared by the [place] FDA and has been authorized for detection and/or diagnosis of SARS-CoV-2 by FDA under an Emergency Use Authorization (EUA).  This EUA will remain in effect (meaning this test can be used) for the duration of the COVID-19 declaration under Section 564(b)(1) of the Act, 21 U.S.C. section 360bbb-3(b)(1), unless the authorization is terminated or revoked sooner.  Performed at [facility], [place] [address], [place], [place] [address]     Please note: You were cared for by a hospitalist during your hospital stay. Once you are discharged, your primary care physician will handle any further medical issues. Please note that NO REFILLS for any discharge medications will be authorized once you are discharged, as it is imperative that you return to your primary care physician (or establish a relationship with a primary care physician if you do not have one) for your post hospital discharge needs so that they can reassess your need for medications and monitor your lab values.    Time coordinating discharge: 40 minutes  SIGNED:   Shelly Coss, MD  [facility] 09/21/2019, 11:24 AM Pager [phone]  If 7PM-7AM, please  contact night-coverage [email or web address] Password [email or web address]

## 2019-09-22 LAB — MPO/PR-3 (ANCA) ANTIBODIES
ANCA Proteinase 3: 6.9 U/mL — ABNORMAL HIGH (ref 0.0–3.5)
Myeloperoxidase Abs: <9 U/mL (ref 0.0–9.0)

## 2019-09-23 ENCOUNTER — Emergency Department (HOSPITAL_COMMUNITY): Payer: Medicare Other

## 2019-09-23 ENCOUNTER — Encounter (HOSPITAL_COMMUNITY): Payer: Self-pay | Admitting: Emergency Medicine

## 2019-09-23 ENCOUNTER — Other Ambulatory Visit: Payer: Self-pay

## 2019-09-23 ENCOUNTER — Emergency Department (HOSPITAL_COMMUNITY)
Admission: EM | Admit: 2019-09-23 | Discharge: 2019-09-23 | Disposition: A | Discharge status: Home / Self Care | Payer: Medicare Other | Attending: Emergency Medicine | Admitting: Emergency Medicine

## 2019-09-23 DIAGNOSIS — R0601 Orthopnea: Secondary | ICD-10-CM | POA: Diagnosis present

## 2019-09-23 DIAGNOSIS — N183 Chronic kidney disease, stage 3 unspecified: Secondary | ICD-10-CM | POA: Insufficient documentation

## 2019-09-23 DIAGNOSIS — E039 Hypothyroidism, unspecified: Secondary | ICD-10-CM | POA: Diagnosis not present

## 2019-09-23 DIAGNOSIS — I130 Hypertensive heart and chronic kidney disease with heart failure and stage 1 through stage 4 chronic kidney disease, or unspecified chronic kidney disease: Secondary | ICD-10-CM | POA: Diagnosis not present

## 2019-09-23 DIAGNOSIS — Z20822 Contact with and (suspected) exposure to covid-19: Secondary | ICD-10-CM | POA: Insufficient documentation

## 2019-09-23 DIAGNOSIS — Z794 Long term (current) use of insulin: Secondary | ICD-10-CM | POA: Insufficient documentation

## 2019-09-23 DIAGNOSIS — Z7982 Long term (current) use of aspirin: Secondary | ICD-10-CM | POA: Diagnosis not present

## 2019-09-23 DIAGNOSIS — Z79899 Other long term (current) drug therapy: Secondary | ICD-10-CM | POA: Insufficient documentation

## 2019-09-23 DIAGNOSIS — I509 Heart failure, unspecified: Secondary | ICD-10-CM | POA: Insufficient documentation

## 2019-09-23 DIAGNOSIS — Z87891 Personal history of nicotine dependence: Secondary | ICD-10-CM | POA: Diagnosis not present

## 2019-09-23 DIAGNOSIS — E1122 Type 2 diabetes mellitus with diabetic chronic kidney disease: Secondary | ICD-10-CM | POA: Diagnosis not present

## 2019-09-23 LAB — BASIC METABOLIC PANEL
Anion gap: 11 (ref 5–15)
BUN: 33 mg/dL — ABNORMAL HIGH (ref 6–20)
CO2: 26 mmol/L (ref 22–32)
Calcium: 7.9 mg/dL — ABNORMAL LOW (ref 8.9–10.3)
Chloride: 100 mmol/L (ref 98–111)
Creatinine, Ser: 3.02 mg/dL — ABNORMAL HIGH (ref 0.61–1.24)
GFR calc Af Amer: 26 mL/min — ABNORMAL LOW (ref >60)
GFR calc non Af Amer: 22 mL/min — ABNORMAL LOW (ref >60)
Glucose, Bld: 317 mg/dL — ABNORMAL HIGH (ref 70–99)
Potassium: 4.7 mmol/L (ref 3.5–5.1)
Sodium: 137 mmol/L (ref 135–145)

## 2019-09-23 LAB — SARS CORONAVIRUS 2 BY RT PCR (HOSPITAL ORDER, PERFORMED IN ~~LOC~~ HOSPITAL LAB): SARS Coronavirus 2: NEGATIVE

## 2019-09-23 LAB — URINALYSIS, ROUTINE W REFLEX MICROSCOPIC
Bilirubin Urine: NEGATIVE
Glucose, UA: >=500 mg/dL — AB
Ketones, ur: NEGATIVE mg/dL
Leukocytes,Ua: NEGATIVE
Nitrite: NEGATIVE
Protein, ur: >=300 mg/dL — AB
RBC / HPF: >50 RBC/hpf — ABNORMAL HIGH (ref 0–5)
Specific Gravity, Urine: 1.012 (ref 1.005–1.030)
pH: 6 (ref 5.0–8.0)

## 2019-09-23 LAB — POCT I-STAT 7, (LYTES, BLD GAS, ICA,H+H)
Acid-base deficit: 2 mmol/L (ref 0.0–2.0)
Bicarbonate: 23.7 mmol/L (ref 20.0–28.0)
Calcium, Ion: 1.12 mmol/L — ABNORMAL LOW (ref 1.15–1.40)
HCT: 54 % — ABNORMAL HIGH (ref 39.0–52.0)
Hemoglobin: 18.4 g/dL — ABNORMAL HIGH (ref 13.0–17.0)
O2 Saturation: 93 %
Patient temperature: 97.7
Potassium: 4.5 mmol/L (ref 3.5–5.1)
Sodium: 135 mmol/L (ref 135–145)
TCO2: 25 mmol/L (ref 22–32)
pCO2 arterial: 43 mmHg (ref 32.0–48.0)
pH, Arterial: 7.346 — ABNORMAL LOW (ref 7.350–7.450)
pO2, Arterial: 68 mmHg — ABNORMAL LOW (ref 83.0–108.0)

## 2019-09-23 LAB — CBC
HCT: 54.1 % — ABNORMAL HIGH (ref 39.0–52.0)
Hemoglobin: 17 g/dL (ref 13.0–17.0)
MCH: 27.5 pg (ref 26.0–34.0)
MCHC: 31.4 g/dL (ref 30.0–36.0)
MCV: 87.5 fL (ref 80.0–100.0)
Platelets: 330 10*3/uL (ref 150–400)
RBC: 6.18 MIL/uL — ABNORMAL HIGH (ref 4.22–5.81)
RDW: 14 % (ref 11.5–15.5)
WBC: 9.3 10*3/uL (ref 4.0–10.5)
nRBC: 0 % (ref 0.0–0.2)

## 2019-09-23 LAB — GLOMERULAR BASEMENT MEMBRANE ANTIBODIES: GBM Ab: 4 units (ref 0–20)

## 2019-09-23 LAB — ANCA TITERS
Atypical P-ANCA titer: <1:20 {titer}
C-ANCA: <1:20 {titer}
P-ANCA: <1:20 {titer}

## 2019-09-23 LAB — BRAIN NATRIURETIC PEPTIDE: B Natriuretic Peptide: 551.5 pg/mL — ABNORMAL HIGH (ref 0.0–100.0)

## 2019-09-23 MED ORDER — TECHNETIUM TO 99M ALBUMIN AGGREGATED
3.9500 | Freq: Once | INTRAVENOUS | Status: AC | PRN
  Administered 2019-09-23: 3.95 via INTRAVENOUS

## 2019-09-23 NOTE — ED Triage Notes (Signed)
Patient arrives to ED with complaints of shortness of breath while laying flat at night. Per patient wife, pt wakes up in the middle of the night gasping for air that has gotten worse since he was d/c from hospital. Per wife last time pt was SOB at night he ended up having a stroke. Pt was admitted for CVA and confusion on 7/28. Patients wife states patients confusion has not gotten better. Pt on room air, NAD, and with wife in triage.

## 2019-09-23 NOTE — ED Notes (Signed)
Verbalized understanding of DC instructions and follow up care 

## 2019-09-23 NOTE — ED Provider Notes (Signed)
[facility] EMERGENCY DEPARTMENT Provider Note   CSN: [id number] Arrival date & time: 09/23/19  0805     History Chief Complaint  Patient presents with  . Shortness of Breath    Daniel [name] is a 54 y.o. male.  HPI 54 year old male with extensive medical history including CKD stage III due to DM type II, recent stroke with an admission on 6 7/28 for acute ischemic stroke and acute on chronic respiratory failure with on the proximal ER, OSA, CHF with an EF of 40-45% presents to the ER with his wife with worsening orthopnea.  Wife reports that the patient has been having worsening spells at night when he is sleeping where he wakes up.  Gasping for air and slightly disoriented.  Patient states that he is compliant with his CPAP.  He has a pulmonologist in [place] but has not seen him in quite some time.  She also reports that his confusion has not improved since the stroke.  No new or worsening symptoms however.  He denies any current chest pain or shortness of breath, however is visibly using pursed lip breathing in the ED.  No fevers, chills, dysuria, abdominal pain, diarrhea, constipation.  He has not started any of the medications that he was discharged on, wife states that she picked up the medication today.  She states that she had a follow-up appointment with his PCP today, but decided to bring him to the ER instead.    Past Medical History:  Diagnosis Date  . Arthritis    "elbows and wrists" (11/13/2017)  . CHF (congestive heart failure) ([place]) 08/2016  . CKD stage 3 due to type 2 diabetes mellitus ([place]) 06/2016  . Diabetes mellitus type 2 in obese ([place])   . High cholesterol   . Hypertension   . Hypothyroidism   . Left foot drop 04/12/2018  . Migraines   . Morbid obesity ([place])   . On home oxygen therapy    "3L; now only at night" (11/13/2017)  . OSA on CPAP   . Pneumonia 2017   "double pneumonia"  . Pneumonia 10/2017   "while in ICU" (11/13/2017)  .  Pulmonary embolism ([place])   . Seizure ([place])    "10/24/2017-10/28/2017; couldn't get them stopped til medically induced coma. after they brought him out he started having more; can't find RX to stop them" (11/13/2017)  . Stroke [facility])    "scans showed several mini strokes in the past; nothing recent" (11/13/2017)    Patient Active Problem List   Diagnosis Date Noted  . CVA (cerebral vascular accident) ([place]) 09/18/2019  . Acute metabolic encephalopathy 07/37/1062  . Volume depletion   . Hyperglycemia 08/18/2019  . Left foot drop 04/12/2018  . Abnormality of gait 12/14/2017  . Labile blood glucose   . Sleep disturbance   . Peripheral edema   . Hyponatremia   . Stage 2 chronic kidney disease   . Debility 11/22/2017  . Obesity, Class III, BMI 40-49.9 (morbid obesity) ([place])   . Seizures ([place])   . OSA (obstructive sleep apnea)   . Noncompliance   . FUO (fever of unknown origin)   . Acute on chronic kidney failure ([place])   . Diabetes mellitus type 2 in nonobese (H[facility])   . Diabetes mellitus type 2 in obese ([place])   . Acute blood loss anemia   . Labile blood pressure   . Fever   . Status epilepticus ([place])   . Recurrent seizures ([place]) 10/27/2017  .  Altered sensorium 10/27/2017  . Dyspnea on exertion 08/14/2017  . Chronic respiratory failure with hypoxia ([place]) 08/14/2017  . Sleep apnea 08/14/2017  . Obesity hypoventilation syndrome ([place]) 08/14/2017  . Fatigue 08/14/2017  . Hypertension 08/14/2017  . Hyperlipidemia 12/23/2016  . Hypothyroid 12/23/2016  . Acute combined systolic and diastolic congestive heart failure ([place]) 06/24/2016  . CKD (chronic kidney disease), stage III 06/24/2016  . Type 2 diabetes mellitus without complications ([place]) 68/01/7516    Past Surgical History:  Procedure Laterality Date  . CARDIAC CATHETERIZATION  12/2016       Family History  Problem Relation Age of Onset  . Glaucoma Mother   . Diabetes Maternal Grandmother   . Diabetes Maternal Grandfather   .  Alpha-1 antitrypsin deficiency Neg Hx   . Asthma Neg Hx   . Breast cancer Neg Hx   . Stroke Neg Hx   . Colon cancer Neg Hx   . Coronary artery disease Neg Hx   . COPD Neg Hx   . Cystic fibrosis Neg Hx   . Deep vein thrombosis Neg Hx   . Hypertension Neg Hx   . Hyperlipidemia Neg Hx   . Kidney disease Neg Hx   . Liver disease Neg Hx   . Lung cancer Neg Hx   . Lupus Neg Hx   . Neurofibromatosis Neg Hx   . Osteoporosis Neg Hx   . Ovarian cancer Neg Hx   . Positive PPD/TB Exposure Neg Hx   . Prostate cancer Neg Hx   . Pulmonary embolism Neg Hx   . Pulmonary fibrosis Neg Hx   . Rheum arthritis Neg Hx   . Sarcoidosis Neg Hx   . Sleep apnea Neg Hx   . Thyroid disease Neg Hx   . Tuberculosis Neg Hx   . Tuberous sclerosis Neg Hx     Social History   Tobacco Use  . Smoking status: Never Smoker  . Smokeless tobacco: Former Systems developer    Types: Secondary school teacher  . Vaping Use: Never used  Substance Use Topics  . Alcohol use: Not Currently  . Drug use: Never    Home Medications Prior to Admission medications   Medication Sig Start Date End Date Taking? Authorizing Provider  acetaminophen (TYLENOL) 325 MG tablet Take 1-2 tablets (325-650 mg total) by mouth every 4 (four) hours as needed for mild pain. 11/29/17  Yes Love, Ivan Anchors, PA-C  albuterol (PROVENTIL HFA) 108 (90 Base) MCG/ACT inhaler Inhale 1-2 puffs into the lungs See admin instructions. Inhale 1-2 puffs intot he lungs 4-6   Yes [provider]  aspirin 325 MG tablet Take 1 tablet (325 mg total) by mouth daily. Stop taking after 19 days 09/22/19  Yes Adhikari, Tamsen Meek, MD  citalopram (CELEXA) 20 MG tablet Take 20 mg by mouth daily.   Yes [provider]  insulin detemir (LEVEMIR) 100 UNIT/ML injection Inject 60 Units into the skin 2 (two) times daily before a meal.    Yes [provider]  NOVOLOG FLEXPEN 100 UNIT/ML FlexPen Inject 2-14 Units into the skin 3 (three) times daily with meals. INJECT 3 TIMES  DAILY WITH MEALS FOR GLUCOSE 121-150 USE 2 UNIT, 151-200 USE 3 UNIT, 201-250 USE 5 UNIT, 251-200 USE 7 UNIT, 301-350 10 UNIT, 351 & UP 14 UNITS 08/19/19  Yes [provider]  atorvastatin (LIPITOR) 80 MG tablet Take 1 tablet (80 mg total) by mouth at bedtime. Patient not taking: Reported on 09/23/2019 09/21/19   Shelly Coss,  MD  clopidogrel (PLAVIX) 75 MG tablet Take 1 tablet (75 mg total) by mouth daily. 09/22/19   Shelly Coss, MD  ezetimibe (ZETIA) 10 MG tablet Take 1 tablet (10 mg total) by mouth daily. 09/22/19   Shelly Coss, MD  gabapentin (NEURONTIN) 100 MG capsule Take 1 capsule (100 mg total) by mouth 3 (three) times daily. 12/01/17   Love, Ivan Anchors, PA-C  INSULIN ASPART PROT & ASPART [place] Inject 60 Units into the skin in the morning and at bedtime. Patient not taking: Reported on 09/23/2019    [provider]  Insulin Pen Needle (CAREFINE PEN NEEDLES) 32G X 6 MM MISC 1 application by Does not apply route 2 (two) times daily. 12/01/17   Love, Ivan Anchors, PA-C  levothyroxine (SYNTHROID, LEVOTHROID) 50 MCG tablet Take 1 tablet (50 mcg total) by mouth daily. 12/01/17   Love, Ivan Anchors, PA-C  metoprolol tartrate (LOPRESSOR) 25 MG tablet Take 1 tablet (25 mg total) by mouth 2 (two) times daily. 09/21/19   Shelly Coss, MD    Allergies    Montelukast  Review of Systems   Review of Systems  Constitutional: Negative for chills and fever.  HENT: Negative for ear pain and sore throat.   Eyes: Negative for pain and visual disturbance.  Respiratory: Positive for apnea and shortness of breath. Negative for cough.   Cardiovascular: Negative for chest pain and palpitations.  Gastrointestinal: Negative for abdominal pain and vomiting.  Genitourinary: Negative for dysuria and hematuria.  Musculoskeletal: Negative for arthralgias and back pain.  Skin: Negative for color change and rash.  Neurological: Negative for seizures and syncope.  All other systems reviewed and are  negative.   Physical Exam Updated Vital Signs BP 125/82   Pulse 68   Temp 99.3 F (37.4 C) (Oral)   Resp 12   SpO2 98%   Physical Exam Vitals and nursing note reviewed.  Constitutional:      General: He is not in acute distress.    Appearance: He is well-developed. He is obese. He is not ill-appearing or diaphoretic.  HENT:     Head: Normocephalic and atraumatic.  Eyes:     Conjunctiva/sclera: Conjunctivae normal.  Cardiovascular:     Rate and Rhythm: Normal rate and regular rhythm.     Heart sounds: No murmur heard.   Pulmonary:     Effort: Pulmonary effort is normal. No respiratory distress.     Breath sounds: Normal breath sounds. No decreased breath sounds or wheezing.     Comments: Pursed lip breathing  Chest:     Chest wall: No tenderness.  Abdominal:     Palpations: Abdomen is soft.     Tenderness: There is no abdominal tenderness.  Musculoskeletal:        General: Normal range of motion.     Cervical back: Neck supple.     Right lower leg: No edema.     Left lower leg: No edema.  Skin:    General: Skin is warm and dry.     Capillary Refill: Capillary refill takes less than 2 seconds.     Findings: No erythema or rash.  Neurological:     General: No focal deficit present.     Mental Status: He is alert.     Cranial Nerves: No cranial nerve deficit.     Motor: No weakness.     Comments: Patient alert to self, states that the year is either 2021 or 2022, states that he is in some kind  of doctor's office. Wife reports this is baseline.   He follows two-step commands without difficulty.  No slurred speech.  Moving all 4 extremities without difficulty.  Psychiatric:        Mood and Affect: Mood normal.        Behavior: Behavior normal.     ED Results / Procedures / Treatments   Labs (all labs ordered are listed, but only abnormal results are displayed) Labs Reviewed  BASIC METABOLIC PANEL - Abnormal; Notable for the following components:      Result Value    Glucose, Bld 317 (*)    BUN 33 (*)    Creatinine, Ser 3.02 (*)    Calcium 7.9 (*)    GFR calc non Af Amer 22 (*)    GFR calc Af Amer 26 (*)    All other components within normal limits  CBC - Abnormal; Notable for the following components:   RBC 6.18 (*)    HCT 54.1 (*)    All other components within normal limits  BRAIN NATRIURETIC PEPTIDE - Abnormal; Notable for the following components:   B Natriuretic Peptide 551.5 (*)    All other components within normal limits  URINALYSIS, ROUTINE W REFLEX MICROSCOPIC - Abnormal; Notable for the following components:   APPearance HAZY (*)    Glucose, UA >=500 (*)    Hgb urine dipstick MODERATE (*)    Protein, ur >=300 (*)    RBC / HPF >50 (*)    Bacteria, UA RARE (*)    All other components within normal limits  SARS CORONAVIRUS 2 BY RT PCR (HOSPITAL ORDER, [facility])  URINE CULTURE    EKG EKG Interpretation  Date/Time:  Monday September 23 2019 10:05:14 EDT Ventricular Rate:  69 PR Interval:    QRS Duration: 106 QT Interval:  444 QTC Calculation: 476 R Axis:   -51 Text Interpretation: Sinus rhythm LAD, consider left anterior fascicular block No STEMI Confirmed by Octaviano Glow [phone]) on 09/23/2019 10:43:16 AM   Radiology NM Pulmonary Perfusion  Result Date: 09/23/2019 CLINICAL DATA:  Concern for pulmonary embolism EXAM: NUCLEAR MEDICINE PERFUSION LUNG SCAN TECHNIQUE: Perfusion images were obtained in multiple projections after intravenous injection of radiopharmaceutical. Ventilation scans intentionally deferred if perfusion scan and chest x-ray adequate for interpretation during COVID 19 epidemic. RADIOPHARMACEUTICALS:  3.95 mCi Tc-81m MAA IV COMPARISON:  Same day chest radiograph, December 29, 2016 FINDINGS: Homogeneous distribution of radiotracer. Elevation of the RIGHT hemidiaphragm. Cardiomegaly. No suspicious wedge shaped perfusion defects. IMPRESSION: Pulmonary embolism absent. Electronically Signed    By: Valentino Saxon MD   On: 09/23/2019 15:45   DG Chest Portable 1 View  Result Date: 09/23/2019 CLINICAL DATA:  Shortness of breath EXAM: PORTABLE CHEST 1 VIEW COMPARISON:  September 19, 2019 FINDINGS: Lungs are clear. Heart is upper normal in size with pulmonary vascularity normal. No adenopathy. No bone lesions. IMPRESSION: Lungs clear.  Heart upper normal in size.  No adenopathy. Electronically Signed   By: Lowella Grip III M.D.   On: 09/23/2019 10:05    Procedures Procedures (including critical care time)  Medications Ordered in ED Medications  technetium albumin aggregated (MAA) injection solution 4.40 millicurie (1.02 millicuries Intravenous Contrast Given 09/23/19 1345)    ED Course  I have reviewed the triage vital signs and the nursing notes.  Pertinent labs & imaging results that were available during my care of the patient were reviewed by me and considered in my medical decision making (see  chart for details).    MDM Rules/Calculators/A&P                         CC: 54 year old male who with worsening episodes of orthopnea since his hospitalization for a acute ischemic stroke, discharged on 7/31 On presentation, the patient is alert, oriented to self, thinks the year is 2021 or 2022, states that he is in some kind of doctor's office.  He is exhibiting pursed lip breathing, however no respiratory distress, tachypnea.  No focal neuro deficits noted.  Patient's wife denies any new neurologic symptoms, no worsening confusion.  Patient has remained baseline since the stroke.   VS:  Vital signs overall reassuring, he is not hypoxic, or tachycardic.  Stable blood pressure.  No rales or rhonchi on exam, no significant lower extremity edema noted.  Abdomen soft and nontender, though bloated.  DDX: CHF exacerbation, COPD, PE, pneumonia, pneumothorax  LABS: CBC without leukocytosis, normal hemoglobin.  Normal platelets. BMP without any electrolyte abnormalities, glucose of 317,  creatinine/BUN elevated at 3.02 and 33 respectively, with a GFR of 22.  This appears to be more or less consistent with his previous lab values and his CKD stage III diagnosis.   UA with moderate hemoglobin which seems to be consistent with his previous UAs.  Rare bacteria, glucose more than 500 and proteinuria noted.  Will send for culture.  Patient denies any dysuria or urinary symptoms.  Will hold treatment for now pending culture. BNP at 551 which is unchanged from his numbers at his admission Covid negative  EKG: Normal sinus rhythm, no significant changes from previous  IMAGING: CXR without evidence of fluid overload.  No evidence of pneumonia or pneumothorax.  Given the patient's episodes of orthopnea, PE was still on the differential.  Patient was seen and evaluated by Dr. Langston Masker who felt we should get a VQ scan given the patient's renal function.  VQ scan was without evidence of pulmonary embolism.  CONSULTS: None  MDM: Patient's work-up overall reassuring.  No evidence of PE given the negative VQ scan, chest x-ray without evidence of fluid overload, BNP not suggestive of acute CHF exacerbation.  No evidence of pneumonia.  Patient's vital signs are overall very reassuring, he is not hypoxic, tachypneic.  Likely has worsening OSA.  Patient needs reevaluation by pulmonology, which I expressed to the wife.  She is frustrated as the patient is not getting good sleep.  I encouraged her to call the patient's pulmonologist in [place] to schedule an appointment as soon as possible.  No evidence of acute neurologic deficits, although patient has not started his anticoagulation.  I do not think he needs any further brain imaging at this time.  Suspect his symptoms are due to worsening orthopnea, likely worsened by the stroke.  DISPOSITION: At this stage in the ED course, the patient has been medically screened and is stable for discharge with close follow-up with pulmonology.  I discussed the findings  of the work-up with the patient and his wife, who voiced understanding, reassured, are agreeable with the plan and voiced understanding.  Strict return precautions discussed.  Patient was seen and evaluated by Dr.Trifan who is agreeable to the above plan and discharge.   Final Clinical Impression(s) / ED Diagnoses Final diagnoses:  Orthopnea    Rx / DC Orders ED Discharge Orders    None       Lyndel Safe 09/23/19 1619    Wyvonnia Dusky,  MD 09/23/19 1806

## 2019-09-23 NOTE — Discharge Instructions (Addendum)
Your work-up today was overall reassuring.  Please make sure to follow-up with the pulmonary doctor in [facility] for further evaluation of your sleep apnea.  Please call them tomorrow to schedule to an appointment as soon as possible. Your scan today did not show any evidence of a clot in your lungs.  Please follow-up with your primary care doctor for possible stroke rehabilitation.  Please also make sure to start the medications prescribed to you at your discharge from the hospital as soon as possible.  Return to the ER if your symptoms worsen.

## 2019-09-23 NOTE — ED Notes (Signed)
Pt arrives back from [facility].

## 2019-09-23 NOTE — ED Notes (Signed)
Pt taken to [facility] 

## 2019-09-24 LAB — URINE CULTURE: Culture: NO GROWTH

## 2019-11-05 IMAGING — DX DG CHEST 1V PORT
1 series · 2 of 2 positions shown · non-contrast
Comparison: 10/24/2017

CLINICAL DATA: 52-year-old male with a history of line placement
and intubation

EXAM:
PORTABLE CHEST 1 VIEW

[Series 1: chest · 0.14mm/px · 2 of 2 slices shown]
[im 1/2]
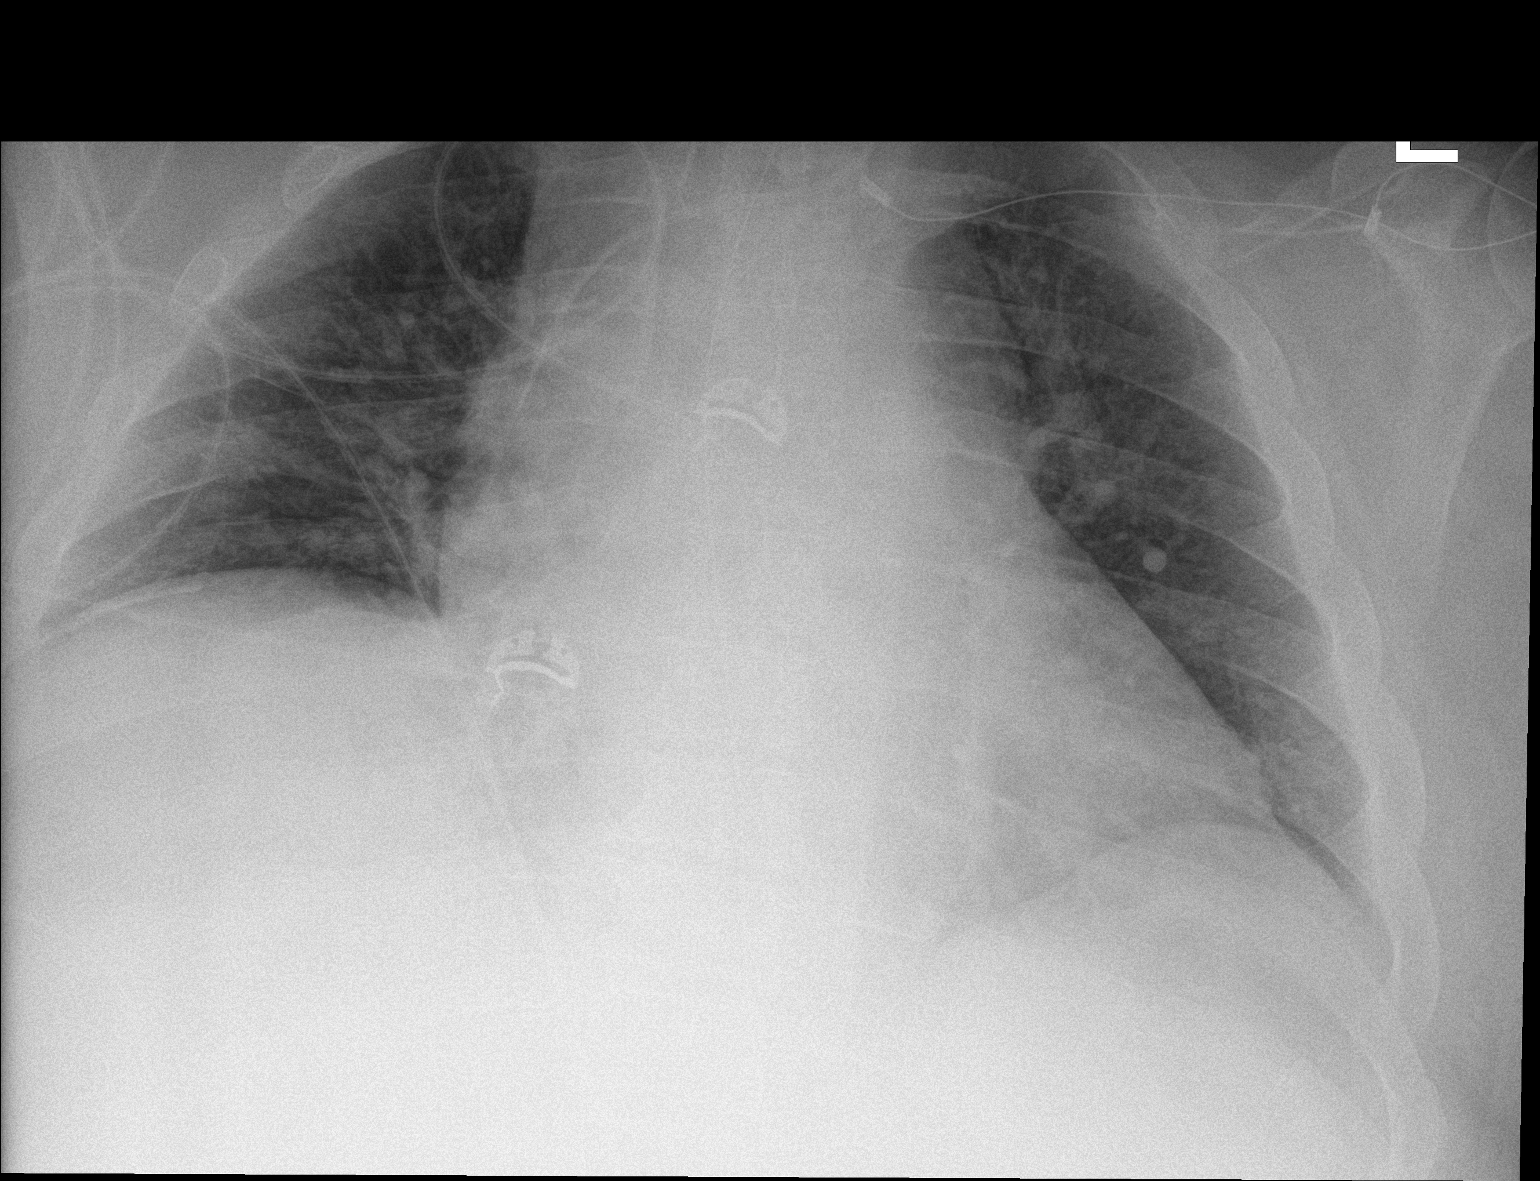
[im 2/2]
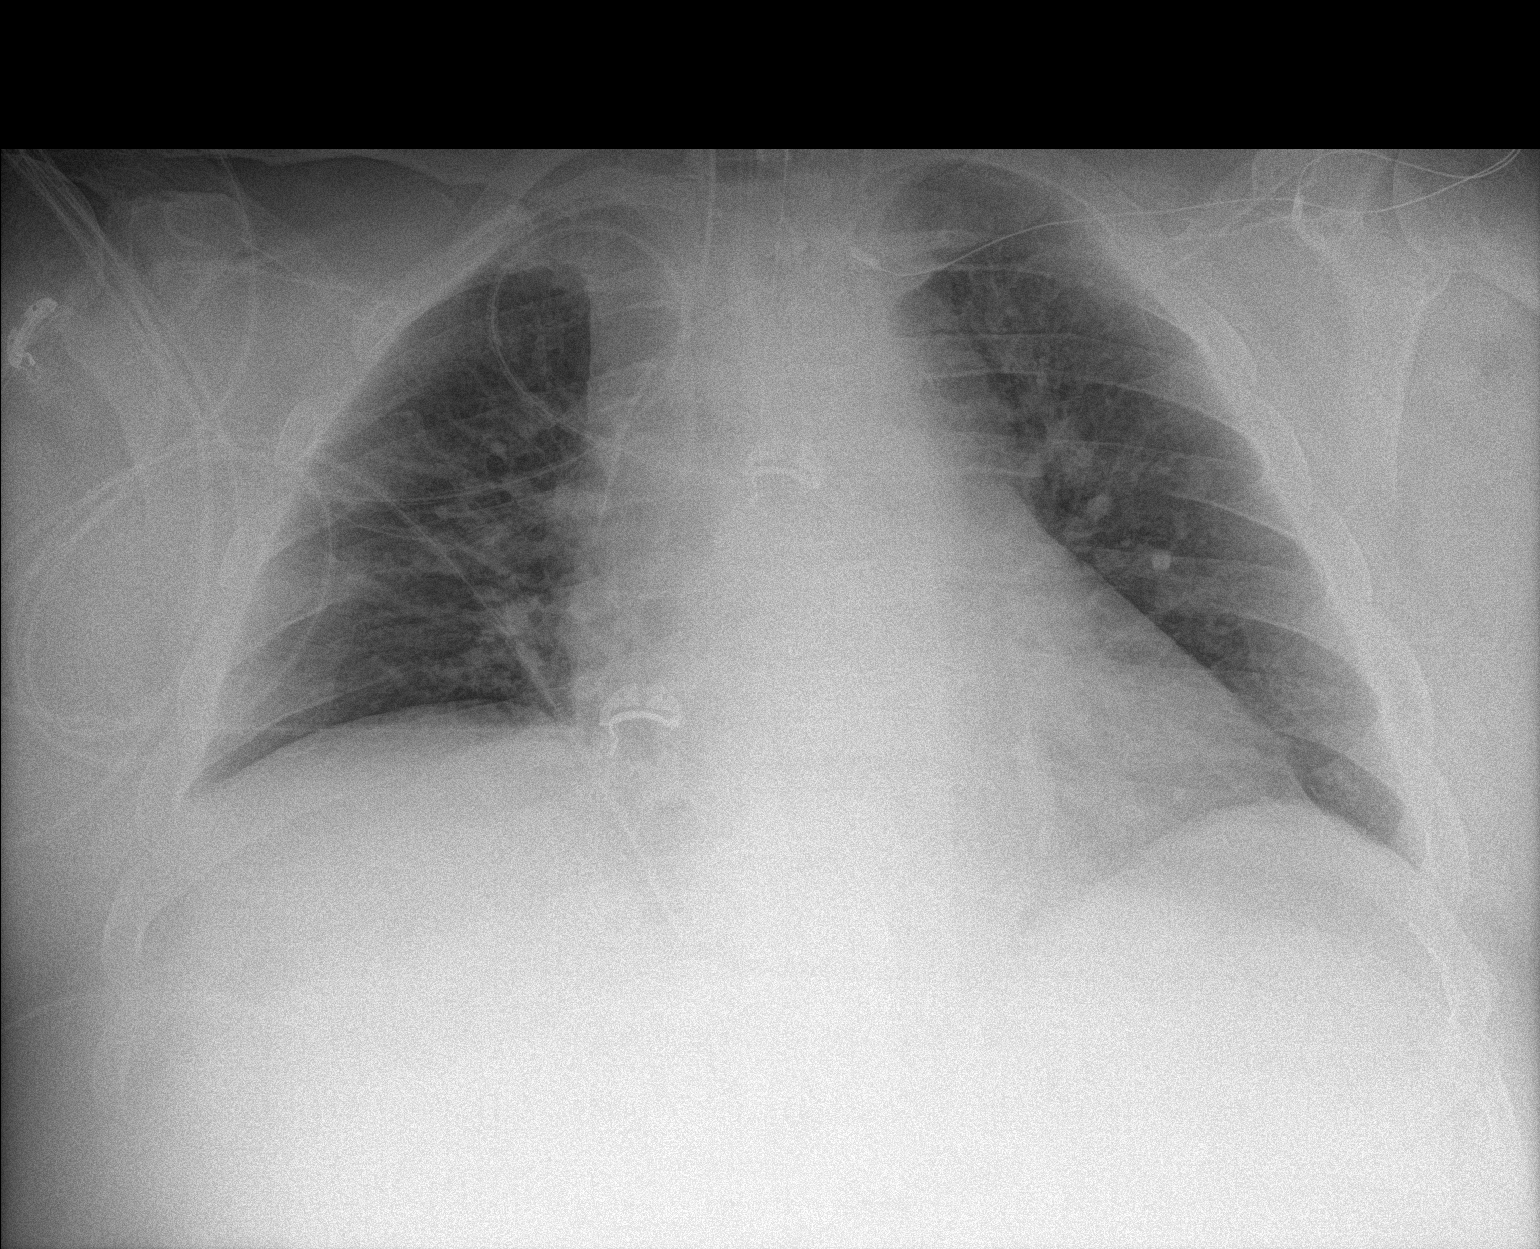

[2 of 2 positions shown; findings below may reference images not displayed]

FINDINGS: Cardiomediastinal silhouette unchanged with cardiomegaly.

Persistently low lung volumes with crowding of the interstitium.

No pneumothorax or new pleural effusion. Persistent right
hemidiaphragm elevation.

Interval placement of left IJ central venous catheter which appears
to terminate in the superior vena cava.

Interval placement of endotracheal tube, which terminates suitably
above the carina approximately 5.2 cm.

Interval placement of gastric tube which projects over the
mediastinum and terminates out of the field of view.
IMPRESSION: Endotracheal tube suitably position above the carina.

Interval placement of left IJ central venous catheter which appears
to terminate superior vena cava. No evidence of pneumothorax. New
gastric tube.

Low lung volumes with likely atelectasis.

## 2019-11-06 IMAGING — DX DG CHEST 1V PORT
1 series · 1 of 1 positions shown · non-contrast
Comparison: 10/28/2017

CLINICAL DATA: Intubation.

EXAM:
PORTABLE CHEST 1 VIEW

[chest ap]
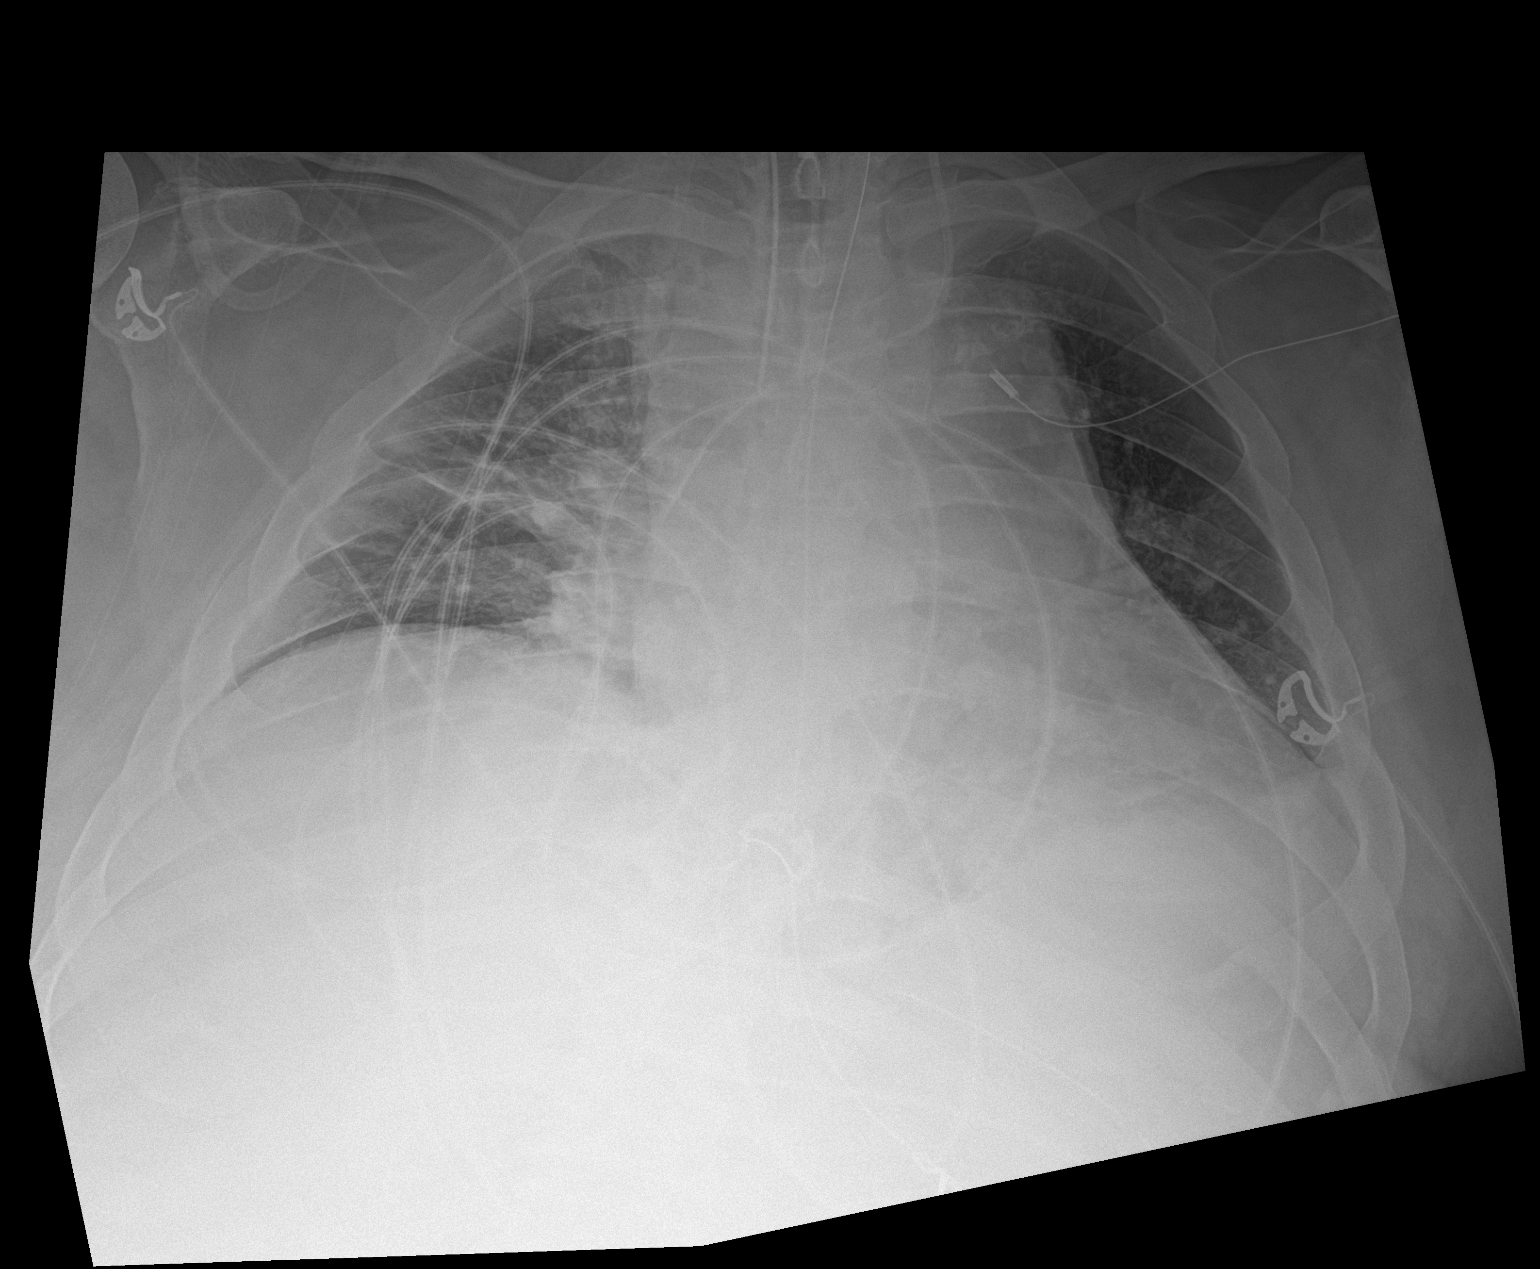

[1 of 1 positions shown; findings below may reference images not displayed]

FINDINGS: Endotracheal tube in satisfactory position. Left IJ approach central
venous catheter in stable position. Enteric catheter within the
expected location of gastric cardia.

Enlarged cardiac silhouette.

Low lung volumes with bilateral streaky airspace opacities.

Osseous structures are without acute abnormality. Soft tissues are
grossly normal.
IMPRESSION: Low lung volumes with bilateral streaky airspace opacities may
represent atelectasis or peribronchial airspace consolidation.

No evidence of pneumothorax.

## 2019-11-07 IMAGING — DX DG CHEST 1V PORT
1 series · 1 of 1 positions shown · non-contrast
Comparison: 10/29/2017.

CLINICAL DATA: Acute respiratory failure, endotracheal tube.

EXAM:
PORTABLE CHEST 1 VIEW

[chest ap]
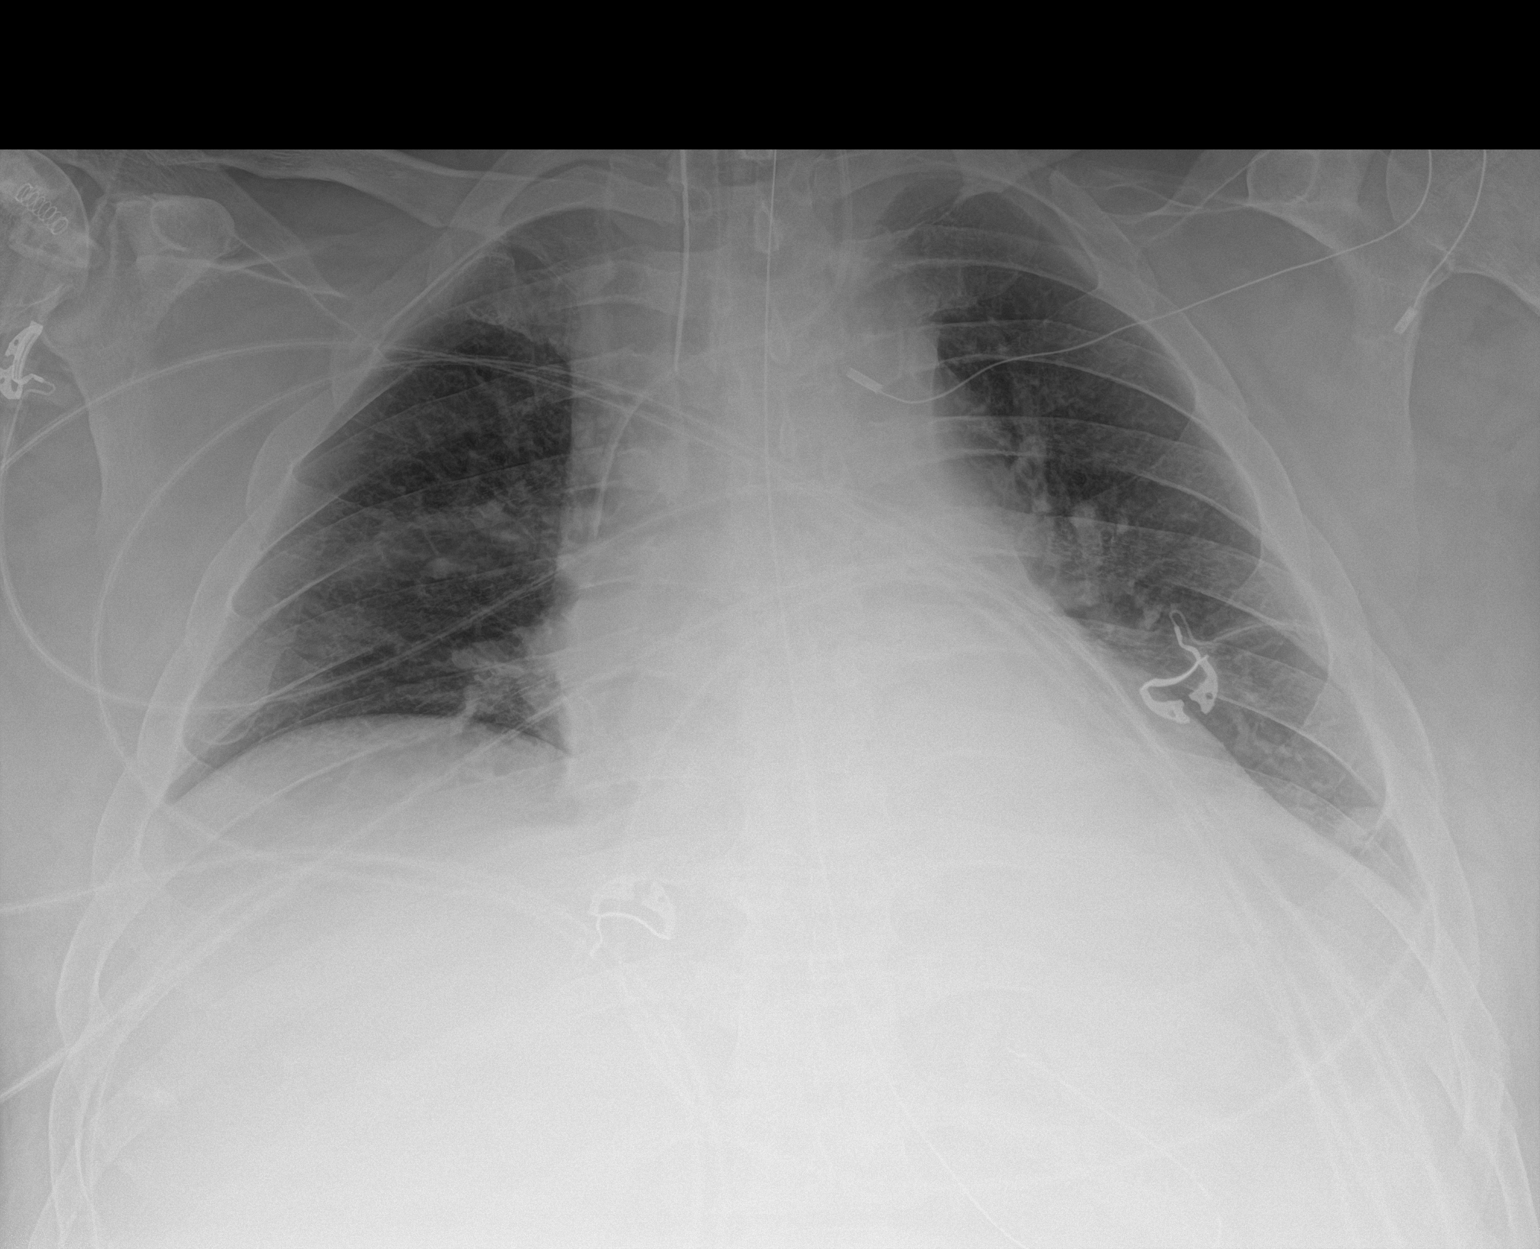

[1 of 1 positions shown; findings below may reference images not displayed]

FINDINGS: Endotracheal tube terminates 3.2 cm above the carina. Nasogastric
tube terminates in the stomach. Left IJ central line tip projects
over the SVC. Heart size stable. Lungs are low in volume with left
lower lobe airspace opacification. No definite pleural fluid.
IMPRESSION: Low lung volumes with left lower lobe airspace opacification,
possibly due to atelectasis. Difficult to exclude aspiration or
developing pneumonia.

## 2019-11-08 IMAGING — DX DG CHEST 1V PORT
1 series · 1 of 1 positions shown · non-contrast
Comparison: 10/30/2017, 10/29/2017 and earlier.

CLINICAL DATA: Ventilator dependent respiratory failure. Follow-up
atelectasis.

EXAM:
PORTABLE CHEST 1 VIEW

[chest]
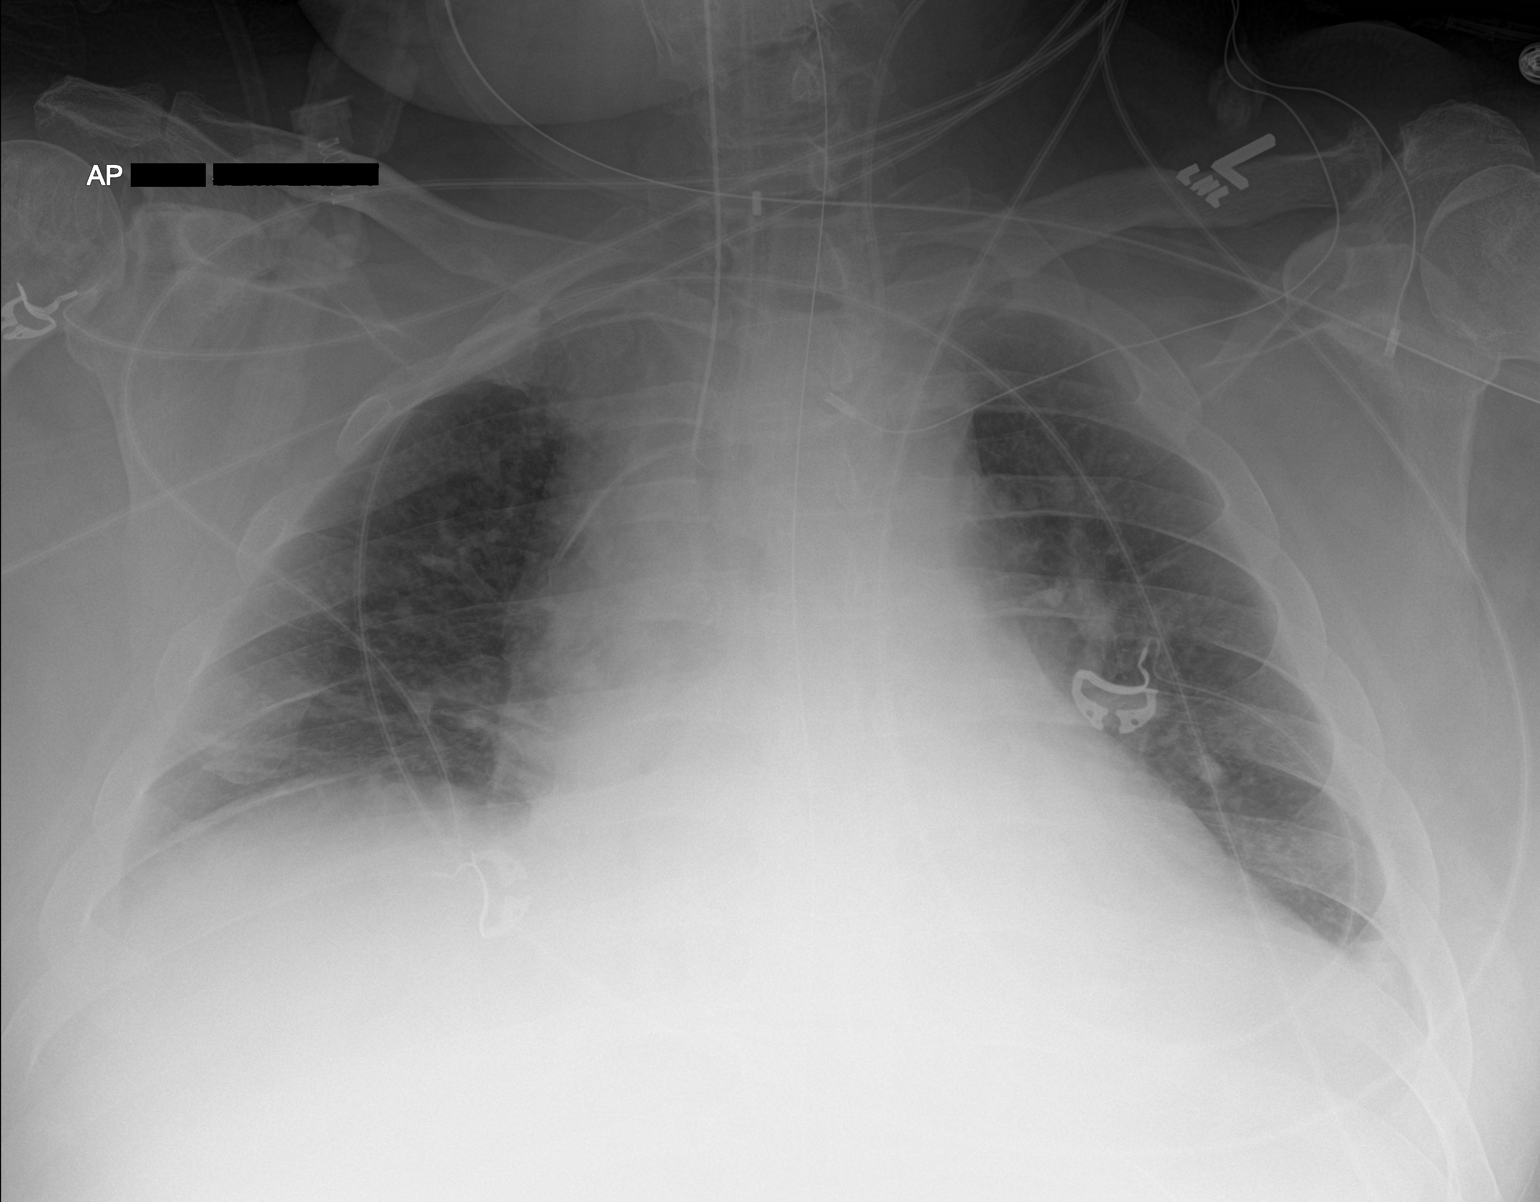

[1 of 1 positions shown; findings below may reference images not displayed]

FINDINGS: Endotracheal tube tip remains in satisfactory position projecting
approximately 5 cm above the carina. LEFT jugular central venous
catheter tip overlies the UPPER SVC. Nasogastric tube courses below
the diaphragm into the stomach.

Cardiac silhouette moderately enlarged, unchanged. Markedly
suboptimal inspiration which accounts for bibasilar atelectasis,
LEFT greater than RIGHT, increased since yesterday. No new pulmonary
parenchymal abnormalities elsewhere. Mild pulmonary venous
hypertension without overt edema.
IMPRESSION: 1.  Support apparatus satisfactory.
2. Markedly suboptimal inspiration which accounts for bibasilar
atelectasis, LEFT greater than RIGHT, increased since yesterday.
3. No new abnormalities otherwise.

## 2019-11-10 IMAGING — DX DG CHEST 1V PORT
1 series · 1 of 1 positions shown · non-contrast
Comparison: 11/01/2017

CLINICAL DATA: Respiratory failure

EXAM:
PORTABLE CHEST 1 VIEW

[chest]
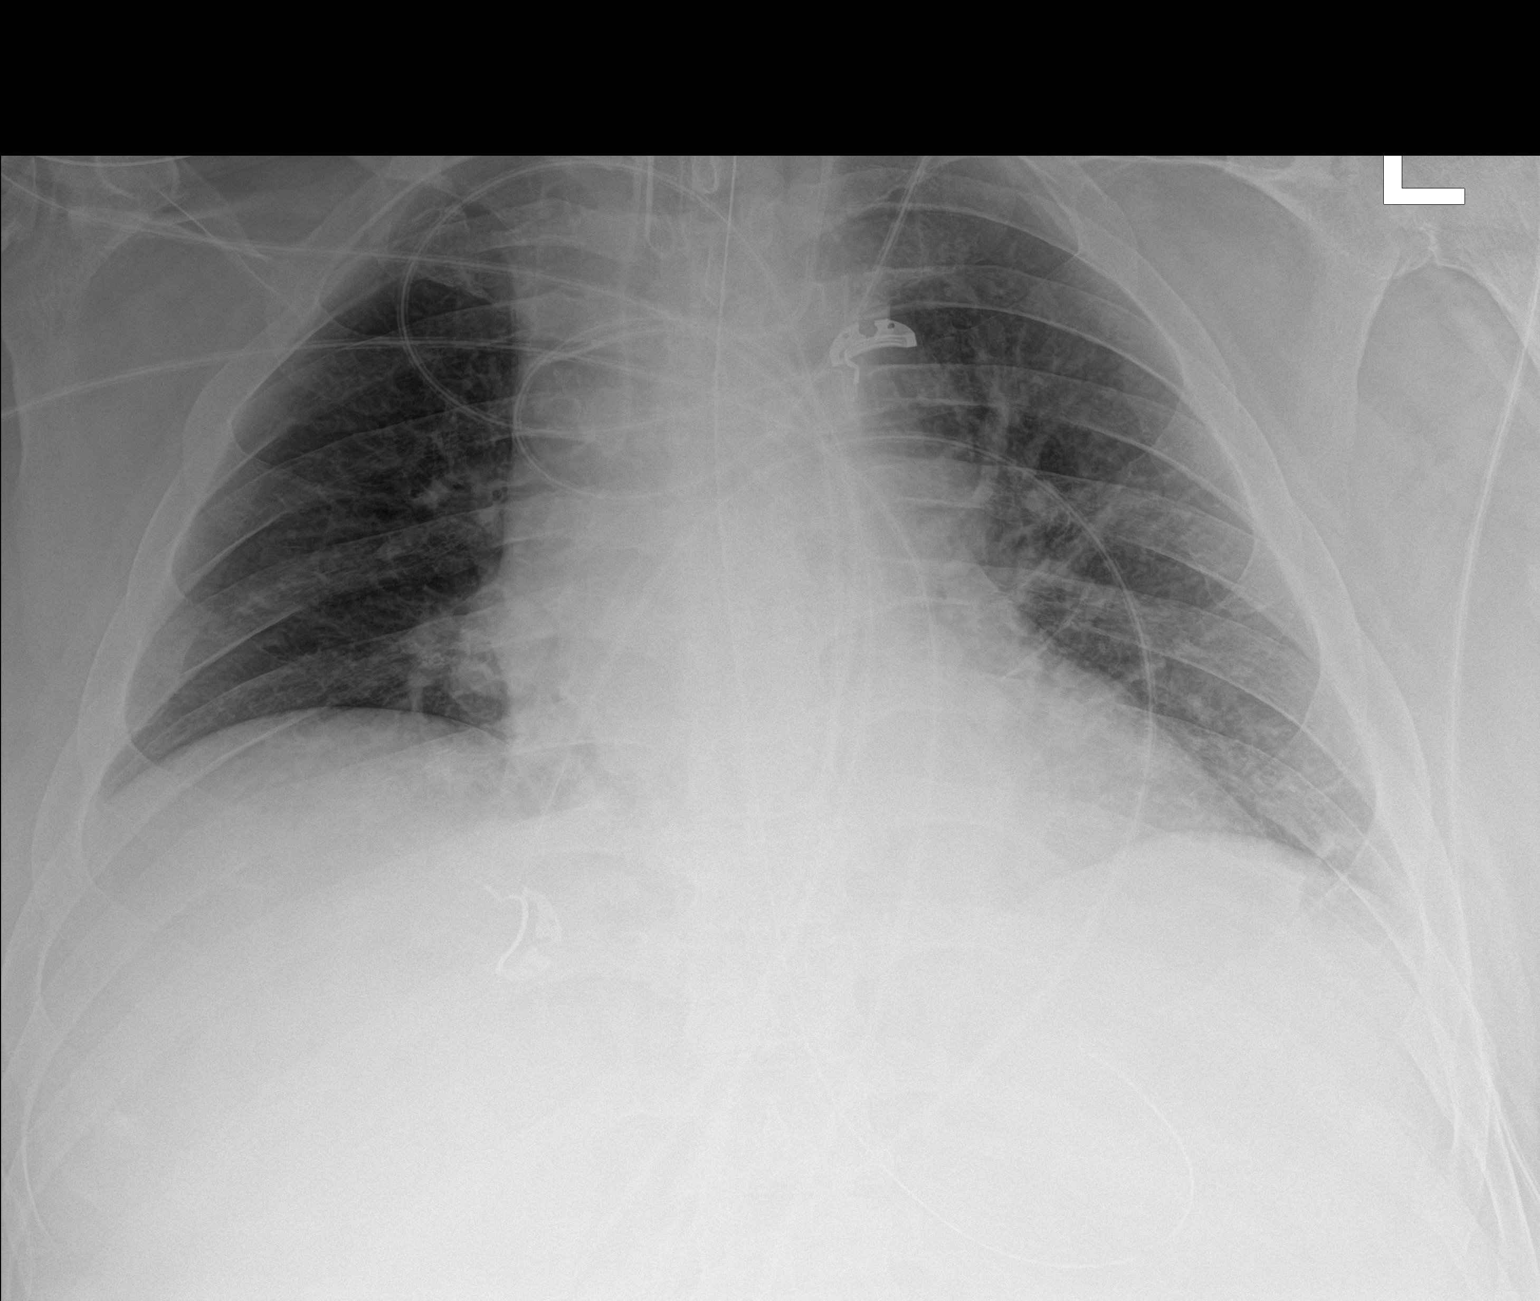

[1 of 1 positions shown; findings below may reference images not displayed]

FINDINGS: Endotracheal tube, left central line and NG tube remain in place,
unchanged. Mild elevation of the right hemidiaphragm, stable. Mild
cardiomegaly and vascular congestion. No confluent airspace
opacities or effusions.
IMPRESSION: Cardiomegaly, vascular congestion. Stable mild elevation of the
right hemidiaphragm.

## 2020-04-27 DIAGNOSIS — I5023 Acute on chronic systolic (congestive) heart failure: Secondary | ICD-10-CM

## 2020-04-28 DIAGNOSIS — G4733 Obstructive sleep apnea (adult) (pediatric): Secondary | ICD-10-CM | POA: Diagnosis not present

## 2020-04-28 DIAGNOSIS — I5042 Chronic combined systolic (congestive) and diastolic (congestive) heart failure: Secondary | ICD-10-CM

## 2020-04-28 DIAGNOSIS — N183 Chronic kidney disease, stage 3 unspecified: Secondary | ICD-10-CM

## 2020-04-28 DIAGNOSIS — I2510 Atherosclerotic heart disease of native coronary artery without angina pectoris: Secondary | ICD-10-CM

## 2020-04-28 DIAGNOSIS — I110 Hypertensive heart disease with heart failure: Secondary | ICD-10-CM

## 2020-04-29 DIAGNOSIS — I2510 Atherosclerotic heart disease of native coronary artery without angina pectoris: Secondary | ICD-10-CM | POA: Diagnosis not present

## 2020-04-29 DIAGNOSIS — I5042 Chronic combined systolic (congestive) and diastolic (congestive) heart failure: Secondary | ICD-10-CM | POA: Diagnosis not present

## 2020-04-29 DIAGNOSIS — G4733 Obstructive sleep apnea (adult) (pediatric): Secondary | ICD-10-CM | POA: Diagnosis not present

## 2020-04-29 DIAGNOSIS — I110 Hypertensive heart disease with heart failure: Secondary | ICD-10-CM | POA: Diagnosis not present

## 2020-04-30 DIAGNOSIS — I5042 Chronic combined systolic (congestive) and diastolic (congestive) heart failure: Secondary | ICD-10-CM | POA: Diagnosis not present

## 2020-04-30 DIAGNOSIS — G4733 Obstructive sleep apnea (adult) (pediatric): Secondary | ICD-10-CM | POA: Diagnosis not present

## 2020-04-30 DIAGNOSIS — I2510 Atherosclerotic heart disease of native coronary artery without angina pectoris: Secondary | ICD-10-CM | POA: Diagnosis not present

## 2020-04-30 DIAGNOSIS — I110 Hypertensive heart disease with heart failure: Secondary | ICD-10-CM | POA: Diagnosis not present

## 2020-05-01 DIAGNOSIS — I2510 Atherosclerotic heart disease of native coronary artery without angina pectoris: Secondary | ICD-10-CM | POA: Diagnosis not present

## 2020-05-01 DIAGNOSIS — G4733 Obstructive sleep apnea (adult) (pediatric): Secondary | ICD-10-CM | POA: Diagnosis not present

## 2020-05-01 DIAGNOSIS — I5042 Chronic combined systolic (congestive) and diastolic (congestive) heart failure: Secondary | ICD-10-CM | POA: Diagnosis not present

## 2020-05-01 DIAGNOSIS — I110 Hypertensive heart disease with heart failure: Secondary | ICD-10-CM | POA: Diagnosis not present

## 2020-05-02 DIAGNOSIS — I10 Essential (primary) hypertension: Secondary | ICD-10-CM

## 2020-05-02 DIAGNOSIS — E1165 Type 2 diabetes mellitus with hyperglycemia: Secondary | ICD-10-CM

## 2020-05-02 DIAGNOSIS — I2510 Atherosclerotic heart disease of native coronary artery without angina pectoris: Secondary | ICD-10-CM

## 2020-05-02 DIAGNOSIS — I5042 Chronic combined systolic (congestive) and diastolic (congestive) heart failure: Secondary | ICD-10-CM

## 2020-05-02 DIAGNOSIS — N183 Chronic kidney disease, stage 3 unspecified: Secondary | ICD-10-CM

## 2020-05-03 DIAGNOSIS — I5042 Chronic combined systolic (congestive) and diastolic (congestive) heart failure: Secondary | ICD-10-CM | POA: Diagnosis not present

## 2020-05-03 DIAGNOSIS — E1165 Type 2 diabetes mellitus with hyperglycemia: Secondary | ICD-10-CM | POA: Diagnosis not present

## 2020-05-03 DIAGNOSIS — I10 Essential (primary) hypertension: Secondary | ICD-10-CM | POA: Diagnosis not present

## 2020-05-03 DIAGNOSIS — I2510 Atherosclerotic heart disease of native coronary artery without angina pectoris: Secondary | ICD-10-CM | POA: Diagnosis not present

## 2020-08-21 DIAGNOSIS — I517 Cardiomegaly: Secondary | ICD-10-CM

## 2020-09-28 DIAGNOSIS — I110 Hypertensive heart disease with heart failure: Secondary | ICD-10-CM

## 2020-09-28 DIAGNOSIS — I129 Hypertensive chronic kidney disease with stage 1 through stage 4 chronic kidney disease, or unspecified chronic kidney disease: Secondary | ICD-10-CM

## 2020-09-28 DIAGNOSIS — Z9119 Patient's noncompliance with other medical treatment and regimen: Secondary | ICD-10-CM

## 2020-09-28 DIAGNOSIS — I5023 Acute on chronic systolic (congestive) heart failure: Secondary | ICD-10-CM

## 2020-09-29 DIAGNOSIS — Z9119 Patient's noncompliance with other medical treatment and regimen: Secondary | ICD-10-CM

## 2020-09-29 DIAGNOSIS — E1169 Type 2 diabetes mellitus with other specified complication: Secondary | ICD-10-CM

## 2020-09-29 DIAGNOSIS — N184 Chronic kidney disease, stage 4 (severe): Secondary | ICD-10-CM

## 2020-09-29 DIAGNOSIS — I5023 Acute on chronic systolic (congestive) heart failure: Secondary | ICD-10-CM

## 2020-09-29 DIAGNOSIS — I2510 Atherosclerotic heart disease of native coronary artery without angina pectoris: Secondary | ICD-10-CM

## 2020-09-29 DIAGNOSIS — I10 Essential (primary) hypertension: Secondary | ICD-10-CM

## 2020-09-30 DIAGNOSIS — N184 Chronic kidney disease, stage 4 (severe): Secondary | ICD-10-CM | POA: Diagnosis not present

## 2020-09-30 DIAGNOSIS — I5023 Acute on chronic systolic (congestive) heart failure: Secondary | ICD-10-CM | POA: Diagnosis not present

## 2020-09-30 DIAGNOSIS — Z9119 Patient's noncompliance with other medical treatment and regimen: Secondary | ICD-10-CM | POA: Diagnosis not present

## 2020-10-01 DIAGNOSIS — Z9119 Patient's noncompliance with other medical treatment and regimen: Secondary | ICD-10-CM | POA: Diagnosis not present

## 2020-10-01 DIAGNOSIS — I5023 Acute on chronic systolic (congestive) heart failure: Secondary | ICD-10-CM | POA: Diagnosis not present

## 2020-10-01 DIAGNOSIS — N184 Chronic kidney disease, stage 4 (severe): Secondary | ICD-10-CM | POA: Diagnosis not present

## 2020-10-02 DIAGNOSIS — N184 Chronic kidney disease, stage 4 (severe): Secondary | ICD-10-CM | POA: Diagnosis not present

## 2020-10-02 DIAGNOSIS — Z9119 Patient's noncompliance with other medical treatment and regimen: Secondary | ICD-10-CM | POA: Diagnosis not present

## 2020-10-02 DIAGNOSIS — I5023 Acute on chronic systolic (congestive) heart failure: Secondary | ICD-10-CM | POA: Diagnosis not present

## 2020-10-03 DIAGNOSIS — Z9119 Patient's noncompliance with other medical treatment and regimen: Secondary | ICD-10-CM

## 2020-10-03 DIAGNOSIS — I129 Hypertensive chronic kidney disease with stage 1 through stage 4 chronic kidney disease, or unspecified chronic kidney disease: Secondary | ICD-10-CM

## 2020-10-03 DIAGNOSIS — N184 Chronic kidney disease, stage 4 (severe): Secondary | ICD-10-CM

## 2020-10-03 DIAGNOSIS — E119 Type 2 diabetes mellitus without complications: Secondary | ICD-10-CM

## 2020-10-03 DIAGNOSIS — I5023 Acute on chronic systolic (congestive) heart failure: Secondary | ICD-10-CM

## 2020-10-04 DIAGNOSIS — I129 Hypertensive chronic kidney disease with stage 1 through stage 4 chronic kidney disease, or unspecified chronic kidney disease: Secondary | ICD-10-CM | POA: Diagnosis not present

## 2020-10-04 DIAGNOSIS — I5023 Acute on chronic systolic (congestive) heart failure: Secondary | ICD-10-CM | POA: Diagnosis not present

## 2020-10-04 DIAGNOSIS — N184 Chronic kidney disease, stage 4 (severe): Secondary | ICD-10-CM | POA: Diagnosis not present

## 2020-12-08 ENCOUNTER — Other Ambulatory Visit: Payer: Self-pay | Admitting: *Deleted

## 2021-04-02 NOTE — Unmapped External Note (Signed)
° °  Care Coordination Discharge Note  Confirmed with Tully at [facility] that pt can admit today. Pt's wife made aware of d/c. Called [facility] and arranged transport. Provided nurse with number to call report.        Patient:  Daniel [name].  MRN: [id number] DOB:  [birth year]  Service: Hospitalist Location: [id number]/[phone]   Inpatient  Disposition upon Discharge: Skilled Nursing Facility [facility] Certified)   Discharge Risk Factors: (HD) Dialysis Disposition upon Discharge: Skilled Nursing Facility [facility] Certified) Homecare/Hospice Agency(s) chosen: [facility] Placement Facility Type: Skilled Nursing Facility Skilled Nursing - Assisted Living Facility: Other out of county [facility]) Discharge transport: Ambulance Discharge transport agency chosen: [facility]  [phone])  Discharge Referrals Chosen geographical local area/county list shared with patient/family: Yes Post-acute provider form completed: Yes Case closed, patient/family in agreement with disposition plan: Yes                     Arlean Calender, MSW [phone]   Electronically signed by: Arlean Calender, MSW 04/02/21 [phone]

## 2021-04-21 DEATH — deceased
